# Patient Record
Sex: Male | Born: 1985 | Race: Black or African American | Hispanic: No | Smoking: Current some day smoker
Health system: Southern US, Community
[De-identification: ages and names within clinical notes are randomized; demographics above are authoritative.]

## PROBLEM LIST (undated history)

## (undated) ENCOUNTER — Emergency Department (HOSPITAL_COMMUNITY): Admission: EM | Payer: PRIVATE HEALTH INSURANCE | Source: Home / Self Care

## (undated) DIAGNOSIS — M549 Dorsalgia, unspecified: Secondary | ICD-10-CM

## (undated) DIAGNOSIS — J45909 Unspecified asthma, uncomplicated: Secondary | ICD-10-CM

## (undated) DIAGNOSIS — M542 Cervicalgia: Secondary | ICD-10-CM

## (undated) DIAGNOSIS — F99 Mental disorder, not otherwise specified: Secondary | ICD-10-CM

## (undated) DIAGNOSIS — M25562 Pain in left knee: Secondary | ICD-10-CM

## (undated) DIAGNOSIS — Z9889 Other specified postprocedural states: Secondary | ICD-10-CM

## (undated) DIAGNOSIS — F32A Depression, unspecified: Secondary | ICD-10-CM

## (undated) DIAGNOSIS — K219 Gastro-esophageal reflux disease without esophagitis: Secondary | ICD-10-CM

## (undated) DIAGNOSIS — Z720 Tobacco use: Secondary | ICD-10-CM

## (undated) DIAGNOSIS — N289 Disorder of kidney and ureter, unspecified: Secondary | ICD-10-CM

## (undated) DIAGNOSIS — F329 Major depressive disorder, single episode, unspecified: Secondary | ICD-10-CM

## (undated) DIAGNOSIS — N189 Chronic kidney disease, unspecified: Secondary | ICD-10-CM

## (undated) DIAGNOSIS — F1010 Alcohol abuse, uncomplicated: Secondary | ICD-10-CM

## (undated) DIAGNOSIS — R112 Nausea with vomiting, unspecified: Secondary | ICD-10-CM

## (undated) DIAGNOSIS — J189 Pneumonia, unspecified organism: Secondary | ICD-10-CM

## (undated) DIAGNOSIS — I10 Essential (primary) hypertension: Secondary | ICD-10-CM

## (undated) DIAGNOSIS — M1990 Unspecified osteoarthritis, unspecified site: Secondary | ICD-10-CM

## (undated) HISTORY — PX: OTHER SURGICAL HISTORY: SHX169

---
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
## 2006-10-21 ENCOUNTER — Emergency Department (HOSPITAL_COMMUNITY): Admission: EM | Admit: 2006-10-21 | Discharge: 2006-10-21 | Payer: Self-pay | Admitting: Emergency Medicine

## 2006-11-10 ENCOUNTER — Emergency Department (HOSPITAL_COMMUNITY): Admission: EM | Admit: 2006-11-10 | Discharge: 2006-11-10 | Payer: Self-pay | Admitting: Emergency Medicine

## 2008-04-02 ENCOUNTER — Ambulatory Visit: Payer: Self-pay | Admitting: Family Medicine

## 2008-04-02 ENCOUNTER — Encounter (INDEPENDENT_AMBULATORY_CARE_PROVIDER_SITE_OTHER): Payer: Self-pay | Admitting: Family Medicine

## 2008-04-02 DIAGNOSIS — F1010 Alcohol abuse, uncomplicated: Secondary | ICD-10-CM | POA: Insufficient documentation

## 2008-04-02 DIAGNOSIS — F329 Major depressive disorder, single episode, unspecified: Secondary | ICD-10-CM

## 2008-04-02 DIAGNOSIS — J45909 Unspecified asthma, uncomplicated: Secondary | ICD-10-CM

## 2008-04-02 DIAGNOSIS — I252 Old myocardial infarction: Secondary | ICD-10-CM

## 2008-04-02 LAB — CONVERTED CEMR LAB
ALT: 11 units/L (ref 0–53)
AST: 17 units/L (ref 0–37)
Albumin: 4.5 g/dL (ref 3.5–5.2)
Alkaline Phosphatase: 59 units/L (ref 39–117)
BUN: 10 mg/dL (ref 6–23)
CO2: 27 meq/L (ref 19–32)
Calcium: 10 mg/dL (ref 8.4–10.5)
Chloride: 103 meq/L (ref 96–112)
Cholesterol: 144 mg/dL (ref 0–200)
Creatinine, Ser: 0.84 mg/dL (ref 0.40–1.50)
Glucose, Bld: 79 mg/dL (ref 70–99)
HDL: 49 mg/dL (ref >39)
LDL Cholesterol: 83 mg/dL (ref 0–99)
Potassium: 3.8 meq/L (ref 3.5–5.3)
Sodium: 142 meq/L (ref 135–145)
Total Bilirubin: 1.1 mg/dL (ref 0.3–1.2)
Total CHOL/HDL Ratio: 2.9
Total Protein: 7.5 g/dL (ref 6.0–8.3)
Triglycerides: 62 mg/dL (ref <150)
VLDL: 12 mg/dL (ref 0–40)

## 2008-04-06 ENCOUNTER — Encounter (INDEPENDENT_AMBULATORY_CARE_PROVIDER_SITE_OTHER): Payer: Self-pay | Admitting: Family Medicine

## 2008-12-14 ENCOUNTER — Telehealth: Payer: Self-pay | Admitting: Family Medicine

## 2008-12-15 ENCOUNTER — Ambulatory Visit: Payer: Self-pay | Admitting: Family Medicine

## 2008-12-15 ENCOUNTER — Encounter: Payer: Self-pay | Admitting: Sports Medicine

## 2008-12-15 DIAGNOSIS — R609 Edema, unspecified: Secondary | ICD-10-CM

## 2008-12-15 LAB — CONVERTED CEMR LAB
ALT: 12 units/L (ref 0–53)
AST: 25 units/L (ref 0–37)
Albumin: 3.3 g/dL — ABNORMAL LOW (ref 3.5–5.2)
Alkaline Phosphatase: 58 U/L (ref 39–117)
BUN: 15 mg/dL (ref 6–23)
Basophils Absolute: 0 K/uL (ref 0.0–0.1)
Basophils Relative: 0 % (ref 0–1)
Bilirubin Urine: NEGATIVE
Blood in Urine, dipstick: NEGATIVE
CO2: 25 meq/L (ref 19–32)
Calcium: 9 mg/dL (ref 8.4–10.5)
Chloride: 106 meq/L (ref 96–112)
Creatinine, Ser: 1.53 mg/dL — ABNORMAL HIGH (ref 0.40–1.50)
Eosinophils Absolute: 0.1 10*3/uL (ref 0.0–0.7)
Eosinophils Relative: 1 % (ref 0–5)
Glucose, Bld: 79 mg/dL (ref 70–99)
Glucose, Urine, Semiquant: NEGATIVE
HCT: 38.7 % — ABNORMAL LOW (ref 39.0–52.0)
Hemoglobin: 13.1 g/dL (ref 13.0–17.0)
Ketones, urine, test strip: NEGATIVE
Lymphocytes Relative: 27 % (ref 12–46)
Lymphs Abs: 2.1 10*3/uL (ref 0.7–4.0)
MCHC: 33.9 g/dL (ref 30.0–36.0)
MCV: 89.4 fL (ref 78.0–100.0)
Monocytes Absolute: 0.8 K/uL (ref 0.1–1.0)
Monocytes Relative: 10 % (ref 3–12)
Neutro Abs: 4.7 K/uL (ref 1.7–7.7)
Neutrophils Relative %: 62 % (ref 43–77)
Nitrite: NEGATIVE
Platelets: 260 10*3/uL (ref 150–400)
Potassium: 4 meq/L (ref 3.5–5.3)
Protein, U semiquant: >=300
RBC: 4.33 M/uL (ref 4.22–5.81)
RDW: 14.2 % (ref 11.5–15.5)
Sodium: 142 meq/L (ref 135–145)
Specific Gravity, Urine: >=1.03
Total Bilirubin: 0.5 mg/dL (ref 0.3–1.2)
Total Protein: 6.2 g/dL (ref 6.0–8.3)
Urobilinogen, UA: 0.2
WBC Urine, dipstick: NEGATIVE
WBC: 7.6 10*3/uL (ref 4.0–10.5)
pH: 7

## 2008-12-18 ENCOUNTER — Encounter (INDEPENDENT_AMBULATORY_CARE_PROVIDER_SITE_OTHER): Payer: Self-pay | Admitting: *Deleted

## 2009-01-15 ENCOUNTER — Ambulatory Visit: Payer: Self-pay | Admitting: Family Medicine

## 2009-01-15 DIAGNOSIS — M549 Dorsalgia, unspecified: Secondary | ICD-10-CM | POA: Insufficient documentation

## 2009-01-15 DIAGNOSIS — M25569 Pain in unspecified knee: Secondary | ICD-10-CM

## 2009-04-21 ENCOUNTER — Other Ambulatory Visit: Payer: Self-pay | Admitting: Emergency Medicine

## 2009-04-21 ENCOUNTER — Emergency Department (HOSPITAL_COMMUNITY): Admission: EM | Admit: 2009-04-21 | Discharge: 2009-04-21 | Payer: Self-pay | Admitting: Emergency Medicine

## 2009-04-29 ENCOUNTER — Emergency Department (HOSPITAL_COMMUNITY): Admission: EM | Admit: 2009-04-29 | Discharge: 2009-04-29 | Payer: Self-pay | Admitting: Emergency Medicine

## 2009-05-07 ENCOUNTER — Emergency Department (HOSPITAL_COMMUNITY): Admission: EM | Admit: 2009-05-07 | Discharge: 2009-05-07 | Payer: Self-pay | Admitting: Emergency Medicine

## 2009-06-03 ENCOUNTER — Emergency Department (HOSPITAL_COMMUNITY): Admission: EM | Admit: 2009-06-03 | Discharge: 2009-06-03 | Payer: Self-pay | Admitting: Emergency Medicine

## 2009-06-07 ENCOUNTER — Encounter (INDEPENDENT_AMBULATORY_CARE_PROVIDER_SITE_OTHER): Payer: Self-pay | Admitting: *Deleted

## 2009-06-07 DIAGNOSIS — F17200 Nicotine dependence, unspecified, uncomplicated: Secondary | ICD-10-CM | POA: Insufficient documentation

## 2009-06-07 DIAGNOSIS — Z720 Tobacco use: Secondary | ICD-10-CM | POA: Insufficient documentation

## 2009-07-09 DIAGNOSIS — N179 Acute kidney failure, unspecified: Secondary | ICD-10-CM

## 2009-08-07 ENCOUNTER — Emergency Department (HOSPITAL_COMMUNITY): Admission: EM | Admit: 2009-08-07 | Discharge: 2009-08-07 | Payer: Self-pay | Admitting: Emergency Medicine

## 2009-08-30 ENCOUNTER — Emergency Department (HOSPITAL_COMMUNITY): Admission: EM | Admit: 2009-08-30 | Discharge: 2009-08-30 | Payer: Self-pay | Admitting: Emergency Medicine

## 2009-09-01 ENCOUNTER — Emergency Department (HOSPITAL_COMMUNITY): Admission: EM | Admit: 2009-09-01 | Discharge: 2009-09-01 | Payer: Self-pay | Admitting: Emergency Medicine

## 2009-11-14 ENCOUNTER — Emergency Department (HOSPITAL_COMMUNITY): Admission: EM | Admit: 2009-11-14 | Discharge: 2009-11-14 | Payer: Self-pay | Admitting: Emergency Medicine

## 2010-02-09 ENCOUNTER — Emergency Department (HOSPITAL_COMMUNITY): Admission: EM | Admit: 2010-02-09 | Discharge: 2010-02-09 | Payer: Self-pay | Admitting: Emergency Medicine

## 2010-07-05 NOTE — Assessment & Plan Note (Signed)
 Summary: swollen ankles & feet/[place]   Vital Signs:  Patient profile:   25 year old male Weight:      196.1 pounds Temp:     98.1 degrees F oral Pulse rate:   59 / minute BP sitting:   116 / 67  (left arm)  Vitals Entered By: Letitia Reusing (December 15, 2008 3:00 PM) CC: swoleen ankles,feet Is Patient Diabetic? No   Primary Care Provider:  Jeoffrey Anger MD  CC:  swoleen ankles and feet.  History of Present Illness: 54M with Schizophrenia here with 2 wks c/o LE swelling.  Started 2 weeks ago, painful swelling of feet and ankles.  Able to walk but limps, occasional fevers/chills per pt.  Worse with walking and palpation, better with resting, 5/10 severity, has not tried anything to help with the pain.  Walks about a mile to and from the store occasionally.  No hx trauma, no major changes in physical activity, no Hx STDs/Gonorrhea, Hx OA in the family but no RA per pt. No insect bites/ticks, no changes in urination, no N/V/D/C.  No Cough, ST.  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 smokes 5 cigs a day up to 30 a day.  Drinks wine occasionaly, liquor weekly, beer like water.  5 12 oz cans a day.  Sometimes 5 24 oz cans a day.  Tried to quit before but didn't work out.  Can drink 1/5 in day...does not do this all the time but if has money.   Used MJ by mistake in [place].  No other drugs Lives alone.  In a relationship.  2 children, lives w/ mother.    Social History: Smoking Status:  current Packs/Day:   0.5  Review of Systems       See HPI  Physical Exam  General:  Well-developed,well-nourished,in no acute distress; alert,appropriate and cooperative throughout examination Lungs:  Normal respiratory effort, chest expands symmetrically. Lungs are clear to auscultation, no crackles or wheezes. Heart:  Normal rate and regular rhythm. S1 and S2 normal without gallop, murmur, click, rub or other extra sounds. Abdomen:  Bowel sounds positive,abdomen soft and non-tender without masses, organomegaly or hernias noted. Extremities:  Swelling symmetric in both feet, extending up midway to knee, no erythema, mildly TTP, no point tenderness, no warmth, Pulses DP/PT palpable and 2+, sensation intact, edema pitting and 2+, no breaks in skin or scars noted.  No pain with palpation and passive motion in 1st MTP joints.   Impression & Recommendations:  Problem # 1:  LEG EDEMA, BILATERAL (ICD-782.3) Assessment New Zyprexa has been known to cause LE swelling.  Told pt that he needs to let his psychiatrist [facility] know that this is happening.  He agrees.  Also would r/o hypoalbuminemia, Proteinuria, infection.  Will check CMET, UA, CBC with diff.  Will try compression hose, knee high, and naproxen .  If no improvement in 2 weeks when pt returns will start rheumatologic workup with ESR, CRP, RF, etc.  Could also consider crystal arthropathies such as pseudogout and do Imaging/joint  fluid analysis.  Orders: Comp Met-[facility] [id number]) Urinalysis-[facility] (00000) CBC w/Diff-[facility] (14974) [facility]- Est  Level 4 (00785)  Complete Medication List: 1)  Naprosyn  250 Mg Tabs (Naproxen ) .... One tab by mouth bid 2)  Proventil  Hfa 108 (90 Base) Mcg/act Aers (Albuterol  sulfate) .SABRA.. 1-2 puff q 4-6 hrs as needed wheezing  Patient Instructions: 1)  Good to meet you today, 2)  I want you to go to the lab to have some bloodwork and urine studies checked.  Then I want you to go get some support hose (knee high) available at  any drug store.  You can also go to www.elastictherapy.com to order some support hose to help squeeze out the swelling.  Please go to your mental health provider and let them know about the swelling in your legs.  Zyprexa can often cause this symptom. 3)  Take naproxen  for the pain, I will send it to your pharmacy. 4)  Come back to see me in 2 weeks to re-evaluate the swelling. 5)  -Dr. ONEIDA. Prescriptions: NAPROSYN  250 MG TABS (NAPROXEN ) One tab by mouth BID  #90 x 0   Entered and Authorized by:   Debby Petties MD   Signed by:   Debby Petties MD on 12/15/2008   Method used:   Print then Give to Patient   RxID:   [id number]   Laboratory Results   Urine Tests  Date/Time Received: December 15, 2008 4:26 PM  Date/Time Reported: December 15, 2008 5:10 PM   Routine Urinalysis   Color: yellow Appearance: Clear Glucose: negative   (Normal Range: Negative) Bilirubin: negative   (Normal Range: Negative) Ketone: negative   (Normal Range: Negative) Spec. Gravity: >=1.030   (Normal Range: 1.003-1.035) Blood: negative   (Normal Range: Negative) pH: 7.0   (Normal Range: 5.0-8.0) Protein: >=300   (Normal Range: Negative) Urobilinogen: 0.2   (Normal Range: 0-1) Nitrite: negative   (Normal Range: Negative) Leukocyte Esterace: negative   (Normal Range: Negative)  Urine Microscopic WBC/HPF: 1-5 RBC/HPF: rare Bacteria/HPF: 1+ Epithelial/HPF: rare Casts/LPF: occ hyaline    Comments: ...........test performed by...........SABRAArland Morel, CMA      Appended Document: Orders Update >300 protein in Urinalysis along with casts, concerning for nephrotic syndrome.  Will have pt come back for 24h urine protein.  -Dr. ONEIDA.   Clinical Lists Changes  Orders: Added new Test order of 24hr. Urine TP- [facility] [id number]) - Signed      Appended Document: swollen ankles & feet/[place] If nephrotic range proteinuria, will check: ANA, complement levels, SPEP/UPEP, RPR, HepB, HepC,  cryoglobulins, ASO titers/DNAase. -Dr. ONEIDA.  Appended Document: swollen ankles & feet/[place] lmom to call back  Appended Document: swollen ankles & feet/ lmom on pt's voicemail and at mom's  phone number to call back.

## 2010-07-05 NOTE — Miscellaneous (Signed)
Summary: [name]  Clinical Lists Changes  Problems: Added new problem of [name] (ICD-305.1) 

## 2010-07-06 ENCOUNTER — Encounter: Payer: Self-pay | Admitting: *Deleted

## 2010-09-07 LAB — URINE MICROSCOPIC-ADD ON

## 2010-09-07 LAB — CBC
MCHC: 34.6 g/dL (ref 30.0–36.0)
Platelets: 232 10*3/uL (ref 150–400)
RDW: 13.9 % (ref 11.5–15.5)

## 2010-09-07 LAB — POCT I-STAT, CHEM 8
Calcium, Ion: 1.16 mmol/L (ref 1.12–1.32)
Chloride: 107 mEq/L (ref 96–112)
HCT: 36 % — ABNORMAL LOW (ref 39.0–52.0)
Potassium: 3.1 mEq/L — ABNORMAL LOW (ref 3.5–5.1)
Sodium: 141 mEq/L (ref 135–145)

## 2010-09-07 LAB — RAPID URINE DRUG SCREEN, HOSP PERFORMED
Amphetamines: NOT DETECTED
Benzodiazepines: NOT DETECTED
Tetrahydrocannabinol: POSITIVE — AB

## 2010-09-07 LAB — DIFFERENTIAL
Basophils Absolute: 0 10*3/uL (ref 0.0–0.1)
Basophils Relative: 1 % (ref 0–1)
Lymphocytes Relative: 42 % (ref 12–46)
Monocytes Absolute: 0.4 10*3/uL (ref 0.1–1.0)
Neutro Abs: 2.5 10*3/uL (ref 1.7–7.7)
Neutrophils Relative %: 47 % (ref 43–77)

## 2010-09-07 LAB — URINALYSIS, ROUTINE W REFLEX MICROSCOPIC
Ketones, ur: NEGATIVE mg/dL
Leukocytes, UA: NEGATIVE
Nitrite: NEGATIVE
pH: 6.5 (ref 5.0–8.0)

## 2010-09-07 LAB — ETHANOL: Alcohol, Ethyl (B): <5 mg/dL (ref 0–10)

## 2011-03-13 ENCOUNTER — Emergency Department (HOSPITAL_COMMUNITY)
Admission: EM | Admit: 2011-03-13 | Discharge: 2011-03-13 | Disposition: A | Discharge status: Home / Self Care | Payer: Medicaid Other | Attending: Emergency Medicine | Admitting: Emergency Medicine

## 2011-03-13 DIAGNOSIS — Z79899 Other long term (current) drug therapy: Secondary | ICD-10-CM | POA: Insufficient documentation

## 2011-03-13 DIAGNOSIS — M79609 Pain in unspecified limb: Secondary | ICD-10-CM | POA: Insufficient documentation

## 2011-03-13 DIAGNOSIS — I10 Essential (primary) hypertension: Secondary | ICD-10-CM | POA: Insufficient documentation

## 2011-06-04 ENCOUNTER — Emergency Department (HOSPITAL_COMMUNITY)
Admission: EM | Admit: 2011-06-04 | Discharge: 2011-06-04 | Disposition: A | Discharge status: Home / Self Care | Payer: No Typology Code available for payment source | Attending: Emergency Medicine | Admitting: Emergency Medicine

## 2011-06-04 ENCOUNTER — Encounter: Payer: Self-pay | Admitting: Family Medicine

## 2011-06-04 DIAGNOSIS — T148XXA Other injury of unspecified body region, initial encounter: Secondary | ICD-10-CM | POA: Insufficient documentation

## 2011-06-04 DIAGNOSIS — M542 Cervicalgia: Secondary | ICD-10-CM | POA: Insufficient documentation

## 2011-06-04 DIAGNOSIS — M25559 Pain in unspecified hip: Secondary | ICD-10-CM | POA: Insufficient documentation

## 2011-06-04 DIAGNOSIS — M549 Dorsalgia, unspecified: Secondary | ICD-10-CM | POA: Insufficient documentation

## 2011-06-04 MED ORDER — IBUPROFEN 800 MG PO TABS: 800.0000 mg | ORAL_TABLET | Freq: Three times a day (TID) | ORAL | Status: AC

## 2011-06-04 MED ORDER — OXYCODONE-ACETAMINOPHEN 5-325 MG PO TABS
2.0000 | ORAL_TABLET | Freq: Once | ORAL | Status: AC
  Administered 2011-06-04: 2 via ORAL
  Filled 2011-06-04: qty 2

## 2011-06-04 MED ORDER — METHOCARBAMOL 500 MG PO TABS: 500.0000 mg | ORAL_TABLET | Freq: Two times a day (BID) | ORAL | Status: AC

## 2011-06-04 MED ORDER — KETOROLAC TROMETHAMINE 60 MG/2ML IM SOLN
60.0000 mg | Freq: Once | INTRAMUSCULAR | Status: AC
  Administered 2011-06-04: 60 mg via INTRAMUSCULAR
  Filled 2011-06-04: qty 2

## 2011-06-04 MED ORDER — ACETAMINOPHEN-CODEINE #3 300-30 MG PO TABS: 1.0000 | ORAL_TABLET | Freq: Four times a day (QID) | ORAL | Status: AC | PRN

## 2011-06-04 NOTE — ED Provider Notes (Signed)
Medical screening examination/treatment/procedure(s) were performed by non-physician practitioner and as supervising physician I was immediately available for consultation/collaboration.  Chauncy Passy, MD 06/04/11 2215
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
## 2011-09-17 ENCOUNTER — Encounter (HOSPITAL_COMMUNITY): Payer: Self-pay | Admitting: *Deleted

## 2011-09-17 ENCOUNTER — Emergency Department (HOSPITAL_COMMUNITY)
Admission: EM | Admit: 2011-09-17 | Discharge: 2011-09-17 | Disposition: A | Discharge status: Home / Self Care | Payer: Medicaid Other | Attending: Emergency Medicine | Admitting: Emergency Medicine

## 2011-09-17 DIAGNOSIS — M549 Dorsalgia, unspecified: Secondary | ICD-10-CM | POA: Insufficient documentation

## 2011-09-17 DIAGNOSIS — K0889 Other specified disorders of teeth and supporting structures: Secondary | ICD-10-CM

## 2011-09-17 DIAGNOSIS — R220 Localized swelling, mass and lump, head: Secondary | ICD-10-CM | POA: Insufficient documentation

## 2011-09-17 DIAGNOSIS — K089 Disorder of teeth and supporting structures, unspecified: Secondary | ICD-10-CM | POA: Insufficient documentation

## 2011-09-17 MED ORDER — OXYCODONE-ACETAMINOPHEN 5-325 MG PO TABS
1.0000 | ORAL_TABLET | Freq: Once | ORAL | Status: AC
  Administered 2011-09-17: 1 via ORAL
  Filled 2011-09-17: qty 1

## 2011-09-17 MED ORDER — PENICILLIN V POTASSIUM 500 MG PO TABS: 500.0000 mg | ORAL_TABLET | Freq: Four times a day (QID) | ORAL | Status: AC

## 2011-09-17 MED ORDER — OXYCODONE-ACETAMINOPHEN 5-325 MG PO TABS: 1.0000 | ORAL_TABLET | Freq: Four times a day (QID) | ORAL | Status: AC | PRN

## 2011-09-17 NOTE — Discharge Instructions (Signed)
You have a dental injury. Use the resource guide listed below to help you find a dentist if you do not already have one to followup with. It is very important that you get evaluated by a dentist as soon as possible. Call tomorrow to schedule an appointment. Use your pain medication as prescribed and do not operate heavy machinery while on pain medication. Note that your pain medication contains acetaminophen (Tylenol) & its is not reccommended that you use additional acetaminophen (Tylenol) while taking this medication. Take your full course of antibiotics. Read the instructions below.  Eat a soft or liquid diet and rinse your mouth out after meals with warm water. You should see a dentist or return here at once if you have increased swelling, increased pain or uncontrolled bleeding from the site of your injury.   SEEK MEDICAL CARE IF:   You have increased pain not controlled with medicines.   You have swelling around your tooth, in your face or neck.   You have bleeding which starts, continues, or gets worse.   You have a fever >101  If you are unable to open your mouth  RESOURCE GUIDE  Dental Problems  Patients with Medicaid: [address] [place] Phone:  [phone]                                                  Phone:  [phone]  If unable to pay or uninsured, contact:  [facility] or [facility]. to become qualified for the adult dental clinic.  Chronic Pain Problems Contact [facility]  [phone] Patients need to be referred by their primary care doctor.  Insufficient Money for Medicine Contact [employer]:  call "211" or [place] [phone].  No Primary Care Doctor Call [facility]  [phone] Other agencies that provide inexpensive medical care    [place]  [phone]    [facility]  Internal Medicine  [place]  [phone]    [facility]  [phone]    [facility]  [place]  [place]  [phone] [place]   [phone] (emergency services [phone])  Substance Abuse Resources [facility]  [phone] [facility] [phone] [facility] [place] [phone] [place] Pester [phone] Residential & Outpatient Substance Abuse Program  [phone]  Abuse/Neglect [place] [phone] [place] [phone] (After Hours)  Emergency [place] [phone]  [place] at the [place] [phone] [place] [phone]  MRSA Hotline #:   (941)697-6863    [facility]  [facility] Dept. [address]      [place] Phone:  [phone]                                   Phone:  [phone]                 Phone:  [place] Phone:  [place] [phone] (After Hours)  Back Exercises Back exercises help treat and prevent back injuries. The goal of back exercises is to increase the strength of your abdominal and back muscles and the flexibility of your back. These exercises should be started when you no longer have back pain. Back exercises include:  Pelvic Tilt. Lie on your back with your knees bent. Tilt your pelvis until the lower part of your back is against the floor. Hold this position 5 to 10 sec and repeat 5 to 10  times.   Knee to Chest. Pull first 1 knee up against your chest and hold for 20 to 30 seconds, repeat this with the other knee, and then both knees. This may be done with the other leg straight or bent, whichever feels better.   Sit-Ups or Curl-Ups. Bend your knees 90 degrees. Start with tilting your pelvis, and do a partial, slow sit-up, lifting your trunk only 30 to 45 degrees off the floor. Take at least 2 to 3 seconds for each sit-up. Do not do sit-ups with your knees out straight. If partial sit-ups are difficult, simply do the above but with only tightening your abdominal muscles and holding it as directed.   Hip-Lift. Lie on your back with your knees flexed 90 degrees. Push down with your feet and shoulders as you raise your hips a couple inches off the floor; hold for 10 seconds, repeat 5 to 10 times.   Back arches. Lie on your stomach, propping yourself up on bent elbows. Slowly press on your hands, causing an arch in your low back. Repeat 3 to 5 times. Any initial stiffness and discomfort should lessen with repetition over time.   Shoulder-Lifts. Lie face down with arms beside your body. Keep hips and torso pressed to floor as you slowly lift your head and shoulders off the floor.  Do not overdo your exercises, especially in the beginning. Exercises may cause you some mild back discomfort which lasts for a few minutes; however, if the pain is more severe, or lasts for more than 15 minutes, do not continue exercises until you see your caregiver. Improvement with exercise therapy for back problems is slow.  See your caregivers for assistance with developing a proper back exercise program. Document Released: 06/29/2004 Document Revised: 05/11/2011 Document Reviewed: 05/22/2005 [facility] Patient Information 2012 [place].

## 2011-09-17 NOTE — ED Notes (Signed)
Reports right side upper dental abscess. Airway is intact.
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
## 2011-09-30 NOTE — ED Provider Notes (Signed)
Evaluation and management procedures were performed by the PA/NP/resident physician under my supervision/collaboration.   Charlena Cross, MD 09/30/11 2024

## 2011-10-26 ENCOUNTER — Emergency Department (HOSPITAL_COMMUNITY)
Admission: EM | Admit: 2011-10-26 | Discharge: 2011-10-26 | Disposition: A | Discharge status: Home / Self Care | Payer: Medicaid Other | Attending: Emergency Medicine | Admitting: Emergency Medicine

## 2011-10-26 ENCOUNTER — Encounter (HOSPITAL_COMMUNITY): Payer: Self-pay | Admitting: Emergency Medicine

## 2011-10-26 ENCOUNTER — Emergency Department (HOSPITAL_COMMUNITY): Payer: Medicaid Other

## 2011-10-26 DIAGNOSIS — S7002XA Contusion of left hip, initial encounter: Secondary | ICD-10-CM

## 2011-10-26 DIAGNOSIS — S8000XA Contusion of unspecified knee, initial encounter: Secondary | ICD-10-CM | POA: Insufficient documentation

## 2011-10-26 DIAGNOSIS — M25559 Pain in unspecified hip: Secondary | ICD-10-CM | POA: Insufficient documentation

## 2011-10-26 DIAGNOSIS — S7000XA Contusion of unspecified hip, initial encounter: Secondary | ICD-10-CM | POA: Insufficient documentation

## 2011-10-26 DIAGNOSIS — S8002XA Contusion of left knee, initial encounter: Secondary | ICD-10-CM

## 2011-10-26 DIAGNOSIS — M25569 Pain in unspecified knee: Secondary | ICD-10-CM | POA: Insufficient documentation

## 2011-10-26 HISTORY — DX: Mental disorder, not otherwise specified: F99

## 2011-10-26 HISTORY — DX: Pain in left knee: M25.562

## 2011-10-26 MED ORDER — TRAMADOL HCL 50 MG PO TABS: 50.0000 mg | ORAL_TABLET | Freq: Four times a day (QID) | ORAL | Status: AC | PRN

## 2011-10-26 MED ORDER — TRAMADOL HCL 50 MG PO TABS
50.0000 mg | ORAL_TABLET | Freq: Once | ORAL | Status: AC
  Administered 2011-10-26: 50 mg via ORAL
  Filled 2011-10-26: qty 1

## 2011-10-26 NOTE — Discharge Instructions (Signed)
Contusion A contusion is a deep bruise. Contusions are the result of an injury that caused bleeding under the skin. The contusion may turn blue, purple, or yellow. Minor injuries will give you a painless contusion, but more severe contusions may stay painful and swollen for a few weeks.  CAUSES  A contusion is usually caused by a blow, trauma, or direct force to an area of the body. SYMPTOMS   Swelling and redness of the injured area.   Bruising of the injured area.   Tenderness and soreness of the injured area.   Pain.  DIAGNOSIS  The diagnosis can be made by taking a history and physical exam. An X-ray, CT scan, or MRI may be needed to determine if there were any associated injuries, such as fractures. TREATMENT  Specific treatment will depend on what area of the body was injured. In general, the best treatment for a contusion is resting, icing, elevating, and applying cold compresses to the injured area. Over-the-counter medicines may also be recommended for pain control. Ask your caregiver what the best treatment is for your contusion. HOME CARE INSTRUCTIONS   Put ice on the injured area.   Put ice in a plastic bag.   Place a towel between your skin and the bag.   Leave the ice on for 15 to 20 minutes, 3 to 4 times a day.   Only take over-the-counter or prescription medicines for pain, discomfort, or fever as directed by your caregiver. Your caregiver may recommend avoiding anti-inflammatory medicines (aspirin, ibuprofen, and naproxen) for 48 hours because these medicines may increase bruising.   Rest the injured area.   If possible, elevate the injured area to reduce swelling.  SEEK IMMEDIATE MEDICAL CARE IF:   You have increased bruising or swelling.   You have pain that is getting worse.   Your swelling or pain is not relieved with medicines.  MAKE SURE YOU:   Understand these instructions.   Will watch your condition.   Will get help right away if you are not  doing well or get worse.  Document Released: 03/01/2005 Document Revised: 05/11/2011 Document Reviewed: 03/27/2011 [facility] Patient Information 2012 [place], [place].Hip Injury You have a been diagnosed with a hip injury. Falls can cause fractures to the pelvis and hip as well as very painful bruises. These are called 'hip pointers'. Muscle injuries, arthritis, sciatica, and bursitis and can also cause severe pelvic or hip pain. An x-ray exam will usually show a fractured bone, but sometimes other studies like a CT scan or MRI may be needed to be certain about the diagnosis. All painful hip injuries should be treated like there might be a fracture until they are better or determined not to be fractures. Rest in bed over the next 3-4 days, or as long as you have severe pain. You should not bear weight on your injured hip. Use crutches or a walker. Ice packs can be applied to the injury site for 20 minutes every 2-4 hours for several days. Pain medicine may be needed to help you rest, especially at night.  A follow-up exam and further studies to determine the cause of your pain are important if your symptoms do not improve rapidly over the next week.  SEEK IMMEDIATE MEDICAL CARE IF:  You re-injure your hip.   You develop more pain, a fever, inability to walk, or other problems.  MAKE SURE YOU:   Understand these instructions.   Will watch your condition.   Will get help  right away if you are not doing well or get worse.  Document Released: 06/29/2004 Document Revised: 05/11/2011 Document Reviewed: 09/10/2008 [facility] Patient Information 2012 [place].
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
## 2011-10-27 NOTE — ED Provider Notes (Signed)
Medical screening examination/treatment/procedure(s) were performed by non-physician practitioner and as supervising physician I was immediately available for consultation/collaboration.  Carmin Muskrat, MD 10/27/11 0005

## 2012-05-11 ENCOUNTER — Encounter (HOSPITAL_COMMUNITY): Payer: Self-pay | Admitting: *Deleted

## 2012-05-11 ENCOUNTER — Emergency Department (HOSPITAL_COMMUNITY)
Admission: EM | Admit: 2012-05-11 | Discharge: 2012-05-11 | Disposition: A | Discharge status: Home / Self Care | Payer: Medicaid Other | Attending: Emergency Medicine | Admitting: Emergency Medicine

## 2012-05-11 DIAGNOSIS — J45909 Unspecified asthma, uncomplicated: Secondary | ICD-10-CM | POA: Insufficient documentation

## 2012-05-11 DIAGNOSIS — F489 Nonpsychotic mental disorder, unspecified: Secondary | ICD-10-CM | POA: Insufficient documentation

## 2012-05-11 DIAGNOSIS — H538 Other visual disturbances: Secondary | ICD-10-CM | POA: Insufficient documentation

## 2012-05-11 DIAGNOSIS — R110 Nausea: Secondary | ICD-10-CM | POA: Insufficient documentation

## 2012-05-11 DIAGNOSIS — G43909 Migraine, unspecified, not intractable, without status migrainosus: Secondary | ICD-10-CM | POA: Insufficient documentation

## 2012-05-11 DIAGNOSIS — Z79899 Other long term (current) drug therapy: Secondary | ICD-10-CM | POA: Insufficient documentation

## 2012-05-11 HISTORY — DX: Unspecified asthma, uncomplicated: J45.909

## 2012-05-11 MED ORDER — PROMETHAZINE HCL 25 MG/ML IJ SOLN
25.0000 mg | Freq: Once | INTRAMUSCULAR | Status: AC
  Administered 2012-05-11: 25 mg via INTRAMUSCULAR
  Filled 2012-05-11 (×2): qty 1

## 2012-05-11 MED ORDER — METOCLOPRAMIDE HCL 10 MG PO TABS: 10.0000 mg | ORAL_TABLET | Freq: Four times a day (QID) | ORAL | Status: DC | PRN

## 2012-05-11 MED ORDER — KETOROLAC TROMETHAMINE 60 MG/2ML IM SOLN
60.0000 mg | Freq: Once | INTRAMUSCULAR | Status: AC
  Administered 2012-05-11: 60 mg via INTRAMUSCULAR
  Filled 2012-05-11: qty 2

## 2012-05-11 NOTE — ED Notes (Signed)
Pt came out to nurses station stating "I need to leave. This ain't right. I have to get going. You all need to do something. I'm going to walk up out of here".

## 2012-05-11 NOTE — ED Notes (Signed)
Pt. Reports sharp pains/migraine for x1 week. States it has eased off today but is still there. Hx of same. States he saw an eye doctor 2-3 years ago who said he needed eyeglasses but can not afford them.

## 2012-05-11 NOTE — ED Notes (Signed)
The pt has had a headache for one week no nv  Some diarrhea.  The pts headache is actually better today

## 2012-05-11 NOTE — ED Provider Notes (Addendum)
History     CSN: [id number]  Arrival date & time 05/11/12  1724   First MD Initiated Contact with Patient 05/11/12 2053      Chief Complaint  Patient presents with  . Headache    (Consider location/radiation/quality/duration/timing/severity/associated sxs/prior treatment) The history is provided by the patient.  William Gregory is a 26 y.o. male hx of migraines here with headache. Intermittent headache for a week, between 5/10 to 7/10 pain. Usually diffuse headache. + nausea no vomiting. No abdominal pain. Took some alleve and felt slightly better. No trauma no hx of aneurysm. Similar to his usual migraines. He also has chronic blurry vision and is supposed to get glasses. Blurry vision not worsening.     Past Medical History  Diagnosis Date  . Knee joint pain, left   . Psychiatric problem     unspecified by patient (receives "injections" bi-weekly)  . Asthma     History reviewed. No pertinent past surgical history.  No family history on file.  History  Substance Use Topics  . Smoking status: Not on file  . Smokeless tobacco: Not on file  . Alcohol Use: No      Review of Systems  Gastrointestinal: Positive for nausea.  Neurological: Positive for headaches.  All other systems reviewed and are negative.    Allergies  Review of patient's allergies indicates no known allergies.  Home Medications   Current Outpatient Rx  Name  Route  Sig  Dispense  Refill  . ALBUTEROL SULFATE HFA 108 (90 BASE) MCG/ACT IN AERS   Inhalation   Inhale 2 puffs into the lungs every 4 (four) hours as needed. 1-2 puff every 4-6 hours as needed wheezing         . RISPERIDONE 0.25 MG PO TABS   Oral   Take 0.25 mg by mouth daily.           BP 139/79  Pulse 84  Temp 98.4 F (36.9 C) (Oral)  Resp 20  SpO2 95%  Physical Exam  Nursing note and vitals reviewed. Constitutional: He is oriented to person, place, and time. He appears well-developed and well-nourished.   Slightly uncomfortable   HENT:  Head: Normocephalic and atraumatic.  Mouth/Throat: Oropharynx is clear and moist.  Eyes: Conjunctivae normal are normal. Pupils are equal, round, and reactive to light.  Neck: Normal range of motion. Neck supple.  Cardiovascular: Normal rate, regular rhythm and normal heart sounds.   Pulmonary/Chest: Effort normal and breath sounds normal. No respiratory distress. He has no wheezes. He has no rales.  Abdominal: Soft. Bowel sounds are normal. He exhibits no distension. There is no tenderness. There is no rebound.  Musculoskeletal: Normal range of motion. He exhibits no edema.  Neurological: He is alert and oriented to person, place, and time. No cranial nerve deficit. Coordination normal.       Nl strength and sensation throughout  Skin: Skin is warm and dry.  Psychiatric: He has a normal mood and affect. His behavior is normal. Judgment and thought content normal.    ED Course  Procedures (including critical care time)  Labs Reviewed - No data to display No results found.   No diagnosis found.    MDM  William Gregory is a 26 y.o. male here with headache. Likely migraines. No concerning features for [facility]. He was given reglan and toradol and felt better. Will d/c home with neuro f/u.          Wandra Arthurs, MD 05/11/12  2246  Wandra Arthurs, MD 05/11/12 2248

## 2013-03-13 ENCOUNTER — Emergency Department (HOSPITAL_COMMUNITY)
Admission: EM | Admit: 2013-03-13 | Discharge: 2013-03-13 | Discharge status: Against Medical Advice | Payer: No Typology Code available for payment source

## 2014-11-03 ENCOUNTER — Emergency Department (HOSPITAL_COMMUNITY)
Admission: EM | Admit: 2014-11-03 | Discharge: 2014-11-03 | Disposition: A | Discharge status: Home / Self Care | Payer: Medicaid Other | Attending: Emergency Medicine | Admitting: Emergency Medicine

## 2014-11-03 ENCOUNTER — Encounter (HOSPITAL_COMMUNITY): Payer: Self-pay | Admitting: Emergency Medicine

## 2014-11-03 ENCOUNTER — Emergency Department (HOSPITAL_COMMUNITY): Payer: Medicaid Other

## 2014-11-03 DIAGNOSIS — Z760 Encounter for issue of repeat prescription: Secondary | ICD-10-CM | POA: Diagnosis not present

## 2014-11-03 DIAGNOSIS — J069 Acute upper respiratory infection, unspecified: Secondary | ICD-10-CM | POA: Diagnosis not present

## 2014-11-03 DIAGNOSIS — J029 Acute pharyngitis, unspecified: Secondary | ICD-10-CM | POA: Diagnosis present

## 2014-11-03 DIAGNOSIS — J45901 Unspecified asthma with (acute) exacerbation: Secondary | ICD-10-CM | POA: Insufficient documentation

## 2014-11-03 DIAGNOSIS — Z8659 Personal history of other mental and behavioral disorders: Secondary | ICD-10-CM | POA: Insufficient documentation

## 2014-11-03 DIAGNOSIS — Z79899 Other long term (current) drug therapy: Secondary | ICD-10-CM | POA: Diagnosis not present

## 2014-11-03 DIAGNOSIS — R059 Cough, unspecified: Secondary | ICD-10-CM

## 2014-11-03 DIAGNOSIS — Z720 Tobacco use: Secondary | ICD-10-CM | POA: Insufficient documentation

## 2014-11-03 DIAGNOSIS — R05 Cough: Secondary | ICD-10-CM

## 2014-11-03 DIAGNOSIS — F17200 Nicotine dependence, unspecified, uncomplicated: Secondary | ICD-10-CM

## 2014-11-03 MED ORDER — ALBUTEROL SULFATE (2.5 MG/3ML) 0.083% IN NEBU
5.0000 mg | INHALATION_SOLUTION | Freq: Once | RESPIRATORY_TRACT | Status: AC
  Administered 2014-11-03: 5 mg via RESPIRATORY_TRACT
  Filled 2014-11-03: qty 6

## 2014-11-03 MED ORDER — ALBUTEROL SULFATE HFA 108 (90 BASE) MCG/ACT IN AERS: 2.0000 | INHALATION_SPRAY | RESPIRATORY_TRACT | Status: DC | PRN

## 2014-11-03 MED ORDER — PREDNISONE 20 MG PO TABS
60.0000 mg | ORAL_TABLET | Freq: Once | ORAL | Status: AC
  Administered 2014-11-03: 60 mg via ORAL
  Filled 2014-11-03: qty 3

## 2014-11-03 MED ORDER — IPRATROPIUM BROMIDE 0.02 % IN SOLN
0.5000 mg | Freq: Once | RESPIRATORY_TRACT | Status: AC
  Administered 2014-11-03: 0.5 mg via RESPIRATORY_TRACT
  Filled 2014-11-03: qty 2.5

## 2014-11-03 MED ORDER — ALBUTEROL SULFATE HFA 108 (90 BASE) MCG/ACT IN AERS
2.0000 | INHALATION_SPRAY | Freq: Once | RESPIRATORY_TRACT | Status: AC
  Administered 2014-11-03: 2 via RESPIRATORY_TRACT
  Filled 2014-11-03: qty 6.7

## 2014-11-03 MED ORDER — FLUTICASONE PROPIONATE 50 MCG/ACT NA SUSP: 2.0000 | Freq: Every day | NASAL | Status: DC

## 2014-11-03 MED ORDER — PREDNISONE 20 MG PO TABS: ORAL_TABLET | ORAL | Status: DC

## 2014-11-03 NOTE — Discharge Instructions (Signed)
Continue to stay well-hydrated. Gargle warm salt water and spit it out. Continue to alternate between Tylenol and Ibuprofen for pain or fever. Use Mucinex for cough suppression/expectoration of mucus. Use netipot and flonase to help with nasal congestion. May consider over-the-counter Benadryl or other antihistamine to decrease secretions and for watery itchy eyes. Use inhaler as directed, as needed for cough/chest congestion. STOP SMOKING! Followup with your primary care doctor in 5-7 days for recheck of ongoing symptoms. Return to emergency department for emergent changing or worsening of symptoms.   Asthma Asthma is a condition of the lungs in which the airways tighten and narrow. Asthma can make it hard to breathe. Asthma cannot be cured, but medicine and lifestyle changes can help control it. Asthma may be started (triggered) by:  Animal skin flakes (dander).  Dust.  Cockroaches.  Pollen.  Mold.  Smoke.  Cleaning products.  Hair sprays or aerosol sprays.  Paint fumes or strong smells.  Cold air, weather changes, and winds.  Crying or laughing hard.  Stress.  Certain medicines or drugs.  Foods, such as dried fruit, potato chips, and sparkling grape juice.  Infections or conditions (colds, flu).  Exercise.  Certain medical conditions or diseases.  Exercise or tiring activities. HOME CARE   Take medicine as told by your doctor.  Use a peak flow meter as told by your doctor. A peak flow meter is a tool that measures how well the lungs are working.  Record and keep track of the peak flow meter's readings.  Understand and use the asthma action plan. An asthma action plan is a written plan for taking care of your asthma and treating your attacks.  To help prevent asthma attacks:  Do not smoke. Stay away from secondhand smoke.  Change your heating and air conditioning filter often.  Limit your use of fireplaces and wood stoves.  Get rid of pests (such as  roaches and mice) and their droppings.  Throw away plants if you see mold on them.  Clean your floors. Dust regularly. Use cleaning products that do not smell.  Have someone vacuum when you are not home. Use a vacuum cleaner with a HEPA filter if possible.  Replace carpet with wood, tile, or vinyl flooring. Carpet can trap animal skin flakes and dust.  Use allergy-proof pillows, mattress covers, and box spring covers.  Wash bed sheets and blankets every week in hot water and dry them in a dryer.  Use blankets that are made of polyester or cotton.  Clean bathrooms and kitchens with bleach. If possible, have someone repaint the walls in these rooms with mold-resistant paint. Keep out of the rooms that are being cleaned and painted.  Wash hands often. GET HELP IF:  You have make a whistling sound when breaking (wheeze), have shortness of breath, or have a cough even if taking medicine to prevent attacks.  The colored mucus you cough up (sputum) is thicker than usual.  The colored mucus you cough up changes from clear or white to yellow, green, gray, or bloody.  You have problems from the medicine you are taking such as:  A rash.  Itching.  Swelling.  Trouble breathing.  You need reliever medicines more than 2-3 times a week.  Your peak flow measurement is still at 50-79% of your personal best after following the action plan for 1 hour.  You have a fever. GET HELP RIGHT AWAY IF:   You seem to be worse and are not responding to medicine  during an asthma attack.  You are short of breath even at rest.  You get short of breath when doing very little activity.  You have trouble eating, drinking, or talking.  You have chest pain.  You have a fast heartbeat.  Your lips or fingernails start to turn blue.  You are light-headed, dizzy, or faint.  Your peak flow is less than 50% of your personal best. MAKE SURE YOU:   Understand these instructions.  Will watch your  condition.  Will get help right away if you are not doing well or get worse. Document Released: 11/08/2007 Document Revised: 10/06/2013 Document Reviewed: 12/19/2012 [facility] Patient Information 2015 [place], [place]. This information is not intended to replace advice given to you by your health care provider. Make sure you discuss any questions you have with your health care provider.  How to Use an Inhaler Using your inhaler correctly is very important. Good technique will make sure that the medicine reaches your lungs.  HOW TO USE AN INHALER:  Take the cap off the inhaler.  If this is the first time using your inhaler, you need to prime it. Shake the inhaler for 5 seconds. Release four puffs into the air, away from your face. Ask your doctor for help if you have questions.  Shake the inhaler for 5 seconds.  Turn the inhaler so the bottle is above the mouthpiece.  Put your pointer finger on top of the bottle. Your thumb holds the bottom of the inhaler.  Open your mouth.  Either hold the inhaler away from your mouth (the width of 2 fingers) or place your lips tightly around the mouthpiece. Ask your doctor which way to use your inhaler.  Breathe out as much air as possible.  Breathe in and push down on the bottle 1 time to release the medicine. You will feel the medicine go in your mouth and throat.  Continue to take a deep breath in very slowly. Try to fill your lungs.  After you have breathed in completely, hold your breath for 10 seconds. This will help the medicine to settle in your lungs. If you cannot hold your breath for 10 seconds, hold it for as long as you can before you breathe out.  Breathe out slowly, through pursed lips. Whistling is an example of pursed lips.  If your doctor has told you to take more than 1 puff, wait at least 15-30 seconds between puffs. This will help you get the best results from your medicine. Do not use the inhaler more than your doctor tells you  to.  Put the cap back on the inhaler.  Follow the directions from your doctor or from the inhaler package about cleaning the inhaler. If you use more than one inhaler, ask your doctor which inhalers to use and what order to use them in. Ask your doctor to help you figure out when you will need to refill your inhaler.  If you use a steroid inhaler, always rinse your mouth with water after your last puff, gargle and spit out the water. Do not swallow the water. GET HELP IF:  The inhaler medicine only partially helps to stop wheezing or shortness of breath.  You are having trouble using your inhaler.  You have some increase in thick spit (phlegm). GET HELP RIGHT AWAY IF:  The inhaler medicine does not help your wheezing or shortness of breath or you have tightness in your chest.  You have dizziness, headaches, or fast heart rate.  You have chills, fever, or night sweats.  You have a large increase of thick spit, or your thick spit is bloody. MAKE SURE YOU:   Understand these instructions.  Will watch your condition.  Will get help right away if you are not doing well or get worse. Document Released: 02/29/2008 Document Revised: 03/12/2013 Document Reviewed: 12/19/2012 [facility] Patient Information 2015 [place], [place]. This information is not intended to replace advice given to you by your health care provider. Make sure you discuss any questions you have with your health care provider.  Smoking Cessation, Tips for Success If you are ready to quit smoking, congratulations! You have chosen to help yourself be healthier. Cigarettes bring nicotine, tar, carbon monoxide, and other irritants into your body. Your lungs, heart, and blood vessels will be able to work better without these poisons. There are many different ways to quit smoking. Nicotine gum, nicotine patches, a nicotine inhaler, or nicotine nasal spray can help with physical craving. Hypnosis, support groups, and medicines help  break the habit of smoking. WHAT THINGS CAN I DO TO MAKE QUITTING EASIER?  Here are some tips to help you quit for good:  Pick a date when you will quit smoking completely. Tell all of your friends and family about your plan to quit on that date.  Do not try to slowly cut down on the number of cigarettes you are smoking. Pick a quit date and quit smoking completely starting on that day.  Throw away all cigarettes.   Clean and remove all ashtrays from your home, work, and car.  On a card, write down your reasons for quitting. Carry the card with you and read it when you get the urge to smoke.  Cleanse your body of nicotine. Drink enough water and fluids to keep your urine clear or pale yellow. Do this after quitting to flush the nicotine from your body.  Learn to predict your moods. Do not let a bad situation be your excuse to have a cigarette. Some situations in your life might tempt you into wanting a cigarette.  Never have "just one" cigarette. It leads to wanting another and another. Remind yourself of your decision to quit.  Change habits associated with smoking. If you smoked while driving or when feeling stressed, try other activities to replace smoking. Stand up when drinking your coffee. Brush your teeth after eating. Sit in a different chair when you read the paper. Avoid alcohol while trying to quit, and try to drink fewer caffeinated beverages. Alcohol and caffeine may urge you to smoke.  Avoid foods and drinks that can trigger a desire to smoke, such as sugary or spicy foods and alcohol.  Ask people who smoke not to smoke around you.  Have something planned to do right after eating or having a cup of coffee. For example, plan to take a walk or exercise.  Try a relaxation exercise to calm you down and decrease your stress. Remember, you may be tense and nervous for the first 2 weeks after you quit, but this will pass.  Find new activities to keep your hands busy. Play with a  pen, coin, or rubber band. Doodle or draw things on paper.  Brush your teeth right after eating. This will help cut down on the craving for the taste of tobacco after meals. You can also try mouthwash.   Use oral substitutes in place of cigarettes. Try using lemon drops, carrots, cinnamon sticks, or chewing gum. Keep them handy so they  are available when you have the urge to smoke.  When you have the urge to smoke, try deep breathing.  Designate your home as a nonsmoking area.  If you are a heavy smoker, ask your health care provider about a prescription for nicotine chewing gum. It can ease your withdrawal from nicotine.  Reward yourself. Set aside the cigarette money you save and buy yourself something nice.  Look for support from others. Join a support group or smoking cessation program. Ask someone at home or at work to help you with your plan to quit smoking.  Always ask yourself, "Do I need this cigarette or is this just a reflex?" Tell yourself, "Today, I choose not to smoke," or "I do not want to smoke." You are reminding yourself of your decision to quit.  Do not replace cigarette smoking with electronic cigarettes (commonly called e-cigarettes). The safety of e-cigarettes is unknown, and some may contain harmful chemicals.  If you relapse, do not give up! Plan ahead and think about what you will do the next time you get the urge to smoke. HOW WILL I FEEL WHEN I QUIT SMOKING? You may have symptoms of withdrawal because your body is used to nicotine (the addictive substance in cigarettes). You may crave cigarettes, be irritable, feel very hungry, cough often, get headaches, or have difficulty concentrating. The withdrawal symptoms are only temporary. They are strongest when you first quit but will go away within 10-14 days. When withdrawal symptoms occur, stay in control. Think about your reasons for quitting. Remind yourself that these are signs that your body is healing and getting  used to being without cigarettes. Remember that withdrawal symptoms are easier to treat than the major diseases that smoking can cause.  Even after the withdrawal is over, expect periodic urges to smoke. However, these cravings are generally short lived and will go away whether you smoke or not. Do not smoke! WHAT RESOURCES ARE AVAILABLE TO HELP ME QUIT SMOKING? Your health care provider can direct you to community resources or hospitals for support, which may include:  Group support.  Education.  Hypnosis.  Therapy. Document Released: 02/18/2004 Document Revised: 10/06/2013 Document Reviewed: 11/07/2012 [facility] Patient Information 2015 [place], [place]. This information is not intended to replace advice given to you by your health care provider. Make sure you discuss any questions you have with your health care provider.

## 2014-11-03 NOTE — ED Provider Notes (Signed)
CSN: [id number]     Arrival date & time 11/03/14  1632 History  This chart was scribed for non-physician practitioner, Zacarias Pontes, PA-C, working with Orpah Greek, MD, by Stephania Fragmin, ED Scribe. This patient was seen in room TR09C/TR09C and the patient's care was started at 5:23 PM.    Chief Complaint  Patient presents with  . Sore Throat  . Medication Refill   Patient is a 29 y.o. male presenting with pharyngitis. The history is provided by the patient. No language interpreter was used.  Sore Throat This is a new problem. The current episode started more than 2 days ago. The problem occurs constantly. The problem has not changed since onset.Associated symptoms include shortness of breath. Pertinent negatives include no chest pain, no abdominal pain and no headaches. The symptoms are aggravated by eating (and breathing). Nothing relieves the symptoms. Treatments tried: cough syrup and Tylenol. The treatment provided no relief.    HPI Comments: William Gregory is a 29 y.o. male with a PMHx of asthma and an unknown psychiatric condition, who presents to the Emergency Department complaining of constant, nonradiating, 8/10, "itching" sore throat that has been ongoing for 1 week. Patient endorses associated clear rhinorrhea, nasal congestion, a cough productive with clear sputum, intermittent SOB, and wheezing. He states his throat itches when he talks and breathes. He has tried Tylenol and cough syrup with no relief. Patient has seasonal allergies, but he states he does not take any medication for this. Patient is a current smoker. He denies any known sick contacts or any recent travel. He also denies a history of DM. He denies trouble swallowing, trismus, ear symptoms, eye symptoms, fever, chills, chest pain, abdominal pain, nausea, vomiting, diarrhea, urinary symptoms, myalgias, arthralgias, numbness, tingling, weakness, or leg swelling. Ran out of his inhaler and needs a  refill.   Past Medical History  Diagnosis Date  . Knee joint pain, left   . Psychiatric problem     unspecified by patient (receives "injections" bi-weekly)  . Asthma    History reviewed. No pertinent past surgical history. No family history on file. History  Substance Use Topics  . Smoking status: Not on file  . Smokeless tobacco: Not on file  . Alcohol Use: No    Review of Systems  Constitutional: Negative for fever and chills.  HENT: Positive for rhinorrhea (clear), sinus pressure and sore throat. Negative for drooling, ear discharge, ear pain and trouble swallowing.   Eyes: Negative for pain, discharge and visual disturbance.  Respiratory: Positive for cough (productive with clear sputum), shortness of breath and wheezing.   Cardiovascular: Negative for chest pain and leg swelling.  Gastrointestinal: Negative for nausea, abdominal pain, diarrhea and rectal pain.  Genitourinary: Negative for dysuria and hematuria.  Musculoskeletal: Negative for myalgias and arthralgias.  Skin: Negative for color change.  Allergic/Immunologic: Negative for immunocompromised state.  Neurological: Negative for weakness, numbness and headaches.  Psychiatric/Behavioral: Negative for confusion.  10 Systems reviewed and all are negative for acute change except as noted in the HPI.   Allergies  Review of patient's allergies indicates no known allergies.  Home Medications   Prior to Admission medications   Medication Sig Start Date End Date Taking? Authorizing Provider  albuterol (PROVENTIL HFA) 108 (90 BASE) MCG/ACT inhaler Inhale 2 puffs into the lungs every 4 (four) hours as needed. 1-2 puff every 4-6 hours as needed wheezing    Historical Provider, MD  metoCLOPramide (REGLAN) 10 MG tablet Take 1 tablet (10 mg  total) by mouth every 6 (six) hours as needed (nausea/headache). 05/11/12   Wandra Arthurs, MD  risperiDONE (RISPERDAL) 0.25 MG tablet Take 0.25 mg by mouth daily.    Historical Provider, MD    BP 141/85 mmHg  Pulse 74  Temp(Src) 97.8 F (36.6 C) (Oral)  Resp 16  Ht 5\' 5"  (1.651 m)  SpO2 96% Physical Exam  Constitutional: He is oriented to person, place, and time. Vital signs are normal. He appears well-developed and well-nourished.  Non-toxic appearance. No distress.  Afebrile, nontoxic, NAD  HENT:  Head: Normocephalic and atraumatic.  Right Ear: Hearing, tympanic membrane, external ear and ear canal normal.  Left Ear: Hearing, tympanic membrane, external ear and ear canal normal.  Nose: Mucosal edema and rhinorrhea present.  Mouth/Throat: Uvula is midline, oropharynx is clear and moist and mucous membranes are normal. No trismus in the jaw. No uvula swelling. No oropharyngeal exudate.  Ears are clear bilaterally. Nose with mild edema and clear rhinorrhea. Oropharynx clear and moist, without uvular swelling or deviation, no trismus or drooling, no tonsillar swelling or erythema, no exudates.    Eyes: Conjunctivae and EOM are normal. Right eye exhibits no discharge. Left eye exhibits no discharge.  Neck: Normal range of motion. Neck supple.  Cardiovascular: Normal rate, regular rhythm, normal heart sounds and intact distal pulses.  Exam reveals no gallop and no friction rub.   No murmur heard. Pulmonary/Chest: Effort normal. No respiratory distress. He has no decreased breath sounds. He has wheezes. He has no rhonchi. He has no rales.  Diffuse expiratory wheezes in all lung fields, no rhonchi or rales, no hypoxia or increased WOB, speaking in full sentences, SpO2 96% on RA   Abdominal: Soft. Normal appearance and bowel sounds are normal. He exhibits no distension. There is no tenderness. There is no rigidity, no rebound, no guarding, no CVA tenderness, no tenderness at McBurney's point and negative Murphy's sign.  Musculoskeletal: Normal range of motion.  Lymphadenopathy:       Head (right side): No submandibular and no tonsillar adenopathy present.       Head (left side): No  submandibular and no tonsillar adenopathy present.    He has cervical adenopathy.       Right cervical: Superficial cervical adenopathy present.       Left cervical: Superficial cervical adenopathy present.  No head LAD, mild superficial cervical LAD bilaterally which is nonTTP  Neurological: He is alert and oriented to person, place, and time. He has normal strength. No sensory deficit.  Skin: Skin is warm, dry and intact. No rash noted.  Psychiatric: He has a normal mood and affect.  Nursing note and vitals reviewed.   ED Course  Procedures (including critical care time)  DIAGNOSTIC STUDIES: Oxygen Saturation is 96% on RA, normal by my interpretation.    COORDINATION OF CARE: 5:33 PM - Suspect asthma exacerbation. Discussed treatment plan with pt at bedside which includes CXR, breathing treatment, and f/u with PCP in 1 week, and pt agreed to plan.   Medications  albuterol (PROVENTIL HFA;VENTOLIN HFA) 108 (90 BASE) MCG/ACT inhaler 2 puff (not administered)  albuterol (PROVENTIL) (2.5 MG/3ML) 0.083% nebulizer solution 5 mg (5 mg Nebulization Given 11/03/14 1740)  ipratropium (ATROVENT) nebulizer solution 0.5 mg (0.5 mg Nebulization Given 11/03/14 1740)  predniSONE (DELTASONE) tablet 60 mg (60 mg Oral Given 11/03/14 1739)    Imaging Review Dg Chest 2 View  11/03/2014   CLINICAL DATA:  Cough and shortness of breath for 1 week.  EXAM: CHEST  2 VIEW  COMPARISON:  04/29/2009 chest radiograph  FINDINGS: The cardiomediastinal silhouette is unremarkable.  Mild peribronchial thickening is unchanged.  There is no evidence of focal airspace disease, pulmonary edema, suspicious pulmonary nodule/mass, pleural effusion, or pneumothorax. No acute bony abnormalities are identified.  IMPRESSION: No evidence of acute cardiopulmonary disease.  Mild chronic peribronchial thickening.   Electronically Signed   By: Margarette Canada M.D.   On: 11/03/2014 18:20     MDM   Final diagnoses:  Cough  Asthma  exacerbation  URI (upper respiratory infection)  TOBACCO USER    29 y.o. male here with URI symptoms. Oropharynx clear without tonsillar swelling or exudates. Some wheezing on lung exam, will give prednisone and nebs, and get CXR. Pt likely has viral syndrome vs asthma exacerbation. Doubt need for abx at this time since no fever and not ongoing for >2wks. Will reassess after nebs and CXR. Discussed tylenol/motrin for pain, pt declined now. Will reassess shortly.   6:28 PM Symptoms improved after nebs, lung sounds improved. CXR showing peribronchial thickening c/w bronchitis/asthma exacerbation. Will send home with inhaler since he has trouble with cost, and give rx for one. Will also give prednisone to go home. Discussed symptomatic care as well. Strongly encouraged smoking cessation. Salt water gargle discussed. Will have him f/up with PCP in 1wk for recheck. I explained the diagnosis and have given explicit precautions to return to the ER including for any other new or worsening symptoms. The patient understands and accepts the medical plan as it's been dictated and I have answered their questions. Discharge instructions concerning home care and prescriptions have been given. The patient is STABLE and is discharged to home in good condition.  I personally performed the services described in this documentation, which was scribed in my presence. The recorded information has been reviewed and is accurate.  BP 141/85 mmHg  Pulse 74  Temp(Src) 97.8 F (36.6 C) (Oral)  Resp 16  Ht 5\' 5"  (1.651 m)  SpO2 96%  Meds ordered this encounter  Medications  . albuterol (PROVENTIL) (2.5 MG/3ML) 0.083% nebulizer solution 5 mg    Sig:   . ipratropium (ATROVENT) nebulizer solution 0.5 mg    Sig:   . predniSONE (DELTASONE) tablet 60 mg    Sig:   . albuterol (PROVENTIL HFA;VENTOLIN HFA) 108 (90 BASE) MCG/ACT inhaler 2 puff    Sig:   . albuterol (PROVENTIL HFA;VENTOLIN HFA) 108 (90 BASE) MCG/ACT inhaler     Sig: Inhale 2 puffs into the lungs every 2 (two) hours as needed for wheezing or shortness of breath (cough).    Dispense:  1 Inhaler    Refill:  0    Order Specific Question:  Supervising Provider    Answer:  MILLER, BRIAN [[id number]]  . predniSONE (DELTASONE) 20 MG tablet    Sig: 3 tabs po daily x 3 days    Dispense:  9 tablet    Refill:  0    Order Specific Question:  Supervising Provider    Answer:  MILLER, BRIAN [[id number]]  . fluticasone (FLONASE) 50 MCG/ACT nasal spray    Sig: Place 2 sprays into both nostrils daily.    Dispense:  16 g    Refill:  0    Order Specific Question:  Supervising Provider    Answer:  Noemi Chapel [[id number]]       Janee Ureste Camprubi-Soms, PA-C 11/03/14 1832  Orpah Greek, MD 11/07/14 1021

## 2014-11-03 NOTE — ED Notes (Signed)
Pt reports sore throat, nasal congestion. Pt is out of his albuterol inhaler and needs a new one.

## 2014-11-03 NOTE — ED Notes (Signed)
Pt to xray

## 2015-03-12 ENCOUNTER — Emergency Department (HOSPITAL_COMMUNITY)
Admission: EM | Admit: 2015-03-12 | Discharge: 2015-03-12 | Disposition: A | Discharge status: Home / Self Care | Payer: Medicaid Other | Attending: Emergency Medicine | Admitting: Emergency Medicine

## 2015-03-12 ENCOUNTER — Encounter (HOSPITAL_COMMUNITY): Payer: Self-pay | Admitting: Emergency Medicine

## 2015-03-12 DIAGNOSIS — Z79899 Other long term (current) drug therapy: Secondary | ICD-10-CM | POA: Diagnosis not present

## 2015-03-12 DIAGNOSIS — Z720 Tobacco use: Secondary | ICD-10-CM | POA: Insufficient documentation

## 2015-03-12 DIAGNOSIS — J45909 Unspecified asthma, uncomplicated: Secondary | ICD-10-CM | POA: Diagnosis not present

## 2015-03-12 DIAGNOSIS — M545 Low back pain: Secondary | ICD-10-CM | POA: Insufficient documentation

## 2015-03-12 DIAGNOSIS — Z7951 Long term (current) use of inhaled steroids: Secondary | ICD-10-CM | POA: Insufficient documentation

## 2015-03-12 MED ORDER — PREDNISONE 20 MG PO TABS
60.0000 mg | ORAL_TABLET | Freq: Once | ORAL | Status: AC
Start: 2015-03-12 — End: 2015-03-12
  Administered 2015-03-12: 60 mg via ORAL
  Filled 2015-03-12: qty 3

## 2015-03-12 MED ORDER — PREDNISONE 20 MG PO TABS: 40.0000 mg | ORAL_TABLET | Freq: Every day | ORAL | Status: DC

## 2015-03-12 MED ORDER — METHOCARBAMOL 500 MG PO TABS: 500.0000 mg | ORAL_TABLET | Freq: Two times a day (BID) | ORAL | Status: DC

## 2015-03-12 MED ORDER — TRAMADOL HCL 50 MG PO TABS: 50.0000 mg | ORAL_TABLET | Freq: Four times a day (QID) | ORAL | Status: DC | PRN

## 2015-03-12 MED ORDER — NAPROXEN 250 MG PO TABS
500.0000 mg | ORAL_TABLET | Freq: Once | ORAL | Status: AC
  Administered 2015-03-12: 500 mg via ORAL
  Filled 2015-03-12: qty 2

## 2015-03-12 MED ORDER — NAPROXEN 500 MG PO TABS: 500.0000 mg | ORAL_TABLET | Freq: Two times a day (BID) | ORAL | Status: DC

## 2015-03-12 NOTE — Discharge Instructions (Signed)

## 2015-03-12 NOTE — ED Notes (Signed)
Pt states he has had LBP x 5 months due to carrying heavy boxes at work. Worse today.

## 2015-03-12 NOTE — ED Provider Notes (Signed)
CSN: [id number]     Arrival date & time 03/12/15  1017 History  By signing my name below, I, Starleen Arms, attest that this documentation has been prepared under the direction and in the presence of Delsa Grana, PA-C. Electronically Signed: Starleen Arms ED Scribe. 03/12/2015. 8:15 AM.   Chief Complaint  Patient presents with  . Back Pain   The history is provided by the patient. No language interpreter was used.   HPI Comments: LUQMAAN PAKKALA is a 29 y.o. male who presents to the Emergency Department complaining of constant, worsened, sharp/aching, non-radiating bilateral lower back pain onset 7 months ago, worse with walking and moving from standing to sitting, no relief with heat pad (no other tx's tried).  Patient denies acute onset and attributes this pain to lifting boxes at work.  Associated symptoms include diaphoresis yesterday.  He denies prior imaging for this complaint but has been seen by his PCP.  He denies IVDU, fever, bowel/bladder incontinence, saddle anesthesia, extremity numbness/weakness.    Past Medical History  Diagnosis Date  . Knee joint pain, left   . Psychiatric problem     unspecified by patient (receives "injections" bi-weekly)  . Asthma    History reviewed. No pertinent past surgical history. No family history on file. Social History  Substance Use Topics  . Smoking status: Current Every Day Smoker  . Smokeless tobacco: None  . Alcohol Use: Yes    Review of Systems  Constitutional: Positive for diaphoresis. Negative for fever, chills, activity change and fatigue.  HENT: Negative.   Respiratory: Negative.   Cardiovascular: Negative.   Gastrointestinal: Negative for abdominal pain.  Genitourinary: Negative.  Negative for difficulty urinating.  Musculoskeletal: Positive for myalgias, back pain and gait problem. Negative for joint swelling, neck pain and neck stiffness.  Skin: Negative.  Negative for rash.  Neurological: Negative for dizziness,  tremors, syncope, weakness, numbness and headaches.  Psychiatric/Behavioral: Negative.       Allergies  Ibuprofen  Home Medications   Prior to Admission medications   Medication Sig Start Date End Date Taking? Authorizing Provider  albuterol (PROVENTIL HFA) 108 (90 BASE) MCG/ACT inhaler Inhale 2 puffs into the lungs every 4 (four) hours as needed. 1-2 puff every 4-6 hours as needed wheezing    Historical Provider, MD  albuterol (PROVENTIL HFA;VENTOLIN HFA) 108 (90 BASE) MCG/ACT inhaler Inhale 2 puffs into the lungs every 2 (two) hours as needed for wheezing or shortness of breath (cough). 11/03/14   Mercedes Camprubi-Soms, PA-C  fluticasone (FLONASE) 50 MCG/ACT nasal spray Place 2 sprays into both nostrils daily. 11/03/14   Mercedes Camprubi-Soms, PA-C  methocarbamol (ROBAXIN) 500 MG tablet Take 1 tablet (500 mg total) by mouth 2 (two) times daily. 03/12/15   Delsa Grana, PA-C  metoCLOPramide (REGLAN) 10 MG tablet Take 1 tablet (10 mg total) by mouth every 6 (six) hours as needed (nausea/headache). 05/11/12   Wandra Arthurs, MD  naproxen (NAPROSYN) 500 MG tablet Take 1 tablet (500 mg total) by mouth 2 (two) times daily with a meal. 03/12/15   Delsa Grana, PA-C  predniSONE (DELTASONE) 20 MG tablet Take 2 tablets (40 mg total) by mouth daily. 03/12/15   Delsa Grana, PA-C  risperiDONE (RISPERDAL) 0.25 MG tablet Take 0.25 mg by mouth daily.    Historical Provider, MD  traMADol (ULTRAM) 50 MG tablet Take 1 tablet (50 mg total) by mouth every 6 (six) hours as needed. 03/12/15   Delsa Grana, PA-C   BP 128/72 mmHg  Pulse  50  Temp(Src) 97.4 F (36.3 C) (Oral)  Resp 16  Ht 5\' 5"  (1.651 m)  Wt 218 lb (98.884 kg)  BMI 36.28 kg/m2  SpO2 100% Physical Exam  Constitutional: He is oriented to person, place, and time. He appears well-developed and well-nourished. No distress.  HENT:  Head: Normocephalic and atraumatic.  Right Ear: External ear normal.  Left Ear: External ear normal.  Nose: Nose normal.   Mouth/Throat: Oropharynx is clear and moist. No oropharyngeal exudate.  Eyes: Conjunctivae and EOM are normal. Pupils are equal, round, and reactive to light. Right eye exhibits no discharge. Left eye exhibits no discharge. No scleral icterus.  Neck: Normal range of motion. Neck supple. No JVD present. No tracheal deviation present.  Cardiovascular: Normal rate, regular rhythm and intact distal pulses.  Exam reveals no gallop and no friction rub.   No murmur heard. Pulmonary/Chest: Effort normal and breath sounds normal. No stridor. No respiratory distress. He has no wheezes. He has no rales. He exhibits no tenderness.  Abdominal: Soft. Bowel sounds are normal. He exhibits no distension. There is no tenderness.  No CVA tenderness  Musculoskeletal: Normal range of motion. He exhibits tenderness. He exhibits no edema.       Lumbar back: He exhibits tenderness. He exhibits normal range of motion, no bony tenderness, no swelling, no edema, no deformity and no spasm.       Back:  Tenderness to lumbar paraspinal muscles, no spinal process ttp from cervical to lumbar spine.  Antalgic gait.    Lymphadenopathy:    He has no cervical adenopathy.  Neurological: He is alert and oriented to person, place, and time. He displays normal reflexes. He exhibits normal muscle tone. Coordination normal.  Normal sensation to light touch, sharp and dull throughout LE, normal strength 5/5 for flexion at hip, dorsal and plantar flexion bilaterally  Skin: Skin is warm and dry. No rash noted. He is not diaphoretic. No erythema. No pallor.  Psychiatric: He has a normal mood and affect. His behavior is normal. Judgment and thought content normal.  Nursing note and vitals reviewed.   ED Course  Procedures (including critical care time)  DIAGNOSTIC STUDIES: Oxygen Saturation is 100% on RA, normal by my interpretation.    COORDINATION OF CARE:  11:17 AM Will prescribe anti-inflammatory and muscle relaxant.  Patient  advised of return precautions.    Labs Review Labs Reviewed - No data to display  Imaging Review No results found. I have personally reviewed and evaluated these images and lab results as part of my medical decision-making.   EKG Interpretation None      MDM   Final diagnoses:  Bilateral low back pain, with sciatica presence unspecified    Patient with back pain. Normal neurological exam, no evidence of urinary incontinence or retention, pain is consistently reproducible. There is no evidence of AAA or concern for dissection at this time.   Patient can walk but states is painful.  No loss of bowel or bladder control.  No concern for cauda equina.  No fever, night sweats, weight loss, h/o cancer, IVDU.  Pain treated here in the department with adequate improvement. RICE protocol and pain medicine indicated and discussed with patient. I have also discussed reasons to return immediately to the ER.  Patient expresses understanding and agrees with plan.  Medications  predniSONE (DELTASONE) tablet 60 mg (60 mg Oral Given 03/12/15 1207)  naproxen (NAPROSYN) tablet 500 mg (500 mg Oral Given 03/12/15 1207)   Filed Vitals:  03/12/15 1041  BP: 128/72  Pulse: 50  Temp: 97.4 F (36.3 C)  TempSrc: Oral  Resp: 16  Height: 5\' 5"  (1.651 m)  Weight: 218 lb (98.884 kg)  SpO2: 100%       I personally performed the services described in this documentation, which was scribed in my presence. The recorded information has been reviewed and is accurate.    Delsa Grana, PA-C 03/15/15 [id number]  Alfonzo Beers, MD 03/16/15 [phone]

## 2015-08-13 ENCOUNTER — Encounter (HOSPITAL_COMMUNITY): Payer: Self-pay | Admitting: Vascular Surgery

## 2015-08-13 DIAGNOSIS — G8929 Other chronic pain: Secondary | ICD-10-CM | POA: Diagnosis not present

## 2015-08-13 DIAGNOSIS — R109 Unspecified abdominal pain: Secondary | ICD-10-CM | POA: Diagnosis present

## 2015-08-13 DIAGNOSIS — E860 Dehydration: Secondary | ICD-10-CM | POA: Diagnosis not present

## 2015-08-13 DIAGNOSIS — J45909 Unspecified asthma, uncomplicated: Secondary | ICD-10-CM | POA: Insufficient documentation

## 2015-08-13 DIAGNOSIS — F329 Major depressive disorder, single episode, unspecified: Secondary | ICD-10-CM | POA: Diagnosis not present

## 2015-08-13 DIAGNOSIS — M549 Dorsalgia, unspecified: Secondary | ICD-10-CM | POA: Insufficient documentation

## 2015-08-13 DIAGNOSIS — N179 Acute kidney failure, unspecified: Secondary | ICD-10-CM | POA: Diagnosis not present

## 2015-08-13 DIAGNOSIS — F1010 Alcohol abuse, uncomplicated: Secondary | ICD-10-CM | POA: Insufficient documentation

## 2015-08-13 DIAGNOSIS — K219 Gastro-esophageal reflux disease without esophagitis: Secondary | ICD-10-CM | POA: Insufficient documentation

## 2015-08-13 DIAGNOSIS — I252 Old myocardial infarction: Secondary | ICD-10-CM | POA: Insufficient documentation

## 2015-08-13 DIAGNOSIS — M6282 Rhabdomyolysis: Secondary | ICD-10-CM | POA: Insufficient documentation

## 2015-08-13 DIAGNOSIS — F17210 Nicotine dependence, cigarettes, uncomplicated: Secondary | ICD-10-CM | POA: Diagnosis not present

## 2015-08-13 DIAGNOSIS — Z23 Encounter for immunization: Secondary | ICD-10-CM | POA: Insufficient documentation

## 2015-08-13 LAB — URINE MICROSCOPIC-ADD ON

## 2015-08-13 LAB — CBC
HCT: 43.5 % (ref 39.0–52.0)
Hemoglobin: 14.9 g/dL (ref 13.0–17.0)
MCH: 30.8 pg (ref 26.0–34.0)
MCHC: 34.3 g/dL (ref 30.0–36.0)
MCV: 90.1 fL (ref 78.0–100.0)
PLATELETS: 218 10*3/uL (ref 150–400)
RBC: 4.83 MIL/uL (ref 4.22–5.81)
RDW: 13.3 % (ref 11.5–15.5)
WBC: 8.4 10*3/uL (ref 4.0–10.5)

## 2015-08-13 LAB — URINALYSIS, ROUTINE W REFLEX MICROSCOPIC
Bilirubin Urine: NEGATIVE
GLUCOSE, UA: NEGATIVE mg/dL
KETONES UR: NEGATIVE mg/dL
Leukocytes, UA: NEGATIVE
Nitrite: NEGATIVE
PROTEIN: 100 mg/dL — AB
Specific Gravity, Urine: 1.016 (ref 1.005–1.030)
pH: 6 (ref 5.0–8.0)

## 2015-08-13 NOTE — ED Notes (Addendum)
Pt reports to the ED for eval of multiple s/s. Pt reports low back pain, left thigh pain, ear ache, and GERD s/s. Pt denies any recent injury. Pt denies any numbness, tingling, paralysis, or bowel or bladder incontinence. Pt denies any leg swelling. Pt reports N/V/D. 2 episodes of emesis today and 5 episodes of diarrhea. Tried to drink ginger ale but that did not help. Pt A&Ox3, resp e/u, and skin warm and dry. Disoriented to time.

## 2015-08-14 ENCOUNTER — Encounter (HOSPITAL_COMMUNITY): Payer: Self-pay | Admitting: Internal Medicine

## 2015-08-14 ENCOUNTER — Observation Stay (HOSPITAL_COMMUNITY): Payer: Medicaid Other

## 2015-08-14 ENCOUNTER — Observation Stay (HOSPITAL_COMMUNITY)
Admission: EM | Admit: 2015-08-14 | Discharge: 2015-08-14 | Disposition: A | Discharge status: Home / Self Care | Payer: Medicaid Other | Attending: Internal Medicine | Admitting: Internal Medicine

## 2015-08-14 DIAGNOSIS — R112 Nausea with vomiting, unspecified: Secondary | ICD-10-CM

## 2015-08-14 DIAGNOSIS — K219 Gastro-esophageal reflux disease without esophagitis: Secondary | ICD-10-CM | POA: Diagnosis present

## 2015-08-14 DIAGNOSIS — N179 Acute kidney failure, unspecified: Secondary | ICD-10-CM | POA: Diagnosis not present

## 2015-08-14 DIAGNOSIS — F329 Major depressive disorder, single episode, unspecified: Secondary | ICD-10-CM

## 2015-08-14 DIAGNOSIS — F32A Depression, unspecified: Secondary | ICD-10-CM | POA: Diagnosis present

## 2015-08-14 DIAGNOSIS — J45909 Unspecified asthma, uncomplicated: Secondary | ICD-10-CM | POA: Diagnosis present

## 2015-08-14 DIAGNOSIS — R109 Unspecified abdominal pain: Secondary | ICD-10-CM | POA: Diagnosis present

## 2015-08-14 DIAGNOSIS — J4520 Mild intermittent asthma, uncomplicated: Secondary | ICD-10-CM | POA: Diagnosis not present

## 2015-08-14 DIAGNOSIS — M549 Dorsalgia, unspecified: Secondary | ICD-10-CM | POA: Diagnosis present

## 2015-08-14 DIAGNOSIS — R1033 Periumbilical pain: Secondary | ICD-10-CM

## 2015-08-14 DIAGNOSIS — Z720 Tobacco use: Secondary | ICD-10-CM | POA: Diagnosis present

## 2015-08-14 DIAGNOSIS — I252 Old myocardial infarction: Secondary | ICD-10-CM

## 2015-08-14 DIAGNOSIS — F17200 Nicotine dependence, unspecified, uncomplicated: Secondary | ICD-10-CM

## 2015-08-14 DIAGNOSIS — F1010 Alcohol abuse, uncomplicated: Secondary | ICD-10-CM

## 2015-08-14 DIAGNOSIS — R197 Diarrhea, unspecified: Secondary | ICD-10-CM

## 2015-08-14 HISTORY — DX: Depression, unspecified: F32.A

## 2015-08-14 HISTORY — DX: Tobacco use: Z72.0

## 2015-08-14 HISTORY — DX: Dorsalgia, unspecified: M54.9

## 2015-08-14 HISTORY — DX: Alcohol abuse, uncomplicated: F10.10

## 2015-08-14 HISTORY — DX: Major depressive disorder, single episode, unspecified: F32.9

## 2015-08-14 LAB — COMPREHENSIVE METABOLIC PANEL
ALK PHOS: 70 U/L (ref 38–126)
ALT: 12 U/L — ABNORMAL LOW (ref 17–63)
AST: 25 U/L (ref 15–41)
Albumin: 3.7 g/dL (ref 3.5–5.0)
Anion gap: 11 (ref 5–15)
BILIRUBIN TOTAL: 0.7 mg/dL (ref 0.3–1.2)
BUN: 23 mg/dL — AB (ref 6–20)
CALCIUM: 9.3 mg/dL (ref 8.9–10.3)
CO2: 25 mmol/L (ref 22–32)
Chloride: 105 mmol/L (ref 101–111)
Creatinine, Ser: 2.39 mg/dL — ABNORMAL HIGH (ref 0.61–1.24)
GFR calc Af Amer: 40 mL/min — ABNORMAL LOW (ref >60)
GFR, EST NON AFRICAN AMERICAN: 34 mL/min — AB (ref >60)
GLUCOSE: 116 mg/dL — AB (ref 65–99)
POTASSIUM: 4 mmol/L (ref 3.5–5.1)
Sodium: 141 mmol/L (ref 135–145)
TOTAL PROTEIN: 6.6 g/dL (ref 6.5–8.1)

## 2015-08-14 LAB — C DIFFICILE QUICK SCREEN W PCR REFLEX
C Diff antigen: NEGATIVE
C Diff interpretation: NEGATIVE
C Diff toxin: NEGATIVE

## 2015-08-14 LAB — CREATININE, URINE, RANDOM: CREATININE, URINE: 146.43 mg/dL

## 2015-08-14 LAB — GASTROINTESTINAL PANEL BY PCR, STOOL (REPLACES STOOL CULTURE)
ADENOVIRUS F40/41: NOT DETECTED
ASTROVIRUS: NOT DETECTED
CAMPYLOBACTER SPECIES: NOT DETECTED
CRYPTOSPORIDIUM: NOT DETECTED
Cyclospora cayetanensis: NOT DETECTED
E. coli O157: NOT DETECTED
ENTEROAGGREGATIVE E COLI (EAEC): NOT DETECTED
ENTEROPATHOGENIC E COLI (EPEC): NOT DETECTED
Entamoeba histolytica: NOT DETECTED
Enterotoxigenic E coli (ETEC): NOT DETECTED
GIARDIA LAMBLIA: NOT DETECTED
Norovirus GI/GII: NOT DETECTED
PLESIMONAS SHIGELLOIDES: NOT DETECTED
Rotavirus A: NOT DETECTED
Salmonella species: NOT DETECTED
Sapovirus (I, II, IV, and V): NOT DETECTED
Shiga like toxin producing E coli (STEC): NOT DETECTED
Shigella/Enteroinvasive E coli (EIEC): NOT DETECTED
Vibrio cholerae: NOT DETECTED
Vibrio species: NOT DETECTED
YERSINIA ENTEROCOLITICA: NOT DETECTED

## 2015-08-14 LAB — CBC
HCT: 41.4 % (ref 39.0–52.0)
HEMOGLOBIN: 13.9 g/dL (ref 13.0–17.0)
MCH: 30.3 pg (ref 26.0–34.0)
MCHC: 33.6 g/dL (ref 30.0–36.0)
MCV: 90.2 fL (ref 78.0–100.0)
PLATELETS: 218 10*3/uL (ref 150–400)
RBC: 4.59 MIL/uL (ref 4.22–5.81)
RDW: 13.5 % (ref 11.5–15.5)
WBC: 8.1 10*3/uL (ref 4.0–10.5)

## 2015-08-14 LAB — BASIC METABOLIC PANEL
Anion gap: 12 (ref 5–15)
BUN: 21 mg/dL — AB (ref 6–20)
CALCIUM: 9 mg/dL (ref 8.9–10.3)
CO2: 21 mmol/L — AB (ref 22–32)
CREATININE: 2.26 mg/dL — AB (ref 0.61–1.24)
Chloride: 109 mmol/L (ref 101–111)
GFR calc Af Amer: 42 mL/min — ABNORMAL LOW (ref >60)
GFR calc non Af Amer: 37 mL/min — ABNORMAL LOW (ref >60)
GLUCOSE: 97 mg/dL (ref 65–99)
Potassium: 4 mmol/L (ref 3.5–5.1)
Sodium: 142 mmol/L (ref 135–145)

## 2015-08-14 LAB — PROTIME-INR
INR: 0.99 (ref 0.00–1.49)
PROTHROMBIN TIME: 13.3 s (ref 11.6–15.2)

## 2015-08-14 LAB — RAPID URINE DRUG SCREEN, HOSP PERFORMED
AMPHETAMINES: NOT DETECTED
BARBITURATES: NOT DETECTED
BENZODIAZEPINES: NOT DETECTED
COCAINE: NOT DETECTED
Opiates: NOT DETECTED
TETRAHYDROCANNABINOL: POSITIVE — AB

## 2015-08-14 LAB — LIPASE, BLOOD: Lipase: 47 U/L (ref 11–51)

## 2015-08-14 LAB — CK: CK TOTAL: 501 U/L — AB (ref 49–397)

## 2015-08-14 LAB — SODIUM, URINE, RANDOM: Sodium, Ur: 130 mmol/L

## 2015-08-14 MED ORDER — ACETAMINOPHEN 650 MG RE SUPP: 650.0000 mg | Freq: Four times a day (QID) | RECTAL | Status: DC | PRN

## 2015-08-14 MED ORDER — LORAZEPAM 1 MG PO TABS: 1.0000 mg | ORAL_TABLET | Freq: Four times a day (QID) | ORAL | Status: DC | PRN

## 2015-08-14 MED ORDER — ONDANSETRON 4 MG PO TBDP: 4.0000 mg | ORAL_TABLET | Freq: Three times a day (TID) | ORAL | Status: DC | PRN

## 2015-08-14 MED ORDER — ONDANSETRON HCL 4 MG/2ML IJ SOLN: 4.0000 mg | Freq: Three times a day (TID) | INTRAMUSCULAR | Status: DC | PRN

## 2015-08-14 MED ORDER — LORAZEPAM 2 MG/ML IJ SOLN: 1.0000 mg | Freq: Four times a day (QID) | INTRAMUSCULAR | Status: DC | PRN

## 2015-08-14 MED ORDER — LORAZEPAM 2 MG/ML IJ SOLN
0.0000 mg | Freq: Four times a day (QID) | INTRAMUSCULAR | Status: DC
Start: 2015-08-14 — End: 2015-08-14

## 2015-08-14 MED ORDER — SODIUM CHLORIDE 0.9 % IV BOLUS (SEPSIS)
1000.0000 mL | Freq: Once | INTRAVENOUS | Status: AC
  Administered 2015-08-14: 1000 mL via INTRAVENOUS

## 2015-08-14 MED ORDER — FOLIC ACID 1 MG PO TABS
1.0000 mg | ORAL_TABLET | Freq: Every day | ORAL | Status: DC
  Administered 2015-08-14: 1 mg via ORAL
  Filled 2015-08-14: qty 1

## 2015-08-14 MED ORDER — FAMOTIDINE IN NACL 20-0.9 MG/50ML-% IV SOLN
20.0000 mg | Freq: Two times a day (BID) | INTRAVENOUS | Status: DC
Start: 2015-08-14 — End: 2015-08-14
  Administered 2015-08-14: 20 mg via INTRAVENOUS
  Filled 2015-08-14 (×2): qty 50

## 2015-08-14 MED ORDER — ACETAMINOPHEN 325 MG PO TABS: 650.0000 mg | ORAL_TABLET | Freq: Four times a day (QID) | ORAL | Status: DC | PRN

## 2015-08-14 MED ORDER — ALBUTEROL SULFATE (2.5 MG/3ML) 0.083% IN NEBU: 2.5000 mg | INHALATION_SOLUTION | RESPIRATORY_TRACT | Status: DC | PRN

## 2015-08-14 MED ORDER — INFLUENZA VAC SPLIT QUAD 0.5 ML IM SUSY
0.5000 mL | PREFILLED_SYRINGE | INTRAMUSCULAR | Status: AC
  Administered 2015-08-14: 0.5 mL via INTRAMUSCULAR

## 2015-08-14 MED ORDER — ALBUTEROL SULFATE HFA 108 (90 BASE) MCG/ACT IN AERS: 2.0000 | INHALATION_SPRAY | RESPIRATORY_TRACT | Status: DC | PRN

## 2015-08-14 MED ORDER — ENOXAPARIN SODIUM 40 MG/0.4ML ~~LOC~~ SOLN: 40.0000 mg | SUBCUTANEOUS | Status: DC

## 2015-08-14 MED ORDER — SODIUM CHLORIDE 0.9 % IV SOLN: INTRAVENOUS | Status: DC

## 2015-08-14 MED ORDER — OXYCODONE-ACETAMINOPHEN 5-325 MG PO TABS
2.0000 | ORAL_TABLET | Freq: Four times a day (QID) | ORAL | Status: DC | PRN
  Administered 2015-08-14: 2 via ORAL
  Filled 2015-08-14: qty 2

## 2015-08-14 MED ORDER — IOHEXOL 300 MG/ML  SOLN
25.0000 mL | INTRAMUSCULAR | Status: AC
  Administered 2015-08-14 (×2): 25 mL via ORAL

## 2015-08-14 MED ORDER — MORPHINE SULFATE (PF) 2 MG/ML IV SOLN: 2.0000 mg | INTRAVENOUS | Status: DC | PRN

## 2015-08-14 MED ORDER — LORAZEPAM 2 MG/ML IJ SOLN: 0.0000 mg | Freq: Two times a day (BID) | INTRAMUSCULAR | Status: DC

## 2015-08-14 MED ORDER — ADULT MULTIVITAMIN W/MINERALS CH
1.0000 | ORAL_TABLET | Freq: Every day | ORAL | Status: DC
  Administered 2015-08-14: 1 via ORAL
  Filled 2015-08-14: qty 1

## 2015-08-14 MED ORDER — VITAMIN B-1 100 MG PO TABS
100.0000 mg | ORAL_TABLET | Freq: Every day | ORAL | Status: DC
Start: 2015-08-14 — End: 2015-08-14

## 2015-08-14 MED ORDER — PNEUMOCOCCAL VAC POLYVALENT 25 MCG/0.5ML IJ INJ
0.5000 mL | INJECTION | INTRAMUSCULAR | Status: AC
  Administered 2015-08-14: 0.5 mL via INTRAMUSCULAR

## 2015-08-14 MED ORDER — NICOTINE 21 MG/24HR TD PT24
21.0000 mg | MEDICATED_PATCH | Freq: Every day | TRANSDERMAL | Status: DC
  Administered 2015-08-14: 21 mg via TRANSDERMAL
  Filled 2015-08-14: qty 1

## 2015-08-14 MED ORDER — THIAMINE HCL 100 MG/ML IJ SOLN
100.0000 mg | Freq: Every day | INTRAMUSCULAR | Status: DC
  Administered 2015-08-14: 100 mg via INTRAVENOUS
  Filled 2015-08-14: qty 2

## 2015-08-14 NOTE — ED Notes (Signed)
Pt finished drinking contrast.

## 2015-08-14 NOTE — H&P (Signed)
[facility] History and Physical  William Gregory [id number] DOB: 01-25-1986 DOA: 08/14/2015  Referring physician: ED physician PCP: No primary care provider on file.  Specialists:   Chief Complaint: Nausea, vomiting, diarrhea, abdominal pain, heartburn, muscle pain.  HPI: William Gregory is a 30 y.o. male with PMH of asthma, alcohol abuse, tobacco abuse, myocardial infarction, depression, chronic back pain, who presents with nausea, vomiting, diarrhea, abdominal pain, heartburn, muscle pain and back pain.  Patient reports that he has been having nausea, vomiting, diarrhea and abdominal pain for one week. He vomited twice today. He had 2 bowel movement with loose stool today. His abdominal pain is located in the periumbilical area, constant, 8 out of 10, nonradiating. He complains of heartburn with acid reflux. Patient reports that he has been walking every day, at least 1 hour each each time at very fast pace. He developed a muscle pain over both upper thighs. He has chronic back pain, which has worsened today. Patient does not have chest pain, cough, shortness breath, symptoms of a UTI or unilateral weakness. Admits to drinking 3 - 40 oz beers today.  In ED, patient was found to have CK 501, negative urinalysis, WBC 8.4, temperature normal, no tachycardia, no tachypnea, AKi with creatinine 2.39, BUN 23. Patient is admitted to inpatient for further evaluation and treatment.  EKG: Not done in ED, will get one.   Where does patient live?   At home   Can patient participate in ADLs?  Yes  Review of Systems:   General: no fevers, chills, no changes in body weight, has poor appetite, has fatigue HEENT: no blurry vision, hearing changes or sore throat Pulm: no dyspnea, coughing, wheezing CV: no chest pain, no palpitations Abd: nausea, vomiting, abdominal pain, diarrhea, no constipation GU: no dysuria, burning on urination, increased urinary frequency, hematuria  Ext: no leg  edema.  Neuro: no unilateral weakness, numbness, or tingling, no vision change or hearing loss.  Skin: no rash MSK: has lowe back pain and muscle pain.  Heme: No easy bruising.  Travel history: No recent long distant travel.  Allergy:  Allergies  Allergen Reactions  . Ibuprofen Rash    Past Medical History  Diagnosis Date  . Knee joint pain, left   . Psychiatric problem     unspecified by patient (receives "injections" bi-weekly)  . Asthma   . Depression   . Tobacco abuse   . Alcohol abuse   . Back pain     History reviewed. No pertinent past surgical history.  Social History:  reports that he has been smoking Cigarettes.  He has been smoking about 1.00 pack per day. He has never used smokeless tobacco. He reports that he drinks alcohol. He reports that he does not use illicit drugs.  Family History:  Family History  Problem Relation Age of Onset  . Diabetes Mother   . Diabetes Father      Prior to Admission medications   Medication Sig Start Date End Date Taking? Authorizing Provider  albuterol (PROVENTIL HFA;VENTOLIN HFA) 108 (90 BASE) MCG/ACT inhaler Inhale 2 puffs into the lungs every 2 (two) hours as needed for wheezing or shortness of breath (cough). Patient not taking: Reported on 08/14/2015 11/03/14   Mercedes Camprubi-Soms, PA-C  fluticasone (FLONASE) 50 MCG/ACT nasal spray Place 2 sprays into both nostrils daily. Patient not taking: Reported on 08/14/2015 11/03/14   Mercedes Camprubi-Soms, PA-C  methocarbamol (ROBAXIN) 500 MG tablet Take 1 tablet (500 mg total) by mouth 2 (two)  times daily. Patient not taking: Reported on 08/14/2015 03/12/15   Delsa Grana, PA-C  metoCLOPramide (REGLAN) 10 MG tablet Take 1 tablet (10 mg total) by mouth every 6 (six) hours as needed (nausea/headache). Patient not taking: Reported on 08/14/2015 05/11/12   Wandra Arthurs, MD  naproxen (NAPROSYN) 500 MG tablet Take 1 tablet (500 mg total) by mouth 2 (two) times daily with a meal. Patient not  taking: Reported on 08/14/2015 03/12/15   Delsa Grana, PA-C  predniSONE (DELTASONE) 20 MG tablet Take 2 tablets (40 mg total) by mouth daily. Patient not taking: Reported on 08/14/2015 03/12/15   Delsa Grana, PA-C  traMADol (ULTRAM) 50 MG tablet Take 1 tablet (50 mg total) by mouth every 6 (six) hours as needed. Patient not taking: Reported on 08/14/2015 03/12/15   Delsa Grana, PA-C    Physical Exam: Filed Vitals:   08/13/15 2247 08/14/15 0309 08/14/15 0330  BP: 145/85  124/66  Pulse: 63  54  Temp: 98.1 F (36.7 C) 98 F (36.7 C)   TempSrc: Oral Oral   Resp: 18    SpO2: 95%  98%   General: Not in acute distress. Dry mucus and membrane HEENT:       Eyes: PERRL, EOMI, no scleral icterus.       ENT: No discharge from the ears and nose, no pharynx injection, no tonsillar enlargement.        Neck: No JVD, no bruit, no mass felt. Heme: No neck lymph node enlargement. Cardiac: S1/S2, RRR, No murmurs, No gallops or rubs. Pulm: No rales, wheezing, rhonchi or rubs. Abd: Soft, nondistended, tenderness over the periumbilical area, no rebound pain, no organomegaly, BS present. Ext: No pitting leg edema bilaterally. 2+DP/PT pulse bilaterally. Musculoskeletal: Has tenderness over bilateral upper thighs. Has tenderness over midline of lower back. Skin: No rashes.  Neuro: Alert, oriented X3, cranial nerves II-XII grossly intact, moves all extremities normally. Psych: Patient is not psychotic, no suicidal or hemocidal ideation.  Labs on Admission:  Basic Metabolic Panel:  Recent Labs Lab 08/13/15 2313  NA 141  K 4.0  CL 105  CO2 25  GLUCOSE 116*  BUN 23*  CREATININE 2.39*  CALCIUM 9.3   Liver Function Tests:  Recent Labs Lab 08/13/15 2313  AST 25  ALT 12*  ALKPHOS 70  BILITOT 0.7  PROT 6.6  ALBUMIN 3.7    Recent Labs Lab 08/13/15 2313  LIPASE 47   No results for input(s): AMMONIA in the last 168 hours. CBC:  Recent Labs Lab 08/13/15 2313  WBC 8.4  HGB 14.9  HCT 43.5   MCV 90.1  PLT 218   Cardiac Enzymes:  Recent Labs Lab 08/13/15 0257  CKTOTAL 501*    BNP (last 3 results) No results for input(s): BNP in the last 8760 hours.  ProBNP (last 3 results) No results for input(s): PROBNP in the last 8760 hours.  CBG: No results for input(s): GLUCAP in the last 168 hours.  Radiological Exams on Admission: No results found.  Assessment/Plan Principal Problem:   AKI (acute kidney injury) ([place]) Active Problems:   Alcohol abuse   TOBACCO USER   Depression   MYOCARDIAL INFARCTION, HX OF   Asthma   Backache   Nausea vomiting and diarrhea   Abdominal pain   GERD (gastroesophageal reflux disease)   AKI (acute kidney injury) ([place]): Most likely due to prerenal secondary to dehydration from vomiting and no diarrhea. His CK is slightly elevated at 501, indicating mild rhabdomyolysis. No hematuria  on urinalysis, less likely to have glomerulonephritis.  -will admit to med-surg bed for observation -IVF: 2 L normal saline bolus, then 150 mL per hour - Check FeNa - f/u CT-abd/pelvis to r/u hydronephrosis (CT abdomen/pelvis is for abdominal pain as below) - Follow up renal function by BMP - Avoid NSAIDs  Nausea, vomiting, diarrhea and abdominal pain: Etiology is not clear. It has been going on for 1 week. Patient has not used antibiotics recently. Lipase is normal. Urinalysis is negative.  -C diff PCR, GI pathogen panel -CT abdomen/pelvis without contrast -When necessary Zofran for nausea and morphine for pain -IV pepcide for possible acid reflux versus alcoholic gastritis   Tobacco abuse and Alcohol abuse: -Did counseling about importance of quitting smoking -Nicotine patch -Did counseling about the importance of quitting drinking -CIWA protocol  Asthma: stable. Not on medications at home. -When necessary albuterol nebulizers  Hx of MYOCARDIAL INFARCTION: No chest pain. Not on medications at home. -Observe closely  Muscle pain over  bilateral thigh: likely due to mild rhabdomyolysis -When necessary Percocet  Back pain -Percocet when necessary   DVT ppx: SQ Lovenox  Code Status: Full code Family Communication: None at bed side.   Disposition Plan: Admit to inpatient   Date of Service 08/14/2015    Ivor Costa [facility] Pager [phone]  If 7PM-7AM, please contact night-coverage [email or web address] Password [facility] 08/14/2015, 4:24 AM

## 2015-08-14 NOTE — Discharge Summary (Signed)
Physician Discharge Summary  William Gregory [id number] DOB: [birth year] DOA: 08/14/2015  PCP: No primary care provider on file.  Admit date: 08/14/2015 Discharge date: 08/14/2015  Time spent: > 35 minutes  Recommendations for Outpatient Follow-up:  1. Monitor serum creatinine 2. Monitor blood sugars   Discharge Diagnoses:  Principal Problem:   AKI (acute kidney injury) ([place]) Active Problems:   Alcohol abuse   TOBACCO USER   Depression   MYOCARDIAL INFARCTION, HX OF   Asthma   Backache   Nausea vomiting and diarrhea   Abdominal pain   GERD (gastroesophageal reflux disease)   Discharge Condition: stable  Diet recommendation: heart healthy/carb modified diet  Filed Weights   08/14/15 0910  Weight: 98.431 kg (217 lb)    History of present illness:  From original HPI: 30 y/o who presented with with nausea, vomiting, diarrhea, abdominal discomfort, heartburn.  Hospital Course:  Nausea, vomiting, diarrhea -CT scan of abdomen and pelvis negative. Most likely viral gastroenteritis. Patient states he feels much better and is requesting discharge. White blood cell count within normal limits at 8.1 on last check and patient afebrile.  - C diff negative - provide script for Zofran prn nausea  Elevated serum creatinine -Recommend primary care physician continue to monitor. Most likely elevated due to prerenal etiology given diarrhea and vomiting. Patient was given IV fluids and I suspect his kidney function will continue to improve  Procedures:  None  Consultations:  none  Discharge Exam: Filed Vitals:   08/14/15 0550 08/14/15 0910  BP: 143/87 137/77  Pulse: 58 54  Temp:  98.2 F (36.8 C)  Resp:  16    General: Pt in nad, alert and awake Cardiovascular: rrr, no mrg Respiratory: cta bl, no wheezes Abdomen: soft, NT, ND, no guarding  Discharge Instructions   Discharge Instructions    Call MD for:  severe uncontrolled pain    Complete by:  As directed       Call MD for:  temperature >100.4    Complete by:  As directed      Diet - low sodium heart healthy    Complete by:  As directed      Discharge instructions    Complete by:  As directed   Please follow up with your primary care physician in 1-2 weeks or sooner should any new concerns arise.     Increase activity slowly    Complete by:  As directed           Current Discharge Medication List    START taking these medications   Details  acetaminophen (TYLENOL) 325 MG tablet Take 2 tablets (650 mg total) by mouth every 6 (six) hours as needed for mild pain (or Fever >/= 101). Qty: 30 tablet, Refills: 0    ondansetron (ZOFRAN ODT) 4 MG disintegrating tablet Take 1 tablet (4 mg total) by mouth every 8 (eight) hours as needed for nausea or vomiting. Qty: 20 tablet, Refills: 0      CONTINUE these medications which have NOT CHANGED   Details  albuterol (PROVENTIL HFA;VENTOLIN HFA) 108 (90 BASE) MCG/ACT inhaler Inhale 2 puffs into the lungs every 2 (two) hours as needed for wheezing or shortness of breath (cough). Qty: 1 Inhaler, Refills: 0    fluticasone (FLONASE) 50 MCG/ACT nasal spray Place 2 sprays into both nostrils daily. Qty: 16 g, Refills: 0    metoCLOPramide (REGLAN) 10 MG tablet Take 1 tablet (10 mg total) by mouth every 6 (six) hours as needed (  nausea/headache). Qty: 10 tablet, Refills: 0    traMADol (ULTRAM) 50 MG tablet Take 1 tablet (50 mg total) by mouth every 6 (six) hours as needed. Qty: 15 tablet, Refills: 0      STOP taking these medications     methocarbamol (ROBAXIN) 500 MG tablet      naproxen (NAPROSYN) 500 MG tablet      predniSONE (DELTASONE) 20 MG tablet        Allergies  Allergen Reactions  . Ibuprofen Rash      The results of significant diagnostics from this hospitalization (including imaging, microbiology, ancillary and laboratory) are listed below for reference.    Significant Diagnostic Studies: Ct Abdomen Pelvis Wo  Contrast  08/14/2015  CLINICAL DATA:  Abdominal pain. Nausea, vomiting, and diarrhea for approximately 1 week. Paraumbilical pain. EXAM: CT ABDOMEN AND PELVIS WITHOUT CONTRAST TECHNIQUE: Multidetector CT imaging of the abdomen and pelvis was performed following the standard protocol without IV contrast. COMPARISON:  None. FINDINGS: Lower chest: The lung bases are clear without focal nodule, mass, or airspace disease. The heart size is normal. No significant pleural or pericardial effusion is present. Hepatobiliary: The liver, common bile duct, and gallbladder are within normal limits. Pancreas: Unremarkable. Spleen: Within normal limits. Adrenals/Urinary Tract: The adrenal glands are normal bilaterally. The kidneys and ureters are unremarkable. There is no nephrolithiasis. There is no ureteral obstruction. The urinary bladder is within normal limits. Stomach/Bowel: The stomach and duodenum are within normal limits. The small bowel is unremarkable. There is no hernia. The appendix is visualized and normal. The ascending and transverse colon are within normal limits. The descending and sigmoid colon are unremarkable. Vascular/Lymphatic: No significant focal vascular disease is present. There is no significant adenopathy. Reproductive: Negative. Other: No significant fluid collections are present. Musculoskeletal: Vertebral body heights and alignment are maintained. No focal lytic or blastic lesions are present. IMPRESSION: Negative CT of the abdomen and pelvis. Electronically Signed   By: San Morelle M.D.   On: 08/14/2015 07:36    Microbiology: Recent Results (from the past 240 hour(s))  C difficile quick scan w PCR reflex     Status: None   Collection Time: 08/14/15  8:27 AM  Result Value Ref Range Status   C Diff antigen NEGATIVE NEGATIVE Final   C Diff toxin NEGATIVE NEGATIVE Final   C Diff interpretation Negative for toxigenic C. difficile  Final     Labs: Basic Metabolic Panel:  Recent  Labs Lab 08/13/15 2313 08/14/15 0419  NA 141 142  K 4.0 4.0  CL 105 109  CO2 25 21*  GLUCOSE 116* 97  BUN 23* 21*  CREATININE 2.39* 2.26*  CALCIUM 9.3 9.0   Liver Function Tests:  Recent Labs Lab 08/13/15 2313  AST 25  ALT 12*  ALKPHOS 70  BILITOT 0.7  PROT 6.6  ALBUMIN 3.7    Recent Labs Lab 08/13/15 2313  LIPASE 47   No results for input(s): AMMONIA in the last 168 hours. CBC:  Recent Labs Lab 08/13/15 2313 08/14/15 0419  WBC 8.4 8.1  HGB 14.9 13.9  HCT 43.5 41.4  MCV 90.1 90.2  PLT 218 218   Cardiac Enzymes:  Recent Labs Lab 08/13/15 0257  CKTOTAL 501*   BNP: BNP (last 3 results) No results for input(s): BNP in the last 8760 hours.  ProBNP (last 3 results) No results for input(s): PROBNP in the last 8760 hours.  CBG: No results for input(s): GLUCAP in the last 168 hours.  Signed:  Velvet Bathe MD.  [facility] 08/14/2015, 1:32 PM    33

## 2015-08-14 NOTE — ED Provider Notes (Signed)
CSN: [id number]     Arrival date & time 08/13/15  2211 History  By signing my name below, I, [name], attest that this documentation has been prepared under the direction and in the presence of Noemi Chapel, MD . Electronically Signed: [name], Scribe. 08/14/2015. 3:18 AM.   Chief Complaint  Patient presents with  . Back Pain   The history is provided by the patient. No language interpreter was used.   HPI Comments:  William Gregory is a 30 y.o. male with no pertinent PMHx who presents to the Emergency Department complaining of constant, moderate middle back pain for the past week. Pt reports associated burning and cramping in thighs, BL ear pain, tea-colored urine, and mild abdominal pain. Pt usually takes 2 Tylenol with mild relief of pain. He states he has been walking across town for ~1 hr at least once a day frequently. Symptoms alleviated with rest. 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 Pt reports moderate ETOH use. Pt denies current medication, use of cocaine, difficulty urinating, or h/o kidney problems.  Past Medical History  Diagnosis Date  . Knee joint pain, left   . Psychiatric problem     unspecified by patient (receives "injections" bi-weekly)  . Asthma   . Depression   . Tobacco abuse   . Alcohol abuse   . Back pain    History reviewed. No pertinent past surgical history. No family history on file. Social History  Substance Use Topics  . Smoking status: Current Every Day Smoker -- 1.00 packs/day    Types: Cigarettes  . Smokeless tobacco: Never Used  . Alcohol Use: Yes     Comment: daily, 3- 40 oz beers    Review of Systems  HENT: Positive for ear pain.   Genitourinary: Negative for difficulty urinating.       Dark urine  Musculoskeletal: Positive for back pain and arthralgias.  All other systems reviewed and are negative.  Allergies  Ibuprofen  Home Medications   Prior to Admission medications   Medication Sig  Start Date End Date Taking? Authorizing Provider  albuterol (PROVENTIL HFA;VENTOLIN HFA) 108 (90 BASE) MCG/ACT inhaler Inhale 2 puffs into the lungs every 2 (two) hours as needed for wheezing or shortness of breath (cough). Patient not taking: Reported on 08/14/2015 11/03/14   Mercedes Camprubi-Soms, PA-C  fluticasone (FLONASE) 50 MCG/ACT nasal spray Place 2 sprays into both nostrils daily. Patient not taking: Reported on 08/14/2015 11/03/14   Mercedes Camprubi-Soms, PA-C  methocarbamol (ROBAXIN) 500 MG tablet Take 1 tablet (500 mg total) by mouth 2 (two) times daily. Patient not taking: Reported on 08/14/2015 03/12/15   Delsa Grana, PA-C  metoCLOPramide (REGLAN) 10 MG tablet Take 1 tablet (10 mg total) by mouth every 6 (six) hours as needed (nausea/headache). Patient not taking: Reported on 08/14/2015 05/11/12   Wandra Arthurs, MD  naproxen (NAPROSYN) 500 MG tablet Take 1 tablet (500 mg total) by mouth 2 (two) times daily with a meal. Patient not taking: Reported on 08/14/2015 03/12/15   Delsa Grana, PA-C  predniSONE (DELTASONE) 20 MG tablet Take 2 tablets (40 mg total) by mouth daily. Patient not taking: Reported on 08/14/2015 03/12/15   Delsa Grana, PA-C  traMADol (ULTRAM) 50 MG tablet Take 1 tablet (50 mg total) by mouth every 6 (six) hours as needed. Patient not taking: Reported on 08/14/2015 03/12/15   Delsa Grana, PA-C   BP 145/85 mmHg  Pulse 63  Temp(Src) 98  F (36.7 C) (Oral)  Resp 18  SpO2 95% Physical Exam  Constitutional: He appears well-developed and well-nourished. No distress.  HENT:  Head: Normocephalic and atraumatic.  Mouth/Throat: Oropharynx is clear and moist. No oropharyngeal exudate.  Eyes: Conjunctivae and EOM are normal. Pupils are equal, round, and reactive to light. Right eye exhibits no discharge. Left eye exhibits no discharge. No scleral icterus.  Neck: Normal range of motion. Neck supple. No JVD present. No thyromegaly present.  Cardiovascular: Normal rate, regular rhythm,  normal heart sounds and intact distal pulses.  Exam reveals no gallop and no friction rub.   No murmur heard. Pulmonary/Chest: Effort normal and breath sounds normal. No respiratory distress. He has no wheezes. He has no rales.  Abdominal: Soft. Bowel sounds are normal. He exhibits no distension and no mass. There is no tenderness.  Musculoskeletal: Normal range of motion. He exhibits no edema.  Mild TTP over thighs and mid back paraspinal muscles  Lymphadenopathy:    He has no cervical adenopathy.  Neurological: He is alert. Coordination normal.  Skin: Skin is warm and dry. No rash noted. No erythema.  Psychiatric: He has a normal mood and affect. His behavior is normal.  Nursing note and vitals reviewed.  ED Course  Procedures  DIAGNOSTIC STUDIES:  Oxygen Saturation is 95% on RA, adequate by my interpretation.    COORDINATION OF CARE:  2:53 AM Discussed treatment plan with pt at bedside and pt agreed to plan.  Labs Review Labs Reviewed  COMPREHENSIVE METABOLIC PANEL - Abnormal; Notable for the following:    Glucose, Bld 116 (*)    BUN 23 (*)    Creatinine, Ser 2.39 (*)    ALT 12 (*)    GFR calc non Af Amer 34 (*)    GFR calc Af Amer 40 (*)    All other components within normal limits  URINALYSIS, ROUTINE W REFLEX MICROSCOPIC (NOT AT [facility]) - Abnormal; Notable for the following:    Hgb urine dipstick TRACE (*)    Protein, ur 100 (*)    All other components within normal limits  URINE MICROSCOPIC-ADD ON - Abnormal; Notable for the following:    Squamous Epithelial / LPF 0-5 (*)    Bacteria, UA RARE (*)    All other components within normal limits  CK - Abnormal; Notable for the following:    Total CK 501 (*)    All other components within normal limits  C DIFFICILE QUICK SCREEN W PCR REFLEX  GASTROINTESTINAL PANEL BY PCR, STOOL (REPLACES STOOL CULTURE)  LIPASE, BLOOD  CBC  CREATININE, URINE, RANDOM  SODIUM, URINE, RANDOM  URINE RAPID DRUG SCREEN, HOSP PERFORMED   PROTIME-INR  BASIC METABOLIC PANEL  CBC    Imaging Review No results found. I have personally reviewed and evaluated these images and lab results as part of my medical decision-making.    MDM   Final diagnoses:  Acute kidney injury [facility])    The patient has had acute kidney injury based on lab work. No signs of urinary infection or rhabdomyolysis. The patient has received 2 L of IV fluids, he will need to be admitted to the hospital for monitoring of his creatinine, discussed with the hospitalist who is agreeable and requests holding orders for observational admission to a MedSurg floor.    I personally performed the services described in this documentation, which was scribed in my presence. The recorded information has been reviewed and is accurate.       Noemi Chapel,  MD 08/14/15 [id number]

## 2015-08-17 ENCOUNTER — Encounter (HOSPITAL_COMMUNITY): Payer: Self-pay

## 2015-08-17 ENCOUNTER — Emergency Department (HOSPITAL_COMMUNITY)
Admission: EM | Admit: 2015-08-17 | Discharge: 2015-08-17 | Disposition: A | Discharge status: Home / Self Care | Payer: Medicaid Other | Attending: Emergency Medicine | Admitting: Emergency Medicine

## 2015-08-17 DIAGNOSIS — R110 Nausea: Secondary | ICD-10-CM | POA: Diagnosis not present

## 2015-08-17 DIAGNOSIS — R197 Diarrhea, unspecified: Secondary | ICD-10-CM | POA: Insufficient documentation

## 2015-08-17 DIAGNOSIS — F17210 Nicotine dependence, cigarettes, uncomplicated: Secondary | ICD-10-CM | POA: Insufficient documentation

## 2015-08-17 DIAGNOSIS — J45909 Unspecified asthma, uncomplicated: Secondary | ICD-10-CM | POA: Diagnosis not present

## 2015-08-17 DIAGNOSIS — R109 Unspecified abdominal pain: Secondary | ICD-10-CM | POA: Diagnosis present

## 2015-08-17 LAB — CBC
HEMATOCRIT: 45.9 % (ref 39.0–52.0)
HEMOGLOBIN: 15.2 g/dL (ref 13.0–17.0)
MCH: 29.9 pg (ref 26.0–34.0)
MCHC: 33.1 g/dL (ref 30.0–36.0)
MCV: 90.2 fL (ref 78.0–100.0)
Platelets: 242 10*3/uL (ref 150–400)
RBC: 5.09 MIL/uL (ref 4.22–5.81)
RDW: 13.4 % (ref 11.5–15.5)
WBC: 6.7 10*3/uL (ref 4.0–10.5)

## 2015-08-17 LAB — COMPREHENSIVE METABOLIC PANEL
ALBUMIN: 3.7 g/dL (ref 3.5–5.0)
ALT: 14 U/L — ABNORMAL LOW (ref 17–63)
ANION GAP: 12 (ref 5–15)
AST: 24 U/L (ref 15–41)
Alkaline Phosphatase: 73 U/L (ref 38–126)
BUN: 23 mg/dL — ABNORMAL HIGH (ref 6–20)
CHLORIDE: 108 mmol/L (ref 101–111)
CO2: 22 mmol/L (ref 22–32)
Calcium: 9.6 mg/dL (ref 8.9–10.3)
Creatinine, Ser: 2.27 mg/dL — ABNORMAL HIGH (ref 0.61–1.24)
GFR calc Af Amer: 42 mL/min — ABNORMAL LOW (ref >60)
GFR calc non Af Amer: 36 mL/min — ABNORMAL LOW (ref >60)
GLUCOSE: 96 mg/dL (ref 65–99)
POTASSIUM: 4.2 mmol/L (ref 3.5–5.1)
SODIUM: 142 mmol/L (ref 135–145)
TOTAL PROTEIN: 7.1 g/dL (ref 6.5–8.1)
Total Bilirubin: 0.9 mg/dL (ref 0.3–1.2)

## 2015-08-17 LAB — LIPASE, BLOOD: LIPASE: 39 U/L (ref 11–51)

## 2015-08-17 NOTE — ED Notes (Signed)
Patient here with ongoing abdominal cramping with some nausea and diarrhea. No distress. Reports that he was just discharged from hospital 2 days ago for same

## 2015-08-19 ENCOUNTER — Encounter (HOSPITAL_COMMUNITY): Payer: Self-pay

## 2015-08-19 DIAGNOSIS — R7989 Other specified abnormal findings of blood chemistry: Secondary | ICD-10-CM | POA: Insufficient documentation

## 2015-08-19 DIAGNOSIS — M79659 Pain in unspecified thigh: Secondary | ICD-10-CM | POA: Diagnosis not present

## 2015-08-19 DIAGNOSIS — R1084 Generalized abdominal pain: Secondary | ICD-10-CM | POA: Diagnosis not present

## 2015-08-19 DIAGNOSIS — R110 Nausea: Secondary | ICD-10-CM | POA: Diagnosis not present

## 2015-08-19 DIAGNOSIS — Z8659 Personal history of other mental and behavioral disorders: Secondary | ICD-10-CM | POA: Insufficient documentation

## 2015-08-19 DIAGNOSIS — M545 Low back pain: Secondary | ICD-10-CM | POA: Insufficient documentation

## 2015-08-19 DIAGNOSIS — J45909 Unspecified asthma, uncomplicated: Secondary | ICD-10-CM | POA: Diagnosis not present

## 2015-08-19 DIAGNOSIS — R1011 Right upper quadrant pain: Secondary | ICD-10-CM | POA: Insufficient documentation

## 2015-08-19 LAB — CBC
HEMATOCRIT: 45 % (ref 39.0–52.0)
Hemoglobin: 14.9 g/dL (ref 13.0–17.0)
MCH: 30.3 pg (ref 26.0–34.0)
MCHC: 33.1 g/dL (ref 30.0–36.0)
MCV: 91.5 fL (ref 78.0–100.0)
Platelets: 243 10*3/uL (ref 150–400)
RBC: 4.92 MIL/uL (ref 4.22–5.81)
RDW: 13.4 % (ref 11.5–15.5)
WBC: 8.3 10*3/uL (ref 4.0–10.5)

## 2015-08-19 LAB — COMPREHENSIVE METABOLIC PANEL
ALBUMIN: 3.6 g/dL (ref 3.5–5.0)
ALT: 13 U/L — ABNORMAL LOW (ref 17–63)
AST: 26 U/L (ref 15–41)
Alkaline Phosphatase: 73 U/L (ref 38–126)
Anion gap: 11 (ref 5–15)
BUN: 24 mg/dL — AB (ref 6–20)
CHLORIDE: 105 mmol/L (ref 101–111)
CO2: 26 mmol/L (ref 22–32)
Calcium: 9.8 mg/dL (ref 8.9–10.3)
Creatinine, Ser: 2.36 mg/dL — ABNORMAL HIGH (ref 0.61–1.24)
GFR calc Af Amer: 40 mL/min — ABNORMAL LOW (ref >60)
GFR calc non Af Amer: 35 mL/min — ABNORMAL LOW (ref >60)
GLUCOSE: 112 mg/dL — AB (ref 65–99)
POTASSIUM: 3.6 mmol/L (ref 3.5–5.1)
Sodium: 142 mmol/L (ref 135–145)
Total Bilirubin: 0.5 mg/dL (ref 0.3–1.2)
Total Protein: 7 g/dL (ref 6.5–8.1)

## 2015-08-19 LAB — URINALYSIS, ROUTINE W REFLEX MICROSCOPIC
BILIRUBIN URINE: NEGATIVE
GLUCOSE, UA: NEGATIVE mg/dL
Hgb urine dipstick: NEGATIVE
KETONES UR: NEGATIVE mg/dL
LEUKOCYTES UA: NEGATIVE
Nitrite: NEGATIVE
PH: 5.5 (ref 5.0–8.0)
Protein, ur: >300 mg/dL — AB
Specific Gravity, Urine: 1.025 (ref 1.005–1.030)

## 2015-08-19 LAB — LIPASE, BLOOD: LIPASE: 46 U/L (ref 11–51)

## 2015-08-19 LAB — URINE MICROSCOPIC-ADD ON
RBC / HPF: NONE SEEN RBC/hpf (ref 0–5)
WBC, UA: NONE SEEN WBC/hpf (ref 0–5)

## 2015-08-19 NOTE — ED Notes (Addendum)
Pt here with c/o back pain and bilateral side pain. He gets a monthly IM injection at [facility] "to keep me calm." Last injection was this morning at 10 to left deltoid. He states the pain to his sides and back started after the injection. Pt also reports abdominal pain and diarrhea.

## 2015-08-20 ENCOUNTER — Emergency Department (HOSPITAL_COMMUNITY)
Admission: EM | Admit: 2015-08-20 | Discharge: 2015-08-20 | Disposition: A | Discharge status: Home / Self Care | Payer: Medicaid Other | Attending: Emergency Medicine | Admitting: Emergency Medicine

## 2015-08-20 DIAGNOSIS — M545 Low back pain, unspecified: Secondary | ICD-10-CM

## 2015-08-20 DIAGNOSIS — R7989 Other specified abnormal findings of blood chemistry: Secondary | ICD-10-CM

## 2015-08-20 DIAGNOSIS — R1011 Right upper quadrant pain: Secondary | ICD-10-CM

## 2015-08-20 DIAGNOSIS — R110 Nausea: Secondary | ICD-10-CM

## 2015-08-20 MED ORDER — ONDANSETRON 4 MG PO TBDP: 4.0000 mg | ORAL_TABLET | Freq: Three times a day (TID) | ORAL | Status: DC | PRN

## 2015-08-20 MED ORDER — CYCLOBENZAPRINE HCL 10 MG PO TABS: 10.0000 mg | ORAL_TABLET | Freq: Two times a day (BID) | ORAL | Status: DC | PRN

## 2015-08-20 MED ORDER — DICYCLOMINE HCL 20 MG PO TABS: 20.0000 mg | ORAL_TABLET | Freq: Two times a day (BID) | ORAL | Status: DC

## 2015-08-20 MED ORDER — TRAMADOL HCL 50 MG PO TABS
50.0000 mg | ORAL_TABLET | Freq: Once | ORAL | Status: AC
  Administered 2015-08-20: 50 mg via ORAL
  Filled 2015-08-20: qty 1

## 2015-08-20 NOTE — ED Provider Notes (Signed)
CSN: [id number]     Arrival date & time 08/19/15  1824 History  By signing my name below, I, Evelene Croon, attest that this documentation has been prepared under the direction and in the presence of Gareth Morgan, MD . Electronically Signed: Evelene Croon, Scribe. 08/20/2015. 3:16 AM.  Chief Complaint  Patient presents with  . Back Pain  . Abdominal Pain    Patient is a 30 y.o. male presenting with back pain and abdominal pain. The history is provided by the patient. No language interpreter was used.  Back Pain Location:  Lumbar spine Pain severity:  Moderate Onset quality:  Gradual Timing:  Intermittent Progression:  Worsening Chronicity:  New Associated symptoms: abdominal pain   Associated symptoms: no bladder incontinence, no bowel incontinence, no chest pain, no dysuria, no fever, no headaches, no numbness and no weakness   Abdominal Pain Associated symptoms: nausea and vomiting (resolved)   Associated symptoms: no chest pain, no diarrhea, no dysuria, no fever, no shortness of breath and no sore throat      HPI Comments:  William Gregory is a 30 y.o. male who presents to the Emergency Department complaining of intermittent 10/10 lower back pain x a few days. His pain radiates to right abdomen; describes this pain as cramping. Pt reports associated nausea and 1 episode of vomiting. He also reports thigh pain. Pt denies diarrhea, numbness/weakness in his BLE, dysuria,  bowel/bladder incontinence and exacerbation of pain after eating. No alleviating factors noted.  Pt was seen in the ED on 08/14/15 for similar symptoms; notes pain today is more painful. Pt was admitted at that time for acute kidney injury.   Past Medical History  Diagnosis Date  . Knee joint pain, left   . Psychiatric problem     unspecified by patient (receives "injections" bi-weekly)  . Asthma   . Depression   . Tobacco abuse   . Alcohol abuse   . Back pain    History reviewed. No pertinent past  surgical history. Family History  Problem Relation Age of Onset  . Diabetes Mother   . Diabetes Father    Social History  Substance Use Topics  . Smoking status: Current Every Day Smoker -- 1.00 packs/day    Types: Cigarettes  . Smokeless tobacco: Never Used  . Alcohol Use: 21.0 oz/week    21 Cans of beer, 14 Shots of liquor per week     Comment: daily, 3- 40 oz beers    Review of Systems  Constitutional: Negative for fever.  HENT: Negative for sore throat.   Eyes: Negative for visual disturbance.  Respiratory: Negative for shortness of breath.   Cardiovascular: Negative for chest pain.  Gastrointestinal: Positive for nausea, vomiting (resolved) and abdominal pain. Negative for diarrhea and bowel incontinence.  Genitourinary: Negative for bladder incontinence, dysuria and difficulty urinating.  Musculoskeletal: Positive for myalgias and back pain. Negative for neck stiffness.  Skin: Negative for rash.  Neurological: Negative for syncope, weakness, numbness and headaches.  All other systems reviewed and are negative.  Allergies  Ibuprofen  Home Medications   Prior to Admission medications   Medication Sig Start Date End Date Taking? Authorizing Provider  acetaminophen (TYLENOL) 325 MG tablet Take 2 tablets (650 mg total) by mouth every 6 (six) hours as needed for mild pain (or Fever >/= 101). Patient not taking: Reported on 08/20/2015 08/14/15   Velvet Bathe, MD  cyclobenzaprine (FLEXERIL) 10 MG tablet Take 1 tablet (10 mg total) by mouth 2 (two) times  daily as needed for muscle spasms. 08/20/15   Gareth Morgan, MD  dicyclomine (BENTYL) 20 MG tablet Take 1 tablet (20 mg total) by mouth 2 (two) times daily. 08/20/15   Gareth Morgan, MD  ondansetron (ZOFRAN ODT) 4 MG disintegrating tablet Take 1 tablet (4 mg total) by mouth every 8 (eight) hours as needed for nausea or vomiting. 08/20/15   Gareth Morgan, MD   BP 130/72 mmHg  Pulse 87  Temp(Src) 99 F (37.2 C) (Oral)  Resp  19  SpO2 99% Physical Exam  Constitutional: He is oriented to person, place, and time. He appears well-developed and well-nourished. No distress.  HENT:  Head: Normocephalic and atraumatic.  Eyes: Conjunctivae and EOM are normal.  Neck: Normal range of motion.  Cardiovascular: Normal rate, regular rhythm, normal heart sounds and intact distal pulses.  Exam reveals no gallop and no friction rub.   No murmur heard. Pulmonary/Chest: Effort normal and breath sounds normal. No respiratory distress. He has no wheezes. He has no rales.  Abdominal: Soft. He exhibits no distension. There is tenderness (mild diffuse). There is no guarding.  Musculoskeletal: He exhibits no edema.       Lumbar back: He exhibits tenderness. He exhibits no bony tenderness.  Neurological: He is alert and oriented to person, place, and time.  5/5 strength lower extremities  Skin: Skin is warm and dry. He is not diaphoretic.  Nursing note and vitals reviewed.   ED Course  Procedures   DIAGNOSTIC STUDIES:  Oxygen Saturation is 98% on RA, normal by my interpretation.    COORDINATION OF CARE:  3:09 AM Pt update with partial results. Discussed treatment plan with pt at bedside and pt agreed to plan.  Labs Review Labs Reviewed  COMPREHENSIVE METABOLIC PANEL - Abnormal; Notable for the following:    Glucose, Bld 112 (*)    BUN 24 (*)    Creatinine, Ser 2.36 (*)    ALT 13 (*)    GFR calc non Af Amer 35 (*)    GFR calc Af Amer 40 (*)    All other components within normal limits  URINALYSIS, ROUTINE W REFLEX MICROSCOPIC (NOT AT [facility]) - Abnormal; Notable for the following:    Protein, ur >300 (*)    All other components within normal limits  URINE MICROSCOPIC-ADD ON - Abnormal; Notable for the following:    Squamous Epithelial / LPF 0-5 (*)    Bacteria, UA RARE (*)    All other components within normal limits  LIPASE, BLOOD  CBC    Imaging Review No results found. I have personally reviewed and evaluated  these images and lab results as part of my medical decision-making.   EKG Interpretation None      MDM  Kidney function stable. Recommend pt follow up with PCP. Will discharge with nephrology referral.  Final diagnoses:  Right upper quadrant pain and middle abdominal pain  Right-sided low back pain without sciatica  Nausea  Elevated serum creatinine, found on recent labwork with admission, unchanged from that time    30yo male with history of recent hospitalization for elevated creatinine, assumed AKI from recent nausea/vomiting diarrhea presents with concern for abdominal pain and back pain.  Patient reports abdominal pain is similar to pain he had 1 week ago when he had CT completed of his abdomen which did not show acute pathology.  Discussed that based on location of symptoms, would consider gallbladder pathology, however recent CT did not show gallbladder pathology, symptoms not worse with  eating.  No sign of nephrolithiasis on recent scan.  Abdominal exam benign, doubt cholecystitis, appendicitis, diverticulitis. Back pain without red flags.  Recommend PCP follow up as well as nephrology follow up and avoidance of NSAIDs.  Given rx for zofran, bentyl, and flexeril for back pain.  I personally performed the services described in this documentation, which was scribed in my presence. The recorded information has been reviewed and is accurate.   Gareth Morgan, MD 08/20/15 [phone]

## 2015-08-20 NOTE — Discharge Instructions (Signed)

## 2015-11-02 ENCOUNTER — Encounter (HOSPITAL_COMMUNITY): Payer: Self-pay | Admitting: *Deleted

## 2015-11-02 ENCOUNTER — Emergency Department (HOSPITAL_COMMUNITY)
Admission: EM | Admit: 2015-11-02 | Discharge: 2015-11-02 | Disposition: A | Discharge status: Home / Self Care | Payer: Medicaid Other | Attending: Emergency Medicine | Admitting: Emergency Medicine

## 2015-11-02 DIAGNOSIS — F17210 Nicotine dependence, cigarettes, uncomplicated: Secondary | ICD-10-CM | POA: Diagnosis not present

## 2015-11-02 DIAGNOSIS — B009 Herpesviral infection, unspecified: Secondary | ICD-10-CM | POA: Insufficient documentation

## 2015-11-02 DIAGNOSIS — J45909 Unspecified asthma, uncomplicated: Secondary | ICD-10-CM | POA: Diagnosis not present

## 2015-11-02 DIAGNOSIS — Z79899 Other long term (current) drug therapy: Secondary | ICD-10-CM | POA: Insufficient documentation

## 2015-11-02 DIAGNOSIS — Z8659 Personal history of other mental and behavioral disorders: Secondary | ICD-10-CM | POA: Diagnosis not present

## 2015-11-02 DIAGNOSIS — R220 Localized swelling, mass and lump, head: Secondary | ICD-10-CM | POA: Diagnosis present

## 2015-11-02 MED ORDER — ACYCLOVIR 400 MG PO TABS: 400.0000 mg | ORAL_TABLET | Freq: Three times a day (TID) | ORAL | Status: DC

## 2015-11-02 NOTE — Discharge Instructions (Signed)
Cold Sore A cold sore (fever blister) is a skin infection caused by the herpes simplex virus (HSV-1). HSV-1 is closely related to the virus that causes genital herpes (HSV-2), but they are not the same even though both viruses can cause oral and genital infections. Cold sores are small, fluid-filled sores inside of the mouth or on the lips, gums, nose, chin, cheeks, or fingers.  The herpes simplex virus can be easily passed (contagious) to other people through close personal contact, such as kissing or sharing personal items. The virus can also spread to other parts of the body, such as the eyes or genitals. Cold sores are contagious until the sores crust over completely. They often heal within 2 weeks.  Once a person is infected, the herpes simplex virus remains permanently in the body. Therefore, there is no cure for cold sores, and they often recur when a person is tired, stressed, sick, or gets too much sun. Additional factors that can cause a recurrence include hormone changes in menstruation or pregnancy, certain drugs, and cold weather.  CAUSES  Cold sores are caused by the herpes simplex virus. The virus is spread from person to person through close contact, such as through kissing, touching the affected area, or sharing personal items such as lip balm, razors, or eating utensils.  SYMPTOMS  The first infection may not cause symptoms. If symptoms develop, the symptoms often go through different stages. Here is how a cold sore develops:   Tingling, itching, or burning is felt 1-2 days before the outbreak.   Fluid-filled blisters appear on the lips, inside the mouth, nose, or on the cheeks.   The blisters start to ooze clear fluid.   The blisters dry up and a yellow crust appears in its place.   The crust falls off.  Symptoms depend on whether it is the initial outbreak or a recurrence. Some other symptoms with the first outbreak may include:   Fever.   Sore throat.   Headache.    Muscle aches.   Swollen neck glands.  DIAGNOSIS  A diagnosis is often made based on your symptoms and looking at the sores. Sometimes, a sore may be swabbed and then examined in the lab to make a final diagnosis. If the sores are not present, blood tests can find the herpes simplex virus.  TREATMENT  There is no cure for cold sores and no vaccine for the herpes simplex virus. Within 2 weeks, most cold sores go away on their own without treatment. Medicines cannot make the infection go away, but medicine can help relieve some of the pain associated with the sores, can work to stop the virus from multiplying, and can also shorten healing time. Medicine may be in the form of creams, gels, pills, or a shot.  HOME CARE INSTRUCTIONS   Only take over-the-counter or prescription medicines for pain, discomfort, or fever as directed by your caregiver. Do not use aspirin.   Use a cotton-tip swab to apply creams or gels to your sores.   Do not touch the sores or pick the scabs. Wash your hands often. Do not touch your eyes without washing your hands first.   Avoid kissing, oral sex, and sharing personal items until sores heal.   Apply an ice pack on your sores for 10-15 minutes to ease any discomfort.   Avoid hot, cold, or salty foods because they may hurt your mouth. Eat a soft, bland diet to avoid irritating the sores. Use a straw to drink   if you have pain when drinking out of a glass.   Keep sores clean and dry to prevent an infection of other tissues.   Avoid the sun and limit stress if these things trigger outbreaks. If sun causes cold sores, apply sunscreen on the lips before being out in the sun.  SEEK MEDICAL CARE IF:   You have a fever or persistent symptoms for more than 2-3 days.   You have a fever and your symptoms suddenly get worse.   You have pus, not clear fluid, coming from the sores.   You have redness that is spreading.   You have pain or irritation in your  eye.   You get sores on your genitals.   Your sores do not heal within 2 weeks.   You have a weakened immune system.   You have frequent recurrences of cold sores.  MAKE SURE YOU:   Understand these instructions.  Will watch your condition.  Will get help right away if you are not doing well or get worse.   This information is not intended to replace advice given to you by your health care provider. Make sure you discuss any questions you have with your health care provider.   Document Released: 05/19/2000 Document Revised: 06/12/2014 Document Reviewed: 10/04/2011 Elsevier Interactive Patient Education 2016 [employer]  

## 2015-11-02 NOTE — ED Provider Notes (Signed)
CSN: [id number]     Arrival date & time 11/02/15  2052 History  By signing my name below, I, William Gregory, attest that this documentation has been prepared under the direction and in the presence of Etta Quill, NP-C. Electronically Signed: Irene Gregory, ED Scribe. 11/02/2015. 10:03 PM.   No chief complaint on file.  Patient is a 30 y.o. male presenting with general illness. The history is provided by the patient. No language interpreter was used.  Illness Location:  Upper lip Quality:  Non-painful bumps Severity:  Mild Onset quality:  Sudden Duration:  2 days Timing:  Constant Progression:  Worsening Chronicity:  New Ineffective treatments:  None tried Associated symptoms: rash   Associated symptoms: no fever, no nausea, no sore throat and no vomiting   HPI Comments: Dontre E Gregory is a 30 y.o. male who presents to the Emergency Department complaining of a cluster of non-painful bumps to the upper lip onset 2 days ago. He believes that it looks like a fever sore. He has not tried anything for his symptoms. He denies fever or chills.   Past Medical History  Diagnosis Date  . Knee joint pain, left   . Psychiatric problem     unspecified by patient (receives "injections" bi-weekly)  . Asthma   . Depression   . Tobacco abuse   . Alcohol abuse   . Back pain    History reviewed. No pertinent past surgical history. Family History  Problem Relation Age of Onset  . Diabetes Mother   . Diabetes Father    Social History  Substance Use Topics  . Smoking status: Current Every Day Smoker -- 1.00 packs/day    Types: Cigarettes  . Smokeless tobacco: Never Used  . Alcohol Use: 21.0 oz/week    21 Cans of beer, 14 Shots of liquor per week     Comment: daily, 3- 40 oz beers    Review of Systems  Constitutional: Negative for fever and chills.  HENT: Negative for sore throat.   Gastrointestinal: Negative for nausea and vomiting.  Skin: Positive for rash.  All other systems  reviewed and are negative.  Allergies  Ibuprofen  Home Medications   Prior to Admission medications   Medication Sig Start Date End Date Taking? Authorizing Provider  acetaminophen (TYLENOL) 325 MG tablet Take 2 tablets (650 mg total) by mouth every 6 (six) hours as needed for mild pain (or Fever >/= 101). Patient not taking: Reported on 08/20/2015 08/14/15   Velvet Bathe, MD  cyclobenzaprine (FLEXERIL) 10 MG tablet Take 1 tablet (10 mg total) by mouth 2 (two) times daily as needed for muscle spasms. 08/20/15   Gareth Morgan, MD  dicyclomine (BENTYL) 20 MG tablet Take 1 tablet (20 mg total) by mouth 2 (two) times daily. 08/20/15   Gareth Morgan, MD  ondansetron (ZOFRAN ODT) 4 MG disintegrating tablet Take 1 tablet (4 mg total) by mouth every 8 (eight) hours as needed for nausea or vomiting. 08/20/15   Gareth Morgan, MD   BP 131/75 mmHg  Pulse 65  Temp(Src) 98.5 F (36.9 C) (Oral)  Wt 215 lb (97.523 kg)  SpO2 97% Physical Exam  Constitutional: He is oriented to person, place, and time. He appears well-developed and well-nourished.  HENT:  Head: Normocephalic and atraumatic.  Eyes: Conjunctivae are normal.  Neck: Normal range of motion. Neck supple.  Cardiovascular: Normal rate and regular rhythm.   Pulmonary/Chest: Effort normal and breath sounds normal.  Musculoskeletal: Normal range of motion.  Neurological: He  is alert and oriented to person, place, and time.  Skin: Skin is warm and dry.  Vesicular rash to the upper lip consistent with herpes  Psychiatric: He has a normal mood and affect. His behavior is normal.    ED Course  Procedures (including critical care time) DIAGNOSTIC STUDIES: Oxygen Saturation is 97% on RA, normal by my interpretation.    COORDINATION OF CARE: 10:01 PM-Discussed treatment plan which includes anti-viral medication with pt at bedside and pt agreed to plan.    Labs Review Labs Reviewed - No data to display  Imaging Review No results  found. I have personally reviewed and evaluated these images and lab results as part of my medical decision-making.   EKG Interpretation None      MDM   Final diagnoses:  None    Patient with herpes simplex. Patient will be discharged with Acyclovir.  No signs of secondary infection. No signs of disseminated herpes. Return precautions discussed. Pt is safe for discharge at this time.   I personally performed the services described in this documentation, which was scribed in my presence. The recorded information has been reviewed and is accurate.   Etta Quill, NP 11/03/15 [id number]  Milton Ferguson, MD 11/04/15 [phone]

## 2015-11-02 NOTE — ED Notes (Signed)
Pt arrives to ED to be evaluated for a cluster of bumps on his upper lip. Pt denies pain.

## 2015-11-02 NOTE — ED Notes (Signed)
Pt is in stable condition upon d/c and ambulates from ED. 

## 2015-11-02 NOTE — ED Notes (Signed)
Pt called for triage and no answer

## 2015-12-09 ENCOUNTER — Encounter (HOSPITAL_COMMUNITY): Payer: Self-pay | Admitting: Emergency Medicine

## 2015-12-09 DIAGNOSIS — R42 Dizziness and giddiness: Secondary | ICD-10-CM | POA: Insufficient documentation

## 2015-12-09 DIAGNOSIS — J45909 Unspecified asthma, uncomplicated: Secondary | ICD-10-CM | POA: Insufficient documentation

## 2015-12-09 DIAGNOSIS — E860 Dehydration: Secondary | ICD-10-CM | POA: Insufficient documentation

## 2015-12-09 DIAGNOSIS — Z79899 Other long term (current) drug therapy: Secondary | ICD-10-CM | POA: Insufficient documentation

## 2015-12-09 DIAGNOSIS — F17210 Nicotine dependence, cigarettes, uncomplicated: Secondary | ICD-10-CM | POA: Diagnosis not present

## 2015-12-09 DIAGNOSIS — K219 Gastro-esophageal reflux disease without esophagitis: Secondary | ICD-10-CM | POA: Insufficient documentation

## 2015-12-09 LAB — URINALYSIS, ROUTINE W REFLEX MICROSCOPIC
BILIRUBIN URINE: NEGATIVE
Glucose, UA: NEGATIVE mg/dL
HGB URINE DIPSTICK: NEGATIVE
Ketones, ur: NEGATIVE mg/dL
Leukocytes, UA: NEGATIVE
Nitrite: NEGATIVE
PH: 5 (ref 5.0–8.0)
Protein, ur: 100 mg/dL — AB
SPECIFIC GRAVITY, URINE: 1.018 (ref 1.005–1.030)

## 2015-12-09 LAB — CBC WITH DIFFERENTIAL/PLATELET
Basophils Absolute: 0 10*3/uL (ref 0.0–0.1)
Basophils Relative: 0 %
EOS ABS: 0.1 10*3/uL (ref 0.0–0.7)
Eosinophils Relative: 2 %
HEMATOCRIT: 36.1 % — AB (ref 39.0–52.0)
HEMOGLOBIN: 12.3 g/dL — AB (ref 13.0–17.0)
LYMPHS ABS: 2.2 10*3/uL (ref 0.7–4.0)
Lymphocytes Relative: 42 %
MCH: 30.8 pg (ref 26.0–34.0)
MCHC: 34.1 g/dL (ref 30.0–36.0)
MCV: 90.5 fL (ref 78.0–100.0)
MONOS PCT: 6 %
Monocytes Absolute: 0.3 10*3/uL (ref 0.1–1.0)
NEUTROS PCT: 50 %
Neutro Abs: 2.7 10*3/uL (ref 1.7–7.7)
Platelets: 199 10*3/uL (ref 150–400)
RBC: 3.99 MIL/uL — ABNORMAL LOW (ref 4.22–5.81)
RDW: 13.1 % (ref 11.5–15.5)
WBC: 5.4 10*3/uL (ref 4.0–10.5)

## 2015-12-09 LAB — BASIC METABOLIC PANEL
Anion gap: 7 (ref 5–15)
BUN: 17 mg/dL (ref 6–20)
CHLORIDE: 108 mmol/L (ref 101–111)
CO2: 24 mmol/L (ref 22–32)
CREATININE: 2.67 mg/dL — AB (ref 0.61–1.24)
Calcium: 9.6 mg/dL (ref 8.9–10.3)
GFR calc Af Amer: 35 mL/min — ABNORMAL LOW (ref >60)
GFR calc non Af Amer: 30 mL/min — ABNORMAL LOW (ref >60)
Glucose, Bld: 111 mg/dL — ABNORMAL HIGH (ref 65–99)
Potassium: 3.5 mmol/L (ref 3.5–5.1)
SODIUM: 139 mmol/L (ref 135–145)

## 2015-12-09 LAB — URINE MICROSCOPIC-ADD ON: RBC / HPF: NONE SEEN RBC/hpf (ref 0–5)

## 2015-12-09 NOTE — ED Notes (Signed)
Pt. reports gastric reflux with mild dizziness while at work this afternoon , denies emesis , no fever or chills.

## 2015-12-10 ENCOUNTER — Emergency Department (HOSPITAL_COMMUNITY)
Admission: EM | Admit: 2015-12-10 | Discharge: 2015-12-10 | Disposition: A | Discharge status: Home / Self Care | Payer: Medicaid Other | Attending: Emergency Medicine | Admitting: Emergency Medicine

## 2015-12-10 DIAGNOSIS — K219 Gastro-esophageal reflux disease without esophagitis: Secondary | ICD-10-CM

## 2015-12-10 DIAGNOSIS — E860 Dehydration: Secondary | ICD-10-CM

## 2015-12-10 MED ORDER — OMEPRAZOLE 20 MG PO CPDR: 20.0000 mg | DELAYED_RELEASE_CAPSULE | Freq: Every day | ORAL | Status: DC

## 2015-12-10 MED ORDER — SODIUM CHLORIDE 0.9 % IV BOLUS (SEPSIS)
1000.0000 mL | Freq: Once | INTRAVENOUS | Status: AC
  Administered 2015-12-10: 1000 mL via INTRAVENOUS

## 2015-12-10 MED ORDER — ACETAMINOPHEN 325 MG PO TABS
325.0000 mg | ORAL_TABLET | Freq: Once | ORAL | Status: AC
  Administered 2015-12-10: 325 mg via ORAL
  Filled 2015-12-10: qty 1

## 2015-12-10 NOTE — ED Notes (Signed)
Attempt to find pt.  Called her in waiting area.  No response

## 2015-12-10 NOTE — ED Notes (Signed)
Call for pt to take her to room, no answer

## 2015-12-10 NOTE — Discharge Instructions (Signed)
Dehydration, Adult °Dehydration is a condition in which you do not have enough fluid or water in your body. It happens when you take in less fluid than you lose. Vital organs such as the kidneys, brain, and heart cannot function without a proper amount of fluids. Any loss of fluids from the body can cause dehydration.  °Dehydration can range from mild to severe. This condition should be treated right away to help prevent it from becoming severe. °CAUSES  °This condition may be caused by: °· Vomiting. °· Diarrhea. °· Excessive sweating, such as when exercising in hot or humid weather. °· Not drinking enough fluid during strenuous exercise or during an illness. °· Excessive urine output. °· Fever. °· Certain medicines. °RISK FACTORS °This condition is more likely to develop in: °· People who are taking certain medicines that cause the body to lose excess fluid (diuretics).   °· People who have a chronic illness, such as diabetes, that may increase urination. °· Older adults.   °· People who live at high altitudes.   °· People who participate in endurance sports.   °SYMPTOMS  °Mild Dehydration °· Thirst. °· Dry lips. °· Slightly dry mouth. °· Dry, warm skin. °Moderate Dehydration °· Very dry mouth.   °· Muscle cramps.   °· Dark urine and decreased urine production.   °· Decreased tear production.   °· Headache.   °· Light-headedness, especially when you stand up from a sitting position.   °Severe Dehydration °· Changes in skin.   °¨ Cold and clammy skin.   °¨ Skin does not spring back quickly when lightly pinched and released.   °· Changes in body fluids.   °¨ Extreme thirst.   °¨ No tears.   °¨ Not able to sweat when body temperature is high, such as in hot weather.   °¨ Minimal urine production.   °· Changes in vital signs.   °¨ Rapid, weak pulse (more than 100 beats per minute when you are sitting still).   °¨ Rapid breathing.   °¨ Low blood pressure.   °· Other changes.   °¨ Sunken eyes.   °¨ Cold hands and feet.    °¨ Confusion. °¨ Lethargy and difficulty being awakened. °¨ Fainting (syncope).   °¨ Short-term weight loss.   °¨ Unconsciousness. °DIAGNOSIS  °This condition may be diagnosed based on your symptoms. You may also have tests to determine how severe your dehydration is. These tests may include:  °· Urine tests.   °· Blood tests.   °TREATMENT  °Treatment for this condition depends on the severity. Mild or moderate dehydration can often be treated at home. Treatment should be started right away. Do not wait until dehydration becomes severe. Severe dehydration needs to be treated at the hospital. °Treatment for Mild Dehydration °· Drinking plenty of water to replace the fluid you have lost.   °· Replacing minerals in your blood (electrolytes) that you may have lost.   °Treatment for Moderate Dehydration  °· Consuming oral rehydration solution (ORS). °Treatment for Severe Dehydration °· Receiving fluid through an IV tube.   °· Receiving electrolyte solution through a feeding tube that is passed through your nose and into your stomach (nasogastric tube or NG tube). °· Correcting any abnormalities in electrolytes. °HOME CARE INSTRUCTIONS  °· Drink enough fluid to keep your urine clear or pale yellow.   °· Drink water or fluid slowly by taking small sips. You can also try sucking on ice cubes.  °· Have food or beverages that contain electrolytes. Examples include bananas and sports drinks. °· Take over-the-counter and prescription medicines only as told by your health care provider.   °· Prepare ORS according to the manufacturer's instructions. Take sips   of ORS every 5 minutes until your urine returns to normal.  If you have vomiting or diarrhea, continue to try to drink water, ORS, or both.   If you have diarrhea, avoid:   Beverages that contain caffeine.   Fruit juice.   Milk.   Carbonated soft drinks.  Do not take salt tablets. This can lead to the condition of having too much sodium in your body  (hypernatremia).  SEEK MEDICAL CARE IF:  You cannot eat or drink without vomiting.  You have had moderate diarrhea during a period of more than 24 hours.  You have a fever. SEEK IMMEDIATE MEDICAL CARE IF:   You have extreme thirst.  You have severe diarrhea.  You have not urinated in 6-8 hours, or you have urinated only a small amount of very dark urine.  You have shriveled skin.  You are dizzy, confused, or both.   This information is not intended to replace advice given to you by your health care provider. Make sure you discuss any questions you have with your health care provider.   Document Released: 05/22/2005 Document Revised: 02/10/2015 Document Reviewed: 10/07/2014 Elsevier Interactive Patient Education 2016 [place]. Gastroesophageal Reflux Disease, Adult Normally, food travels down the esophagus and stays in the stomach to be digested. However, when a person has gastroesophageal reflux disease (GERD), food and stomach acid move back up into the esophagus. When this happens, the esophagus becomes sore and inflamed. Over time, GERD can create small holes (ulcers) in the lining of the esophagus.  CAUSES This condition is caused by a problem with the muscle between the esophagus and the stomach (lower esophageal sphincter, or LES). Normally, the LES muscle closes after food passes through the esophagus to the stomach. When the LES is weakened or abnormal, it does not close properly, and that allows food and stomach acid to go back up into the esophagus. The LES can be weakened by certain dietary substances, medicines, and medical conditions, including:  Tobacco use.  Pregnancy.  Having a hiatal hernia.  Heavy alcohol use.  Certain foods and beverages, such as coffee, chocolate, onions, and peppermint. RISK FACTORS This condition is more likely to develop in:  People who have an increased body weight.  People who have connective tissue disorders.  People who  use NSAID medicines. SYMPTOMS Symptoms of this condition include:  Heartburn.  Difficult or painful swallowing.  The feeling of having a lump in the throat.  Abitter taste in the mouth.  Bad breath.  Having a large amount of saliva.  Having an upset or bloated stomach.  Belching.  Chest pain.  Shortness of breath or wheezing.  Ongoing (chronic) cough or a night-time cough.  Wearing away of tooth enamel.  Weight loss. Different conditions can cause chest pain. Make sure to see your health care provider if you experience chest pain. DIAGNOSIS Your health care provider will take a medical history and perform a physical exam. To determine if you have mild or severe GERD, your health care provider may also monitor how you respond to treatment. You may also have other tests, including:  An endoscopy toexamine your stomach and esophagus with a small camera.  A test thatmeasures the acidity level in your esophagus.  A test thatmeasures how much pressure is on your esophagus.  A barium swallow or modified barium swallow to show the shape, size, and functioning of your esophagus. TREATMENT The goal of treatment is to help relieve your symptoms and to prevent complications.  Treatment for this condition may vary depending on how severe your symptoms are. Your health care provider may recommend:  Changes to your diet.  Medicine.  Surgery. HOME CARE INSTRUCTIONS Diet  Follow a diet as recommended by your health care provider. This may involve avoiding foods and drinks such as:  Coffee and tea (with or without caffeine).  Drinks that containalcohol.  Energy drinks and sports drinks.  Carbonated drinks or sodas.  Chocolate and cocoa.  Peppermint and mint flavorings.  Garlic and onions.  Horseradish.  Spicy and acidic foods, including peppers, chili powder, curry powder, vinegar, hot sauces, and barbecue sauce.  Citrus fruit juices and citrus fruits, such as  oranges, lemons, and limes.  Tomato-based foods, such as red sauce, chili, salsa, and pizza with red sauce.  Fried and fatty foods, such as donuts, french fries, potato chips, and high-fat dressings.  High-fat meats, such as hot dogs and fatty cuts of red and white meats, such as rib eye steak, sausage, ham, and bacon.  High-fat dairy items, such as whole milk, butter, and cream cheese.  Eat small, frequent meals instead of large meals.  Avoid drinking large amounts of liquid with your meals.  Avoid eating meals during the 2-3 hours before bedtime.  Avoid lying down right after you eat.  Do not exercise right after you eat. General Instructions  Pay attention to any changes in your symptoms.  Take over-the-counter and prescription medicines only as told by your health care provider. Do not take aspirin, ibuprofen, or other NSAIDs unless your health care provider told you to do so.  Do not use any tobacco products, including cigarettes, chewing tobacco, and e-cigarettes. If you need help quitting, ask your health care provider.  Wear loose-fitting clothing. Do not wear anything tight around your waist that causes pressure on your abdomen.  Raise (elevate) the head of your bed 6 inches (15cm).  Try to reduce your stress, such as with yoga or meditation. If you need help reducing stress, ask your health care provider.  If you are overweight, reduce your weight to an amount that is healthy for you. Ask your health care provider for guidance about a safe weight loss goal.  Keep all follow-up visits as told by your health care provider. This is important. SEEK MEDICAL CARE IF:  You have new symptoms.  You have unexplained weight loss.  You have difficulty swallowing, or it hurts to swallow.  You have wheezing or a persistent cough.  Your symptoms do not improve with treatment.  You have a hoarse voice. SEEK IMMEDIATE MEDICAL CARE IF:  You have pain in your arms, neck,  jaw, teeth, or back.  You feel sweaty, dizzy, or light-headed.  You have chest pain or shortness of breath.  You vomit and your vomit looks like blood or coffee grounds.  You faint.  Your stool is bloody or black.  You cannot swallow, drink, or eat.   This information is not intended to replace advice given to you by your health care provider. Make sure you discuss any questions you have with your health care provider.   Document Released: 03/01/2005 Document Revised: 02/10/2015 Document Reviewed: 09/16/2014 Elsevier Interactive Patient Education [employer].

## 2015-12-10 NOTE — ED Provider Notes (Signed)
CSN: [id number]     Arrival date & time 12/09/15  2219 History   By signing my name below, I, Evelene Croon, attest that this documentation has been prepared under the direction and in the presence of Merryl Hacker, MD . Electronically Signed: Evelene Croon, Scribe. 12/10/2015. 2:31 AM.   Chief Complaint  Patient presents with  . Gastroesophageal Reflux  . Dizziness    The history is provided by the patient. No language interpreter was used.     HPI Comments:  William Gregory is a 30 y.o. male who presents to the Emergency Department complaining of dizziness with an associated HA  which began yesterday (12/09/15) afternoon. He describes a room spinning sensation and states he feels off balance. He notes he spent a large portion of the day outside in the heat but states he has stayed hydrated.  He also complains of "acid reflux" which began after eating a sandwich with tomatoes. Pt denies formal GERD diagnosis but states he took an antacid with relief. He denies fever, numbness, weakness, SOB and CP.  Past Medical History  Diagnosis Date  . Knee joint pain, left   . Psychiatric problem     unspecified by patient (receives "injections" bi-weekly)  . Asthma   . Depression   . Tobacco abuse   . Alcohol abuse   . Back pain    History reviewed. No pertinent past surgical history. Family History  Problem Relation Age of Onset  . Diabetes Mother   . Diabetes Father    Social History  Substance Use Topics  . Smoking status: Current Every Day Smoker -- 0.00 packs/day    Types: Cigarettes  . Smokeless tobacco: Never Used  . Alcohol Use: Yes    Review of Systems  Constitutional: Negative for fever and chills.  Respiratory: Negative for shortness of breath.   Cardiovascular: Negative for chest pain.  Neurological: Positive for dizziness and headaches. Negative for weakness and numbness.  All other systems reviewed and are negative.  Allergies  Ibuprofen  Home Medications    Prior to Admission medications   Medication Sig Start Date End Date Taking? Authorizing Provider  acetaminophen (TYLENOL) 325 MG tablet Take 2 tablets (650 mg total) by mouth every 6 (six) hours as needed for mild pain (or Fever >/= 101). Patient not taking: Reported on 08/20/2015 08/14/15   Velvet Bathe, MD  acyclovir (ZOVIRAX) 400 MG tablet Take 1 tablet (400 mg total) by mouth 3 (three) times daily. 11/02/15   Etta Quill, NP  cyclobenzaprine (FLEXERIL) 10 MG tablet Take 1 tablet (10 mg total) by mouth 2 (two) times daily as needed for muscle spasms. 08/20/15   Gareth Morgan, MD  dicyclomine (BENTYL) 20 MG tablet Take 1 tablet (20 mg total) by mouth 2 (two) times daily. 08/20/15   Gareth Morgan, MD  omeprazole (PRILOSEC) 20 MG capsule Take 1 capsule (20 mg total) by mouth daily. 12/10/15   Merryl Hacker, MD  ondansetron (ZOFRAN ODT) 4 MG disintegrating tablet Take 1 tablet (4 mg total) by mouth every 8 (eight) hours as needed for nausea or vomiting. 08/20/15   Gareth Morgan, MD   BP 108/62 mmHg  Pulse 58  Temp(Src) 97.9 F (36.6 C) (Oral)  Resp 19  Ht 5\' 9"  (1.753 m)  Wt 228 lb (103.42 kg)  BMI 33.65 kg/m2  SpO2 97% Physical Exam  Constitutional: He is oriented to person, place, and time. He appears well-developed and well-nourished. No distress.  HENT:  Head: Normocephalic  and atraumatic.  Mouth/Throat: Oropharynx is clear and moist.  Eyes: Pupils are equal, round, and reactive to light.  Neck: Normal range of motion. Neck supple.  Cardiovascular: Normal rate, regular rhythm and normal heart sounds.   No murmur heard. Pulmonary/Chest: Effort normal and breath sounds normal. No respiratory distress. He has no wheezes.  Abdominal: Soft. Bowel sounds are normal. There is no tenderness. There is no rebound.  Musculoskeletal: He exhibits no edema.  Neurological: He is alert and oriented to person, place, and time.  Cranial nerves II through XII intact, no dysmetria to  finger-nose-finger, normal gait, 5 out of 5 strength in all 4 extremities  Skin: Skin is warm and dry.  Psychiatric: He has a normal mood and affect.  Nursing note and vitals reviewed.   ED Course  Procedures   DIAGNOSTIC STUDIES:  Oxygen Saturation is 98% on RA, normal by my interpretation.    COORDINATION OF CARE:  2:25 AM Discussed treatment plan with pt at bedside and pt agreed to plan.  Labs Review Labs Reviewed  CBC WITH DIFFERENTIAL/PLATELET - Abnormal; Notable for the following:    RBC 3.99 (*)    Hemoglobin 12.3 (*)    HCT 36.1 (*)    All other components within normal limits  BASIC METABOLIC PANEL - Abnormal; Notable for the following:    Glucose, Bld 111 (*)    Creatinine, Ser 2.67 (*)    GFR calc non Af Amer 30 (*)    GFR calc Af Amer 35 (*)    All other components within normal limits  URINALYSIS, ROUTINE W REFLEX MICROSCOPIC (NOT AT [facility]) - Abnormal; Notable for the following:    Protein, ur 100 (*)    All other components within normal limits  URINE MICROSCOPIC-ADD ON - Abnormal; Notable for the following:    Squamous Epithelial / LPF 0-5 (*)    Bacteria, UA RARE (*)    All other components within normal limits    Imaging Review No results found. I have personally reviewed and evaluated these images and lab results as part of my medical decision-making.   EKG Interpretation   Date/Time:  Friday December 10 2015 02:49:15 EDT Ventricular Rate:  51 PR Interval:    QRS Duration: 95 QT Interval:  437 QTC Calculation: 403 R Axis:   97 Text Interpretation:  Sinus rhythm Borderline right axis deviation ST  elev, probable normal early repol pattern Confirmed by HORTON  MD,  [place] ([id number]) on 12/10/2015 3:52:08 AM      MDM   Final diagnoses:  Gastroesophageal reflux disease, esophagitis presence not specified  Dehydration    Patient presents with complaints of reflux which has resolved with Tums as well as dizziness. He relates this to working in the  heat. He is nontoxic on exam. Vital signs are reassuring. He is nonfocal. No signs of sellar patellar dysfunction. Orthostatics were positive with an increase of heart rate greater than 20. He was given fluids. He was also given pain medication for his headache. Creatinine is at baseline. Otherwise lab work is reassuring. Patient reported improvement of symptoms and was able to ambulate at baseline without difficulty. Suspect dehydration and heat exhaustion as the cause of his headache and dizziness. He had no further reflux symptoms while in the ER. EKG was reassuring.  After history, exam, and medical workup I feel the patient has been appropriately medically screened and is safe for discharge home. Pertinent diagnoses were discussed with the patient. Patient was given return  precautions.  I personally performed the services described in this documentation, which was scribed in my presence. The recorded information has been reviewed and is accurate.    Merryl Hacker, MD 12/10/15 [id number]

## 2015-12-10 NOTE — ED Notes (Signed)
Pt ambulated in hallway with steady gait and no problems

## 2015-12-10 NOTE — ED Notes (Signed)
Pt comes up to the nurses station and tells [place] that he just woke up as he was sleeping. No distress noted.  Told pt that I had been looking for him.

## 2015-12-27 ENCOUNTER — Emergency Department (HOSPITAL_COMMUNITY)
Admission: EM | Admit: 2015-12-27 | Discharge: 2015-12-27 | Discharge status: Absent without leave | Payer: Medicaid Other

## 2015-12-27 NOTE — ED Notes (Signed)
Patient called in main ED waiting room area with no response 

## 2015-12-28 ENCOUNTER — Encounter (HOSPITAL_COMMUNITY): Payer: Self-pay

## 2015-12-28 ENCOUNTER — Emergency Department (HOSPITAL_COMMUNITY)
Admission: EM | Admit: 2015-12-28 | Discharge: 2015-12-29 | Disposition: A | Discharge status: Home / Self Care | Payer: Medicaid Other | Attending: Emergency Medicine | Admitting: Emergency Medicine

## 2015-12-28 DIAGNOSIS — R1084 Generalized abdominal pain: Secondary | ICD-10-CM | POA: Insufficient documentation

## 2015-12-28 DIAGNOSIS — I252 Old myocardial infarction: Secondary | ICD-10-CM | POA: Diagnosis not present

## 2015-12-28 DIAGNOSIS — J45909 Unspecified asthma, uncomplicated: Secondary | ICD-10-CM | POA: Insufficient documentation

## 2015-12-28 DIAGNOSIS — F17210 Nicotine dependence, cigarettes, uncomplicated: Secondary | ICD-10-CM | POA: Diagnosis not present

## 2015-12-28 DIAGNOSIS — R109 Unspecified abdominal pain: Secondary | ICD-10-CM | POA: Diagnosis present

## 2015-12-28 LAB — URINALYSIS, ROUTINE W REFLEX MICROSCOPIC
Bilirubin Urine: NEGATIVE
GLUCOSE, UA: NEGATIVE mg/dL
HGB URINE DIPSTICK: NEGATIVE
KETONES UR: NEGATIVE mg/dL
Leukocytes, UA: NEGATIVE
Nitrite: NEGATIVE
PH: 6 (ref 5.0–8.0)
PROTEIN: 100 mg/dL — AB
Specific Gravity, Urine: 1.016 (ref 1.005–1.030)

## 2015-12-28 LAB — URINE MICROSCOPIC-ADD ON: RBC / HPF: NONE SEEN RBC/hpf (ref 0–5)

## 2015-12-28 LAB — ETHANOL: Alcohol, Ethyl (B): <5 mg/dL (ref <5)

## 2015-12-28 LAB — COMPREHENSIVE METABOLIC PANEL
ALBUMIN: 3.5 g/dL (ref 3.5–5.0)
ALT: 11 U/L — AB (ref 17–63)
AST: 22 U/L (ref 15–41)
Alkaline Phosphatase: 67 U/L (ref 38–126)
Anion gap: 4 — ABNORMAL LOW (ref 5–15)
BUN: 15 mg/dL (ref 6–20)
CHLORIDE: 110 mmol/L (ref 101–111)
CO2: 25 mmol/L (ref 22–32)
CREATININE: 2.37 mg/dL — AB (ref 0.61–1.24)
Calcium: 9.1 mg/dL (ref 8.9–10.3)
GFR calc Af Amer: 40 mL/min — ABNORMAL LOW (ref >60)
GFR, EST NON AFRICAN AMERICAN: 35 mL/min — AB (ref >60)
GLUCOSE: 107 mg/dL — AB (ref 65–99)
Potassium: 3.8 mmol/L (ref 3.5–5.1)
SODIUM: 139 mmol/L (ref 135–145)
Total Bilirubin: 0.7 mg/dL (ref 0.3–1.2)
Total Protein: 6.6 g/dL (ref 6.5–8.1)

## 2015-12-28 LAB — CBC
HCT: 42.5 % (ref 39.0–52.0)
Hemoglobin: 14 g/dL (ref 13.0–17.0)
MCH: 30 pg (ref 26.0–34.0)
MCHC: 32.9 g/dL (ref 30.0–36.0)
MCV: 91 fL (ref 78.0–100.0)
PLATELETS: 227 10*3/uL (ref 150–400)
RBC: 4.67 MIL/uL (ref 4.22–5.81)
RDW: 13.3 % (ref 11.5–15.5)
WBC: 7.3 10*3/uL (ref 4.0–10.5)

## 2015-12-28 LAB — LIPASE, BLOOD: LIPASE: 33 U/L (ref 11–51)

## 2015-12-28 MED ORDER — SODIUM CHLORIDE 0.9 % IV BOLUS (SEPSIS)
1000.0000 mL | Freq: Once | INTRAVENOUS | Status: AC
  Administered 2015-12-28: 1000 mL via INTRAVENOUS

## 2015-12-28 MED ORDER — ONDANSETRON HCL 4 MG/2ML IJ SOLN
4.0000 mg | Freq: Once | INTRAMUSCULAR | Status: AC
  Administered 2015-12-28: 4 mg via INTRAVENOUS
  Filled 2015-12-28: qty 2

## 2015-12-28 MED ORDER — GI COCKTAIL ~~LOC~~
30.0000 mL | Freq: Once | ORAL | Status: AC
  Administered 2015-12-28: 30 mL via ORAL
  Filled 2015-12-28: qty 30

## 2015-12-28 NOTE — ED Triage Notes (Signed)
Pt reports taking a "vega shot" at [employer] 2 days and has been feeling "woozy." pt reports abd pain, nausea, and diarrhea. Pt reports abd pain has improved today.

## 2015-12-28 NOTE — ED Provider Notes (Signed)
[facility] DEPT Provider Note   CSN: [id number] Arrival date & time: 12/28/15  1845  First Provider Contact:  First MD Initiated Contact with Patient 12/28/15 2027        History   Chief Complaint Chief Complaint  Patient presents with  . Abdominal Pain  . Fatigue    HPI William Gregory is a 30 y.o. male.  Patient with a history of an unknown psychiatric disease, gastroesophageal reflux disease and prior alcohol abuse presents with abdominal pain. He states it started earlier today. He's had associated nausea vomiting and diarrhea. His emesis is nonbloody and nonbilious. Diarrhea is nonbloody. He denies any drug use. He denies any abdominal surgeries. He denies any fevers. No urinary symptoms.    Abdominal Pain   Associated symptoms include diarrhea, nausea and vomiting. Pertinent negatives include fever, frequency, hematuria, headaches and arthralgias.    Past Medical History:  Diagnosis Date  . Alcohol abuse   . Asthma   . Back pain   . Depression   . Knee joint pain, left   . Psychiatric problem    unspecified by patient (receives "injections" bi-weekly)  . Tobacco abuse     Patient Active Problem List   Diagnosis Date Noted  . AKI (acute kidney injury) ([place]) 08/14/2015  . Nausea vomiting and diarrhea 08/14/2015  . Abdominal pain 08/14/2015  . GERD (gastroesophageal reflux disease) 08/14/2015  . Acute kidney injury ([place])   . ACUTE KIDNEY FAILURE UNSPECIFIED 07/09/2009  . TOBACCO USER 06/07/2009  . KNEE PAIN, RIGHT, CHRONIC 01/15/2009  . Backache 01/15/2009  . LEG EDEMA, BILATERAL 12/15/2008  . Alcohol abuse 04/02/2008  . Depression 04/02/2008  . MYOCARDIAL INFARCTION, HX OF 04/02/2008  . Asthma 04/02/2008    History reviewed. No pertinent surgical history.  OB History    No data available       Home Medications    Prior to Admission medications   Medication Sig Start Date End Date Taking? Authorizing Provider  acetaminophen (TYLENOL)  325 MG tablet Take 2 tablets (650 mg total) by mouth every 6 (six) hours as needed for mild pain (or Fever >/= 101). 08/14/15  Yes Velvet Bathe, MD  citalopram (CELEXA) 10 MG tablet Take 10 mg by mouth daily.   Yes Historical Provider, MD  acyclovir (ZOVIRAX) 400 MG tablet Take 1 tablet (400 mg total) by mouth 3 (three) times daily. 11/02/15   Etta Quill, NP  cyclobenzaprine (FLEXERIL) 10 MG tablet Take 1 tablet (10 mg total) by mouth 2 (two) times daily as needed for muscle spasms. 08/20/15   Gareth Morgan, MD  dicyclomine (BENTYL) 20 MG tablet Take 1 tablet (20 mg total) by mouth 2 (two) times daily. 08/20/15   Gareth Morgan, MD  omeprazole (PRILOSEC) 20 MG capsule Take 1 capsule (20 mg total) by mouth daily. 12/10/15   Merryl Hacker, MD  ondansetron (ZOFRAN ODT) 4 MG disintegrating tablet Take 1 tablet (4 mg total) by mouth every 8 (eight) hours as needed for nausea or vomiting. 08/20/15   Gareth Morgan, MD    Family History Family History  Problem Relation Age of Onset  . Diabetes Mother   . Diabetes Father     Social History Social History  Substance Use Topics  . Smoking status: Current Every Day Smoker    Packs/day: 0.50    Types: Cigarettes  . Smokeless tobacco: Never Used  . Alcohol use Yes     Allergies   Ibuprofen   Review of Systems Review of  Systems  Constitutional: Negative for chills, diaphoresis, fatigue and fever.  HENT: Negative for congestion, rhinorrhea and sneezing.   Eyes: Negative.   Respiratory: Negative for cough, chest tightness and shortness of breath.   Cardiovascular: Negative for chest pain and leg swelling.  Gastrointestinal: Positive for abdominal pain, diarrhea, nausea and vomiting. Negative for blood in stool.  Genitourinary: Negative for difficulty urinating, flank pain, frequency and hematuria.  Musculoskeletal: Negative for arthralgias and back pain.  Skin: Negative for rash.  Neurological: Negative for dizziness, speech difficulty,  weakness, numbness and headaches.     Physical Exam Updated Vital Signs BP 130/86   Pulse 80   Temp 98.7 F (37.1 C) (Oral)   Resp 18   Ht 5\' 6"  (1.676 m)   Wt 208 lb (94.3 kg)   SpO2 99%   BMI 33.57 kg/m   Physical Exam  Constitutional: He is oriented to person, place, and time. He appears well-developed and well-nourished.  HENT:  Head: Normocephalic and atraumatic.  Eyes: Pupils are equal, round, and reactive to light.  Neck: Normal range of motion. Neck supple.  Cardiovascular: Normal rate, regular rhythm and normal heart sounds.   Pulmonary/Chest: Effort normal and breath sounds normal. No respiratory distress. He has no wheezes. He has no rales. He exhibits no tenderness.  Abdominal: Soft. Bowel sounds are normal. There is tenderness (Mild diffuse tenderness). There is no rebound and no guarding.  Musculoskeletal: Normal range of motion. He exhibits no edema.  Lymphadenopathy:    He has no cervical adenopathy.  Neurological: He is alert and oriented to person, place, and time.  Skin: Skin is warm and dry. No rash noted.  Psychiatric: He has a normal mood and affect.     ED Treatments / Results  Labs (all labs ordered are listed, but only abnormal results are displayed) Results for orders placed or performed during the hospital encounter of 12/28/15  Lipase, blood  Result Value Ref Range   Lipase 33 11 - 51 U/L  Comprehensive metabolic panel  Result Value Ref Range   Sodium 139 135 - 145 mmol/L   Potassium 3.8 3.5 - 5.1 mmol/L   Chloride 110 101 - 111 mmol/L   CO2 25 22 - 32 mmol/L   Glucose, Bld 107 (H) 65 - 99 mg/dL   BUN 15 6 - 20 mg/dL   Creatinine, Ser 2.37 (H) 0.61 - 1.24 mg/dL   Calcium 9.1 8.9 - 10.3 mg/dL   Total Protein 6.6 6.5 - 8.1 g/dL   Albumin 3.5 3.5 - 5.0 g/dL   AST 22 15 - 41 U/L   ALT 11 (L) 17 - 63 U/L   Alkaline Phosphatase 67 38 - 126 U/L   Total Bilirubin 0.7 0.3 - 1.2 mg/dL   GFR calc non Af Amer 35 (L) >60 mL/min   GFR calc Af  Amer 40 (L) >60 mL/min   Anion gap 4 (L) 5 - 15  CBC  Result Value Ref Range   WBC 7.3 4.0 - 10.5 K/uL   RBC 4.67 4.22 - 5.81 MIL/uL   Hemoglobin 14.0 13.0 - 17.0 g/dL   HCT 42.5 39.0 - 52.0 %   MCV 91.0 78.0 - 100.0 fL   MCH 30.0 26.0 - 34.0 pg   MCHC 32.9 30.0 - 36.0 g/dL   RDW 13.3 11.5 - 15.5 %   Platelets 227 150 - 400 K/uL  Urinalysis, Routine w reflex microscopic  Result Value Ref Range   Color, Urine YELLOW YELLOW  APPearance CLEAR CLEAR   Specific Gravity, Urine 1.016 1.005 - 1.030   pH 6.0 5.0 - 8.0   Glucose, UA NEGATIVE NEGATIVE mg/dL   Hgb urine dipstick NEGATIVE NEGATIVE   Bilirubin Urine NEGATIVE NEGATIVE   Ketones, ur NEGATIVE NEGATIVE mg/dL   Protein, ur 100 (A) NEGATIVE mg/dL   Nitrite NEGATIVE NEGATIVE   Leukocytes, UA NEGATIVE NEGATIVE  Ethanol  Result Value Ref Range   Alcohol, Ethyl (B) <5 <5 mg/dL  Urine microscopic-add on  Result Value Ref Range   Squamous Epithelial / LPF 0-5 (A) NONE SEEN   WBC, UA 0-5 0 - 5 WBC/hpf   RBC / HPF NONE SEEN 0 - 5 RBC/hpf   Bacteria, UA RARE (A) NONE SEEN   Casts HYALINE CASTS (A) NEGATIVE   No results found.   EKG  EKG Interpretation None       Radiology No results found.  Procedures Procedures (including critical care time)  Medications Ordered in ED Medications  ondansetron (ZOFRAN) injection 4 mg (4 mg Intravenous Given 12/28/15 2138)  sodium chloride 0.9 % bolus 1,000 mL (1,000 mLs Intravenous New Bag/Given 12/28/15 2132)  gi cocktail (Maalox,Lidocaine,Donnatal) (30 mLs Oral Given 12/28/15 2138)     Initial Impression / Assessment and Plan / ED Course  I have reviewed the triage vital signs and the nursing notes.  Pertinent labs & imaging results that were available during my care of the patient were reviewed by me and considered in my medical decision making (see chart for details).  Clinical Course    Patient presents with abdominal pain. He was given a GI cocktail along with Zofran and  saline. He's feeling better. A repeat abdominal exam is completely nontender. He's had no vomiting in the ED. He was discharged home in good condition. He was encouraged to follow-up with the PCP or return here as needed for any worsening symptoms. His creatinine is elevated but is similar to his prior values.  Final Clinical Impressions(s) / ED Diagnoses   Final diagnoses:  Generalized abdominal pain    New Prescriptions New Prescriptions   No medications on file     Malvin Johns, MD 12/28/15 2333

## 2016-01-03 ENCOUNTER — Encounter (HOSPITAL_COMMUNITY): Payer: Self-pay | Admitting: Emergency Medicine

## 2016-01-03 DIAGNOSIS — J45909 Unspecified asthma, uncomplicated: Secondary | ICD-10-CM | POA: Insufficient documentation

## 2016-01-03 DIAGNOSIS — F17210 Nicotine dependence, cigarettes, uncomplicated: Secondary | ICD-10-CM | POA: Insufficient documentation

## 2016-01-03 DIAGNOSIS — K5900 Constipation, unspecified: Secondary | ICD-10-CM | POA: Diagnosis not present

## 2016-01-03 DIAGNOSIS — R1084 Generalized abdominal pain: Secondary | ICD-10-CM | POA: Diagnosis present

## 2016-01-03 LAB — COMPREHENSIVE METABOLIC PANEL
ALBUMIN: 3.7 g/dL (ref 3.5–5.0)
ALT: 14 U/L — AB (ref 17–63)
AST: 23 U/L (ref 15–41)
Alkaline Phosphatase: 74 U/L (ref 38–126)
Anion gap: 8 (ref 5–15)
BUN: 19 mg/dL (ref 6–20)
CHLORIDE: 105 mmol/L (ref 101–111)
CO2: 27 mmol/L (ref 22–32)
Calcium: 9.3 mg/dL (ref 8.9–10.3)
Creatinine, Ser: 2.3 mg/dL — ABNORMAL HIGH (ref 0.61–1.24)
GFR calc Af Amer: 42 mL/min — ABNORMAL LOW (ref >60)
GFR calc non Af Amer: 36 mL/min — ABNORMAL LOW (ref >60)
GLUCOSE: 110 mg/dL — AB (ref 65–99)
POTASSIUM: 3.5 mmol/L (ref 3.5–5.1)
Sodium: 140 mmol/L (ref 135–145)
Total Bilirubin: 0.5 mg/dL (ref 0.3–1.2)
Total Protein: 6.6 g/dL (ref 6.5–8.1)

## 2016-01-03 LAB — URINE MICROSCOPIC-ADD ON

## 2016-01-03 LAB — CBC
HCT: 44.3 % (ref 39.0–52.0)
Hemoglobin: 14.6 g/dL (ref 13.0–17.0)
MCH: 30.1 pg (ref 26.0–34.0)
MCHC: 33 g/dL (ref 30.0–36.0)
MCV: 91.3 fL (ref 78.0–100.0)
Platelets: 226 10*3/uL (ref 150–400)
RBC: 4.85 MIL/uL (ref 4.22–5.81)
RDW: 13.5 % (ref 11.5–15.5)
WBC: 8.3 10*3/uL (ref 4.0–10.5)

## 2016-01-03 LAB — URINALYSIS, ROUTINE W REFLEX MICROSCOPIC
Bilirubin Urine: NEGATIVE
Glucose, UA: NEGATIVE mg/dL
Ketones, ur: NEGATIVE mg/dL
Leukocytes, UA: NEGATIVE
Nitrite: NEGATIVE
Protein, ur: >300 mg/dL — AB
Specific Gravity, Urine: 1.029 (ref 1.005–1.030)
pH: 5.5 (ref 5.0–8.0)

## 2016-01-03 LAB — LIPASE, BLOOD: LIPASE: 37 U/L (ref 11–51)

## 2016-01-03 NOTE — ED Triage Notes (Signed)
Pt. reports generalized abdominal pain with emesis after eating supper this evening , pt. suspects food poisoning , denies fever or diarrhea .

## 2016-01-04 ENCOUNTER — Emergency Department (HOSPITAL_COMMUNITY)
Admission: EM | Admit: 2016-01-04 | Discharge: 2016-01-04 | Disposition: A | Discharge status: Home / Self Care | Payer: Medicaid Other | Attending: Emergency Medicine | Admitting: Emergency Medicine

## 2016-01-04 DIAGNOSIS — K5900 Constipation, unspecified: Secondary | ICD-10-CM

## 2016-01-04 DIAGNOSIS — R112 Nausea with vomiting, unspecified: Secondary | ICD-10-CM

## 2016-01-04 MED ORDER — POLYETHYLENE GLYCOL 3350 17 G PO PACK
17.0000 g | PACK | Freq: Every day | ORAL | 0 refills | Status: DC
Start: 2016-01-04 — End: 2017-03-28

## 2016-01-04 MED ORDER — ACETAMINOPHEN 500 MG PO TABS
1000.0000 mg | ORAL_TABLET | Freq: Once | ORAL | Status: AC
  Administered 2016-01-04: 1000 mg via ORAL
  Filled 2016-01-04: qty 2

## 2016-01-04 MED ORDER — DOCUSATE SODIUM 100 MG PO CAPS
100.0000 mg | ORAL_CAPSULE | Freq: Once | ORAL | Status: AC
  Administered 2016-01-04: 100 mg via ORAL
  Filled 2016-01-04: qty 1

## 2016-01-04 MED ORDER — DOCUSATE SODIUM 100 MG PO CAPS: 100.0000 mg | ORAL_CAPSULE | Freq: Two times a day (BID) | ORAL | 0 refills | Status: DC

## 2016-01-04 MED ORDER — ONDANSETRON 4 MG PO TBDP
4.0000 mg | ORAL_TABLET | Freq: Once | ORAL | Status: AC
  Administered 2016-01-04: 4 mg via ORAL
  Filled 2016-01-04: qty 1

## 2016-01-04 MED ORDER — ONDANSETRON 4 MG PO TBDP: 4.0000 mg | ORAL_TABLET | Freq: Three times a day (TID) | ORAL | 0 refills | Status: DC | PRN

## 2016-01-04 NOTE — ED Provider Notes (Signed)
By signing my name below, I, Ephriam Jenkins, attest that this documentation has been prepared under the direction and in the presence of [place], DO. Electronically signed, Ephriam Jenkins, ED Scribe. 01/04/16. 3:10 AM.  TIME SEEN: 3:00 AM  CHIEF COMPLAINT: Abdominal Pain  HPI:  HPI Comments: William Gregory is a 30 y.o. male with history of chronic kidney disease who presents to the Emergency Department complaining of nausea and vomiting that started this evening. Pt also reports two episodes of nonbloody, nonbilious vomiting; last episode 6 hours ago. Pt reports first episode of vomiting after eating dinner this evening. Pt states that he ate shrimp and chicken for dinner which he believes could have caused his nausea and vomiting.  Pt also complains of constipation; last bowel movement 3 days ago. He is passing gas. Not having abdominal pain currently. No diarrhea. No fever. Complaining of mild diffuse headache. Pt has not taken any OTC medication at home to relive his symptoms. Pt denies Hx of abdominal surgeries. Pt denies fever.   ROS: See HPI Constitutional: no fever  Eyes: no drainage  ENT: no runny nose   Cardiovascular:  no chest pain  Resp: no SOB  GI: vomiting GU: no dysuria Integumentary: no rash  Allergy: no hives  Musculoskeletal: no leg swelling  Neurological: no slurred speech ROS otherwise negative  PAST MEDICAL HISTORY/PAST SURGICAL HISTORY:  Past Medical History:  Diagnosis Date  . Alcohol abuse   . Asthma   . Back pain   . Depression   . Knee joint pain, left   . Psychiatric problem    unspecified by patient (receives "injections" bi-weekly)  . Tobacco abuse     MEDICATIONS:  Prior to Admission medications   Medication Sig Start Date End Date Taking? Authorizing Provider  acetaminophen (TYLENOL) 325 MG tablet Take 2 tablets (650 mg total) by mouth every 6 (six) hours as needed for mild pain (or Fever >/= 101). 08/14/15   Velvet Bathe, MD  acyclovir  (ZOVIRAX) 400 MG tablet Take 1 tablet (400 mg total) by mouth 3 (three) times daily. 11/02/15   Etta Quill, NP  citalopram (CELEXA) 10 MG tablet Take 10 mg by mouth daily.    Historical Provider, MD  cyclobenzaprine (FLEXERIL) 10 MG tablet Take 1 tablet (10 mg total) by mouth 2 (two) times daily as needed for muscle spasms. 08/20/15   Gareth Morgan, MD  dicyclomine (BENTYL) 20 MG tablet Take 1 tablet (20 mg total) by mouth 2 (two) times daily. 08/20/15   Gareth Morgan, MD  omeprazole (PRILOSEC) 20 MG capsule Take 1 capsule (20 mg total) by mouth daily. 12/10/15   Merryl Hacker, MD  ondansetron (ZOFRAN ODT) 4 MG disintegrating tablet Take 1 tablet (4 mg total) by mouth every 8 (eight) hours as needed for nausea or vomiting. 08/20/15   Gareth Morgan, MD    ALLERGIES:  Allergies  Allergen Reactions  . Ibuprofen Rash    SOCIAL HISTORY:  Social History  Substance Use Topics  . Smoking status: Current Every Day Smoker    Packs/day: 0.50    Types: Cigarettes  . Smokeless tobacco: Never Used  . Alcohol use Yes    FAMILY HISTORY: Family History  Problem Relation Age of Onset  . Diabetes Mother   . Diabetes Father     EXAM: BP 122/75 (BP Location: Right Arm)   Pulse 67   Temp 98.8 F (37.1 C) (Oral)   Resp 22   SpO2 97%  CONSTITUTIONAL: Alert  and oriented and responds appropriately to questions. Well-appearing; well-nourished HEAD: Normocephalic EYES: Conjunctivae clear, PERRL ENT: normal nose; no rhinorrhea; moist mucous membranes NECK: Supple, no meningismus, no LAD  CARD: RRR; S1 and S2 appreciated; no murmurs, no clicks, no rubs, no gallops RESP: Normal chest excursion without splinting or tachypnea; breath sounds clear and equal bilaterally; no wheezes, no rhonchi, no rales, no hypoxia or respiratory distress, speaking full sentences ABD/GI: Normal bowel sounds; non-distended; soft, non-tender, no rebound, no guarding, no peritoneal signs BACK:  The back appears normal  and is non-tender to palpation, there is no CVA tenderness EXT: Normal ROM in all joints; non-tender to palpation; no edema; normal capillary refill; no cyanosis, no calf tenderness or swelling    SKIN: Normal color for age and race; warm; no rash NEURO: Moves all extremities equally, sensation to light touch intact diffusely, cranial nerves II through XII intact PSYCH: The patient's mood and manner are appropriate. Grooming and personal hygiene are appropriate.  MEDICAL DECISION MAKING: Patient here with nausea and vomiting that occurred after eating something that he thinks could have been bad. He has not vomited in over 6 hours. Abdominal exam benign. Reports constipation with no bowel movement in 3 days but does not have any signs or symptoms to suggest bowel obstruction. No history of abdominal surgery. Labs ordered in triage are unremarkable except for chronic kidney disease which appears to be stable. We'll treat symptomatically with Tylenol, Zofran. We'll have him use Colace, MiraLAX at home to help with bowel movements. I do not feel he needs further emergent workup. Tolerating by mouth without difficulty.  At this time, I do not feel there is any life-threatening condition present. I have reviewed and discussed all results (EKG, imaging, lab, urine as appropriate), exam findings with patient/family. I have reviewed nursing notes and appropriate previous records.  I feel the patient is safe to be discharged home without further emergent workup and can continue workup as an outpatient. Discussed usual and customary return precautions. Patient/family verbalize understanding and are comfortable with this plan.  Outpatient follow-up has been provided. All questions have been answered.   This chart was scribed in my presence and reviewed by me personally.    [place], DO 01/04/16 [phone]

## 2016-01-04 NOTE — ED Notes (Signed)
Called for Room, no answer.

## 2016-01-10 ENCOUNTER — Emergency Department (HOSPITAL_COMMUNITY): Payer: Medicaid Other

## 2016-01-10 ENCOUNTER — Emergency Department (HOSPITAL_COMMUNITY)
Admission: EM | Admit: 2016-01-10 | Discharge: 2016-01-11 | Disposition: A | Discharge status: Home / Self Care | Payer: Medicaid Other | Attending: Emergency Medicine | Admitting: Emergency Medicine

## 2016-01-10 ENCOUNTER — Encounter (HOSPITAL_COMMUNITY): Payer: Self-pay

## 2016-01-10 DIAGNOSIS — Y999 Unspecified external cause status: Secondary | ICD-10-CM | POA: Diagnosis not present

## 2016-01-10 DIAGNOSIS — Y939 Activity, unspecified: Secondary | ICD-10-CM | POA: Diagnosis not present

## 2016-01-10 DIAGNOSIS — R197 Diarrhea, unspecified: Secondary | ICD-10-CM | POA: Diagnosis not present

## 2016-01-10 DIAGNOSIS — W19XXXA Unspecified fall, initial encounter: Secondary | ICD-10-CM | POA: Insufficient documentation

## 2016-01-10 DIAGNOSIS — F17210 Nicotine dependence, cigarettes, uncomplicated: Secondary | ICD-10-CM | POA: Diagnosis not present

## 2016-01-10 DIAGNOSIS — M545 Low back pain, unspecified: Secondary | ICD-10-CM

## 2016-01-10 DIAGNOSIS — Y929 Unspecified place or not applicable: Secondary | ICD-10-CM | POA: Insufficient documentation

## 2016-01-10 DIAGNOSIS — J45909 Unspecified asthma, uncomplicated: Secondary | ICD-10-CM | POA: Insufficient documentation

## 2016-01-10 MED ORDER — ACETAMINOPHEN 325 MG PO TABS
650.0000 mg | ORAL_TABLET | Freq: Once | ORAL | Status: AC
  Administered 2016-01-11: 650 mg via ORAL
  Filled 2016-01-10: qty 2

## 2016-01-10 NOTE — ED Triage Notes (Signed)
PT reports he is having diarrhea. Pt reports he has had diarrhea 2 times in the past 24hrs.  Pt reports he fell 1 month ago and has back pain.

## 2016-01-10 NOTE — ED Triage Notes (Signed)
Pt complaining of diarrhea and back pain. Pt seen here 6 days ago for constipation. Pt denies any nausea/vomiting.

## 2016-01-10 NOTE — ED Provider Notes (Signed)
By signing my name below, I, Reola Mosher, attest that this documentation has been prepared under the direction and in the presence of Delos Haring, PA-C.  Electronically Signed: Reola Mosher, ED Scribe. 01/10/16. 10:52 PM.  TIME SEEN:  First MD Initiated Contact with Patient 01/10/16 2244    CHIEF COMPLAINT:  Chief Complaint  Patient presents with  . Back Pain  . Diarrhea   HPI Comments: William Gregory is a 30 y.o. male with a PMHx of EtOH abuse and sciatica, who presents to the Emergency Department complaining of gradual onset, intermittent, throbbing lower back pain onset ~1 month ago, worsening today PTA. Pt reports that he fell ~1 month ago, but is unsure if his back pain today is related. His pain is worsened with sitting and bending occasionally. He has taken Tylenol intermittently with mild relief of his symptoms, but it always returns. Pt has been seen for same in the ED over the past year, with dx including sciatica. 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 Denies weakness in his upper extremities or lower extremities or numbness.   Pt is also c/o 2 episodes of diarrhea over ~24 hours. Pt was seen in the ED ~6 days ago for Constipation x ~2 weeks. At that time he was rx'd Miralax and Colace, but did fill them take them secondary to financial issues and did not take them. Pt reports his first BM since his last visit was ~4 hours ago, and was mostly loose. States that he now feels relief since the movement. No PSHx to the abdomen. Denies nausea, vomiting, abdominal pain, dysuria, or any other associated symptoms.   ROS: See HPI Constitutional: no fever  Eyes: no drainage  ENT: no runny nose   Cardiovascular:  no chest pain  Resp: no SOB  GI: no vomiting GU: no dysuria Integumentary: no rash  Allergy: no hives  Musculoskeletal: no leg swelling  Neurological: no slurred speech ROS otherwise negative  PAST MEDICAL HISTORY/PAST  SURGICAL HISTORY:  Past Medical History:  Diagnosis Date  . Alcohol abuse   . Asthma   . Back pain   . Depression   . Knee joint pain, left   . Psychiatric problem    unspecified by patient (receives "injections" bi-weekly)  . Tobacco abuse     MEDICATIONS:  Prior to Admission medications   Medication Sig Start Date End Date Taking? Authorizing Provider  docusate sodium (COLACE) 100 MG capsule Take 1 capsule (100 mg total) by mouth every 12 (twelve) hours. Patient not taking: Reported on 01/10/2016 01/04/16   Delice Bison Ward, DO  omeprazole (PRILOSEC) 20 MG capsule Take 1 capsule (20 mg total) by mouth daily. Patient not taking: Reported on 01/10/2016 12/10/15   Merryl Hacker, MD  ondansetron (ZOFRAN ODT) 4 MG disintegrating tablet Take 1 tablet (4 mg total) by mouth every 8 (eight) hours as needed for nausea or vomiting. Patient not taking: Reported on 01/10/2016 01/04/16   Delice Bison Ward, DO  polyethylene glycol [facility]) packet Take 17 g by mouth daily. Patient not taking: Reported on 01/10/2016 01/04/16   Delice Bison Ward, DO   ALLERGIES:  Allergies  Allergen Reactions  . Ibuprofen Rash   SOCIAL HISTORY:  Social History  Substance Use Topics  . Smoking status: Current Every Day Smoker    Packs/day: 0.50    Types: Cigarettes  . Smokeless tobacco: Never Used  . Alcohol use Yes   FAMILY HISTORY: Family History  Problem Relation Age of Onset  . Diabetes Mother   . Diabetes Father     EXAM: BP 134/79   Pulse 71   Temp 98.9 F (37.2 C) (Oral)   Resp 18   Ht 5\' 6"  (1.676 m)   Wt 209 lb (94.8 kg)   SpO2 97%   BMI 33.73 kg/m  CONSTITUTIONAL: Alert and oriented and responds appropriately to questions. Well-appearing; well-nourished HEAD: Normocephalic EYES: Conjunctivae clear, PERRL ENT: normal nose; no rhinorrhea; moist mucous membranes NECK: Supple, no meningismus, no LAD  CARD: RRR; S1 and S2 appreciated; no murmurs, no clicks, no rubs, no gallops RESP: Normal chest  excursion without splinting or tachypnea; breath sounds clear and equal bilaterally; no wheezes, no rhonchi, no rales, no hypoxia or respiratory distress, speaking full sentences ABD/GI: Normal bowel sounds; non-distended; soft, non-tender, no rebound, no guarding, no peritoneal signs BACK:  The back appears normal and is tender to palpation of the right paraspinal lumbar region, there is no CVA tenderness; Full ROM of back EXT: Normal ROM in all joints; non-tender to palpation; no edema; normal capillary refill; no cyanosis, no calf tenderness or swelling    SKIN: Normal color for age and race; warm; no rash NEURO: Moves all extremities equally, sensation to light touch intact diffusely, cranial nerves II through XII intact; no lower extremity weakness or numbness PSYCH: The patient's mood and manner are appropriate. Grooming and personal hygiene are appropriate.  MEDICAL DECISION MAKING: Patient's blood work showed that he had elevated BUNs, this is likely due to the diarrhea and some mild dehydration. He's been given a liter fluid in the emergency department and pain medication- patient had been dehydrated and is now having diarrhea, without pain on abdomen, no bleeding. The diarrhea is not watery or cramping. He describes the stool as loose. He has had improvement in his abdomen distention. I do not feel that this is a bowel obstruction.  30 y.o.William Gregory's  with back pain.   No neurological deficits and normal neuro exam. No loss of bowel or bladder control. No concern for cauda equina at this time base on HPI and physical exam findings. No fever, night sweats, weight loss, h/o cancer, IVDU. The patient can walk with some discomfort.   Patient Plan 1. Medications: NSAIDs and/or muscle relaxer. Cont usual home medications unless otherwise directed. 2. Treatment: rest, drink plenty of fluids, gentle stretching as discussed, alternate ice and heat  3. Follow Up: Please followup with your  primary doctor for discussion of your diagnoses and further evaluation after today's visit; if you do not have a primary care doctor use the resource guide provided to find one  Advised to follow-up with the orthopedist if symptoms do not start to resolve in the next 2-3 days. If develop loss of bowel or urinary control return to the ED as soon as possible for further evaluation. To take the medications as prescribed as they can cause harm if not taken appropriately.   Vital signs are stable at discharge. Vitals:   01/11/16 0030 01/11/16 0100  BP: 114/67 117/60  Pulse: (!) 42 (!) 44  Resp:    Temp:      Patient/guardian has voiced understanding and agreed to follow-up with the PCP or specialist.   I personally performed the services described in this documentation, which was scribed in my presence. The recorded information has been reviewed and is accurate.     Delos Haring, PA-C 01/11/16 0151    Sherwood Gambler, MD  01/13/16 0134  

## 2016-01-10 NOTE — ED Triage Notes (Signed)
Pt had to leave the room in Fast trac because he was having diarrhea . Pt acuity increased to 3 and returned to waiting room.

## 2016-01-11 LAB — I-STAT CHEM 8, ED
BUN: 32 mg/dL — AB (ref 6–20)
CALCIUM ION: 1.18 mmol/L (ref 1.13–1.30)
CREATININE: 2.2 mg/dL — AB (ref 0.61–1.24)
Chloride: 104 mmol/L (ref 101–111)
Glucose, Bld: 90 mg/dL (ref 65–99)
HCT: 42 % (ref 39.0–52.0)
Hemoglobin: 14.3 g/dL (ref 13.0–17.0)
Potassium: 3.9 mmol/L (ref 3.5–5.1)
Sodium: 141 mmol/L (ref 135–145)
TCO2: 27 mmol/L (ref 0–100)

## 2016-01-11 MED ORDER — CYCLOBENZAPRINE HCL 10 MG PO TABS: 5.0000 mg | ORAL_TABLET | Freq: Two times a day (BID) | ORAL | 0 refills | Status: DC | PRN

## 2016-01-11 MED ORDER — SODIUM CHLORIDE 0.9 % IV BOLUS (SEPSIS)
1000.0000 mL | Freq: Once | INTRAVENOUS | Status: AC
  Administered 2016-01-11: 1000 mL via INTRAVENOUS

## 2016-01-11 NOTE — ED Notes (Signed)
Gave pt ginger ale to drink.

## 2016-03-20 ENCOUNTER — Encounter (HOSPITAL_COMMUNITY): Payer: Self-pay | Admitting: Emergency Medicine

## 2016-03-20 ENCOUNTER — Emergency Department (HOSPITAL_COMMUNITY)
Admission: EM | Admit: 2016-03-20 | Discharge: 2016-03-20 | Disposition: A | Discharge status: Home / Self Care | Payer: Medicaid Other | Attending: Emergency Medicine | Admitting: Emergency Medicine

## 2016-03-20 DIAGNOSIS — L247 Irritant contact dermatitis due to plants, except food: Secondary | ICD-10-CM

## 2016-03-20 DIAGNOSIS — L255 Unspecified contact dermatitis due to plants, except food: Secondary | ICD-10-CM | POA: Insufficient documentation

## 2016-03-20 DIAGNOSIS — I252 Old myocardial infarction: Secondary | ICD-10-CM | POA: Diagnosis not present

## 2016-03-20 DIAGNOSIS — F17210 Nicotine dependence, cigarettes, uncomplicated: Secondary | ICD-10-CM | POA: Insufficient documentation

## 2016-03-20 DIAGNOSIS — R21 Rash and other nonspecific skin eruption: Secondary | ICD-10-CM | POA: Diagnosis present

## 2016-03-20 DIAGNOSIS — J45909 Unspecified asthma, uncomplicated: Secondary | ICD-10-CM | POA: Diagnosis not present

## 2016-03-20 MED ORDER — DEXAMETHASONE SODIUM PHOSPHATE 10 MG/ML IJ SOLN
10.0000 mg | Freq: Once | INTRAMUSCULAR | Status: AC
  Administered 2016-03-20: 10 mg via INTRAMUSCULAR
  Filled 2016-03-20: qty 1

## 2016-03-20 MED ORDER — TETANUS-DIPHTH-ACELL PERTUSSIS 5-2.5-18.5 LF-MCG/0.5 IM SUSP
0.5000 mL | Freq: Once | INTRAMUSCULAR | Status: AC
  Administered 2016-03-20: 0.5 mL via INTRAMUSCULAR
  Filled 2016-03-20: qty 0.5

## 2016-03-20 MED ORDER — PREDNISONE 10 MG (21) PO TBPK: 10.0000 mg | ORAL_TABLET | Freq: Every day | ORAL | 0 refills | Status: DC

## 2016-03-20 MED ORDER — HYDROCORTISONE 1 % EX CREA: TOPICAL_CREAM | CUTANEOUS | 0 refills | Status: DC

## 2016-03-20 NOTE — ED Notes (Signed)
Declined W/C at D/C and was escorted to lobby by RN. 

## 2016-03-20 NOTE — ED Triage Notes (Signed)
Pt states he was outside doing yard work in [place]. Pt states his left forearm started itching and had a rash. Pt has scabs from where he has scratched arm.

## 2016-03-20 NOTE — ED Provider Notes (Signed)
[facility] DEPT Provider Note   CSN: [id number] Arrival date & time: 03/20/16  0915  By signing my name below, I, Emmanuella Mensah, attest that this documentation has been prepared under the direction and in the presence of [facility], PA-C. Electronically Signed: Judithann Sauger, ED Scribe. 03/20/16. 11:05 AM.   History   Chief Complaint Chief Complaint  Patient presents with  . Rash    HPI Comments: William Gregory is a 30 y.o. male who presents to the Emergency Department complaining of gradually worsening moderately itchy rash to his left forearm onset 2 days ago. He reports associated clear discharge from rash and pain to a small area of the rash, stating that a log scrapped the area while working. 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 No other alleviating factors noted. Pt has not tried any medications PTA. He has an allergy to Ibuprofen. Pt is unsure of his last tetanus vacine. He denies any fever, chills, sore throat, difficulty breathing, abdominal pain, nausea, vomiting, or any other symptoms.   Current PCP unknown and pt has not be able to fill any of his prescriptions due to financial constraints.   The history is provided by the patient. No language interpreter was used.    Past Medical History:  Diagnosis Date  . Alcohol abuse   . Asthma   . Back pain   . Depression   . Knee joint pain, left   . Psychiatric problem    unspecified by patient (receives "injections" bi-weekly)  . Tobacco abuse     Patient Active Problem List   Diagnosis Date Noted  . AKI (acute kidney injury) ([place]) 08/14/2015  . Nausea vomiting and diarrhea 08/14/2015  . Abdominal pain 08/14/2015  . GERD (gastroesophageal reflux disease) 08/14/2015  . Acute kidney  injury ([place])   . ACUTE KIDNEY FAILURE UNSPECIFIED 07/09/2009  . TOBACCO USER 06/07/2009  . KNEE PAIN, RIGHT, CHRONIC 01/15/2009  . Backache 01/15/2009  . LEG EDEMA, BILATERAL 12/15/2008  . Alcohol abuse 04/02/2008  . Depression 04/02/2008  . MYOCARDIAL INFARCTION, HX OF 04/02/2008  . Asthma 04/02/2008    History reviewed. No pertinent surgical history.  OB History    No data available       Home Medications    Prior to Admission medications   Medication Sig Start Date End Date Taking? Authorizing Provider  cyclobenzaprine (FLEXERIL) 10 MG tablet Take 0.5-1 tablets (5-10 mg total) by mouth 2 (two) times daily as needed. 01/11/16   Tiffany Carlota Raspberry, PA-C  docusate sodium (COLACE) 100 MG capsule Take 1 capsule (100 mg total) by mouth every 12 (twelve) hours. Patient not taking: Reported on 01/10/2016 01/04/16   Delice Bison Ward, DO  hydrocortisone cream 1 % Apply to affected area 2 times daily 03/20/16   Clayton Bibles, PA-C  omeprazole (PRILOSEC) 20 MG capsule Take 1 capsule (20 mg total) by mouth daily. Patient not taking: Reported on 01/10/2016 12/10/15   Merryl Hacker, MD  ondansetron (ZOFRAN ODT) 4 MG disintegrating tablet Take 1 tablet (4 mg total) by mouth every 8 (eight) hours as needed for nausea or vomiting. Patient not taking: Reported on 01/10/2016 01/04/16   Delice Bison Ward, DO  polyethylene glycol [facility]) packet Take 17 g by  mouth daily. Patient not taking: Reported on 01/10/2016 01/04/16   Kristen N Ward, DO  predniSONE (STERAPRED UNI-PAK 21 TAB) 10 MG (21) TBPK tablet Take 1 tablet (10 mg total) by mouth daily. Day 1: take 6 tabs.  Day 2: 5 tabs  Day 3: 4 tabs  Day 4: 3 tabs  Day 5: 2 tabs  Day 6: 1 tab 03/20/16   Clayton Bibles, PA-C    Family History Family History  Problem Relation Age of Onset  . Diabetes Mother   . Diabetes Father     Social History Social History  Substance Use Topics  . Smoking status: Current Every Day Smoker    Packs/day: 0.50    Types: Cigarettes  .  Smokeless tobacco: Never Used  . Alcohol use Yes     Allergies   Ibuprofen   Review of Systems Review of Systems  Constitutional: Negative for chills and fever.  HENT: Negative for sore throat.   Respiratory: Negative for shortness of breath.   Gastrointestinal: Negative for abdominal pain, nausea and vomiting.  Skin: Positive for rash.  All other systems reviewed and are negative.    Physical Exam Updated Vital Signs BP 125/58 (BP Location: Right Arm)   Pulse 64   Temp 97.9 F (36.6 C) (Oral)   Resp 20   Ht 5\' 6"  (1.676 m)   Wt 99.8 kg   SpO2 100%   BMI 35.51 kg/m   Physical Exam  Constitutional: He appears well-developed and well-nourished. No distress.  HENT:  Head: Normocephalic and atraumatic.  Neck: Neck supple.  Pulmonary/Chest: Effort normal.  Musculoskeletal:  Left forearm, volar aspect with diffuse patch of erythema. No tenderness. Small abraded area with mild tenderness. Slight clear discharge, no induration or fluctuance Full ROM of the fingers Distal pulses intact Sensation intact   Neurological: He is alert.  Skin: He is not diaphoretic.  Nursing note and vitals reviewed.    ED Treatments / Results  DIAGNOSTIC STUDIES: Oxygen Saturation is 96% on RA, adequate by my interpretation.    COORDINATION OF CARE: 11:00 AM- Pt advised of plan for treatment and pt agrees. Pt will receive Tdap and Decadron here.    Labs (all labs ordered are listed, but only abnormal results are displayed) Labs Reviewed - No data to display  EKG  EKG Interpretation None       Radiology No results found.  Procedures Procedures (including critical care time)  Medications Ordered in ED Medications  Tdap (BOOSTRIX) injection 0.5 mL (0.5 mLs Intramuscular Given 03/20/16 1129)  dexamethasone (DECADRON) injection 10 mg (10 mg Intramuscular Given 03/20/16 1129)     Initial Impression / Assessment and Plan / ED Course  Clayton Bibles, PA-C has reviewed the  triage vital signs and the nursing notes.  Pertinent labs & imaging results that were available during my care of the patient were reviewed by me and considered in my medical decision making (see chart for details).  Clinical Course    Afebrile, nontoxic patient with contact dermatitis on left forearm from poison oak.  Also with small abrasion.  Doubt superinfection.  Tdap updated,  Decadron given in ED. D/C home with prednisone, hydrocortisone cream.   Discussed result, findings, treatment, and follow up  with patient.  Pt given return precautions.  Pt verbalizes understanding and agrees with plan.       Final Clinical Impressions(s) / ED Diagnoses   Final diagnoses:  Irritant contact dermatitis due to plants, except food    New  Prescriptions Discharge Medication List as of 03/20/2016 11:20 AM    START taking these medications   Details  hydrocortisone cream 1 % Apply to affected area 2 times daily, Print    predniSONE (STERAPRED UNI-PAK 21 TAB) 10 MG (21) TBPK tablet Take 1 tablet (10 mg total) by mouth daily. Day 1: take 6 tabs.  Day 2: 5 tabs  Day 3: 4 tabs  Day 4: 3 tabs  Day 5: 2 tabs  Day 6: 1 tab, Starting Mon 03/20/2016, Print        I personally performed the services described in this documentation, which was scribed in my presence. The recorded information has been reviewed and is accurate.    Clayton Bibles, PA-C 03/20/16 [place], MD 03/20/16 2203

## 2016-03-20 NOTE — Discharge Instructions (Signed)
Read the information below.  Use the prescribed medication as directed.  Please discuss all new medications with your pharmacist.  You may return to the Emergency Department at any time for worsening condition or any new symptoms that concern you.   If you develop increased redness, swelling, pus draining from the wound, increased pain, or fevers greater than 100.4, return to the ER immediately for a recheck.

## 2016-05-05 ENCOUNTER — Emergency Department (HOSPITAL_COMMUNITY)
Admission: EM | Admit: 2016-05-05 | Discharge: 2016-05-05 | Disposition: A | Discharge status: Home / Self Care | Payer: Medicaid Other | Attending: Emergency Medicine | Admitting: Emergency Medicine

## 2016-05-05 ENCOUNTER — Encounter (HOSPITAL_COMMUNITY): Payer: Self-pay | Admitting: *Deleted

## 2016-05-05 DIAGNOSIS — J45909 Unspecified asthma, uncomplicated: Secondary | ICD-10-CM | POA: Insufficient documentation

## 2016-05-05 DIAGNOSIS — I252 Old myocardial infarction: Secondary | ICD-10-CM | POA: Insufficient documentation

## 2016-05-05 DIAGNOSIS — F17210 Nicotine dependence, cigarettes, uncomplicated: Secondary | ICD-10-CM | POA: Diagnosis not present

## 2016-05-05 DIAGNOSIS — Z79899 Other long term (current) drug therapy: Secondary | ICD-10-CM | POA: Insufficient documentation

## 2016-05-05 DIAGNOSIS — K219 Gastro-esophageal reflux disease without esophagitis: Secondary | ICD-10-CM | POA: Insufficient documentation

## 2016-05-05 LAB — CBC WITH DIFFERENTIAL/PLATELET
BASOS ABS: 0 10*3/uL (ref 0.0–0.1)
Basophils Relative: 0 %
EOS ABS: 0.1 10*3/uL (ref 0.0–0.7)
EOS PCT: 1 %
HCT: 43.1 % (ref 39.0–52.0)
Hemoglobin: 14.7 g/dL (ref 13.0–17.0)
LYMPHS PCT: 35 %
Lymphs Abs: 3.4 10*3/uL (ref 0.7–4.0)
MCH: 31 pg (ref 26.0–34.0)
MCHC: 34.1 g/dL (ref 30.0–36.0)
MCV: 90.9 fL (ref 78.0–100.0)
MONO ABS: 0.8 10*3/uL (ref 0.1–1.0)
Monocytes Relative: 9 %
Neutro Abs: 5.3 10*3/uL (ref 1.7–7.7)
Neutrophils Relative %: 55 %
PLATELETS: 263 10*3/uL (ref 150–400)
RBC: 4.74 MIL/uL (ref 4.22–5.81)
RDW: 14.1 % (ref 11.5–15.5)
WBC: 9.5 10*3/uL (ref 4.0–10.5)

## 2016-05-05 LAB — COMPREHENSIVE METABOLIC PANEL
ALT: 14 U/L — AB (ref 17–63)
AST: 25 U/L (ref 15–41)
Albumin: 3.1 g/dL — ABNORMAL LOW (ref 3.5–5.0)
Alkaline Phosphatase: 66 U/L (ref 38–126)
Anion gap: 11 (ref 5–15)
BUN: 29 mg/dL — ABNORMAL HIGH (ref 6–20)
CHLORIDE: 106 mmol/L (ref 101–111)
CO2: 23 mmol/L (ref 22–32)
Calcium: 8.8 mg/dL — ABNORMAL LOW (ref 8.9–10.3)
Creatinine, Ser: 2.63 mg/dL — ABNORMAL HIGH (ref 0.61–1.24)
GFR, EST AFRICAN AMERICAN: 35 mL/min — AB (ref >60)
GFR, EST NON AFRICAN AMERICAN: 30 mL/min — AB (ref >60)
Glucose, Bld: 90 mg/dL (ref 65–99)
POTASSIUM: 4.1 mmol/L (ref 3.5–5.1)
SODIUM: 140 mmol/L (ref 135–145)
Total Bilirubin: 0.5 mg/dL (ref 0.3–1.2)
Total Protein: 5.9 g/dL — ABNORMAL LOW (ref 6.5–8.1)

## 2016-05-05 LAB — TROPONIN I

## 2016-05-05 LAB — LIPASE, BLOOD: LIPASE: 37 U/L (ref 11–51)

## 2016-05-05 MED ORDER — OMEPRAZOLE 20 MG PO CPDR: 20.0000 mg | DELAYED_RELEASE_CAPSULE | Freq: Every day | ORAL | 0 refills | Status: DC

## 2016-05-05 MED ORDER — GI COCKTAIL ~~LOC~~
30.0000 mL | Freq: Once | ORAL | Status: AC
  Administered 2016-05-05: 30 mL via ORAL
  Filled 2016-05-05: qty 30

## 2016-05-05 NOTE — ED Notes (Addendum)
RN attempted to complete EKG, pt refused "I ain't got time for all that."  RN attempted to explain importance however he continued to refuse.  Reports no pain at this time.  RN informed PA of refusal

## 2016-05-05 NOTE — ED Provider Notes (Signed)
William Gregory Provider Note   CSN: [id number] Arrival date & time: 05/05/16  0104     History   Chief Complaint Chief Complaint  Patient presents with  . Gastroesophageal Reflux    HPI William Gregory is a 30 y.o. male.  Patient presents with complaint of "heartburn" with burning type pain in his chest. No SOB, cough, fever, nausea, vomiting. He ate sausage pizza prior to onset of symptoms yesterday. He continues to have an appetite. He does not take any medications for symptoms that are recurrent and familiar to him as acid reflux.   The history is provided by the patient. No language interpreter was used.  Gastroesophageal Reflux  Associated symptoms include chest pain (See HPI.). Pertinent negatives include no abdominal pain.    Past Medical History:  Diagnosis Date  . Alcohol abuse   . Asthma   . Back pain   . Depression   . Knee joint pain, left   . Psychiatric problem    unspecified by patient (receives "injections" bi-weekly)  . Tobacco abuse     Patient Active Problem List   Diagnosis Date Noted  . AKI (acute kidney injury) ([place]) 08/14/2015  . Nausea vomiting and diarrhea 08/14/2015  . Abdominal pain 08/14/2015  . GERD (gastroesophageal reflux disease) 08/14/2015  . Acute kidney injury ([place])   . ACUTE KIDNEY FAILURE UNSPECIFIED 07/09/2009  . TOBACCO USER 06/07/2009  . KNEE PAIN, RIGHT, CHRONIC 01/15/2009  . Backache 01/15/2009  . LEG EDEMA, BILATERAL 12/15/2008  . Alcohol abuse 04/02/2008  . Depression 04/02/2008  . MYOCARDIAL INFARCTION, HX OF 04/02/2008  . Asthma 04/02/2008    History reviewed. No pertinent surgical history.  OB History    No data available       Home Medications    Prior to Admission medications   Medication Sig Start Date End Date Taking? Authorizing Provider  cyclobenzaprine (FLEXERIL) 10 MG tablet Take 0.5-1 tablets (5-10 mg total) by mouth 2 (two) times daily as needed. 01/11/16   Tiffany Carlota Raspberry, PA-C    docusate sodium (COLACE) 100 MG capsule Take 1 capsule (100 mg total) by mouth every 12 (twelve) hours. Patient not taking: Reported on 01/10/2016 01/04/16   Delice Bison Ward, DO  hydrocortisone cream 1 % Apply to affected area 2 times daily 03/20/16   Clayton Bibles, PA-C  omeprazole (PRILOSEC) 20 MG capsule Take 1 capsule (20 mg total) by mouth daily. Patient not taking: Reported on 01/10/2016 12/10/15   Merryl Hacker, MD  ondansetron (ZOFRAN ODT) 4 MG disintegrating tablet Take 1 tablet (4 mg total) by mouth every 8 (eight) hours as needed for nausea or vomiting. Patient not taking: Reported on 01/10/2016 01/04/16   Delice Bison Ward, DO  polyethylene glycol [facility]) packet Take 17 g by mouth daily. Patient not taking: Reported on 01/10/2016 01/04/16   Kristen N Ward, DO  predniSONE (STERAPRED UNI-PAK 21 TAB) 10 MG (21) TBPK tablet Take 1 tablet (10 mg total) by mouth daily. Day 1: take 6 tabs.  Day 2: 5 tabs  Day 3: 4 tabs  Day 4: 3 tabs  Day 5: 2 tabs  Day 6: 1 tab 03/20/16   Clayton Bibles, PA-C    Family History Family History  Problem Relation Age of Onset  . Diabetes Mother   . Diabetes Father     Social History Social History  Substance Use Topics  . Smoking status: Current Every Day Smoker    Packs/day: 0.50    Types: Cigarettes  .  Smokeless tobacco: Never Used  . Alcohol use Yes     Allergies   Ibuprofen   Review of Systems Review of Systems  Constitutional: Negative for fever.  Respiratory: Negative.   Cardiovascular: Positive for chest pain (See HPI.).  Gastrointestinal: Negative.  Negative for abdominal pain, blood in stool, nausea and vomiting.  Musculoskeletal: Negative.   Skin: Negative.   Neurological: Negative.      Physical Exam Updated Vital Signs BP 126/65 (BP Location: Right Arm)   Pulse 68   Temp 98.6 F (37 C) (Oral)   Resp 18   SpO2 98%   Physical Exam  Constitutional: He appears well-developed and well-nourished.  HENT:  Head: Normocephalic.  Neck:  Normal range of motion. Neck supple.  Cardiovascular: Normal rate and regular rhythm.   Pulmonary/Chest: Effort normal and breath sounds normal.  Abdominal: Soft. Bowel sounds are normal. There is no tenderness. There is no rebound and no guarding.  Musculoskeletal: Normal range of motion.  Neurological: He is alert. No cranial nerve deficit.  Skin: Skin is warm and dry. No rash noted.  Psychiatric: He has a normal mood and affect.     ED Treatments / Results  Labs (all labs ordered are listed, but only abnormal results are displayed) Labs Reviewed  COMPREHENSIVE METABOLIC PANEL - Abnormal; Notable for the following:       Result Value   BUN 29 (*)    Creatinine, Ser 2.63 (*)    Calcium 8.8 (*)    Total Protein 5.9 (*)    Albumin 3.1 (*)    ALT 14 (*)    GFR calc non Af Amer 30 (*)    GFR calc Af Amer 35 (*)    All other components within normal limits  CBC WITH DIFFERENTIAL/PLATELET  LIPASE, BLOOD  TROPONIN I   Results for orders placed or performed during the hospital encounter of 05/05/16  CBC with Differential/Platelet  Result Value Ref Range   WBC 9.5 4.0 - 10.5 K/uL   RBC 4.74 4.22 - 5.81 MIL/uL   Hemoglobin 14.7 13.0 - 17.0 g/dL   HCT 43.1 39.0 - 52.0 %   MCV 90.9 78.0 - 100.0 fL   MCH 31.0 26.0 - 34.0 pg   MCHC 34.1 30.0 - 36.0 g/dL   RDW 14.1 11.5 - 15.5 %   Platelets 263 150 - 400 K/uL   Neutrophils Relative % 55 %   Neutro Abs 5.3 1.7 - 7.7 K/uL   Lymphocytes Relative 35 %   Lymphs Abs 3.4 0.7 - 4.0 K/uL   Monocytes Relative 9 %   Monocytes Absolute 0.8 0.1 - 1.0 K/uL   Eosinophils Relative 1 %   Eosinophils Absolute 0.1 0.0 - 0.7 K/uL   Basophils Relative 0 %   Basophils Absolute 0.0 0.0 - 0.1 K/uL  Comprehensive metabolic panel  Result Value Ref Range   Sodium 140 135 - 145 mmol/L   Potassium 4.1 3.5 - 5.1 mmol/L   Chloride 106 101 - 111 mmol/L   CO2 23 22 - 32 mmol/L   Glucose, Bld 90 65 - 99 mg/dL   BUN 29 (H) 6 - 20 mg/dL   Creatinine, Ser  2.63 (H) 0.61 - 1.24 mg/dL   Calcium 8.8 (L) 8.9 - 10.3 mg/dL   Total Protein 5.9 (L) 6.5 - 8.1 g/dL   Albumin 3.1 (L) 3.5 - 5.0 g/dL   AST 25 15 - 41 U/L   ALT 14 (L) 17 - 63 U/L  Alkaline Phosphatase 66 38 - 126 U/L   Total Bilirubin 0.5 0.3 - 1.2 mg/dL   GFR calc non Af Amer 30 (L) >60 mL/min   GFR calc Af Amer 35 (L) >60 mL/min   Anion gap 11 5 - 15  Lipase, blood  Result Value Ref Range   Lipase 37 11 - 51 U/L  Troponin I  Result Value Ref Range   Troponin I <0.03 <0.03 ng/mL    EKG  EKG Interpretation None       Radiology No results found.  Procedures Procedures (including critical care time)  Medications Ordered in ED Medications  gi cocktail (Maalox,Lidocaine,Donnatal) (30 mLs Oral Given 05/05/16 0607)     Initial Impression / Assessment and Plan / ED Course  I have reviewed the triage vital signs and the nursing notes.  Pertinent labs & imaging results that were available during my care of the patient were reviewed by me and considered in my medical decision making (see chart for details).  Clinical Course     Patient with a history of GERD, not on medications, presents with symptoms of "heartburn" since yesterday. He states the pain has improved since arrival. No SOB. Patient has history of MI. Negative troponin. He refuses EKG on multiple attempts.   GI cocktail with resolution of symptoms. He can be discharged home with PCP follow up outpatient. Will restart on Prilosec.  Final Clinical Impressions(s) / ED Diagnoses   Final diagnoses:  None   1. Chest pain 2. GERD  New Prescriptions New Prescriptions   No medications on file     Charlann Lange, Hershal Coria 05/05/16 0149    Ezequiel Essex, MD 05/05/16 [phone]

## 2016-05-05 NOTE — ED Triage Notes (Addendum)
Pt c/o heartburn since 1800 with epigastric pain. Pt eating chips on assessment. Has not taken anything. Pt denies chest pain or sob

## 2016-05-06 ENCOUNTER — Encounter (HOSPITAL_COMMUNITY): Payer: Self-pay | Admitting: *Deleted

## 2016-05-06 ENCOUNTER — Emergency Department (HOSPITAL_COMMUNITY)
Admission: EM | Admit: 2016-05-06 | Discharge: 2016-05-06 | Disposition: A | Discharge status: Home / Self Care | Payer: Medicaid Other | Attending: Emergency Medicine | Admitting: Emergency Medicine

## 2016-05-06 DIAGNOSIS — F17210 Nicotine dependence, cigarettes, uncomplicated: Secondary | ICD-10-CM | POA: Insufficient documentation

## 2016-05-06 DIAGNOSIS — M436 Torticollis: Secondary | ICD-10-CM | POA: Insufficient documentation

## 2016-05-06 DIAGNOSIS — I252 Old myocardial infarction: Secondary | ICD-10-CM | POA: Insufficient documentation

## 2016-05-06 DIAGNOSIS — R51 Headache: Secondary | ICD-10-CM | POA: Diagnosis present

## 2016-05-06 DIAGNOSIS — J45909 Unspecified asthma, uncomplicated: Secondary | ICD-10-CM | POA: Insufficient documentation

## 2016-05-06 MED ORDER — ACETAMINOPHEN 500 MG PO TABS
1000.0000 mg | ORAL_TABLET | Freq: Once | ORAL | Status: AC
  Administered 2016-05-06: 1000 mg via ORAL
  Filled 2016-05-06: qty 2

## 2016-05-06 MED ORDER — DIPHENHYDRAMINE HCL 25 MG PO CAPS
25.0000 mg | ORAL_CAPSULE | Freq: Once | ORAL | Status: AC
  Administered 2016-05-06: 25 mg via ORAL
  Filled 2016-05-06: qty 1

## 2016-05-06 NOTE — ED Notes (Signed)
Pt states he is having a migraine and pain in his neck bilaterally when he turns his neck. Pt states that the pain in his neck feels as though he pulled a muscle. Pt states this has been going on for a day now.
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
## 2016-05-06 NOTE — ED Triage Notes (Signed)
Pt sleeping in the chair while being traiged

## 2016-05-06 NOTE — ED Triage Notes (Signed)
The pt is c.o a headache  Since yesterday  No n v or d  He has not taken any med for the headache  Very poor hygiene

## 2016-05-08 ENCOUNTER — Encounter (HOSPITAL_COMMUNITY): Payer: Self-pay | Admitting: Emergency Medicine

## 2016-05-08 ENCOUNTER — Emergency Department (HOSPITAL_COMMUNITY)
Admission: EM | Admit: 2016-05-08 | Discharge: 2016-05-08 | Disposition: A | Discharge status: Home / Self Care | Payer: Medicaid Other | Attending: Emergency Medicine | Admitting: Emergency Medicine

## 2016-05-08 DIAGNOSIS — J45909 Unspecified asthma, uncomplicated: Secondary | ICD-10-CM | POA: Diagnosis not present

## 2016-05-08 DIAGNOSIS — I252 Old myocardial infarction: Secondary | ICD-10-CM | POA: Diagnosis not present

## 2016-05-08 DIAGNOSIS — F17210 Nicotine dependence, cigarettes, uncomplicated: Secondary | ICD-10-CM | POA: Diagnosis not present

## 2016-05-08 DIAGNOSIS — L739 Follicular disorder, unspecified: Secondary | ICD-10-CM | POA: Diagnosis not present

## 2016-05-08 DIAGNOSIS — R222 Localized swelling, mass and lump, trunk: Secondary | ICD-10-CM | POA: Diagnosis present

## 2016-05-08 MED ORDER — SULFAMETHOXAZOLE-TRIMETHOPRIM 800-160 MG PO TABS: 1.0000 | ORAL_TABLET | Freq: Two times a day (BID) | ORAL | 0 refills | Status: AC

## 2016-05-08 NOTE — ED Triage Notes (Signed)
Pt. reports abscess at left lower buttocks onset yesterday with no drainage.

## 2016-05-08 NOTE — ED Provider Notes (Signed)
[facility] DEPT Provider Note   CSN: [id number] Arrival date & time: 05/08/16  2043 By signing my name below, I, Georgette Shell, attest that this documentation has been prepared under the direction and in the presence of Caryl Ada, [place]. Electronically Signed: Georgette Shell, ED Scribe. 05/08/16. 10:58 PM.  History   Chief Complaint Chief Complaint  Patient presents with  . Abscess   HPI Comments: William Gregory is a 30 y.o. male with h/o alcohol abuse and depression who presents to the Emergency Department complaining of a moderate, gradually worsening area of pain and swelling to the left lower buttock onset one day ago. Pt states pain is exacerbated with palpation and direct pressure. He has not tried any OTC medications PTA. Pt has been seen in the ED on 12/1 and 12/2 for various different complaints. Denies fever, chills, drainage from the area.   The history is provided by the patient. No language interpreter was used.    Past Medical History:  Diagnosis Date  . Alcohol abuse   . Asthma   . Back pain   . Depression   . Knee joint pain, left   . Psychiatric problem    unspecified by patient (receives "injections" bi-weekly)  . Tobacco abuse     Patient Active Problem List   Diagnosis Date Noted  . AKI (acute kidney injury) ([place]) 08/14/2015  . Nausea vomiting and diarrhea 08/14/2015  . Abdominal pain 08/14/2015  . GERD (gastroesophageal reflux disease) 08/14/2015  . Acute kidney injury ([place])   . ACUTE KIDNEY FAILURE UNSPECIFIED 07/09/2009  . TOBACCO USER 06/07/2009  . KNEE PAIN, RIGHT, CHRONIC 01/15/2009  . Backache 01/15/2009  . LEG EDEMA, BILATERAL 12/15/2008  . Alcohol abuse 04/02/2008  . Depression 04/02/2008  . MYOCARDIAL INFARCTION, HX OF 04/02/2008  . Asthma 04/02/2008    History reviewed. No pertinent surgical history.  OB History    No data available       Home Medications    Prior to Admission medications   Medication Sig Start Date End Date  Taking? Authorizing Provider  cyclobenzaprine (FLEXERIL) 10 MG tablet Take 0.5-1 tablets (5-10 mg total) by mouth 2 (two) times daily as needed. Patient not taking: Reported on 05/06/2016 01/11/16   Delos Haring, PA-C  docusate sodium (COLACE) 100 MG capsule Take 1 capsule (100 mg total) by mouth every 12 (twelve) hours. Patient not taking: Reported on 05/06/2016 01/04/16   Delice Bison Ward, DO  hydrocortisone cream 1 % Apply to affected area 2 times daily Patient not taking: Reported on 05/06/2016 03/20/16   Clayton Bibles, PA-C  omeprazole (PRILOSEC) 20 MG capsule Take 1 capsule (20 mg total) by mouth daily. Patient not taking: Reported on 05/06/2016 05/05/16   Charlann Lange, PA-C  ondansetron (ZOFRAN ODT) 4 MG disintegrating tablet Take 1 tablet (4 mg total) by mouth every 8 (eight) hours as needed for nausea or vomiting. Patient not taking: Reported on 05/06/2016 01/04/16   Delice Bison Ward, DO  polyethylene glycol [facility]) packet Take 17 g by mouth daily. Patient not taking: Reported on 05/06/2016 01/04/16   Kristen N Ward, DO  predniSONE (STERAPRED UNI-PAK 21 TAB) 10 MG (21) TBPK tablet Take 1 tablet (10 mg total) by mouth daily. Day 1: take 6 tabs.  Day 2: 5 tabs  Day 3: 4 tabs  Day 4: 3 tabs  Day 5: 2 tabs  Day 6: 1 tab Patient not taking: Reported on 05/06/2016 03/20/16   Clayton Bibles, PA-C    Family History  Family History  Problem Relation Age of Onset  . Diabetes Mother   . Diabetes Father     Social History Social History  Substance Use Topics  . Smoking status: Current Every Day Smoker    Packs/day: 0.50    Types: Cigarettes  . Smokeless tobacco: Never Used  . Alcohol use Yes     Allergies   Ibuprofen   Review of Systems Review of Systems  Constitutional: Negative for chills and fever.  Skin: Positive for wound.  All other systems reviewed and are negative.    Physical Exam Updated Vital Signs BP 134/72 (BP Location: Left Arm)   Pulse 77   Temp 98.2 F (36.8 C) (Oral)    Resp 16   Ht 5\' 5"  (1.651 m)   Wt 218 lb (98.9 kg)   SpO2 99%   BMI 36.28 kg/m   Physical Exam  Constitutional: He appears well-developed and well-nourished.  HENT:  Head: Normocephalic.  Eyes: Conjunctivae are normal.  Cardiovascular: Normal rate.   Pulmonary/Chest: Effort normal. No respiratory distress.  Abdominal: He exhibits no distension.  Musculoskeletal: Normal range of motion.  Neurological: He is alert.  Skin: Skin is warm and dry.  Psychiatric: He has a normal mood and affect. His behavior is normal.  Nursing note and vitals reviewed.    ED Treatments / Results  DIAGNOSTIC STUDIES: Oxygen Saturation is 99% on RA, normal by my interpretation.    COORDINATION OF CARE: 10:57 PM Discussed treatment plan with pt at bedside which includes Rx of antibiotics and pt agreed to plan.  Labs (all labs ordered are listed, but only abnormal results are displayed) Labs Reviewed - No data to display  EKG  EKG Interpretation None       Radiology No results found.  Procedures Procedures (including critical care time)  Medications Ordered in ED Medications - No data to display   Initial Impression / Assessment and Plan / ED Course  I have reviewed the triage vital signs and the nursing notes.  Pertinent labs & imaging results that were available during my care of the patient were reviewed by me and considered in my medical decision making (see chart for details).  Clinical Course       Final Clinical Impressions(s) / ED Diagnoses   Final diagnoses:  Folliculitis    New Prescriptions New Prescriptions   SULFAMETHOXAZOLE-TRIMETHOPRIM (BACTRIM DS,SEPTRA DS) 800-160 MG TABLET    Take 1 tablet by mouth 2 (two) times daily.    I personally performed the services in this documentation, which was scribed in my presence.  The recorded information has been reviewed and considered.   Ronnald Collum.     Hollace Kinnier [place]field, PA-C 05/08/16 [place],  MD 05/09/16 [phone]

## 2016-05-09 ENCOUNTER — Telehealth: Payer: Self-pay | Admitting: General Practice

## 2016-05-09 NOTE — Telephone Encounter (Signed)
-----   Message from Laurena Slimmer, RN sent at 05/08/2016  9:47 PM EST -----  Robinette Haines,  This patient needs a follow up appt. He has had 8 ED visit in the past 6 months. Thanks [employer].   Lorin Picket RN BSN CNOR [facility] ED Case Manager Case Management Department [facility] [address], [place]. [place] Ph. [phone] Ph [phone] Fax: [phone] [email or web address]

## 2016-05-09 NOTE — Telephone Encounter (Signed)
Called patient per ED referral appointment, patient refused appointment at the moment. Stated he would call us back

## 2017-03-27 ENCOUNTER — Encounter (HOSPITAL_COMMUNITY): Payer: Self-pay | Admitting: Emergency Medicine

## 2017-03-27 DIAGNOSIS — M545 Low back pain: Secondary | ICD-10-CM | POA: Diagnosis not present

## 2017-03-27 DIAGNOSIS — I252 Old myocardial infarction: Secondary | ICD-10-CM | POA: Diagnosis not present

## 2017-03-27 DIAGNOSIS — F17210 Nicotine dependence, cigarettes, uncomplicated: Secondary | ICD-10-CM | POA: Insufficient documentation

## 2017-03-27 DIAGNOSIS — J45909 Unspecified asthma, uncomplicated: Secondary | ICD-10-CM | POA: Insufficient documentation

## 2017-03-27 NOTE — ED Triage Notes (Signed)
Patient arrives with complaint of lower back pain that is exacerbated by walking and laying down. States it started about 6 months ago when "making out with his girlfriend".

## 2017-03-28 ENCOUNTER — Emergency Department (HOSPITAL_COMMUNITY)
Admission: EM | Admit: 2017-03-28 | Discharge: 2017-03-28 | Disposition: A | Discharge status: Home / Self Care | Payer: Medicaid Other | Attending: Emergency Medicine | Admitting: Emergency Medicine

## 2017-03-28 DIAGNOSIS — M545 Low back pain, unspecified: Secondary | ICD-10-CM

## 2017-03-28 MED ORDER — TRAMADOL HCL 50 MG PO TABS: 50.0000 mg | ORAL_TABLET | Freq: Four times a day (QID) | ORAL | 0 refills | Status: DC | PRN

## 2017-03-28 MED ORDER — METHOCARBAMOL 500 MG PO TABS: 500.0000 mg | ORAL_TABLET | Freq: Two times a day (BID) | ORAL | 0 refills | Status: DC

## 2017-03-28 NOTE — ED Notes (Signed)
PT ambulated to the bed with a steady gait. Patient is A&Ox4.  No signs of distress noted.  Please see providers complete history and physical exam.

## 2017-03-28 NOTE — ED Notes (Signed)
Pt frequently pacing around lobby, going outside frequently, stopping others asking to borrow their phone

## 2017-03-28 NOTE — ED Notes (Signed)
Pt verbalized understanding of d/c instructions and prescriptions. VSS. Pt ambulatory to lobby with steady gait. Given [place] sandwich and sprite.

## 2017-03-28 NOTE — ED Notes (Signed)
Pt c/o being in the hallway states that he would like a room with a phone. Pt notified that this way to be seen faster. Pt also given two sprites.

## 2017-03-28 NOTE — ED Notes (Signed)
Pt approaching nurse first stating "man, wasn't they supposed to ask me what my pain was? And wasn't they supposed to give me a pill or something? They didn't do none of that back there! They using up my medicaid!" Prior nurse marked patients pain as "0/10 asleep"; pt given Rx when discharged; pt still in department using public telephone in vending area

## 2017-03-28 NOTE — ED Provider Notes (Signed)
[facility] EMERGENCY DEPARTMENT Provider Note   CSN: [id number] Arrival date & time: 03/27/17  2051     History   Chief Complaint Chief Complaint  Patient presents with  . Back Pain    HPI William Gregory is a 31 y.o. male.  She presents to the emergency department for evaluation of back pain.  Patient reports that he has been experiencing low back pain for 6 months.  He does perform manual labor.  He reports that he noticed increased pain today after loading some lumbar into a dumpster.  Pain does not radiate to the legs.  No change in bowel or bladder function.  No weakness of the lower extremities.  He denies any direct injury.      Past Medical History:  Diagnosis Date  . Alcohol abuse   . Asthma   . Back pain   . Depression   . Knee joint pain, left   . Psychiatric problem    unspecified by patient (receives "injections" bi-weekly)  . Tobacco abuse     Patient Active Problem List   Diagnosis Date Noted  . AKI (acute kidney injury) ([place]) 08/14/2015  . Nausea vomiting and diarrhea 08/14/2015  . Abdominal pain 08/14/2015  . GERD (gastroesophageal reflux disease) 08/14/2015  . Acute kidney injury ([place])   . ACUTE KIDNEY FAILURE UNSPECIFIED 07/09/2009  . TOBACCO USER 06/07/2009  . KNEE PAIN, RIGHT, CHRONIC 01/15/2009  . Backache 01/15/2009  . LEG EDEMA, BILATERAL 12/15/2008  . Alcohol abuse 04/02/2008  . Depression 04/02/2008  . MYOCARDIAL INFARCTION, HX OF 04/02/2008  . Asthma 04/02/2008    History reviewed. No pertinent surgical history.     Home Medications    Prior to Admission medications   Medication Sig Start Date End Date Taking? Authorizing Provider  methocarbamol (ROBAXIN) 500 MG tablet Take 1 tablet (500 mg total) by mouth 2 (two) times daily. 03/28/17   Orpah Greek, MD  traMADol (ULTRAM) 50 MG tablet Take 1 tablet (50 mg total) by mouth every 6 (six) hours as needed. 03/28/17   Orpah Greek, MD     Family History Family History  Problem Relation Age of Onset  . Diabetes Mother   . Diabetes Father     Social History Social History  Substance Use Topics  . Smoking status: Current Every Day Smoker    Packs/day: 0.50    Types: Cigarettes  . Smokeless tobacco: Never Used  . Alcohol use Yes     Allergies   Ibuprofen   Review of Systems Review of Systems  Musculoskeletal: Positive for back pain.  All other systems reviewed and are negative.    Physical Exam Updated Vital Signs BP 111/76 (BP Location: Right Arm)   Pulse 86   Temp 97.9 F (36.6 C) (Oral)   Resp 18   Ht 5\' 5"  (1.651 m)   Wt 93 kg (205 lb)   SpO2 100%   BMI 34.11 kg/m   Physical Exam  Constitutional: He is oriented to person, place, and time. He appears well-developed and well-nourished. No distress.  HENT:  Head: Normocephalic and atraumatic.  Right Ear: Hearing normal.  Left Ear: Hearing normal.  Nose: Nose normal.  Mouth/Throat: Oropharynx is clear and moist and mucous membranes are normal.  Eyes: Pupils are equal, round, and reactive to light. Conjunctivae and EOM are normal.  Neck: Normal range of motion. Neck supple.  Cardiovascular: Regular rhythm, S1 normal and S2 normal.  Exam reveals no gallop and  no friction rub.   No murmur heard. Pulmonary/Chest: Effort normal and breath sounds normal. No respiratory distress. He exhibits no tenderness.  Abdominal: Soft. Normal appearance and bowel sounds are normal. There is no hepatosplenomegaly. There is no tenderness. There is no rebound, no guarding, no tenderness at McBurney's point and negative Murphy's sign. No hernia.  Musculoskeletal: Normal range of motion.       Lumbar back: He exhibits tenderness. He exhibits no bony tenderness.       Back:  Neurological: He is alert and oriented to person, place, and time. He has normal strength. No cranial nerve deficit or sensory deficit. Coordination normal. GCS eye subscore is 4. GCS verbal  subscore is 5. GCS motor subscore is 6.  Skin: Skin is warm, dry and intact. No rash noted. No cyanosis.  Psychiatric: He has a normal mood and affect. His speech is normal and behavior is normal. Thought content normal.  Nursing note and vitals reviewed.    ED Treatments / Results  Labs (all labs ordered are listed, but only abnormal results are displayed) Labs Reviewed - No data to display  EKG  EKG Interpretation None       Radiology No results found.  Procedures Procedures (including critical care time)  Medications Ordered in ED Medications - No data to display   Initial Impression / Assessment and Plan / ED Course  I have reviewed the triage vital signs and the nursing notes.  Pertinent labs & imaging results that were available during my care of the patient were reviewed by me and considered in my medical decision making (see chart for details).     Patient presents to the ER with musculoskeletal back pain. Examination reveals back tenderness without any associated neurologic findings. Patient's strength, sensation and reflexes were normal. There is no evidence of saddle anesthesia. Patient does not have a foot drop. Patient has not experienced any change in bowel or bladder function. As such, patient did not require any imaging or further studies. Patient was treated with analgesia.  Final Clinical Impressions(s) / ED Diagnoses   Final diagnoses:  Acute bilateral low back pain without sciatica    New Prescriptions New Prescriptions   METHOCARBAMOL (ROBAXIN) 500 MG TABLET    Take 1 tablet (500 mg total) by mouth 2 (two) times daily.   TRAMADOL (ULTRAM) 50 MG TABLET    Take 1 tablet (50 mg total) by mouth every 6 (six) hours as needed.     Orpah Greek, MD 03/28/17 [phone]

## 2017-03-28 NOTE — ED Notes (Signed)
Pt given warm blanket.

## 2017-03-28 NOTE — ED Notes (Signed)
Pt with patient in pod B

## 2017-04-29 ENCOUNTER — Emergency Department (HOSPITAL_COMMUNITY): Payer: Medicaid Other

## 2017-04-29 ENCOUNTER — Encounter (HOSPITAL_COMMUNITY): Payer: Self-pay | Admitting: Emergency Medicine

## 2017-04-29 ENCOUNTER — Emergency Department (HOSPITAL_COMMUNITY)
Admission: EM | Admit: 2017-04-29 | Discharge: 2017-04-29 | Disposition: A | Discharge status: Home / Self Care | Payer: Medicaid Other | Attending: Emergency Medicine | Admitting: Emergency Medicine

## 2017-04-29 ENCOUNTER — Other Ambulatory Visit: Payer: Self-pay

## 2017-04-29 DIAGNOSIS — R51 Headache: Secondary | ICD-10-CM | POA: Diagnosis not present

## 2017-04-29 DIAGNOSIS — K029 Dental caries, unspecified: Secondary | ICD-10-CM | POA: Insufficient documentation

## 2017-04-29 DIAGNOSIS — Y9301 Activity, walking, marching and hiking: Secondary | ICD-10-CM | POA: Diagnosis not present

## 2017-04-29 DIAGNOSIS — S39012A Strain of muscle, fascia and tendon of lower back, initial encounter: Secondary | ICD-10-CM | POA: Diagnosis not present

## 2017-04-29 DIAGNOSIS — Z79899 Other long term (current) drug therapy: Secondary | ICD-10-CM | POA: Diagnosis not present

## 2017-04-29 DIAGNOSIS — Y9289 Other specified places as the place of occurrence of the external cause: Secondary | ICD-10-CM | POA: Diagnosis not present

## 2017-04-29 DIAGNOSIS — F17210 Nicotine dependence, cigarettes, uncomplicated: Secondary | ICD-10-CM | POA: Diagnosis not present

## 2017-04-29 DIAGNOSIS — S161XXA Strain of muscle, fascia and tendon at neck level, initial encounter: Secondary | ICD-10-CM | POA: Diagnosis not present

## 2017-04-29 DIAGNOSIS — Y999 Unspecified external cause status: Secondary | ICD-10-CM | POA: Diagnosis not present

## 2017-04-29 DIAGNOSIS — S199XXA Unspecified injury of neck, initial encounter: Secondary | ICD-10-CM | POA: Diagnosis present

## 2017-04-29 DIAGNOSIS — J45909 Unspecified asthma, uncomplicated: Secondary | ICD-10-CM | POA: Insufficient documentation

## 2017-04-29 MED ORDER — PENICILLIN V POTASSIUM 500 MG PO TABS: 500.0000 mg | ORAL_TABLET | Freq: Four times a day (QID) | ORAL | 0 refills | Status: AC

## 2017-04-29 MED ORDER — MORPHINE SULFATE (PF) 4 MG/ML IV SOLN
4.0000 mg | Freq: Once | INTRAVENOUS | Status: AC
  Administered 2017-04-29: 4 mg via INTRAVENOUS
  Filled 2017-04-29: qty 1

## 2017-04-29 MED ORDER — ONDANSETRON HCL 4 MG/2ML IJ SOLN
4.0000 mg | Freq: Once | INTRAMUSCULAR | Status: AC
  Administered 2017-04-29: 4 mg via INTRAVENOUS
  Filled 2017-04-29: qty 2
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
## 2017-04-29 NOTE — ED Notes (Signed)
Discussed with patient about keeping the neck brace on until dr clear him. Patient stated he was going to take it off. Patient was told that if he do he is responsible for what happens after he takes it off.
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
## 2017-05-07 ENCOUNTER — Other Ambulatory Visit: Payer: Self-pay

## 2017-05-07 ENCOUNTER — Emergency Department (HOSPITAL_COMMUNITY): Payer: Medicaid Other

## 2017-05-07 ENCOUNTER — Encounter (HOSPITAL_COMMUNITY): Payer: Self-pay | Admitting: Emergency Medicine

## 2017-05-07 ENCOUNTER — Emergency Department (HOSPITAL_COMMUNITY)
Admission: EM | Admit: 2017-05-07 | Discharge: 2017-05-08 | Disposition: A | Discharge status: Home / Self Care | Payer: Medicaid Other | Attending: Emergency Medicine | Admitting: Emergency Medicine

## 2017-05-07 DIAGNOSIS — J45909 Unspecified asthma, uncomplicated: Secondary | ICD-10-CM | POA: Insufficient documentation

## 2017-05-07 DIAGNOSIS — R51 Headache: Secondary | ICD-10-CM | POA: Diagnosis present

## 2017-05-07 DIAGNOSIS — F17210 Nicotine dependence, cigarettes, uncomplicated: Secondary | ICD-10-CM | POA: Insufficient documentation

## 2017-05-07 DIAGNOSIS — F1910 Other psychoactive substance abuse, uncomplicated: Secondary | ICD-10-CM | POA: Insufficient documentation

## 2017-05-07 LAB — COMPREHENSIVE METABOLIC PANEL
ALT: 13 U/L — ABNORMAL LOW (ref 17–63)
AST: 25 U/L (ref 15–41)
Albumin: 3.7 g/dL (ref 3.5–5.0)
Alkaline Phosphatase: 55 U/L (ref 38–126)
Anion gap: 8 (ref 5–15)
BILIRUBIN TOTAL: 0.9 mg/dL (ref 0.3–1.2)
BUN: 19 mg/dL (ref 6–20)
CHLORIDE: 106 mmol/L (ref 101–111)
CO2: 26 mmol/L (ref 22–32)
CREATININE: 2.95 mg/dL — AB (ref 0.61–1.24)
Calcium: 9.3 mg/dL (ref 8.9–10.3)
GFR, EST AFRICAN AMERICAN: 31 mL/min — AB (ref >60)
GFR, EST NON AFRICAN AMERICAN: 26 mL/min — AB (ref >60)
Glucose, Bld: 80 mg/dL (ref 65–99)
POTASSIUM: 3.4 mmol/L — AB (ref 3.5–5.1)
Sodium: 140 mmol/L (ref 135–145)
TOTAL PROTEIN: 6.7 g/dL (ref 6.5–8.1)

## 2017-05-07 LAB — RAPID URINE DRUG SCREEN, HOSP PERFORMED
Amphetamines: NOT DETECTED
BARBITURATES: NOT DETECTED
Benzodiazepines: NOT DETECTED
COCAINE: POSITIVE — AB
OPIATES: NOT DETECTED
Tetrahydrocannabinol: NOT DETECTED

## 2017-05-07 LAB — CBC WITH DIFFERENTIAL/PLATELET
BASOS PCT: 0 %
Basophils Absolute: 0 10*3/uL (ref 0.0–0.1)
EOS ABS: 0.1 10*3/uL (ref 0.0–0.7)
EOS PCT: 1 %
HCT: 41.6 % (ref 39.0–52.0)
HEMOGLOBIN: 14.4 g/dL (ref 13.0–17.0)
LYMPHS ABS: 3.1 10*3/uL (ref 0.7–4.0)
Lymphocytes Relative: 35 %
MCH: 31.3 pg (ref 26.0–34.0)
MCHC: 34.6 g/dL (ref 30.0–36.0)
MCV: 90.4 fL (ref 78.0–100.0)
MONOS PCT: 9 %
Monocytes Absolute: 0.8 10*3/uL (ref 0.1–1.0)
NEUTROS PCT: 55 %
Neutro Abs: 4.7 10*3/uL (ref 1.7–7.7)
PLATELETS: 247 10*3/uL (ref 150–400)
RBC: 4.6 MIL/uL (ref 4.22–5.81)
RDW: 13.6 % (ref 11.5–15.5)
WBC: 8.8 10*3/uL (ref 4.0–10.5)

## 2017-05-07 LAB — I-STAT TROPONIN, ED: TROPONIN I, POC: 0.02 ng/mL (ref 0.00–0.08)

## 2017-05-07 LAB — ETHANOL

## 2017-05-07 LAB — SALICYLATE LEVEL: Salicylate Lvl: <7 mg/dL (ref 2.8–30.0)

## 2017-05-07 LAB — ACETAMINOPHEN LEVEL

## 2017-05-07 MED ORDER — DIPHENHYDRAMINE HCL 50 MG/ML IJ SOLN
25.0000 mg | Freq: Once | INTRAMUSCULAR | Status: AC
  Administered 2017-05-07: 25 mg via INTRAVENOUS
  Filled 2017-05-07: qty 1

## 2017-05-07 MED ORDER — METOCLOPRAMIDE HCL 5 MG/ML IJ SOLN
10.0000 mg | Freq: Once | INTRAMUSCULAR | Status: AC
  Administered 2017-05-07: 10 mg via INTRAVENOUS
  Filled 2017-05-07 (×2): qty 2

## 2017-05-07 MED ORDER — SODIUM CHLORIDE 0.9 % IV BOLUS (SEPSIS)
1000.0000 mL | Freq: Once | INTRAVENOUS | Status: AC
  Administered 2017-05-07: 1000 mL via INTRAVENOUS

## 2017-05-07 NOTE — ED Notes (Signed)
Pt placed on monitor.  

## 2017-05-07 NOTE — ED Notes (Signed)
Pt requesting detox

## 2017-05-07 NOTE — ED Notes (Signed)
Pt remains on monitor. 
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
## 2017-05-07 NOTE — ED Notes (Signed)
Patient transported to CT 

## 2017-05-07 NOTE — ED Provider Notes (Signed)
[facility] DEPT Provider Note   CSN: [id number] Arrival date & time: 05/07/17  2107     History   Chief Complaint Chief Complaint  Patient presents with  . Chest Pain  . Headache  . Addiction Problem    HPI William Gregory is a 31 y.o. male.  HPI  31 year old male with a history of alcohol and drug abuse presents with acute headache after abusing cocaine, marijuana, and alcohol tonight.  He states about 3 or 4 hours prior to arrival he snorted cocaine.  Also ingested alcohol and marijuana.  About 30 or 40 minutes later developed a severe headache.  He is also having chest pain that started the same time.  Both the chest pain and headache are sharp and the headache is also dull.  Headache is worse than the chest pain and is severe.  No vomiting but has felt nauseated.  Some shortness of breath but no new back pain.  He does have chronic low back pain.  He feels diffusely weak but denies any focal weakness or numbness.  He is having photophobia.  He denies suicidal or homicidal thoughts.  He is requesting detox for his drug and alcohol abuse.  Past Medical History:  Diagnosis Date  . Alcohol abuse   . Asthma   . Back pain   . Depression   . Knee joint pain, left   . Psychiatric problem    unspecified by patient (receives "injections" bi-weekly)  . Tobacco abuse     Patient Active Problem List   Diagnosis Date Noted  . AKI (acute kidney injury) ([facility]) 08/14/2015  . Nausea vomiting and diarrhea 08/14/2015  . Abdominal pain 08/14/2015  . GERD (gastroesophageal reflux disease) 08/14/2015  . Acute kidney injury ([place])   . ACUTE KIDNEY FAILURE UNSPECIFIED 07/09/2009  . TOBACCO USER 06/07/2009  . KNEE PAIN, RIGHT, CHRONIC 01/15/2009  . Backache 01/15/2009  . LEG EDEMA, BILATERAL 12/15/2008  . Alcohol abuse 04/02/2008  . Depression 04/02/2008  . MYOCARDIAL INFARCTION, HX OF 04/02/2008  . Asthma 04/02/2008    History reviewed. No pertinent  surgical history.     Home Medications    Prior to Admission medications   Medication Sig Start Date End Date Taking? Authorizing Provider  methocarbamol (ROBAXIN) 500 MG tablet Take 1 tablet (500 mg total) by mouth 2 (two) times daily. Patient not taking: Reported on 05/07/2017 03/28/17   Orpah Greek, MD  traMADol (ULTRAM) 50 MG tablet Take 1 tablet (50 mg total) by mouth every 6 (six) hours as needed. Patient not taking: Reported on 05/07/2017 03/28/17   Orpah Greek, MD    Family History Family History  Problem Relation Age of Onset  . Diabetes Mother   . Diabetes Father     Social History Social History   Tobacco Use  . Smoking status: Current Every Day Smoker    Packs/day: 0.50    Types: Cigarettes  . Smokeless tobacco: Never Used  Substance Use Topics  . Alcohol use: Yes    Comment: Pt stated "I drink like 3 40's every day."  . Drug use: Yes    Types: Cocaine, Marijuana     Allergies   Ibuprofen   Review of Systems Review of Systems  Constitutional: Negative for fever.  Respiratory: Positive for shortness of breath.   Cardiovascular: Positive for chest pain.  Gastrointestinal: Negative for vomiting.  Musculoskeletal: Positive for back pain (chronic).  Neurological: Positive for weakness (diffuse) and headaches. Negative for  numbness.  Psychiatric/Behavioral: Negative for suicidal ideas.  All other systems reviewed and are negative.    Physical Exam Updated Vital Signs BP (!) 143/85 (BP Location: Left Arm)   Pulse (!) 46   Temp 97.9 F (36.6 C) (Oral)   Resp 17   Ht 5\' 5"  (1.651 m)   Wt 90.7 kg (200 lb)   SpO2 100%   BMI 33.28 kg/m   Physical Exam  Constitutional: He is oriented to person, place, and time. He appears well-developed and well-nourished.  Non-toxic appearance. He does not appear ill.  HENT:  Head: Normocephalic and atraumatic.  Right Ear: External ear normal.  Left Ear: External ear normal.  Nose: Nose  normal.  Eyes: EOM are normal. Right eye exhibits no discharge. Left eye exhibits no discharge.  Neck: Neck supple.  Cardiovascular: Normal rate, regular rhythm and normal heart sounds.  Pulses:      Radial pulses are 2+ on the right side, and 2+ on the left side.  Pulmonary/Chest: Effort normal and breath sounds normal. He has no decreased breath sounds. He has no wheezes.  Abdominal: Soft. There is no tenderness.  Musculoskeletal: He exhibits no edema.       Right lower leg: He exhibits no edema.       Left lower leg: He exhibits no edema.  Neurological: He is alert and oriented to person, place, and time.  CN 3-12 grossly intact. 5/5 strength in all 4 extremities. Grossly normal sensation. Normal finger to nose.   Skin: Skin is warm and dry.  Nursing note and vitals reviewed.    ED Treatments / Results  Labs (all labs ordered are listed, but only abnormal results are displayed) Labs Reviewed  COMPREHENSIVE METABOLIC PANEL - Abnormal; Notable for the following components:      Result Value   Potassium 3.4 (*)    Creatinine, Ser 2.95 (*)    ALT 13 (*)    GFR calc non Af Amer 26 (*)    GFR calc Af Amer 31 (*)    All other components within normal limits  ACETAMINOPHEN LEVEL - Abnormal; Notable for the following components:   Acetaminophen (Tylenol), Serum <10 (*)    All other components within normal limits  RAPID URINE DRUG SCREEN, HOSP PERFORMED - Abnormal; Notable for the following components:   Cocaine POSITIVE (*)    All other components within normal limits  ETHANOL  SALICYLATE LEVEL  CBC WITH DIFFERENTIAL/PLATELET  I-STAT TROPONIN, ED    EKG  EKG Interpretation  Date/Time:  Tuesday May 08 2017 00:16:04 EST Ventricular Rate:  73 PR Interval:    QRS Duration: 100 QT Interval:  423 QTC Calculation: 467 R Axis:   81 Text Interpretation:  Sinus rhythm Normal ECG Rate is faster Confirmed by Shanon Rosser [id number]) on 05/08/2017 12:27:12 AM        Radiology Dg Chest 2 View  Result Date: 05/07/2017 CLINICAL DATA:  31 year old male with headache and chest pain following drug use today. Smoker. EXAM: CHEST  2 VIEW COMPARISON:  04/29/2017 and earlier. FINDINGS: Lung volumes are within normal limits today. Normal cardiac size and mediastinal contours. Visualized tracheal air column is within normal limits. No pneumothorax, pulmonary edema, pleural effusion or confluent pulmonary opacity. Mild chronic increased interstitial markings appear stable since 2016. Negative visible bowel gas pattern. No acute osseous abnormality identified. IMPRESSION: No acute cardiopulmonary abnormality. Electronically Signed   By: Genevie Ann M.D.   On: 05/07/2017 22:05   Ct Head Wo  Contrast  Result Date: 05/07/2017 CLINICAL DATA:  Headache and chest pain x4 hours after cocaine and marijuana use. EXAM: CT HEAD WITHOUT CONTRAST TECHNIQUE: Contiguous axial images were obtained from the base of the skull through the vertex without intravenous contrast. COMPARISON:  04/29/2017 FINDINGS: Brain: No evidence of acute infarction, hemorrhage, hydrocephalus, extra-axial collection or mass lesion/mass effect. Vascular: No hyperdense vessel or unexpected calcification. Skull: Normal. Negative for fracture or focal lesion. Sinuses/Orbits: Mild mucosal thickening of the maxillary and ethmoid sinuses. Other: None IMPRESSION: No acute intracranial abnormality. Electronically Signed   By: Ashley Royalty M.D.   On: 05/07/2017 23:15    Procedures Procedures (including critical care time)  Medications Ordered in ED Medications  metoCLOPramide (REGLAN) injection 10 mg (10 mg Intravenous Given 05/07/17 2254)  diphenhydrAMINE (BENADRYL) injection 25 mg (25 mg Intravenous Given 05/07/17 2253)  sodium chloride 0.9 % bolus 1,000 mL (1,000 mLs Intravenous New Bag/Given 05/07/17 2335)     Initial Impression / Assessment and Plan / ED Course  I have reviewed the triage vital signs and the nursing  notes.  Pertinent labs & imaging results that were available during my care of the patient were reviewed by me and considered in my medical decision making (see chart for details).     Given acute headache after cocaine abuse, CT head obtained. This is within 6 hours of headache onset per patient. Thus low suspicion for missed SAH. Neuro exam benign. CP atypical, but given cocaine use, will get delta troponin. Renal disease mildly worse than baseline 1 yr ago, but no AKI. Requesting detox, but no psychosis or SI. Will refer to outpatient resources. Will need 2nd trop to r/o MI. Care to Dr. Florina Ou  Final Clinical Impressions(s) / ED Diagnoses   Final diagnoses:  None    ED Discharge Orders    None       Sherwood Gambler, MD 05/08/17 [phone]
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
## 2017-05-07 NOTE — ED Notes (Signed)
Attempted to draw blood specimens from SL but without blood return.  SL flushed without difficulty.

## 2017-05-08 LAB — I-STAT TROPONIN, ED: TROPONIN I, POC: 0.02 ng/mL (ref 0.00–0.08)

## 2017-05-08 NOTE — ED Notes (Signed)
Pt refused to change out of scrubs back in to personal clothing.  Pt provided 2 Sprites.

## 2017-05-11 ENCOUNTER — Other Ambulatory Visit: Payer: Self-pay

## 2017-05-11 ENCOUNTER — Emergency Department (HOSPITAL_COMMUNITY)
Admission: EM | Admit: 2017-05-11 | Discharge: 2017-05-11 | Disposition: A | Discharge status: Home / Self Care | Payer: Medicaid Other | Attending: Emergency Medicine | Admitting: Emergency Medicine

## 2017-05-11 ENCOUNTER — Encounter (HOSPITAL_COMMUNITY): Payer: Self-pay

## 2017-05-11 DIAGNOSIS — J45909 Unspecified asthma, uncomplicated: Secondary | ICD-10-CM | POA: Diagnosis not present

## 2017-05-11 DIAGNOSIS — I252 Old myocardial infarction: Secondary | ICD-10-CM | POA: Diagnosis not present

## 2017-05-11 DIAGNOSIS — F329 Major depressive disorder, single episode, unspecified: Secondary | ICD-10-CM | POA: Insufficient documentation

## 2017-05-11 DIAGNOSIS — Y998 Other external cause status: Secondary | ICD-10-CM | POA: Insufficient documentation

## 2017-05-11 DIAGNOSIS — B353 Tinea pedis: Secondary | ICD-10-CM

## 2017-05-11 DIAGNOSIS — F17210 Nicotine dependence, cigarettes, uncomplicated: Secondary | ICD-10-CM | POA: Diagnosis not present

## 2017-05-11 DIAGNOSIS — Y9389 Activity, other specified: Secondary | ICD-10-CM | POA: Diagnosis not present

## 2017-05-11 DIAGNOSIS — Y92009 Unspecified place in unspecified non-institutional (private) residence as the place of occurrence of the external cause: Secondary | ICD-10-CM | POA: Insufficient documentation

## 2017-05-11 DIAGNOSIS — X58XXXA Exposure to other specified factors, initial encounter: Secondary | ICD-10-CM | POA: Insufficient documentation

## 2017-05-11 DIAGNOSIS — S91114A Laceration without foreign body of right lesser toe(s) without damage to nail, initial encounter: Secondary | ICD-10-CM | POA: Diagnosis not present

## 2017-05-11 MED ORDER — MICONAZOLE NITRATE 2 % EX CREA
TOPICAL_CREAM | Freq: Once | CUTANEOUS | Status: AC
  Administered 2017-05-11: 1 via TOPICAL
  Filled 2017-05-11: qty 28

## 2017-05-11 NOTE — ED Notes (Signed)
Pt verbalized understanding of importance of following up with a PCP and was given the appropriate resources.

## 2017-05-11 NOTE — ED Notes (Signed)
Pt was called twice. Nowhere to be seen. Checked outside and stood at bathroom door and called name. No response.

## 2017-05-11 NOTE — ED Notes (Signed)
4th and 5th toes and in between scrubbed with wound cleanser. Anti-fungal cream applied.

## 2017-05-11 NOTE — ED Notes (Signed)
Bed: [facility] Expected date:  Expected time:  Means of arrival:  Comments: 

## 2017-05-11 NOTE — ED Notes (Signed)
Pt requesting a sandwich, Sprite, and bus pass.
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
## 2017-05-11 NOTE — Discharge Instructions (Addendum)
Since you have Medicaid, please call the number on your discharge paperwork to get tablets with her primary care provider to follow-up on the infection between your toes.  The glue that was used for the cut on her toe will fall off on its own within the next few days.  Keep please keep the toe clean and dry for the first 24 hours, then clean the area at least once daily with warm soap and water.   Please also clean between the fourth and fifth toes on your right foot at least once daily with warm soap and water.  Apply the antifungal cream between the toes 2 times daily for the next 4 weeks.  After you apply the cream, please keep a small piece of gauze between the toes.  Fungal infections can take a long time to heal so it is important that you do not stop using this medication because they can get worse.  If you develop any new or worsening symptoms, including fever, chills, or if the right foot or toes become red, hot, or swollen, please return to the emergency department for re-evaluation.
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
## 2017-05-13 ENCOUNTER — Encounter (HOSPITAL_COMMUNITY): Payer: Self-pay | Admitting: Emergency Medicine

## 2017-05-13 ENCOUNTER — Emergency Department (HOSPITAL_COMMUNITY)
Admission: EM | Admit: 2017-05-13 | Discharge: 2017-05-13 | Disposition: A | Discharge status: Home / Self Care | Payer: Medicaid Other | Attending: Emergency Medicine | Admitting: Emergency Medicine

## 2017-05-13 ENCOUNTER — Emergency Department (HOSPITAL_COMMUNITY): Payer: Medicaid Other

## 2017-05-13 DIAGNOSIS — I252 Old myocardial infarction: Secondary | ICD-10-CM | POA: Insufficient documentation

## 2017-05-13 DIAGNOSIS — F17210 Nicotine dependence, cigarettes, uncomplicated: Secondary | ICD-10-CM | POA: Diagnosis not present

## 2017-05-13 DIAGNOSIS — J45909 Unspecified asthma, uncomplicated: Secondary | ICD-10-CM | POA: Insufficient documentation

## 2017-05-13 DIAGNOSIS — F1210 Cannabis abuse, uncomplicated: Secondary | ICD-10-CM | POA: Diagnosis not present

## 2017-05-13 DIAGNOSIS — F1910 Other psychoactive substance abuse, uncomplicated: Secondary | ICD-10-CM

## 2017-05-13 DIAGNOSIS — Z79899 Other long term (current) drug therapy: Secondary | ICD-10-CM | POA: Diagnosis not present

## 2017-05-13 DIAGNOSIS — R079 Chest pain, unspecified: Secondary | ICD-10-CM | POA: Diagnosis present

## 2017-05-13 DIAGNOSIS — R0789 Other chest pain: Secondary | ICD-10-CM | POA: Insufficient documentation

## 2017-05-13 LAB — BASIC METABOLIC PANEL
ANION GAP: 9 (ref 5–15)
BUN: 24 mg/dL — ABNORMAL HIGH (ref 6–20)
CO2: 26 mmol/L (ref 22–32)
Calcium: 9.3 mg/dL (ref 8.9–10.3)
Chloride: 105 mmol/L (ref 101–111)
Creatinine, Ser: 2.79 mg/dL — ABNORMAL HIGH (ref 0.61–1.24)
GFR, EST AFRICAN AMERICAN: 33 mL/min — AB (ref >60)
GFR, EST NON AFRICAN AMERICAN: 28 mL/min — AB (ref >60)
GLUCOSE: 111 mg/dL — AB (ref 65–99)
POTASSIUM: 3.8 mmol/L (ref 3.5–5.1)
Sodium: 140 mmol/L (ref 135–145)

## 2017-05-13 LAB — CBC
HEMATOCRIT: 44.3 % (ref 39.0–52.0)
HEMOGLOBIN: 15 g/dL (ref 13.0–17.0)
MCH: 30.9 pg (ref 26.0–34.0)
MCHC: 33.9 g/dL (ref 30.0–36.0)
MCV: 91.3 fL (ref 78.0–100.0)
Platelets: 256 10*3/uL (ref 150–400)
RBC: 4.85 MIL/uL (ref 4.22–5.81)
RDW: 13.6 % (ref 11.5–15.5)
WBC: 7 10*3/uL (ref 4.0–10.5)

## 2017-05-13 LAB — I-STAT TROPONIN, ED: TROPONIN I, POC: 0 ng/mL (ref 0.00–0.08)

## 2017-05-13 MED ORDER — ONDANSETRON 4 MG PO TBDP
4.0000 mg | ORAL_TABLET | Freq: Once | ORAL | Status: AC
  Administered 2017-05-13: 4 mg via ORAL
  Filled 2017-05-13: qty 1

## 2017-05-13 MED ORDER — ACETAMINOPHEN 500 MG PO TABS: 1000.0000 mg | ORAL_TABLET | Freq: Once | ORAL | Status: DC

## 2017-05-13 MED ORDER — ACETAMINOPHEN 500 MG PO TABS
1000.0000 mg | ORAL_TABLET | Freq: Once | ORAL | Status: AC
  Administered 2017-05-13: 1000 mg via ORAL
  Filled 2017-05-13: qty 2
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
## 2017-05-13 NOTE — ED Provider Notes (Signed)
[facility] EMERGENCY DEPARTMENT Provider Note   CSN: [id number] Arrival date & time: 05/13/17  1444     History   Chief Complaint Chief Complaint  Patient presents with  . Chest Pain    HPI William Gregory is a 31 y.o. male.  The history is provided by the patient.  Chest Pain   This is a new problem. The current episode started 3 to 5 hours ago. The problem occurs constantly. The problem has not changed since onset.Associated with: marijuana use. The pain is present in the lateral region. The pain is moderate. The quality of the pain is described as sharp. Exacerbated by: nothing. Associated symptoms include dizziness, nausea and vomiting. Pertinent negatives include no abdominal pain, no cough and no shortness of breath. He has tried nothing for the symptoms. The treatment provided no relief. Risk factors include male gender, substance abuse and smoking/tobacco exposure.   31 year old male who presents with chest pain.  He has a history of polysubstance abuse.  Reports that he smoked marijuana that was laced with something that he was not sure of several hours ago.  States that he started having nausea, vomiting, dizziness, and sharp right-sided chest pain.  No aggravating or alleviating factors.  Has been in his usual state of health prior to this.  Past Medical History:  Diagnosis Date  . Alcohol abuse   . Asthma   . Back pain   . Depression   . Knee joint pain, left   . Psychiatric problem    unspecified by patient (receives "injections" bi-weekly)  . Tobacco abuse     Patient Active Problem List   Diagnosis Date Noted  . AKI (acute kidney injury) ([place]) 08/14/2015  . Nausea vomiting and diarrhea 08/14/2015  . Abdominal pain 08/14/2015  . GERD (gastroesophageal reflux disease) 08/14/2015  . Acute kidney injury ([place])   . ACUTE KIDNEY FAILURE UNSPECIFIED 07/09/2009  . TOBACCO USER 06/07/2009  . KNEE PAIN, RIGHT, CHRONIC 01/15/2009  . Backache  01/15/2009  . LEG EDEMA, BILATERAL 12/15/2008  . Alcohol abuse 04/02/2008  . Depression 04/02/2008  . MYOCARDIAL INFARCTION, HX OF 04/02/2008  . Asthma 04/02/2008    No past surgical history on file.     Home Medications    Prior to Admission medications   Medication Sig Start Date End Date Taking? Authorizing Provider  citalopram (CELEXA) 20 MG tablet Take 20 mg by mouth daily.   Yes [provider]    Family History Family History  Problem Relation Age of Onset  . Diabetes Mother   . Diabetes Father     Social History Social History   Tobacco Use  . Smoking status: Current Every Day Smoker    Packs/day: 1.00    Types: Cigarettes  . Smokeless tobacco: Never Used  Substance Use Topics  . Alcohol use: Yes    Comment: daily use. Patient states he drank 2 4ounce beers this morning  . Drug use: Yes    Types: Cocaine, Marijuana    Comment: last used 2-3 days ago.     Allergies   Aspirin and Ibuprofen   Review of Systems Review of Systems  Respiratory: Negative for cough and shortness of breath.   Cardiovascular: Positive for chest pain.  Gastrointestinal: Positive for nausea and vomiting. Negative for abdominal pain.  Neurological: Positive for dizziness.  All other systems reviewed and are negative.    Physical Exam Updated Vital Signs BP 119/80   Pulse 66   Temp  98.4 F (36.9 C) (Oral)   Resp (!) 23   SpO2 100%   Physical Exam Physical Exam  Nursing note and vitals reviewed. Constitutional: Well developed, well nourished, non-toxic, and in no acute distress Head: Normocephalic and atraumatic.  Mouth/Throat: Oropharynx is clear and moist.  Neck: Normal range of motion. Neck supple.  Cardiovascular: Normal rate and regular rhythm.   Pulmonary/Chest: Effort normal and breath sounds normal.  Abdominal: Soft. There is no tenderness. There is no rebound and no guarding.  Musculoskeletal: Normal range of motion.  Neurological: Alert, no  facial droop, fluent speech, moves all extremities symmetrically Skin: Skin is warm and dry.  Psychiatric: Cooperative   ED Treatments / Results  Labs (all labs ordered are listed, but only abnormal results are displayed) Labs Reviewed  BASIC METABOLIC PANEL - Abnormal; Notable for the following components:      Result Value   Glucose, Bld 111 (*)    BUN 24 (*)    Creatinine, Ser 2.79 (*)    GFR calc non Af Amer 28 (*)    GFR calc Af Amer 33 (*)    All other components within normal limits  CBC  I-STAT TROPONIN, ED    EKG  EKG Interpretation  Date/Time:  Sunday May 13 2017 14:51:36 EST Ventricular Rate:  66 PR Interval:    QRS Duration: 85 QT Interval:  402 QTC Calculation: 422 R Axis:   66 Text Interpretation:  Sinus rhythm Confirmed by James, Mark ([id number]) on 05/13/2017 3:10:21 PM Also confirmed by James, Mark ([id number])  on 05/13/2017 3:11:11 PM       Radiology Dg Chest 2 View  Result Date: 05/13/2017 CLINICAL DATA:  Sudden onset of non radiating chest pain after using marijuana laced with cocaine, headache, vomiting, history asthma and smoking EXAM: CHEST  2 VIEW COMPARISON:  None FINDINGS: Normal heart size, mediastinal contours, and pulmonary vascularity. Lungs clear. No pleural effusion or pneumothorax. Bones unremarkable. IMPRESSION: Normal exam. Electronically Signed   By: Mark  Boles M.D.   On: 05/13/2017 15:28    Procedures Procedures (including critical care time)  Medications Ordered in ED Medications  ondansetron (ZOFRAN-ODT) disintegrating tablet 4 mg (4 mg Oral Given 05/13/17 1543)  acetaminophen (TYLENOL) tablet 1,000 mg (1,000 mg Oral Given 05/13/17 1543)     Initial Impression / Assessment and Plan / ED Course  I have reviewed the triage vital signs and the nursing notes.  Pertinent labs & imaging results that were available during my care of the patient were reviewed by me and considered in my medical decision making (see chart for details).      31  year old male who presents with chest pain, nausea and vomiting, and dizziness after smoking marijuana laced with unknown substance.  In the ED, he is well-appearing and in no acute distress with normal vital signs.  During ED course most of his symptoms are resolved.  His blood work is overall reassuring aside from signs of chronic kidney disease with creatinine of 2.9.  This is close to his baseline based on previous blood work.  I have discussed that he will require primary care follow-up for this, and is provided resources.  His EKG is nonischemic, troponin is normal.  Chest x-ray visualized and shows no acute cardiopulmonary processes.  Given symptoms now resolved, he is tolerating p.o., and mentating really he will be discharged home with continued supportive care. Strict return and follow-up instructions reviewed. He expressed understanding of all discharge instructions and felt comfortable with the  plan of care.   Final Clinical Impressions(s) / ED Diagnoses   Final diagnoses:  Atypical chest pain  Polysubstance abuse [facility])    ED Discharge Orders    None       Forde Dandy, MD 05/13/17 [phone]

## 2017-05-13 NOTE — ED Notes (Signed)
Patient verbalized understanding of discharge instructions and denies any further needs or questions at this time. VS stable. Patient ambulatory with steady gait.  

## 2017-05-13 NOTE — Discharge Instructions (Signed)
Your blood work is overall reassuring. Your symptoms are related to the marijuana usage and will take time to fully get out of your system. Please avoid any substance use.  You have some kidney dysfunction that you will need to follow-up with a primary care doctor about. You are given some resources for follow-up.Please call for follow-up Please return for worsening symptoms, including escalating pain, passing out, intractable vomiting or any other symptoms concerning to you

## 2017-05-13 NOTE — ED Notes (Signed)
Provided patient with [place] sandwich and ginger ale. Tolerating well.

## 2017-05-13 NOTE — ED Notes (Signed)
MD at bedside. 

## 2017-05-20 ENCOUNTER — Encounter (HOSPITAL_COMMUNITY): Payer: Self-pay | Admitting: Emergency Medicine

## 2017-05-20 ENCOUNTER — Emergency Department (HOSPITAL_COMMUNITY)
Admission: EM | Admit: 2017-05-20 | Discharge: 2017-05-20 | Disposition: A | Discharge status: Home / Self Care | Payer: Medicaid Other | Attending: Emergency Medicine | Admitting: Emergency Medicine

## 2017-05-20 DIAGNOSIS — F1410 Cocaine abuse, uncomplicated: Secondary | ICD-10-CM | POA: Diagnosis not present

## 2017-05-20 DIAGNOSIS — T310 Burns involving less than 10% of body surface: Secondary | ICD-10-CM | POA: Diagnosis not present

## 2017-05-20 DIAGNOSIS — T23321A Burn of third degree of single right finger (nail) except thumb, initial encounter: Secondary | ICD-10-CM | POA: Diagnosis not present

## 2017-05-20 DIAGNOSIS — I252 Old myocardial infarction: Secondary | ICD-10-CM | POA: Diagnosis not present

## 2017-05-20 DIAGNOSIS — Z79899 Other long term (current) drug therapy: Secondary | ICD-10-CM | POA: Diagnosis not present

## 2017-05-20 DIAGNOSIS — Y9389 Activity, other specified: Secondary | ICD-10-CM | POA: Insufficient documentation

## 2017-05-20 DIAGNOSIS — Y929 Unspecified place or not applicable: Secondary | ICD-10-CM | POA: Diagnosis not present

## 2017-05-20 DIAGNOSIS — F17210 Nicotine dependence, cigarettes, uncomplicated: Secondary | ICD-10-CM | POA: Insufficient documentation

## 2017-05-20 DIAGNOSIS — Y998 Other external cause status: Secondary | ICD-10-CM | POA: Diagnosis not present

## 2017-05-20 DIAGNOSIS — J45909 Unspecified asthma, uncomplicated: Secondary | ICD-10-CM | POA: Insufficient documentation

## 2017-05-20 DIAGNOSIS — F1210 Cannabis abuse, uncomplicated: Secondary | ICD-10-CM | POA: Insufficient documentation

## 2017-05-20 DIAGNOSIS — X030XXA Exposure to flames in controlled fire, not in building or structure, initial encounter: Secondary | ICD-10-CM | POA: Insufficient documentation

## 2017-05-20 DIAGNOSIS — Z23 Encounter for immunization: Secondary | ICD-10-CM | POA: Diagnosis not present

## 2017-05-20 DIAGNOSIS — T23241A Burn of second degree of multiple right fingers (nail), including thumb, initial encounter: Secondary | ICD-10-CM | POA: Diagnosis not present

## 2017-05-20 DIAGNOSIS — S6991XA Unspecified injury of right wrist, hand and finger(s), initial encounter: Secondary | ICD-10-CM | POA: Diagnosis present

## 2017-05-20 MED ORDER — CEPHALEXIN 500 MG PO CAPS
500.0000 mg | ORAL_CAPSULE | Freq: Once | ORAL | Status: AC
  Administered 2017-05-20: 500 mg via ORAL
  Filled 2017-05-20: qty 1

## 2017-05-20 MED ORDER — SILVER SULFADIAZINE 1 % EX CREA
TOPICAL_CREAM | Freq: Once | CUTANEOUS | Status: AC
  Administered 2017-05-20: 1 via TOPICAL
  Filled 2017-05-20: qty 50

## 2017-05-20 MED ORDER — HYDROCODONE-ACETAMINOPHEN 5-325 MG PO TABS: 1.0000 | ORAL_TABLET | ORAL | 0 refills | Status: DC | PRN

## 2017-05-20 MED ORDER — TETANUS-DIPHTH-ACELL PERTUSSIS 5-2.5-18.5 LF-MCG/0.5 IM SUSP
0.5000 mL | Freq: Once | INTRAMUSCULAR | Status: AC
  Administered 2017-05-20: 0.5 mL via INTRAMUSCULAR
  Filled 2017-05-20: qty 0.5

## 2017-05-20 MED ORDER — CEPHALEXIN 500 MG PO CAPS: 500.0000 mg | ORAL_CAPSULE | Freq: Four times a day (QID) | ORAL | 0 refills | Status: DC
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
## 2017-05-20 NOTE — ED Notes (Signed)
Helped patient with taping gauze around right index finger.

## 2017-06-02 ENCOUNTER — Other Ambulatory Visit: Payer: Self-pay

## 2017-06-02 ENCOUNTER — Encounter (HOSPITAL_COMMUNITY): Payer: Self-pay | Admitting: *Deleted

## 2017-06-02 ENCOUNTER — Emergency Department (HOSPITAL_COMMUNITY)
Admission: EM | Admit: 2017-06-02 | Discharge: 2017-06-02 | Disposition: A | Discharge status: Home / Self Care | Payer: Medicaid Other | Attending: Emergency Medicine | Admitting: Emergency Medicine

## 2017-06-02 DIAGNOSIS — F17210 Nicotine dependence, cigarettes, uncomplicated: Secondary | ICD-10-CM | POA: Insufficient documentation

## 2017-06-02 DIAGNOSIS — Z79899 Other long term (current) drug therapy: Secondary | ICD-10-CM | POA: Insufficient documentation

## 2017-06-02 DIAGNOSIS — J45909 Unspecified asthma, uncomplicated: Secondary | ICD-10-CM | POA: Insufficient documentation

## 2017-06-02 DIAGNOSIS — Y929 Unspecified place or not applicable: Secondary | ICD-10-CM | POA: Insufficient documentation

## 2017-06-02 DIAGNOSIS — Z09 Encounter for follow-up examination after completed treatment for conditions other than malignant neoplasm: Secondary | ICD-10-CM

## 2017-06-02 DIAGNOSIS — X58XXXD Exposure to other specified factors, subsequent encounter: Secondary | ICD-10-CM | POA: Insufficient documentation

## 2017-06-02 DIAGNOSIS — T23001D Burn of unspecified degree of right hand, unspecified site, subsequent encounter: Secondary | ICD-10-CM | POA: Insufficient documentation

## 2017-06-02 DIAGNOSIS — Y939 Activity, unspecified: Secondary | ICD-10-CM | POA: Diagnosis not present

## 2017-06-02 DIAGNOSIS — Y999 Unspecified external cause status: Secondary | ICD-10-CM | POA: Insufficient documentation

## 2017-06-02 MED ORDER — HYDROCODONE-ACETAMINOPHEN 5-325 MG PO TABS
1.0000 | ORAL_TABLET | Freq: Once | ORAL | Status: DC
  Filled 2017-06-02: qty 1

## 2017-06-02 MED ORDER — SILVER SULFADIAZINE 1 % EX CREA
TOPICAL_CREAM | Freq: Once | CUTANEOUS | Status: AC
  Administered 2017-06-02: 1 via TOPICAL
  Filled 2017-06-02: qty 50

## 2017-06-02 MED ORDER — ACETAMINOPHEN ER 650 MG PO TBCR: 650.0000 mg | EXTENDED_RELEASE_TABLET | Freq: Three times a day (TID) | ORAL | 0 refills | Status: DC | PRN

## 2017-06-02 MED ORDER — HYDROCODONE-ACETAMINOPHEN 5-325 MG PO TABS
1.0000 | ORAL_TABLET | Freq: Once | ORAL | Status: AC
  Administered 2017-06-02: 1 via ORAL
  Filled 2017-06-02: qty 1

## 2017-06-02 NOTE — ED Triage Notes (Signed)
Pt declined treatment for detox at this time

## 2017-06-02 NOTE — Discharge Instructions (Addendum)
Follow up with the doctor you were told to follow up with after the last visit you had. Change the burn dressing every 12 hours.

## 2017-06-02 NOTE — ED Notes (Signed)
Called for triage no response 
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
## 2017-06-03 ENCOUNTER — Emergency Department (HOSPITAL_COMMUNITY)
Admission: EM | Admit: 2017-06-03 | Discharge: 2017-06-03 | Disposition: A | Discharge status: Home / Self Care | Payer: Medicaid Other | Source: Home / Self Care | Attending: Emergency Medicine | Admitting: Emergency Medicine

## 2017-06-03 ENCOUNTER — Emergency Department (HOSPITAL_COMMUNITY)
Admission: EM | Admit: 2017-06-03 | Discharge: 2017-06-03 | Disposition: A | Discharge status: Home / Self Care | Payer: Medicaid Other | Attending: Emergency Medicine | Admitting: Emergency Medicine

## 2017-06-03 ENCOUNTER — Encounter (HOSPITAL_COMMUNITY): Payer: Self-pay | Admitting: Emergency Medicine

## 2017-06-03 ENCOUNTER — Emergency Department (HOSPITAL_COMMUNITY): Payer: Medicaid Other

## 2017-06-03 ENCOUNTER — Other Ambulatory Visit: Payer: Self-pay

## 2017-06-03 DIAGNOSIS — J40 Bronchitis, not specified as acute or chronic: Secondary | ICD-10-CM

## 2017-06-03 DIAGNOSIS — J209 Acute bronchitis, unspecified: Secondary | ICD-10-CM | POA: Diagnosis not present

## 2017-06-03 DIAGNOSIS — F17210 Nicotine dependence, cigarettes, uncomplicated: Secondary | ICD-10-CM | POA: Diagnosis not present

## 2017-06-03 DIAGNOSIS — J069 Acute upper respiratory infection, unspecified: Secondary | ICD-10-CM

## 2017-06-03 DIAGNOSIS — J45909 Unspecified asthma, uncomplicated: Secondary | ICD-10-CM | POA: Diagnosis not present

## 2017-06-03 DIAGNOSIS — R05 Cough: Secondary | ICD-10-CM | POA: Diagnosis present

## 2017-06-03 DIAGNOSIS — F1010 Alcohol abuse, uncomplicated: Secondary | ICD-10-CM

## 2017-06-03 DIAGNOSIS — Z79899 Other long term (current) drug therapy: Secondary | ICD-10-CM | POA: Insufficient documentation

## 2017-06-03 DIAGNOSIS — B9789 Other viral agents as the cause of diseases classified elsewhere: Principal | ICD-10-CM

## 2017-06-03 LAB — CBC WITH DIFFERENTIAL/PLATELET
Basophils Absolute: 0 10*3/uL (ref 0.0–0.1)
Basophils Relative: 0 %
Eosinophils Absolute: 0.1 10*3/uL (ref 0.0–0.7)
Eosinophils Relative: 1 %
HCT: 41.7 % (ref 39.0–52.0)
Hemoglobin: 13.8 g/dL (ref 13.0–17.0)
Lymphocytes Relative: 32 %
Lymphs Abs: 2.7 10*3/uL (ref 0.7–4.0)
MCH: 30.3 pg (ref 26.0–34.0)
MCHC: 33.1 g/dL (ref 30.0–36.0)
MCV: 91.4 fL (ref 78.0–100.0)
Monocytes Absolute: 0.6 10*3/uL (ref 0.1–1.0)
Monocytes Relative: 7 %
Neutro Abs: 5.1 10*3/uL (ref 1.7–7.7)
Neutrophils Relative %: 60 %
Platelets: 275 10*3/uL (ref 150–400)
RBC: 4.56 MIL/uL (ref 4.22–5.81)
RDW: 13.9 % (ref 11.5–15.5)
WBC: 8.6 10*3/uL (ref 4.0–10.5)

## 2017-06-03 LAB — BASIC METABOLIC PANEL
Anion gap: 7 (ref 5–15)
BUN: 18 mg/dL (ref 6–20)
CO2: 27 mmol/L (ref 22–32)
Calcium: 8.8 mg/dL — ABNORMAL LOW (ref 8.9–10.3)
Chloride: 108 mmol/L (ref 101–111)
Creatinine, Ser: 2.8 mg/dL — ABNORMAL HIGH (ref 0.61–1.24)
GFR calc Af Amer: 33 mL/min — ABNORMAL LOW (ref >60)
GFR calc non Af Amer: 28 mL/min — ABNORMAL LOW (ref >60)
Glucose, Bld: 99 mg/dL (ref 65–99)
Potassium: 3.6 mmol/L (ref 3.5–5.1)
Sodium: 142 mmol/L (ref 135–145)

## 2017-06-03 LAB — ETHANOL: Alcohol, Ethyl (B): <10 mg/dL (ref <10)

## 2017-06-03 MED ORDER — GUAIFENESIN ER 1200 MG PO TB12: 1.0000 | ORAL_TABLET | Freq: Two times a day (BID) | ORAL | 0 refills | Status: DC

## 2017-06-03 MED ORDER — ALBUTEROL SULFATE HFA 108 (90 BASE) MCG/ACT IN AERS
2.0000 | INHALATION_SPRAY | RESPIRATORY_TRACT | Status: DC | PRN
  Administered 2017-06-03: 2 via RESPIRATORY_TRACT
  Filled 2017-06-03: qty 6.7

## 2017-06-03 MED ORDER — SODIUM CHLORIDE 0.9 % IV BOLUS (SEPSIS)
1000.0000 mL | Freq: Once | INTRAVENOUS | Status: AC
Start: 2017-06-03 — End: 2017-06-03
  Administered 2017-06-03: 1000 mL via INTRAVENOUS

## 2017-06-03 MED ORDER — AEROCHAMBER PLUS FLO-VU LARGE MISC
1.0000 | Freq: Once | Status: AC
  Administered 2017-06-03: 1

## 2017-06-03 MED ORDER — ALBUTEROL SULFATE HFA 108 (90 BASE) MCG/ACT IN AERS
2.0000 | INHALATION_SPRAY | RESPIRATORY_TRACT | Status: DC
  Administered 2017-06-03: 2 via RESPIRATORY_TRACT
  Filled 2017-06-03: qty 6.7

## 2017-06-03 MED ORDER — ALBUTEROL SULFATE (2.5 MG/3ML) 0.083% IN NEBU
5.0000 mg | INHALATION_SOLUTION | Freq: Once | RESPIRATORY_TRACT | Status: AC
  Administered 2017-06-03: 5 mg via RESPIRATORY_TRACT
  Filled 2017-06-03: qty 6

## 2017-06-03 MED ORDER — ACETAMINOPHEN-CODEINE 120-12 MG/5ML PO SOLN: 10.0000 mL | ORAL | 0 refills | Status: DC | PRN

## 2017-06-03 MED ORDER — IPRATROPIUM BROMIDE 0.02 % IN SOLN
0.5000 mg | RESPIRATORY_TRACT | Status: AC
  Administered 2017-06-03: 0.5 mg via RESPIRATORY_TRACT
  Filled 2017-06-03: qty 2.5

## 2017-06-03 MED ORDER — PREDNISONE 50 MG PO TABS: 50.0000 mg | ORAL_TABLET | Freq: Every day | ORAL | 0 refills | Status: DC

## 2017-06-03 NOTE — ED Triage Notes (Signed)
Pt is c/o cough and cold like symptoms  Feels achy and run down  States had a cup of alcohol tonight

## 2017-06-03 NOTE — ED Notes (Signed)
Pt states [place] lives [address], by [facility]

## 2017-06-03 NOTE — ED Provider Notes (Signed)
[facility] DEPT Provider Note   CSN: [id number] Arrival date & time: 06/03/17  0108     History   Chief Complaint Chief Complaint  Patient presents with  . Cough    HPI William Gregory is a 31 y.o. male.  HPI Patient is a 31 year old male who presents the emergency department with ongoing productive cough over the past several days.  He states he has had some myalgias.  He denies fevers and chills.  Admits to alcohol and drug use.  Denies suicidal and homicidal thoughts.  No other complaints.  Symptoms are mild in severity. Does have a hx of asthma. Does not have an inhaler    Past Medical History:  Diagnosis Date  . Alcohol abuse   . Asthma   . Back pain   . Depression   . Knee joint pain, left   . Psychiatric problem    unspecified by patient (receives "injections" bi-weekly)  . Tobacco abuse     Patient Active Problem List   Diagnosis Date Noted  . AKI (acute kidney injury) ([place]) 08/14/2015  . Nausea vomiting and diarrhea 08/14/2015  . Abdominal pain 08/14/2015  . GERD (gastroesophageal reflux disease) 08/14/2015  . Acute kidney injury ([place])   . ACUTE KIDNEY FAILURE UNSPECIFIED 07/09/2009  . TOBACCO USER 06/07/2009  . KNEE PAIN, RIGHT, CHRONIC 01/15/2009  . Backache 01/15/2009  . LEG EDEMA, BILATERAL 12/15/2008  . Alcohol abuse 04/02/2008  . Depression 04/02/2008  . MYOCARDIAL INFARCTION, HX OF 04/02/2008  . Asthma 04/02/2008    History reviewed. No pertinent surgical history.     Home Medications    Prior to Admission medications   Medication Sig Start Date End Date Taking? Authorizing Provider  acetaminophen (TYLENOL 8 HOUR) 650 MG CR tablet Take 1 tablet (650 mg total) by mouth every 8 (eight) hours as needed for pain. 06/02/17   Ashley Murrain, NP  cephALEXin (KEFLEX) 500 MG capsule Take 1 capsule (500 mg total) by mouth 4 (four) times daily. 05/20/17   Joy, Shawn C, PA-C  citalopram (CELEXA) 20 MG tablet Take  20 mg by mouth daily.    [provider]  HYDROcodone-acetaminophen (NORCO/VICODIN) 5-325 MG tablet Take 1-2 tablets by mouth every 4 (four) hours as needed for severe pain. 05/20/17   Lorayne Bender, PA-C    Family History Family History  Problem Relation Age of Onset  . Diabetes Mother   . Diabetes Father     Social History Social History   Tobacco Use  . Smoking status: Current Every Day Smoker    Packs/day: 1.00    Types: Cigarettes  . Smokeless tobacco: Never Used  Substance Use Topics  . Alcohol use: Yes    Comment: daily use. Patient states he drank 2 4ounce beers this morning  . Drug use: Yes    Types: Cocaine, Marijuana    Comment: last used 2-3 days ago.     Allergies   Aspirin and Ibuprofen   Review of Systems Review of Systems  All other systems reviewed and are negative.    Physical Exam Updated Vital Signs BP (!) 145/87 (BP Location: Right Arm)   Pulse 62   Temp 97.7 F (36.5 C) (Oral)   Resp 18   SpO2 98%   Physical Exam  Constitutional: He is oriented to person, place, and time. He appears well-developed and well-nourished.  HENT:  Head: Normocephalic and atraumatic.  Posterior pharynx normal.  Tolerating secretions.  Eyes: EOM are  normal.  Neck: Normal range of motion. Neck supple.  No crepitus  Cardiovascular: Normal rate, regular rhythm, normal heart sounds and intact distal pulses.  Pulmonary/Chest: Effort normal and breath sounds normal. No respiratory distress. He has no wheezes.  Abdominal: Soft. He exhibits no distension. There is no tenderness.  Musculoskeletal: Normal range of motion.  Neurological: He is alert and oriented to person, place, and time.  Skin: Skin is warm and dry.  Psychiatric: He has a normal mood and affect. Judgment normal.  Nursing note and vitals reviewed.    ED Treatments / Results  Labs (all labs ordered are listed, but only abnormal results are displayed) Labs Reviewed - No data to  display  EKG  EKG Interpretation None       Radiology Dg Chest 2 View  Result Date: 06/03/2017 CLINICAL DATA:  31 year old male with productive cough. EXAM: CHEST  2 VIEW COMPARISON:  Chest radiograph dated 05/13/2017 FINDINGS: The heart size and mediastinal contours are within normal limits. Both lungs are clear. The visualized skeletal structures are unremarkable. IMPRESSION: No active cardiopulmonary disease. Electronically Signed   By: Anner Crete M.D.   On: 06/03/2017 03:34    Procedures Procedures (including critical care time)  Medications Ordered in ED Medications - No data to display   Initial Impression / Assessment and Plan / ED Course  I have reviewed the triage vital signs and the nursing notes.  Pertinent labs & imaging results that were available during my care of the patient were reviewed by me and considered in my medical decision making (see chart for details).     Likely viral bronchitis.  Overall well-appearing.  Vital signs stable chief.  Chest x-ray normal.  Discharged home in good condition.  Patient given albuterol inhaler prior to discharge  Final Clinical Impressions(s) / ED Diagnoses   Final diagnoses:  Acute bronchitis, unspecified organism    ED Discharge Orders    None       Jola Schmidt, MD 06/03/17 [phone]

## 2017-06-03 NOTE — ED Triage Notes (Addendum)
Pt reports productive cough with white sputum, congestion, and sob x 1 week.  Discharged from [facility] this morning for same and states "they didn't do a good job because they were busy and rushing everyone out."  States "they did a chest x-ray but it wasn't a good one."  Also requesting detox from ETOH.  Last ETOH just pta.  States he is homeless.

## 2017-06-03 NOTE — ED Triage Notes (Signed)
Pt states he has been feeling short of breath and achy like he is coming down with the flu  Pt states he drank liquor today and smoked three blunts but states he is trying to detox from alcohol and drugs  Pt states he feels like he has a piece of wire in his throat  Pt states he has an occasional cough with yellow phlegm

## 2017-06-03 NOTE — ED Notes (Signed)
Patient verbalizes understanding of discharge instructions. Opportunity for questioning and answers were provided. Armband removed by staff, pt discharged from ED ambulatory with mother.

## 2017-06-03 NOTE — ED Notes (Signed)
Patient came through [place], updated on delays and wait times

## 2017-06-03 NOTE — ED Notes (Signed)
Pt chatting with mother, no labored or difficulty breathing

## 2017-06-03 NOTE — ED Provider Notes (Signed)
[facility] EMERGENCY DEPARTMENT Provider Note   CSN: [id number] Arrival date & time: 06/03/17  1723     History   Chief Complaint Chief Complaint  Patient presents with  . Cough  . Shortness of Breath  . Alcohol Problem    HPI William Gregory is a 31 y.o. male.  HPI Patient presents to the emergency department with cough and upper respiratory symptoms.  Patient is also wanting detox from alcohol.  She denies any suicidal homicidal ideation patient states he said cough with nasal congestion over the last 3 days.  Patient states he did not take any medication prior to arrival.  The patient denies chest pain, shortness of breath, headache,blurred vision, neck pain, fever, weakness, numbness, dizziness, anorexia, edema, abdominal pain, nausea, vomiting, diarrhea, rash, back pain, dysuria, hematemesis, bloody stool, near syncope, or syncope. Past Medical History:  Diagnosis Date  . Alcohol abuse   . Asthma   . Back pain   . Depression   . Knee joint pain, left   . Psychiatric problem    unspecified by patient (receives "injections" bi-weekly)  . Tobacco abuse     Patient Active Problem List   Diagnosis Date Noted  . AKI (acute kidney injury) ([place]) 08/14/2015  . Nausea vomiting and diarrhea 08/14/2015  . Abdominal pain 08/14/2015  . GERD (gastroesophageal reflux disease) 08/14/2015  . Acute kidney injury ([place])   . ACUTE KIDNEY FAILURE UNSPECIFIED 07/09/2009  . TOBACCO USER 06/07/2009  . KNEE PAIN, RIGHT, CHRONIC 01/15/2009  . Backache 01/15/2009  . LEG EDEMA, BILATERAL 12/15/2008  . Alcohol abuse 04/02/2008  . Depression 04/02/2008  . MYOCARDIAL INFARCTION, HX OF 04/02/2008  . Asthma 04/02/2008    History reviewed. No pertinent surgical history.     Home Medications    Prior to Admission medications   Medication Sig Start Date End Date Taking? Authorizing Provider  cephALEXin (KEFLEX) 500 MG capsule Take 1 capsule (500 mg total) by mouth  4 (four) times daily. 05/20/17  Yes Joy, Shawn C, PA-C  citalopram (CELEXA) 20 MG tablet Take 20 mg by mouth daily.   Yes [provider]  acetaminophen (TYLENOL 8 HOUR) 650 MG CR tablet Take 1 tablet (650 mg total) by mouth every 8 (eight) hours as needed for pain. Patient not taking: Reported on 06/03/2017 06/02/17   Ashley Murrain, NP  acetaminophen-codeine 120-12 MG/5ML solution Take 10 mLs by mouth every 4 (four) hours as needed for moderate pain. 06/03/17   Daemon Dowty, Harrell Gave, PA-C  Guaifenesin 1200 MG TB12 Take 1 tablet (1,200 mg total) by mouth 2 (two) times daily. 06/03/17   Laketia Vicknair, Harrell Gave, PA-C  predniSONE (DELTASONE) 50 MG tablet Take 1 tablet (50 mg total) by mouth daily. 06/03/17   Dalia Heading, PA-C    Family History Family History  Problem Relation Age of Onset  . Diabetes Mother   . Diabetes Father     Social History Social History   Tobacco Use  . Smoking status: Current Every Day Smoker    Packs/day: 1.00    Types: Cigarettes  . Smokeless tobacco: Never Used  Substance Use Topics  . Alcohol use: Yes  . Drug use: Yes    Types: Cocaine, Marijuana     Allergies   Aspirin and Ibuprofen   Review of Systems Review of Systems All other systems negative except as documented in the HPI. All pertinent positives and negatives as reviewed in the HPI.  Physical Exam Updated Vital Signs BP (!) 140/91  Pulse 78   Temp 97.7 F (36.5 C) (Oral)   Resp 16   Ht 5\' 5"  (1.651 m)   Wt 102.5 kg (226 lb)   SpO2 99%   BMI 37.61 kg/m   Physical Exam  Constitutional: He is oriented to person, place, and time. He appears well-developed and well-nourished. No distress.  HENT:  Head: Normocephalic and atraumatic.  Mouth/Throat: Oropharynx is clear and moist.  Eyes: Pupils are equal, round, and reactive to light.  Neck: Normal range of motion. Neck supple.  Cardiovascular: Normal rate, regular rhythm and normal heart sounds. Exam reveals no gallop  and no friction rub.  No murmur heard. Pulmonary/Chest: Effort normal and breath sounds normal. No respiratory distress. He has no wheezes. He has no rhonchi. He has no rales.  Abdominal: Soft. Bowel sounds are normal. He exhibits no distension. There is no tenderness.  Neurological: He is alert and oriented to person, place, and time. He exhibits normal muscle tone. Coordination normal.  Skin: Skin is warm and dry. Capillary refill takes less than 2 seconds. No rash noted. No erythema.  Psychiatric: He has a normal mood and affect. His behavior is normal.  Nursing note and vitals reviewed.    ED Treatments / Results  Labs (all labs ordered are listed, but only abnormal results are displayed) Labs Reviewed  BASIC METABOLIC PANEL - Abnormal; Notable for the following components:      Result Value   Creatinine, Ser 2.80 (*)    Calcium 8.8 (*)    GFR calc non Af Amer 28 (*)    GFR calc Af Amer 33 (*)    All other components within normal limits  CBC WITH DIFFERENTIAL/PLATELET  ETHANOL    EKG  EKG Interpretation None       Radiology Dg Chest 2 View  Result Date: 06/03/2017 CLINICAL DATA:  31 year old male with productive cough. EXAM: CHEST  2 VIEW COMPARISON:  Chest radiograph dated 05/13/2017 FINDINGS: The heart size and mediastinal contours are within normal limits. Both lungs are clear. The visualized skeletal structures are unremarkable. IMPRESSION: No active cardiopulmonary disease. Electronically Signed   By: Anner Crete M.D.   On: 06/03/2017 03:34    Procedures Procedures (including critical care time)  Medications Ordered in ED Medications  albuterol (PROVENTIL HFA;VENTOLIN HFA) 108 (90 Base) MCG/ACT inhaler 2 puff (not administered)  AEROCHAMBER PLUS FLO-VU MEDIUM MISC 1 each (not administered)  sodium chloride 0.9 % bolus 1,000 mL (0 mLs Intravenous Stopped 06/03/17 2250)  albuterol (PROVENTIL) (2.5 MG/3ML) 0.083% nebulizer solution 5 mg (5 mg Nebulization  Given 06/03/17 2125)  ipratropium (ATROVENT) nebulizer solution 0.5 mg (0.5 mg Nebulization Given 06/03/17 2126)     Initial Impression / Assessment and Plan / ED Course  I have reviewed the triage vital signs and the nursing notes.  Pertinent labs & imaging results that were available during my care of the patient were reviewed by me and considered in my medical decision making (see chart for details).    Patient will be treated for viral upper respiratory infection.  Given resources for alcohol treatment programs.  Patient is advised of the plan and all questions were answered.  Final Clinical Impressions(s) / ED Diagnoses   Final diagnoses:  Viral URI with cough  Alcohol abuse  Bronchitis    ED Discharge Orders        Ordered    Guaifenesin 1200 MG TB12  2 times daily     06/03/17 2303    predniSONE (  DELTASONE) 50 MG tablet  Daily     06/03/17 2303    acetaminophen-codeine 120-12 MG/5ML solution  Every 4 hours PRN     06/03/17 2303       Dalia Heading, PA-C 06/05/17 0301    Mabe, Forbes Cellar, MD 06/06/17 [id number]

## 2017-06-03 NOTE — Discharge Instructions (Addendum)
Return here as needed. Follow up with a primary doctor.

## 2017-06-03 NOTE — ED Notes (Signed)
CIWA less than 8 so labs not ordered per protocol.

## 2017-09-16 ENCOUNTER — Other Ambulatory Visit: Payer: Self-pay

## 2017-09-16 ENCOUNTER — Emergency Department (HOSPITAL_COMMUNITY)
Admission: EM | Admit: 2017-09-16 | Discharge: 2017-09-16 | Discharge status: Against Medical Advice | Payer: Medicaid Other

## 2017-09-16 NOTE — ED Notes (Signed)
No answer in waiting area.

## 2017-09-16 NOTE — ED Notes (Signed)
Called for room X 2.

## 2017-09-18 ENCOUNTER — Emergency Department (HOSPITAL_COMMUNITY)
Admission: EM | Admit: 2017-09-18 | Discharge: 2017-09-18 | Disposition: A | Discharge status: Home / Self Care | Payer: Medicaid Other | Attending: Emergency Medicine | Admitting: Emergency Medicine

## 2017-09-18 ENCOUNTER — Encounter (HOSPITAL_COMMUNITY): Payer: Self-pay | Admitting: Emergency Medicine

## 2017-09-18 DIAGNOSIS — R220 Localized swelling, mass and lump, head: Secondary | ICD-10-CM | POA: Diagnosis not present

## 2017-09-18 DIAGNOSIS — Z008 Encounter for other general examination: Secondary | ICD-10-CM | POA: Diagnosis not present

## 2017-09-18 DIAGNOSIS — Z5321 Procedure and treatment not carried out due to patient leaving prior to being seen by health care provider: Secondary | ICD-10-CM | POA: Diagnosis not present

## 2017-09-18 NOTE — ED Notes (Signed)
Pt states he also wants to be seen for detox and that he has had 1/5th of Aristocrat Vodka before coming in today as well Cocaine Pt states 2 grams.
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
## 2018-02-09 ENCOUNTER — Encounter (HOSPITAL_COMMUNITY): Payer: Self-pay | Admitting: Emergency Medicine

## 2018-02-09 ENCOUNTER — Emergency Department (HOSPITAL_COMMUNITY)
Admission: EM | Admit: 2018-02-09 | Discharge: 2018-02-09 | Disposition: A | Discharge status: Home / Self Care | Payer: Medicaid Other | Attending: Emergency Medicine | Admitting: Emergency Medicine

## 2018-02-09 ENCOUNTER — Emergency Department (HOSPITAL_COMMUNITY): Payer: Medicaid Other

## 2018-02-09 DIAGNOSIS — R220 Localized swelling, mass and lump, head: Secondary | ICD-10-CM | POA: Diagnosis not present

## 2018-02-09 DIAGNOSIS — Z79899 Other long term (current) drug therapy: Secondary | ICD-10-CM | POA: Insufficient documentation

## 2018-02-09 DIAGNOSIS — K047 Periapical abscess without sinus: Secondary | ICD-10-CM

## 2018-02-09 DIAGNOSIS — J45909 Unspecified asthma, uncomplicated: Secondary | ICD-10-CM | POA: Diagnosis not present

## 2018-02-09 DIAGNOSIS — K0889 Other specified disorders of teeth and supporting structures: Secondary | ICD-10-CM | POA: Insufficient documentation

## 2018-02-09 DIAGNOSIS — F17210 Nicotine dependence, cigarettes, uncomplicated: Secondary | ICD-10-CM | POA: Diagnosis not present

## 2018-02-09 DIAGNOSIS — R030 Elevated blood-pressure reading, without diagnosis of hypertension: Secondary | ICD-10-CM | POA: Diagnosis not present

## 2018-02-09 DIAGNOSIS — I252 Old myocardial infarction: Secondary | ICD-10-CM | POA: Insufficient documentation

## 2018-02-09 MED ORDER — ACETAMINOPHEN 325 MG PO TABS
650.0000 mg | ORAL_TABLET | Freq: Once | ORAL | Status: DC
  Filled 2018-02-09: qty 2

## 2018-02-09 MED ORDER — AMOXICILLIN-POT CLAVULANATE 875-125 MG PO TABS: 1.0000 | ORAL_TABLET | Freq: Two times a day (BID) | ORAL | 0 refills | Status: AC
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
## 2018-02-09 NOTE — ED Notes (Signed)
Patient ambulated to room with steady gait.

## 2018-02-09 NOTE — ED Notes (Signed)
Patient called out for pain medicine, this RN went to medicate patient who was sleeping and hard to arouse. Patient states "I just want to sleep right now". Given ice pack and patient resting.

## 2018-02-09 NOTE — ED Notes (Signed)
Patient transported to CT 

## 2018-02-10 ENCOUNTER — Encounter (HOSPITAL_COMMUNITY): Payer: Self-pay

## 2018-02-10 ENCOUNTER — Emergency Department (HOSPITAL_COMMUNITY)
Admission: EM | Admit: 2018-02-10 | Discharge: 2018-02-10 | Disposition: A | Discharge status: Home / Self Care | Payer: Medicaid Other | Attending: Emergency Medicine | Admitting: Emergency Medicine

## 2018-02-10 DIAGNOSIS — H5789 Other specified disorders of eye and adnexa: Secondary | ICD-10-CM | POA: Diagnosis not present

## 2018-02-10 DIAGNOSIS — J45909 Unspecified asthma, uncomplicated: Secondary | ICD-10-CM | POA: Insufficient documentation

## 2018-02-10 DIAGNOSIS — Z79899 Other long term (current) drug therapy: Secondary | ICD-10-CM | POA: Insufficient documentation

## 2018-02-10 DIAGNOSIS — F17210 Nicotine dependence, cigarettes, uncomplicated: Secondary | ICD-10-CM | POA: Insufficient documentation

## 2018-02-10 NOTE — ED Notes (Signed)
Called for patient x 3 in lobby with no response
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
## 2018-02-10 NOTE — Discharge Instructions (Addendum)
Sleep with your head inclined, such as on multiple pillows or in a chair, to reduce swelling. May apply ice for no more than 15 minutes at a time. Acetaminophen: May take acetaminophen (generic for Tylenol), as needed, for pain. Your daily total maximum amount of acetaminophen from all sources should be limited to 4000mg /day for persons without liver problems, or 2000mg /day for those with liver problems. Follow-up with a primary care provider for any further management of this issue. Follow-up with ophthalmology or return to the ED should any symptoms worsen.

## 2018-02-10 NOTE — ED Notes (Signed)
During discharge patient expressed anger that he was not prescribed percocet for his pain.  This RN apologized and explained EDP recommended most appropriate pain control.  Patient snatched paperwork from this RNs hands and left without further explanation of discharge paperwork or discharge vitals.

## 2018-04-04 ENCOUNTER — Emergency Department (HOSPITAL_COMMUNITY)
Admission: EM | Admit: 2018-04-04 | Discharge: 2018-04-04 | Disposition: A | Discharge status: Home / Self Care | Payer: Medicaid Other | Attending: Emergency Medicine | Admitting: Emergency Medicine

## 2018-04-04 ENCOUNTER — Encounter (HOSPITAL_COMMUNITY): Payer: Self-pay | Admitting: Emergency Medicine

## 2018-04-04 ENCOUNTER — Other Ambulatory Visit: Payer: Self-pay

## 2018-04-04 ENCOUNTER — Emergency Department (HOSPITAL_COMMUNITY): Payer: Medicaid Other

## 2018-04-04 DIAGNOSIS — R0789 Other chest pain: Secondary | ICD-10-CM

## 2018-04-04 DIAGNOSIS — Z79899 Other long term (current) drug therapy: Secondary | ICD-10-CM | POA: Diagnosis not present

## 2018-04-04 DIAGNOSIS — J45909 Unspecified asthma, uncomplicated: Secondary | ICD-10-CM | POA: Insufficient documentation

## 2018-04-04 DIAGNOSIS — F17210 Nicotine dependence, cigarettes, uncomplicated: Secondary | ICD-10-CM | POA: Insufficient documentation

## 2018-04-04 MED ORDER — CYCLOBENZAPRINE HCL 10 MG PO TABS: 10.0000 mg | ORAL_TABLET | Freq: Two times a day (BID) | ORAL | 0 refills | Status: DC | PRN

## 2018-04-04 MED ORDER — LIDOCAINE 5 % EX PTCH: 1.0000 | MEDICATED_PATCH | CUTANEOUS | 0 refills | Status: DC

## 2018-04-04 NOTE — Discharge Instructions (Signed)
Apply lidoderm patch to affected area for comfort.  Take flexeril as needed for pain and spasm.  Follow up with your doctor for further care.  Return if you have any concerns.

## 2018-04-04 NOTE — ED Provider Notes (Signed)
[facility] EMERGENCY DEPARTMENT Provider Note   CSN: [id number] Arrival date & time: 04/04/18  0009     History   Chief Complaint Chief Complaint  Patient presents with  . Arm Pain  . Back Pain    HPI William Gregory is a 32 y.o. male.  The history is provided by the patient. No language interpreter was used.     32 year old male with history of psychiatric illness, alcohol abuse, depression, chronic back pain presenting complaining of upper back, chest and shoulder pain.  Patient states for the past several days he has had sharp throbbing pain primarily to his left upper back, left shoulder and chest.  Pain is described as sharp, and tight and cramping.  Pain is moderate in severity.  Pain is worsening with movement and improves with rest.  He has tried ibuprofen and pain medication at home without adequate relief.  No report of fever but he does endorse chills.  No shortness of breath, productive cough, hemoptysis, neck pain abdominal pain nausea vomiting or rash.  He did report having some intermittent loose stools several days prior but that has since resolved.  He attributed his symptoms due to heavy lifting, and prolonged walking.  He does admits to using tobacco and alcohol products.  He denies any recent injury.  Past Medical History:  Diagnosis Date  . Alcohol abuse   . Asthma   . Back pain   . Depression   . Knee joint pain, left   . Psychiatric problem    unspecified by patient (receives "injections" bi-weekly)  . Tobacco abuse     Patient Active Problem List   Diagnosis Date Noted  . AKI (acute kidney injury) ([place]) 08/14/2015  . Nausea vomiting and diarrhea 08/14/2015  . Abdominal pain 08/14/2015  . GERD (gastroesophageal reflux disease) 08/14/2015  . Acute kidney injury ([place])   . ACUTE KIDNEY FAILURE UNSPECIFIED 07/09/2009  . TOBACCO USER 06/07/2009  . KNEE PAIN, RIGHT, CHRONIC 01/15/2009  . Backache 01/15/2009  . LEG EDEMA,  BILATERAL 12/15/2008  . Alcohol abuse 04/02/2008  . Depression 04/02/2008  . MYOCARDIAL INFARCTION, HX OF 04/02/2008  . Asthma 04/02/2008    History reviewed. No pertinent surgical history.      Home Medications    Prior to Admission medications   Medication Sig Start Date End Date Taking? Authorizing Provider  acetaminophen (TYLENOL 8 HOUR) 650 MG CR tablet Take 1 tablet (650 mg total) by mouth every 8 (eight) hours as needed for pain. Patient not taking: Reported on 06/03/2017 06/02/17   Ashley Murrain, NP  acetaminophen-codeine 120-12 MG/5ML solution Take 10 mLs by mouth every 4 (four) hours as needed for moderate pain. Patient not taking: Reported on 02/09/2018 06/03/17   Dalia Heading, PA-C  albuterol (PROVENTIL HFA;VENTOLIN HFA) 108 (90 Base) MCG/ACT inhaler Inhale 1-2 puffs into the lungs every 6 (six) hours as needed for wheezing or shortness of breath.    [provider]  cephALEXin (KEFLEX) 500 MG capsule Take 1 capsule (500 mg total) by mouth 4 (four) times daily. Patient not taking: Reported on 02/09/2018 05/20/17   Lorayne Bender, PA-C  citalopram (CELEXA) 20 MG tablet Take 20 mg by mouth daily.    [provider]  Guaifenesin 1200 MG TB12 Take 1 tablet (1,200 mg total) by mouth 2 (two) times daily. Patient not taking: Reported on 02/09/2018 06/03/17   Dalia Heading, PA-C    Family History Family History  Problem Relation Age of  Onset  . Diabetes Mother   . Diabetes Father     Social History Social History   Tobacco Use  . Smoking status: Current Every Day Smoker    Packs/day: 1.00    Types: Cigarettes  . Smokeless tobacco: Never Used  Substance Use Topics  . Alcohol use: Yes  . Drug use: Yes    Types: Cocaine, Marijuana     Allergies   Aspirin and Ibuprofen   Review of Systems Review of Systems  All other systems reviewed and are negative.    Physical Exam Updated Vital Signs BP 136/83 (BP Location: Right Arm)   Pulse  65   Temp 98.7 F (37.1 C) (Oral)   Resp 17   SpO2 99%   Physical Exam  Constitutional: He is oriented to person, place, and time. He appears well-developed and well-nourished. No distress.  HENT:  Head: Atraumatic.  Eyes: Conjunctivae are normal.  Neck: Neck supple.  No cervical midline spine tenderness  Cardiovascular: Normal rate and regular rhythm.  Pulmonary/Chest: Effort normal and breath sounds normal. He exhibits tenderness (Tenderness to left upper chest wall on palpation and tenderness along left shoulder with decreased shoulder range of motion.  No overlying skin changes ).  Abdominal: Bowel sounds are normal.  Neurological: He is alert and oriented to person, place, and time.  Skin: No rash noted.  Psychiatric: He has a normal mood and affect.  Nursing note and vitals reviewed.    ED Treatments / Results  Labs (all labs ordered are listed, but only abnormal results are displayed) Labs Reviewed - No data to display  EKG EKG Interpretation  Date/Time:  Thursday April 04 2018 00:16:07 EDT Ventricular Rate:  59 PR Interval:  134 QRS Duration: 88 QT Interval:  402 QTC Calculation: 397 R Axis:   75 Text Interpretation:  Sinus bradycardia with sinus arrhythmia Otherwise normal ECG When compared with ECG of 05/13/2017, No significant change was found Confirmed by Delora Fuel ([id number]) on 04/04/2018 1:37:55 AM   Radiology Dg Chest 2 View  Result Date: 04/04/2018 CLINICAL DATA:  Initial evaluation for acute chest pain. EXAM: CHEST - 2 VIEW COMPARISON:  Prior radiograph from 06/03/2017. FINDINGS: The cardiac and mediastinal silhouettes are stable in size and contour, and remain within normal limits. The lungs are normally inflated. Mild scattered peribronchial thickening, similar to previous. No airspace consolidation, pleural effusion, or pulmonary edema is identified. There is no pneumothorax. No acute osseous abnormality identified. IMPRESSION: 1. Mild scattered  peribronchial thickening, similar to previous, and suspected to be chronic in nature related to history of asthma. Superimposed acute bronchiolitis could be considered in the correct clinical setting. 2. No other active cardiopulmonary disease identified. Electronically Signed   By: Jeannine Boga M.D.   On: 04/04/2018 03:20    Procedures Procedures (including critical care time)  Medications Ordered in ED Medications - No data to display   Initial Impression / Assessment and Plan / ED Course  I have reviewed the triage vital signs and the nursing notes.  Pertinent labs & imaging results that were available during my care of the patient were reviewed by me and considered in my medical decision making (see chart for details).     BP 136/83 (BP Location: Right Arm)   Pulse 65   Temp 98.7 F (37.1 C) (Oral)   Resp 17   SpO2 99%    Final Clinical Impressions(s) / ED Diagnoses   Final diagnoses:  Chest wall pain    ED  Discharge Orders         Ordered    lidocaine (LIDODERM) 5 %  Every 24 hours     04/04/18 0331    cyclobenzaprine (FLEXERIL) 10 MG tablet  2 times daily PRN     04/04/18 0331         2:55 AM Patient with reproducible left upper back, chest, and shoulder pain likely musculoskeletal. Doubt ACS or PE. Chest x-ray ordered.  3:30 AM EKG and CXR unremarkable.  Pt d/c home with lidoderm patch and muscle relaxant.  Return precaution given.    Domenic Moras, PA-C 19/47/12 [id number]    Delora Fuel, MD 29/29/09 [phone]
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
## 2018-04-04 NOTE — ED Notes (Signed)
Patient is A&Ox4.  No signs of distress noted.  Please see providers complete history and physical exam.  

## 2018-04-04 NOTE — ED Notes (Signed)
Patient transported to X-ray 

## 2018-04-04 NOTE — ED Notes (Signed)
Pt. Called for room no answer.

## 2018-04-12 ENCOUNTER — Emergency Department (HOSPITAL_COMMUNITY)
Admission: EM | Admit: 2018-04-12 | Discharge: 2018-04-12 | Disposition: A | Discharge status: Home / Self Care | Payer: Medicaid Other | Attending: Emergency Medicine | Admitting: Emergency Medicine

## 2018-04-12 ENCOUNTER — Encounter (HOSPITAL_COMMUNITY): Payer: Self-pay | Admitting: Emergency Medicine

## 2018-04-12 DIAGNOSIS — F17210 Nicotine dependence, cigarettes, uncomplicated: Secondary | ICD-10-CM | POA: Diagnosis not present

## 2018-04-12 DIAGNOSIS — J45909 Unspecified asthma, uncomplicated: Secondary | ICD-10-CM | POA: Insufficient documentation

## 2018-04-12 DIAGNOSIS — J069 Acute upper respiratory infection, unspecified: Secondary | ICD-10-CM | POA: Insufficient documentation

## 2018-04-12 DIAGNOSIS — R05 Cough: Secondary | ICD-10-CM | POA: Diagnosis present

## 2018-04-12 MED ORDER — PREDNISONE 20 MG PO TABS
60.0000 mg | ORAL_TABLET | Freq: Once | ORAL | Status: AC
  Administered 2018-04-12: 60 mg via ORAL
  Filled 2018-04-12: qty 3

## 2018-04-12 MED ORDER — BENZONATATE 100 MG PO CAPS: 100.0000 mg | ORAL_CAPSULE | Freq: Three times a day (TID) | ORAL | 0 refills | Status: DC | PRN

## 2018-04-12 MED ORDER — PREDNISONE 10 MG PO TABS: 20.0000 mg | ORAL_TABLET | Freq: Every day | ORAL | 0 refills | Status: AC

## 2018-04-12 NOTE — ED Notes (Signed)
Pt. Refusing discharge vitals.

## 2018-04-12 NOTE — Discharge Instructions (Addendum)
Please read instructions below.  You can take ibuprofen every 6hours as needed for sore throat.  Starting tomorrow, take prednisone daily until gone.  Use your albuterol inhaler as needed for wheezing or SOB. Drink plenty of water.  Use saline nasal spray for congestion. Follow up with your primary care provider as needed.  Return to the ER for inability to swallow liquids, difficulty breathing, or new or worsening symptoms.

## 2018-04-12 NOTE — ED Triage Notes (Addendum)
Pt here for eval of 1 week of coughing and runny nose. Vitals stable. Lung sounds are clear.

## 2018-04-12 NOTE — ED Notes (Signed)
Patient verbalizes understanding of discharge instructions. Opportunity for questioning and answers were provided. Armband removed by staff, pt discharged from ED home via [facility].

## 2018-04-12 NOTE — ED Provider Notes (Signed)
[facility] EMERGENCY DEPARTMENT Provider Note   CSN: [id number] Arrival date & time: 04/12/18  1820     History   Chief Complaint Chief Complaint  Patient presents with  . Cough    HPI  William Gregory is a 32 y.o. male with past medical history of alcohol abuse, asthma, presenting to the emergency department with gradual onset of upper respiratory symptoms that began last week.  He states symptoms began with a scratchy throat and then he developed both nasal congestion and runny nose.  He states he has a dry nonproductive cough.  No medications tried for symptoms.  Denies fever, difficulty swallowing or breathing, or other complaints.  Has been using his albuterol inhaler slightly more than usual.  The history is provided by the patient.    Past Medical History:  Diagnosis Date  . Alcohol abuse   . Asthma   . Back pain   . Depression   . Knee joint pain, left   . Psychiatric problem    unspecified by patient (receives "injections" bi-weekly)  . Tobacco abuse     Patient Active Problem List   Diagnosis Date Noted  . AKI (acute kidney injury) ([place]) 08/14/2015  . Nausea vomiting and diarrhea 08/14/2015  . Abdominal pain 08/14/2015  . GERD (gastroesophageal reflux disease) 08/14/2015  . Acute kidney injury ([place])   . ACUTE KIDNEY FAILURE UNSPECIFIED 07/09/2009  . TOBACCO USER 06/07/2009  . KNEE PAIN, RIGHT, CHRONIC 01/15/2009  . Backache 01/15/2009  . LEG EDEMA, BILATERAL 12/15/2008  . Alcohol abuse 04/02/2008  . Depression 04/02/2008  . MYOCARDIAL INFARCTION, HX OF 04/02/2008  . Asthma 04/02/2008    No past surgical history on file.      Home Medications    Prior to Admission medications   Medication Sig Start Date End Date Taking? Authorizing Provider  albuterol (PROVENTIL HFA;VENTOLIN HFA) 108 (90 Base) MCG/ACT inhaler Inhale 1-2 puffs into the lungs every 6 (six) hours as needed for wheezing or shortness of breath.    [provider]  benzonatate (TESSALON) 100 MG capsule Take 1 capsule (100 mg total) by mouth 3 (three) times daily as needed for cough. 04/12/18   Robinson, [place] N, PA-C  citalopram (CELEXA) 20 MG tablet Take 20 mg by mouth daily.    [provider]  cyclobenzaprine (FLEXERIL) 10 MG tablet Take 1 tablet (10 mg total) by mouth 2 (two) times daily as needed for muscle spasms. 04/04/18   Domenic Moras, PA-C  lidocaine (LIDODERM) 5 % Place 1 patch onto the skin daily. Remove & Discard patch within 12 hours or as directed by MD 04/04/18   Domenic Moras, PA-C  predniSONE (DELTASONE) 10 MG tablet Take 2 tablets (20 mg total) by mouth daily for 4 days. 04/12/18 04/16/18  Robinson, [place] N, PA-C    Family History Family History  Problem Relation Age of Onset  . Diabetes Mother   . Diabetes Father     Social History Social History   Tobacco Use  . Smoking status: Current Every Day Smoker    Packs/day: 1.00    Types: Cigarettes  . Smokeless tobacco: Never Used  Substance Use Topics  . Alcohol use: Yes  . Drug use: Yes    Types: Cocaine, Marijuana     Allergies   Aspirin and Ibuprofen   Review of Systems Review of Systems  Constitutional: Negative for fever.  HENT: Positive for congestion, rhinorrhea and sore throat. Negative for ear pain, trouble swallowing  and voice change.   Respiratory: Positive for cough. Negative for shortness of breath.      Physical Exam Updated Vital Signs BP (!) 148/95   Pulse 87   Temp 98.2 F (36.8 C) (Oral)   Resp 20   SpO2 96%   Physical Exam  Constitutional: He appears well-developed and well-nourished. No distress.  HENT:  Head: Normocephalic and atraumatic.  Tolerating secretions.  Pharynx is mildly erythematous without edema or exudates.  Uvula midline, no trismus.  TMs are normal bilaterally.  Eyes: Conjunctivae are normal.  Neck: Normal range of motion. Neck supple.  Cardiovascular: Normal rate, regular rhythm and normal heart  sounds.  Pulmonary/Chest: Effort normal. No respiratory distress. He has wheezes (Very mild expiratory wheezes bilaterally.). He has no rales.  Lymphadenopathy:    He has no cervical adenopathy.  Psychiatric: He has a normal mood and affect. His behavior is normal.  Nursing note and vitals reviewed.    ED Treatments / Results  Labs (all labs ordered are listed, but only abnormal results are displayed) Labs Reviewed - No data to display  EKG None  Radiology No results found.  Procedures Procedures (including critical care time)  Medications Ordered in ED Medications  predniSONE (DELTASONE) tablet 60 mg (60 mg Oral Given 04/12/18 1930)     Initial Impression / Assessment and Plan / ED Course  I have reviewed the triage vital signs and the nursing notes.  Pertinent labs & imaging results that were available during my care of the patient were reviewed by me and considered in my medical decision making (see chart for details).     Patients symptoms are consistent with URI, likely viral etiology. Afebrile, tolerating secretions.  Lungs with mild wheezes bilaterally, normal work of breathing. Discussed that antibiotics are not indicated for viral infections. Pt will be discharged with symptomatic treatment, prednisone and Tessalon for asthma exacerbation, likely secondary to viral illness.  Verbalizes understanding and is agreeable with plan. Pt is hemodynamically stable & in NAD prior to dc.  Discussed results, findings, treatment and follow up. Patient advised of return precautions. Patient verbalized understanding and agreed with plan.   Final Clinical Impressions(s) / ED Diagnoses   Final diagnoses:  Viral URI    ED Discharge Orders         Ordered    benzonatate (TESSALON) 100 MG capsule  3 times daily PRN     04/12/18 1925    predniSONE (DELTASONE) 10 MG tablet  Daily     04/12/18 1925           Robinson, [place] N, PA-C 04/12/18 2110    Valarie Merino,  MD 04/12/18 [phone]

## 2018-04-12 NOTE — ED Notes (Signed)
Pt was rude and cussing this RN out because he needs to catch a bus.

## 2018-04-20 DIAGNOSIS — I252 Old myocardial infarction: Secondary | ICD-10-CM | POA: Diagnosis not present

## 2018-04-20 DIAGNOSIS — F1210 Cannabis abuse, uncomplicated: Secondary | ICD-10-CM | POA: Insufficient documentation

## 2018-04-20 DIAGNOSIS — R0789 Other chest pain: Secondary | ICD-10-CM | POA: Insufficient documentation

## 2018-04-20 DIAGNOSIS — F17210 Nicotine dependence, cigarettes, uncomplicated: Secondary | ICD-10-CM | POA: Diagnosis not present

## 2018-04-20 DIAGNOSIS — Z79899 Other long term (current) drug therapy: Secondary | ICD-10-CM | POA: Insufficient documentation

## 2018-04-20 DIAGNOSIS — J40 Bronchitis, not specified as acute or chronic: Secondary | ICD-10-CM | POA: Diagnosis not present

## 2018-04-20 DIAGNOSIS — F1410 Cocaine abuse, uncomplicated: Secondary | ICD-10-CM | POA: Insufficient documentation

## 2018-04-20 DIAGNOSIS — R05 Cough: Secondary | ICD-10-CM | POA: Diagnosis present

## 2018-04-20 NOTE — ED Triage Notes (Signed)
Pt presents by [facility] for evaluation of cough and pain in chest when coughing. Pt reports pain in ribs that is worse with cough. Pt also reports to drinking 2-3 40's a day.

## 2018-04-21 ENCOUNTER — Encounter (HOSPITAL_COMMUNITY): Payer: Self-pay | Admitting: Emergency Medicine

## 2018-04-21 ENCOUNTER — Encounter (HOSPITAL_COMMUNITY): Payer: Self-pay

## 2018-04-21 ENCOUNTER — Emergency Department (HOSPITAL_COMMUNITY): Payer: Medicaid Other

## 2018-04-21 ENCOUNTER — Emergency Department (HOSPITAL_COMMUNITY)
Admission: EM | Admit: 2018-04-21 | Discharge: 2018-04-21 | Disposition: A | Discharge status: Home / Self Care | Payer: Medicaid Other | Attending: Emergency Medicine | Admitting: Emergency Medicine

## 2018-04-21 ENCOUNTER — Other Ambulatory Visit: Payer: Self-pay

## 2018-04-21 ENCOUNTER — Emergency Department (HOSPITAL_COMMUNITY)
Admission: EM | Admit: 2018-04-21 | Discharge: 2018-04-21 | Disposition: A | Discharge status: Home / Self Care | Payer: Medicaid Other | Source: Home / Self Care | Attending: Emergency Medicine | Admitting: Emergency Medicine

## 2018-04-21 DIAGNOSIS — R05 Cough: Secondary | ICD-10-CM

## 2018-04-21 DIAGNOSIS — J209 Acute bronchitis, unspecified: Secondary | ICD-10-CM

## 2018-04-21 DIAGNOSIS — R059 Cough, unspecified: Secondary | ICD-10-CM

## 2018-04-21 DIAGNOSIS — F17210 Nicotine dependence, cigarettes, uncomplicated: Secondary | ICD-10-CM | POA: Insufficient documentation

## 2018-04-21 DIAGNOSIS — Z79899 Other long term (current) drug therapy: Secondary | ICD-10-CM

## 2018-04-21 DIAGNOSIS — J40 Bronchitis, not specified as acute or chronic: Secondary | ICD-10-CM

## 2018-04-21 DIAGNOSIS — J45909 Unspecified asthma, uncomplicated: Secondary | ICD-10-CM | POA: Insufficient documentation

## 2018-04-21 LAB — CBC WITH DIFFERENTIAL/PLATELET
Abs Immature Granulocytes: 0.02 10*3/uL (ref 0.00–0.07)
Basophils Absolute: 0.1 10*3/uL (ref 0.0–0.1)
Basophils Relative: 1 %
EOS ABS: 0.1 10*3/uL (ref 0.0–0.5)
Eosinophils Relative: 2 %
HCT: 46.1 % (ref 39.0–52.0)
HEMOGLOBIN: 15.4 g/dL (ref 13.0–17.0)
IMMATURE GRANULOCYTES: 0 %
LYMPHS ABS: 3.1 10*3/uL (ref 0.7–4.0)
LYMPHS PCT: 34 %
MCH: 30.1 pg (ref 26.0–34.0)
MCHC: 33.4 g/dL (ref 30.0–36.0)
MCV: 90 fL (ref 80.0–100.0)
MONO ABS: 0.7 10*3/uL (ref 0.1–1.0)
MONOS PCT: 8 %
NRBC: 0 % (ref 0.0–0.2)
Neutro Abs: 5.1 10*3/uL (ref 1.7–7.7)
Neutrophils Relative %: 55 %
Platelets: 251 10*3/uL (ref 150–400)
RBC: 5.12 MIL/uL (ref 4.22–5.81)
RDW: 13.1 % (ref 11.5–15.5)
WBC: 9.1 10*3/uL (ref 4.0–10.5)

## 2018-04-21 LAB — BASIC METABOLIC PANEL
Anion gap: 8 (ref 5–15)
BUN: 28 mg/dL — ABNORMAL HIGH (ref 6–20)
CHLORIDE: 105 mmol/L (ref 98–111)
CO2: 22 mmol/L (ref 22–32)
Calcium: 9.1 mg/dL (ref 8.9–10.3)
Creatinine, Ser: 2.96 mg/dL — ABNORMAL HIGH (ref 0.61–1.24)
GFR calc non Af Amer: 26 mL/min — ABNORMAL LOW (ref >60)
GFR, EST AFRICAN AMERICAN: 30 mL/min — AB (ref >60)
Glucose, Bld: 87 mg/dL (ref 70–99)
POTASSIUM: 3.8 mmol/L (ref 3.5–5.1)
SODIUM: 135 mmol/L (ref 135–145)

## 2018-04-21 MED ORDER — ACETAMINOPHEN 500 MG PO TABS
1000.0000 mg | ORAL_TABLET | Freq: Once | ORAL | Status: AC
  Administered 2018-04-21: 1000 mg via ORAL
  Filled 2018-04-21: qty 2

## 2018-04-21 MED ORDER — ALBUTEROL SULFATE HFA 108 (90 BASE) MCG/ACT IN AERS: 1.0000 | INHALATION_SPRAY | Freq: Four times a day (QID) | RESPIRATORY_TRACT | 0 refills | Status: DC | PRN

## 2018-04-21 NOTE — ED Provider Notes (Signed)
[facility] DEPT Provider Note  CSN: [id number] Arrival date & time: 04/20/18 2334  Chief Complaint(s) Cough  HPI William Gregory is a 32 y.o. male with a history of asthma who presents to the emergency department with several days of productive cough of yellow sputum with associated posttussive chest wall pain.  No substernal chest pain or pain without coughing.  No shortness of breath.  He denies any fevers or chills.  Was seen last week for similar symptoms and diagnosed with a viral upper respiratory infection.  HPI  Past Medical History Past Medical History:  Diagnosis Date  . Alcohol abuse   . Asthma   . Back pain   . Depression   . Knee joint pain, left   . Psychiatric problem    unspecified by patient (receives "injections" bi-weekly)  . Tobacco abuse    Patient Active Problem List   Diagnosis Date Noted  . AKI (acute kidney injury) ([place]) 08/14/2015  . Nausea vomiting and diarrhea 08/14/2015  . Abdominal pain 08/14/2015  . GERD (gastroesophageal reflux disease) 08/14/2015  . Acute kidney injury ([place])   . ACUTE KIDNEY FAILURE UNSPECIFIED 07/09/2009  . TOBACCO USER 06/07/2009  . KNEE PAIN, RIGHT, CHRONIC 01/15/2009  . Backache 01/15/2009  . LEG EDEMA, BILATERAL 12/15/2008  . Alcohol abuse 04/02/2008  . Depression 04/02/2008  . MYOCARDIAL INFARCTION, HX OF 04/02/2008  . Asthma 04/02/2008   Home Medication(s) Prior to Admission medications   Medication Sig Start Date End Date Taking? Authorizing Provider  albuterol (PROVENTIL HFA;VENTOLIN HFA) 108 (90 Base) MCG/ACT inhaler Inhale 1-2 puffs into the lungs every 6 (six) hours as needed for wheezing or shortness of breath. 04/21/18   CardamaGrayce Sessions, MD  benzonatate (TESSALON) 100 MG capsule Take 1 capsule (100 mg total) by mouth 3 (three) times daily as needed for cough. 04/12/18   Robinson, [place] N, PA-C  citalopram (CELEXA) 20 MG tablet Take 20 mg by mouth daily.     [provider]  cyclobenzaprine (FLEXERIL) 10 MG tablet Take 1 tablet (10 mg total) by mouth 2 (two) times daily as needed for muscle spasms. 04/04/18   Domenic Moras, PA-C  lidocaine (LIDODERM) 5 % Place 1 patch onto the skin daily. Remove & Discard patch within 12 hours or as directed by MD 04/04/18   Domenic Moras, PA-C                                                                                                                                    Past Surgical History History reviewed. No pertinent surgical history. Family History Family History  Problem Relation Age of Onset  . Diabetes Mother   . Diabetes Father     Social History Social History   Tobacco Use  . Smoking status: Current Every Day Smoker    Packs/day: 1.00    Types: Cigarettes  . Smokeless tobacco: Never Used  Substance  Use Topics  . Alcohol use: Yes    Comment: 2-3 40 oz beers daily  . Drug use: Yes    Types: Cocaine, Marijuana   Allergies Aspirin and Ibuprofen  Review of Systems Review of Systems All other systems are reviewed and are negative for acute change except as noted in the HPI  Physical Exam Vital Signs  I have reviewed the triage vital signs BP (!) 133/96 (BP Location: Left Arm)   Pulse 74   Temp 98.8 F (37.1 C) (Oral)   Resp 16   Ht 5\' 6"  (1.676 m)   Wt 98.4 kg   SpO2 99%   BMI 35.02 kg/m   Physical Exam  Constitutional: He is oriented to person, place, and time. He appears well-developed and well-nourished. No distress.  HENT:  Head: Normocephalic and atraumatic.  Nose: Nose normal.  Eyes: Pupils are equal, round, and reactive to light. Conjunctivae and EOM are normal. Right eye exhibits no discharge. Left eye exhibits no discharge. No scleral icterus.  Neck: Normal range of motion. Neck supple.  Cardiovascular: Normal rate and regular rhythm. Exam reveals no gallop and no friction rub.  No murmur heard. Pulmonary/Chest: Effort normal and breath sounds normal. No  stridor. No respiratory distress. He has no wheezes. He has no rhonchi. He has no rales.  Abdominal: Soft. He exhibits no distension. There is no tenderness.  Musculoskeletal: He exhibits no edema or tenderness.  Neurological: He is alert and oriented to person, place, and time.  Skin: Skin is warm and dry. No rash noted. He is not diaphoretic. No erythema.  Psychiatric: He has a normal mood and affect.  Vitals reviewed.   ED Results and Treatments Labs (all labs ordered are listed, but only abnormal results are displayed) Labs Reviewed - No data to display                                                                                                                       EKG  EKG Interpretation  Date/Time:    Ventricular Rate:    PR Interval:    QRS Duration:   QT Interval:    QTC Calculation:   R Axis:     Text Interpretation:        Radiology Dg Chest 2 View  Result Date: 04/21/2018 CLINICAL DATA:  Acute onset of cough and rib pain when coughing. Initial encounter. EXAM: CHEST - 2 VIEW COMPARISON:  Chest radiograph performed 04/04/2018 FINDINGS: The lungs are well-aerated and clear. There is no evidence of focal opacification, pleural effusion or pneumothorax. The heart is normal in size; the mediastinal contour is within normal limits. No acute osseous abnormalities are seen. IMPRESSION: No acute cardiopulmonary process seen. No displaced rib fractures identified. Electronically Signed   By: Garald Balding M.D.   On: 04/21/2018 05:47   Pertinent labs & imaging results that were available during my care of the patient were reviewed by me and considered in my medical decision making (see chart  for details).  Medications Ordered in ED Medications  acetaminophen (TYLENOL) tablet 1,000 mg (1,000 mg Oral Given 04/21/18 0606)                                                                                                                                     Procedures Procedures  (including critical care time)  Medical Decision Making / ED Course I have reviewed the nursing notes for this encounter and the patient's prior records (if available in EHR or on provided paperwork).    Chest x-ray without evidence of pneumonia.  Consistent with bronchitis.  Patient has home inhalers which is running out.  We will give him prescription for additional.  Chest pain is musculoskeletal in nature.  Doubt cardiac etiology.  Doubt PE.  The patient appears reasonably screened and/or stabilized for discharge and I doubt any other medical condition or other [facility] requiring further screening, evaluation, or treatment in the ED at this time prior to discharge.  The patient is safe for discharge with strict return precautions.   Final Clinical Impression(s) / ED Diagnoses Final diagnoses:  Cough  Bronchitis    Disposition: Discharge  Condition: Good  I have discussed the results, Dx and Tx plan with the patient who expressed understanding and agree(s) with the plan. Discharge instructions discussed at great length. The patient was given strict return precautions who verbalized understanding of the instructions. No further questions at time of discharge.    ED Discharge Orders         Ordered    albuterol (PROVENTIL HFA;VENTOLIN HFA) 108 (90 Base) MCG/ACT inhaler  Every 6 hours PRN     04/21/18 0556           This chart was dictated using voice recognition software.  Despite best efforts to proofread,  errors can occur which can change the documentation meaning.   Fatima Blank, MD 04/21/18 [phone]

## 2018-04-21 NOTE — ED Notes (Signed)
Patient transported to X-ray 
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
## 2018-04-21 NOTE — ED Triage Notes (Signed)
Pt arrives Walking with c/o chest pain and right knee pain x 1 day. Pt states he thinks he is walking too much.

## 2018-04-21 NOTE — ED Notes (Signed)
Called for room x1.no answer. 

## 2018-04-23 ENCOUNTER — Encounter (HOSPITAL_COMMUNITY): Payer: Self-pay | Admitting: Emergency Medicine

## 2018-04-23 ENCOUNTER — Other Ambulatory Visit: Payer: Self-pay

## 2018-04-23 ENCOUNTER — Emergency Department (HOSPITAL_COMMUNITY)
Admission: EM | Admit: 2018-04-23 | Discharge: 2018-04-23 | Disposition: A | Discharge status: Home / Self Care | Payer: Medicaid Other | Attending: Emergency Medicine | Admitting: Emergency Medicine

## 2018-04-23 DIAGNOSIS — R110 Nausea: Secondary | ICD-10-CM | POA: Insufficient documentation

## 2018-04-23 DIAGNOSIS — Z5321 Procedure and treatment not carried out due to patient leaving prior to being seen by health care provider: Secondary | ICD-10-CM | POA: Diagnosis not present

## 2018-04-23 LAB — COMPREHENSIVE METABOLIC PANEL
ALT: 16 U/L (ref 0–44)
AST: 25 U/L (ref 15–41)
Albumin: 3.8 g/dL (ref 3.5–5.0)
Alkaline Phosphatase: 59 U/L (ref 38–126)
Anion gap: 7 (ref 5–15)
BUN: 25 mg/dL — AB (ref 6–20)
CHLORIDE: 105 mmol/L (ref 98–111)
CO2: 28 mmol/L (ref 22–32)
CREATININE: 3.06 mg/dL — AB (ref 0.61–1.24)
Calcium: 9.4 mg/dL (ref 8.9–10.3)
GFR calc Af Amer: 29 mL/min — ABNORMAL LOW (ref >60)
GFR calc non Af Amer: 25 mL/min — ABNORMAL LOW (ref >60)
GLUCOSE: 91 mg/dL (ref 70–99)
Potassium: 4.3 mmol/L (ref 3.5–5.1)
SODIUM: 140 mmol/L (ref 135–145)
Total Bilirubin: 0.9 mg/dL (ref 0.3–1.2)
Total Protein: 7.3 g/dL (ref 6.5–8.1)

## 2018-04-23 LAB — CBC
HEMATOCRIT: 46 % (ref 39.0–52.0)
Hemoglobin: 15.2 g/dL (ref 13.0–17.0)
MCH: 30.9 pg (ref 26.0–34.0)
MCHC: 33 g/dL (ref 30.0–36.0)
MCV: 93.5 fL (ref 80.0–100.0)
PLATELETS: 239 10*3/uL (ref 150–400)
RBC: 4.92 MIL/uL (ref 4.22–5.81)
RDW: 13.3 % (ref 11.5–15.5)
WBC: 7.6 10*3/uL (ref 4.0–10.5)
nRBC: 0 % (ref 0.0–0.2)

## 2018-04-23 LAB — LIPASE, BLOOD: LIPASE: 47 U/L (ref 11–51)

## 2018-04-23 NOTE — ED Triage Notes (Signed)
Pt reporting nausea that has been going on for half of the night. Pt has hx of heavy alcohol use.

## 2018-04-23 NOTE — ED Notes (Signed)
PT have been made aware of urine sample 

## 2018-04-23 NOTE — ED Notes (Signed)
First attempt PT  not in lobby

## 2018-04-30 ENCOUNTER — Encounter (HOSPITAL_COMMUNITY): Payer: Self-pay | Admitting: Emergency Medicine

## 2018-04-30 ENCOUNTER — Other Ambulatory Visit: Payer: Self-pay

## 2018-04-30 ENCOUNTER — Emergency Department (HOSPITAL_COMMUNITY)
Admission: EM | Admit: 2018-04-30 | Discharge: 2018-04-30 | Disposition: A | Discharge status: Home / Self Care | Payer: Medicaid Other | Attending: Emergency Medicine | Admitting: Emergency Medicine

## 2018-04-30 DIAGNOSIS — Z79899 Other long term (current) drug therapy: Secondary | ICD-10-CM | POA: Diagnosis not present

## 2018-04-30 DIAGNOSIS — I252 Old myocardial infarction: Secondary | ICD-10-CM | POA: Diagnosis not present

## 2018-04-30 DIAGNOSIS — F17210 Nicotine dependence, cigarettes, uncomplicated: Secondary | ICD-10-CM | POA: Diagnosis not present

## 2018-04-30 DIAGNOSIS — J45909 Unspecified asthma, uncomplicated: Secondary | ICD-10-CM | POA: Insufficient documentation

## 2018-04-30 DIAGNOSIS — R1013 Epigastric pain: Secondary | ICD-10-CM | POA: Diagnosis not present

## 2018-04-30 LAB — COMPREHENSIVE METABOLIC PANEL
ALT: 13 U/L (ref 0–44)
AST: 26 U/L (ref 15–41)
Albumin: 3.6 g/dL (ref 3.5–5.0)
Alkaline Phosphatase: 58 U/L (ref 38–126)
Anion gap: 10 (ref 5–15)
BUN: 29 mg/dL — AB (ref 6–20)
CALCIUM: 8.9 mg/dL (ref 8.9–10.3)
CO2: 23 mmol/L (ref 22–32)
Chloride: 108 mmol/L (ref 98–111)
Creatinine, Ser: 2.9 mg/dL — ABNORMAL HIGH (ref 0.61–1.24)
GFR, EST AFRICAN AMERICAN: 31 mL/min — AB (ref >60)
GFR, EST NON AFRICAN AMERICAN: 27 mL/min — AB (ref >60)
Glucose, Bld: 106 mg/dL — ABNORMAL HIGH (ref 70–99)
Potassium: 3.8 mmol/L (ref 3.5–5.1)
SODIUM: 141 mmol/L (ref 135–145)
Total Bilirubin: 0.9 mg/dL (ref 0.3–1.2)
Total Protein: 7.2 g/dL (ref 6.5–8.1)

## 2018-04-30 LAB — CBC
HCT: 45.8 % (ref 39.0–52.0)
Hemoglobin: 15.1 g/dL (ref 13.0–17.0)
MCH: 30.4 pg (ref 26.0–34.0)
MCHC: 33 g/dL (ref 30.0–36.0)
MCV: 92.3 fL (ref 80.0–100.0)
NRBC: 0 % (ref 0.0–0.2)
PLATELETS: 245 10*3/uL (ref 150–400)
RBC: 4.96 MIL/uL (ref 4.22–5.81)
RDW: 13.2 % (ref 11.5–15.5)
WBC: 8 10*3/uL (ref 4.0–10.5)

## 2018-04-30 LAB — LIPASE, BLOOD: Lipase: 45 U/L (ref 11–51)

## 2018-04-30 MED ORDER — ONDANSETRON 4 MG PO TBDP: 4.0000 mg | ORAL_TABLET | Freq: Once | ORAL | Status: DC

## 2018-04-30 MED ORDER — ALUM & MAG HYDROXIDE-SIMETH 200-200-20 MG/5ML PO SUSP
30.0000 mL | Freq: Once | ORAL | Status: AC
  Administered 2018-04-30: 30 mL via ORAL
  Filled 2018-04-30: qty 30

## 2018-04-30 MED ORDER — SUCRALFATE 1 G PO TABS: 1.0000 g | ORAL_TABLET | Freq: Three times a day (TID) | ORAL | 0 refills | Status: DC

## 2018-04-30 MED ORDER — OMEPRAZOLE 20 MG PO CPDR: 20.0000 mg | DELAYED_RELEASE_CAPSULE | Freq: Two times a day (BID) | ORAL | 0 refills | Status: DC

## 2018-04-30 MED ORDER — ONDANSETRON 4 MG PO TBDP: 4.0000 mg | ORAL_TABLET | Freq: Three times a day (TID) | ORAL | 0 refills | Status: DC | PRN

## 2018-04-30 MED ORDER — SUCRALFATE 1 G PO TABS
1.0000 g | ORAL_TABLET | Freq: Once | ORAL | Status: AC
  Administered 2018-04-30: 1 g via ORAL
  Filled 2018-04-30: qty 1

## 2018-04-30 MED ORDER — ONDANSETRON 4 MG PO TBDP: 4.0000 mg | ORAL_TABLET | Freq: Once | ORAL | Status: DC | PRN

## 2018-04-30 MED ORDER — LIDOCAINE VISCOUS HCL 2 % MT SOLN
15.0000 mL | Freq: Once | OROMUCOSAL | Status: AC
  Administered 2018-04-30: 15 mL via ORAL
  Filled 2018-04-30: qty 15

## 2018-04-30 NOTE — Discharge Instructions (Signed)
Take the prescribed medication as directed.  Avoid alcohol, NSAIDs (motrin, aleve, aspirin, etc), spicy or acidic foods. Follow-up with your primary care doctor Return to the ED for new or worsening symptoms.

## 2018-04-30 NOTE — ED Provider Notes (Signed)
[facility] DEPT Provider Note   CSN: [id number] Arrival date & time: 04/30/18  0045     History   Chief Complaint Chief Complaint  Patient presents with  . Abdominal Pain    HPI William Gregory is a 32 y.o. male.  The history is provided by the patient and medical records.  Abdominal Pain   Associated symptoms include nausea.    32 y.o. M with hx of alcohol abuse, asthma, depression, tobacco abuse, presenting to the ED for abdominal pain.  Patient reports it started about 3 hours PTA.  He states its a sharp pain "at the top of my belly".  He reports feeling gassy and nauseated but no vomiting.  He states after he ate a microwaved spicy chicken patty and took some aspirin it got worse.  States whenever he takes ASA it tends to "upset his stomach".  When asked why he took it he stated "I don't know".  Denies hx of PUD.  He does have hx of alcohol abuse, states no use since this past weekend.  Past Medical History:  Diagnosis Date  . Alcohol abuse   . Asthma   . Back pain   . Depression   . Knee joint pain, left   . Psychiatric problem    unspecified by patient (receives "injections" bi-weekly)  . Tobacco abuse     Patient Active Problem List   Diagnosis Date Noted  . AKI (acute kidney injury) ([place]) 08/14/2015  . Nausea vomiting and diarrhea 08/14/2015  . Abdominal pain 08/14/2015  . GERD (gastroesophageal reflux disease) 08/14/2015  . Acute kidney injury ([place])   . ACUTE KIDNEY FAILURE UNSPECIFIED 07/09/2009  . TOBACCO USER 06/07/2009  . KNEE PAIN, RIGHT, CHRONIC 01/15/2009  . Backache 01/15/2009  . LEG EDEMA, BILATERAL 12/15/2008  . Alcohol abuse 04/02/2008  . Depression 04/02/2008  . MYOCARDIAL INFARCTION, HX OF 04/02/2008  . Asthma 04/02/2008    History reviewed. No pertinent surgical history.      Home Medications    Prior to Admission medications   Medication Sig Start Date End Date Taking? Authorizing Provider    albuterol (PROVENTIL HFA;VENTOLIN HFA) 108 (90 Base) MCG/ACT inhaler Inhale 1-2 puffs into the lungs every 6 (six) hours as needed for wheezing or shortness of breath. 04/21/18   CardamaGrayce Sessions, MD  benzonatate (TESSALON) 100 MG capsule Take 1 capsule (100 mg total) by mouth 3 (three) times daily as needed for cough. Patient not taking: Reported on 04/21/2018 04/12/18   Robinson, [place] N, PA-C  citalopram (CELEXA) 20 MG tablet Take 20 mg by mouth daily.    [provider]  cyclobenzaprine (FLEXERIL) 10 MG tablet Take 1 tablet (10 mg total) by mouth 2 (two) times daily as needed for muscle spasms. Patient not taking: Reported on 04/21/2018 04/04/18   Domenic Moras, PA-C  lidocaine (LIDODERM) 5 % Place 1 patch onto the skin daily. Remove & Discard patch within 12 hours or as directed by MD Patient not taking: Reported on 04/21/2018 04/04/18   Domenic Moras, PA-C    Family History Family History  Problem Relation Age of Onset  . Diabetes Mother   . Diabetes Father     Social History Social History   Tobacco Use  . Smoking status: Current Every Day Smoker    Packs/day: 1.00    Types: Cigarettes  . Smokeless tobacco: Never Used  Substance Use Topics  . Alcohol use: Yes    Comment: 2-3 40 oz beers  daily  . Drug use: Yes    Types: Cocaine, Marijuana     Allergies   Aspirin and Ibuprofen   Review of Systems Review of Systems  Gastrointestinal: Positive for abdominal pain and nausea.  All other systems reviewed and are negative.    Physical Exam Updated Vital Signs BP 130/84 (BP Location: Left Arm)   Pulse 60   Temp 98.2 F (36.8 C) (Oral)   Resp 17   Ht 5\' 6"  (1.676 m)   Wt 93.4 kg   SpO2 98%   BMI 33.25 kg/m   Physical Exam  Constitutional: He is oriented to person, place, and time. He appears well-developed and well-nourished.  Sleeping, had to be awoken for exam, continues putting blanket back over his head during exam  HENT:  Head: Normocephalic  and atraumatic.  Mouth/Throat: Oropharynx is clear and moist.  Eyes: Pupils are equal, round, and reactive to light. Conjunctivae and EOM are normal.  Neck: Normal range of motion.  Cardiovascular: Normal rate, regular rhythm and normal heart sounds.  Pulmonary/Chest: Effort normal and breath sounds normal. No stridor. No respiratory distress.  Abdominal: Soft. Bowel sounds are normal. There is tenderness in the epigastric area. There is negative Murphy's sign.  Very mild tenderness of epigastrium, no rebound or guarding, no peritoneal signs  Musculoskeletal: Normal range of motion.  Neurological: He is alert and oriented to person, place, and time.  Skin: Skin is warm and dry.  Psychiatric: He has a normal mood and affect.  Nursing note and vitals reviewed.    ED Treatments / Results  Labs (all labs ordered are listed, but only abnormal results are displayed) Labs Reviewed  COMPREHENSIVE METABOLIC PANEL - Abnormal; Notable for the following components:      Result Value   Glucose, Bld 106 (*)    BUN 29 (*)    Creatinine, Ser 2.90 (*)    GFR calc non Af Amer 27 (*)    GFR calc Af Amer 31 (*)    All other components within normal limits  LIPASE, BLOOD  CBC  URINALYSIS, ROUTINE W REFLEX MICROSCOPIC    EKG None  Radiology No results found.  Procedures Procedures (including critical care time)  Medications Ordered in ED Medications  ondansetron (ZOFRAN-ODT) disintegrating tablet 4 mg (has no administration in time range)  alum & mag hydroxide-simeth (MAALOX/MYLANTA) 200-200-20 MG/5ML suspension 30 mL (30 mLs Oral Given 04/30/18 0407)    And  lidocaine (XYLOCAINE) 2 % viscous mouth solution 15 mL (15 mLs Oral Given 04/30/18 0407)  sucralfate (CARAFATE) tablet 1 g (1 g Oral Given 04/30/18 0407)     Initial Impression / Assessment and Plan / ED Course  I have reviewed the triage vital signs and the nursing notes.  Pertinent labs & imaging results that were available  during my care of the patient were reviewed by me and considered in my medical decision making (see chart for details).  32 y.o. M here with epigastric pain.  Initially sleeping, had to be awoken for exam.  States he feels "gassy" and bloated.  Mild tenderness in the epigastrium without rebound or guarding.  No peritoneal signs.  Labs reassuring.  Patient does report pain got acutely worse after eating a microwaved spicy chicken patty and taking some ASA.  Also reported heavy EtOH use over the past weekend.  Suspect he has gastritis-- likely exacerbated by ASA he took earlier which he states "messes up his stomach".  Will treat symptomatically.  5:07 AM Patient  has been sleeping throughout ED visit.  He is in no apparent distress.  Vitals stable. Will start on prilosec, carafate.  Encouraged alcohol cessation, avoid NSAIDs as well as spicy/acidic foods.  Close follow-up with PCP.  Return here for any new/acute changes.  Final Clinical Impressions(s) / ED Diagnoses   Final diagnoses:  Epigastric pain    ED Discharge Orders         Ordered    sucralfate (CARAFATE) 1 g tablet  3 times daily with meals & bedtime     04/30/18 0512    ondansetron (ZOFRAN ODT) 4 MG disintegrating tablet  Every 8 hours PRN     04/30/18 0512    omeprazole (PRILOSEC) 20 MG capsule  2 times daily before meals     04/30/18 0512           Larene Pickett, PA-C 04/30/18 [id number]    Merryl Hacker, MD 04/30/18 [phone]

## 2018-04-30 NOTE — ED Triage Notes (Signed)
Pt presents by [facility] for nausea/abd pain that has been ongoing for the last 3 hours. Pt denies any vomiting or diarrhea.

## 2018-05-14 ENCOUNTER — Emergency Department (HOSPITAL_COMMUNITY): Payer: Medicaid Other

## 2018-05-14 ENCOUNTER — Emergency Department (HOSPITAL_COMMUNITY)
Admission: EM | Admit: 2018-05-14 | Discharge: 2018-05-14 | Discharge status: Against Medical Advice | Payer: Medicaid Other | Attending: Emergency Medicine | Admitting: Emergency Medicine

## 2018-05-14 ENCOUNTER — Encounter (HOSPITAL_COMMUNITY): Payer: Self-pay | Admitting: Emergency Medicine

## 2018-05-14 DIAGNOSIS — S0990XA Unspecified injury of head, initial encounter: Secondary | ICD-10-CM | POA: Insufficient documentation

## 2018-05-14 DIAGNOSIS — M25532 Pain in left wrist: Secondary | ICD-10-CM | POA: Diagnosis not present

## 2018-05-14 DIAGNOSIS — F17210 Nicotine dependence, cigarettes, uncomplicated: Secondary | ICD-10-CM | POA: Diagnosis not present

## 2018-05-14 DIAGNOSIS — Y999 Unspecified external cause status: Secondary | ICD-10-CM | POA: Insufficient documentation

## 2018-05-14 DIAGNOSIS — Y929 Unspecified place or not applicable: Secondary | ICD-10-CM | POA: Insufficient documentation

## 2018-05-14 DIAGNOSIS — J45909 Unspecified asthma, uncomplicated: Secondary | ICD-10-CM | POA: Diagnosis not present

## 2018-05-14 DIAGNOSIS — Y939 Activity, unspecified: Secondary | ICD-10-CM | POA: Diagnosis not present

## 2018-05-14 DIAGNOSIS — Z79899 Other long term (current) drug therapy: Secondary | ICD-10-CM | POA: Diagnosis not present

## 2018-05-14 NOTE — ED Notes (Signed)
Patient left without notifying staff. Unable to locate patient in ED.
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
## 2018-05-22 ENCOUNTER — Other Ambulatory Visit: Payer: Self-pay

## 2018-05-22 ENCOUNTER — Encounter (HOSPITAL_COMMUNITY): Payer: Self-pay

## 2018-05-22 ENCOUNTER — Emergency Department (HOSPITAL_COMMUNITY)
Admission: EM | Admit: 2018-05-22 | Discharge: 2018-05-22 | Disposition: A | Discharge status: Home / Self Care | Payer: Medicaid Other | Attending: Emergency Medicine | Admitting: Emergency Medicine

## 2018-05-22 DIAGNOSIS — K029 Dental caries, unspecified: Secondary | ICD-10-CM | POA: Diagnosis not present

## 2018-05-22 DIAGNOSIS — J45909 Unspecified asthma, uncomplicated: Secondary | ICD-10-CM | POA: Diagnosis not present

## 2018-05-22 DIAGNOSIS — M278 Other specified diseases of jaws: Secondary | ICD-10-CM

## 2018-05-22 DIAGNOSIS — K0889 Other specified disorders of teeth and supporting structures: Secondary | ICD-10-CM

## 2018-05-22 DIAGNOSIS — F17210 Nicotine dependence, cigarettes, uncomplicated: Secondary | ICD-10-CM | POA: Insufficient documentation

## 2018-05-22 DIAGNOSIS — R220 Localized swelling, mass and lump, head: Secondary | ICD-10-CM | POA: Diagnosis not present

## 2018-05-22 MED ORDER — PENICILLIN V POTASSIUM 500 MG PO TABS: 500.0000 mg | ORAL_TABLET | Freq: Four times a day (QID) | ORAL | 0 refills | Status: AC

## 2018-05-22 MED ORDER — AMOXICILLIN 500 MG PO CAPS: 500.0000 mg | ORAL_CAPSULE | Freq: Once | ORAL | Status: DC

## 2018-05-22 MED ORDER — PENICILLIN V POTASSIUM 500 MG PO TABS
500.0000 mg | ORAL_TABLET | Freq: Once | ORAL | Status: AC
  Administered 2018-05-22: 500 mg via ORAL
  Filled 2018-05-22: qty 1
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
## 2018-05-22 NOTE — Discharge Instructions (Addendum)
Thank you for allowing me to care for you today in the Emergency Department.   The average emergency department visit takes 3 to 4 hours.  Please plan appropriately if you come before you have to go to work.  Your first dose of penicillin has been given in the ER.  For the next 5 days, take 1 tablet of penicillin every 6 hours.  Swish and spit warm salt water every 6 hours to help with pain and to reduce your risk of infection.  Make sure that you do not swallow the salt water.  You can take 650 mg of Tylenol every 6 hours for pain control.     Use the attached dental resource guide to get established with a dentist.  It is very important you follow-up with a dentist.  Return to the emergency department if you develop a high fever or chills, if you start to have thick, mucus-like drainage from around your teeth, significant swelling to one side of your neck, feelings of your throat is closing, or other new, concerning symptoms.
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
## 2019-12-15 ENCOUNTER — Ambulatory Visit (HOSPITAL_COMMUNITY)
Admission: EM | Admit: 2019-12-15 | Discharge: 2019-12-15 | Disposition: A | Discharge status: Home / Self Care | Payer: Medicaid Other | Attending: Family Medicine | Admitting: Family Medicine

## 2019-12-15 ENCOUNTER — Encounter (HOSPITAL_COMMUNITY): Payer: Self-pay

## 2019-12-15 ENCOUNTER — Other Ambulatory Visit: Payer: Self-pay

## 2019-12-15 DIAGNOSIS — K0889 Other specified disorders of teeth and supporting structures: Secondary | ICD-10-CM | POA: Insufficient documentation

## 2019-12-15 DIAGNOSIS — R030 Elevated blood-pressure reading, without diagnosis of hypertension: Secondary | ICD-10-CM | POA: Insufficient documentation

## 2019-12-15 DIAGNOSIS — J4521 Mild intermittent asthma with (acute) exacerbation: Secondary | ICD-10-CM

## 2019-12-15 DIAGNOSIS — Z20822 Contact with and (suspected) exposure to covid-19: Secondary | ICD-10-CM | POA: Diagnosis not present

## 2019-12-15 DIAGNOSIS — J029 Acute pharyngitis, unspecified: Secondary | ICD-10-CM | POA: Diagnosis present

## 2019-12-15 DIAGNOSIS — J45909 Unspecified asthma, uncomplicated: Secondary | ICD-10-CM

## 2019-12-15 LAB — SARS CORONAVIRUS 2 (TAT 6-24 HRS): SARS Coronavirus 2: NEGATIVE

## 2019-12-15 LAB — POCT RAPID STREP A: Streptococcus, Group A Screen (Direct): NEGATIVE

## 2019-12-15 MED ORDER — AMOXICILLIN 875 MG PO TABS: 875.0000 mg | ORAL_TABLET | Freq: Two times a day (BID) | ORAL | 0 refills | Status: DC

## 2019-12-15 MED ORDER — ALBUTEROL SULFATE HFA 108 (90 BASE) MCG/ACT IN AERS
2.0000 | INHALATION_SPRAY | Freq: Once | RESPIRATORY_TRACT | Status: AC
  Administered 2019-12-15: 2 via RESPIRATORY_TRACT

## 2019-12-15 MED ORDER — BENZONATATE 200 MG PO CAPS: 200.0000 mg | ORAL_CAPSULE | Freq: Two times a day (BID) | ORAL | 0 refills | Status: DC | PRN

## 2019-12-15 MED ORDER — ALBUTEROL SULFATE HFA 108 (90 BASE) MCG/ACT IN AERS
INHALATION_SPRAY | RESPIRATORY_TRACT | Status: AC
  Filled 2019-12-15: qty 6.7

## 2019-12-15 MED ORDER — AEROCHAMBER PLUS FLO-VU LARGE MISC
Status: AC
  Filled 2019-12-15: qty 1

## 2019-12-15 MED ORDER — ALBUTEROL SULFATE HFA 108 (90 BASE) MCG/ACT IN AERS: 1.0000 | INHALATION_SPRAY | Freq: Four times a day (QID) | RESPIRATORY_TRACT | 0 refills | Status: DC | PRN

## 2019-12-15 MED ORDER — AEROCHAMBER PLUS FLO-VU MEDIUM MISC
1.0000 | Freq: Once | Status: AC
  Administered 2019-12-15: 1

## 2019-12-15 NOTE — ED Triage Notes (Signed)
Pt presents with shortness of breath & cough (asthma) X 2 days and sore throat.

## 2019-12-15 NOTE — Discharge Instructions (Addendum)
You need to see a primary care doctor for your blood pressure  Take the amoxil 2 x a day Use the albuterol inhaler as needed Drink lots of fluids Tessalon 2-3 times a day for cough

## 2019-12-15 NOTE — ED Provider Notes (Signed)
[place]    CSN: [id number] Arrival date & time: 12/15/19  3557      History   Chief Complaint Chief Complaint  Patient presents with  . Asthma  . Sore Throat    HPI William Gregory is a 34 y.o. male.   HPI  Patient has a history of intermittent asthma He does not have an inhaler at home He has a respiratory infection with some runny nose, wheezing, coughing, and sore throat since yesterday No fever or chills No sputum production He has not had Covid vaccinations He denies body aches, headaches, fever chills, loss of taste or smell  Past Medical History:  Diagnosis Date  . Alcohol abuse   . Asthma   . Back pain   . Depression   . Knee joint pain, left   . Psychiatric problem    unspecified by patient (receives "injections" bi-weekly)  . Tobacco abuse     Patient Active Problem List   Diagnosis Date Noted  . AKI (acute kidney injury) ([place]) 08/14/2015  . Nausea vomiting and diarrhea 08/14/2015  . Abdominal pain 08/14/2015  . GERD (gastroesophageal reflux disease) 08/14/2015  . Acute kidney injury ([place])   . ACUTE KIDNEY FAILURE UNSPECIFIED 07/09/2009  . TOBACCO USER 06/07/2009  . KNEE PAIN, RIGHT, CHRONIC 01/15/2009  . Backache 01/15/2009  . LEG EDEMA, BILATERAL 12/15/2008  . Alcohol abuse 04/02/2008  . Depression 04/02/2008  . MYOCARDIAL INFARCTION, HX OF 04/02/2008  . Asthma 04/02/2008    History reviewed. No pertinent surgical history.     Home Medications    Prior to Admission medications   Medication Sig Start Date End Date Taking? Authorizing Provider  albuterol (VENTOLIN HFA) 108 (90 Base) MCG/ACT inhaler Inhale 1-2 puffs into the lungs every 6 (six) hours as needed for wheezing or shortness of breath. 12/15/19   Raylene Everts, MD  amoxicillin (AMOXIL) 875 MG tablet Take 1 tablet (875 mg total) by mouth 2 (two) times daily. 12/15/19   Raylene Everts, MD  benzonatate (TESSALON) 200 MG capsule Take 1 capsule (200 mg  total) by mouth 2 (two) times daily as needed for cough. 12/15/19   Raylene Everts, MD  citalopram (CELEXA) 20 MG tablet Take 20 mg by mouth daily.    [provider]  omeprazole (PRILOSEC) 20 MG capsule Take 1 capsule (20 mg total) by mouth 2 (two) times daily before a meal. 04/30/18   Baird Cancer, Vilinda Blanks, PA-C  ondansetron (ZOFRAN ODT) 4 MG disintegrating tablet Take 1 tablet (4 mg total) by mouth every 8 (eight) hours as needed for nausea. 04/30/18   Larene Pickett, PA-C  sucralfate (CARAFATE) 1 g tablet Take 1 tablet (1 g total) by mouth 4 (four) times daily -  with meals and at bedtime. 04/30/18   Larene Pickett, PA-C    Family History Family History  Problem Relation Age of Onset  . Diabetes Mother   . Diabetes Father     Social History Social History   Tobacco Use  . Smoking status: Current Every Day Smoker    Packs/day: 1.00    Types: Cigarettes  . Smokeless tobacco: Never Used  Vaping Use  . Vaping Use: Every day  Substance Use Topics  . Alcohol use: Yes    Comment: 2-3 40 oz beers daily  . Drug use: Yes    Types: Cocaine, Marijuana     Allergies   Aspirin and Ibuprofen   Review of Systems Review of Systems  See HPI  Physical Exam Triage Vital Signs ED Triage Vitals  Enc Vitals Group     BP 12/15/19 1058 (!) 137/100     Pulse Rate 12/15/19 1058 62     Resp 12/15/19 1058 16     Temp 12/15/19 1058 98.6 F (37 C)     Temp Source 12/15/19 1058 Oral     SpO2 12/15/19 1058 98 %     Weight --      Height --      Head Circumference --      Peak Flow --      Pain Score 12/15/19 1059 7     Pain Loc --      Pain Edu? --      Excl. in [place]? --    No data found.  Updated Vital Signs BP (!) 137/100 (BP Location: Right Arm)   Pulse 62   Temp 98.6 F (37 C) (Oral)   Resp 16   SpO2 98%       Physical Exam Constitutional:      General: He is not in acute distress.    Appearance: He is well-developed and normal weight.  HENT:     Head:  Normocephalic and atraumatic.     Right Ear: Tympanic membrane normal.     Left Ear: Tympanic membrane normal.     Nose: Nose normal.     Mouth/Throat:     Mouth: Mucous membranes are moist.     Pharynx: Posterior oropharyngeal erythema present.     Comments: Slight erythema no exudate Eyes:     Conjunctiva/sclera: Conjunctivae normal.     Pupils: Pupils are equal, round, and reactive to light.  Cardiovascular:     Rate and Rhythm: Normal rate.  Pulmonary:     Effort: Pulmonary effort is normal. No respiratory distress.     Breath sounds: Wheezing present. No rhonchi.  Musculoskeletal:        General: Normal range of motion.     Cervical back: Normal range of motion.  Lymphadenopathy:     Cervical: No cervical adenopathy.  Skin:    General: Skin is warm and dry.  Neurological:     Mental Status: He is alert.  Psychiatric:        Mood and Affect: Mood normal.        Behavior: Behavior normal.      UC Treatments / Results  Labs (all labs ordered are listed, but only abnormal results are displayed) Labs Reviewed  SARS CORONAVIRUS 2 (TAT 6-24 HRS)  CULTURE, GROUP A STREP [facility])  POCT RAPID STREP A    EKG   Radiology No results found.  Procedures Procedures (including critical care time)  Medications Ordered in UC Medications  albuterol (VENTOLIN HFA) 108 (90 Base) MCG/ACT inhaler 2 puff (2 puffs Inhalation Provided for home use 12/15/19 1120)  AeroChamber Plus Flo-Vu Medium MISC 1 each (1 each Other Provided for home use 12/15/19 1120)    Initial Impression / Assessment and Plan / UC Course  I have reviewed the triage vital signs and the nursing notes.  Pertinent labs & imaging results that were available during my care of the patient were reviewed by me and considered in my medical decision making (see chart for details).     Strep is negative.  Will order culture Explained this is a viral URI Patient does have dental caries and states that he has been  having a lot of dental pain as well He requests  antibiotic for his teeth and this is given to him Final Clinical Impressions(s) / UC Diagnoses   Final diagnoses:  Mild intermittent asthma with acute exacerbation  Elevated blood-pressure reading without diagnosis of hypertension  Pain, dental     Discharge Instructions     You need to see a primary care doctor for your blood pressure  Take the amoxil 2 x a day Use the albuterol inhaler as needed Drink lots of fluids Tessalon 2-3 times a day for cough    ED Prescriptions    Medication Sig Dispense Auth. Provider   albuterol (VENTOLIN HFA) 108 (90 Base) MCG/ACT inhaler Inhale 1-2 puffs into the lungs every 6 (six) hours as needed for wheezing or shortness of breath. 18 g Raylene Everts, MD   amoxicillin (AMOXIL) 875 MG tablet Take 1 tablet (875 mg total) by mouth 2 (two) times daily. 14 tablet Raylene Everts, MD   benzonatate (TESSALON) 200 MG capsule Take 1 capsule (200 mg total) by mouth 2 (two) times daily as needed for cough. 20 capsule Raylene Everts, MD     PDMP not reviewed this encounter.   Raylene Everts, MD 12/15/19 1308

## 2019-12-17 LAB — CULTURE, GROUP A STREP (THRC)

## 2019-12-20 ENCOUNTER — Ambulatory Visit: Payer: Medicaid Other | Attending: Internal Medicine

## 2019-12-20 DIAGNOSIS — Z23 Encounter for immunization: Secondary | ICD-10-CM

## 2019-12-20 NOTE — Progress Notes (Signed)
   Covid-19 Vaccination Clinic  Name:  NERY FRAPPIER    MRN: [id number] DOB: [birth year]  12/20/2019  Mr. Vanblarcom was observed post Covid-19 immunization for 15 minutes without incident. He was provided with Vaccine Information Sheet and instruction to access the V-Safe system.   Mr. Swopes was instructed to call 911 with any severe reactions post vaccine: Marland Kitchen Difficulty breathing  . Swelling of face and throat  . A fast heartbeat  . A bad rash all over body  . Dizziness and weakness   Immunizations Administered    Name Date Dose VIS Date Route   JANSSEN COVID-19 VACCINE 12/20/2019 11:27 AM 0.5 mL 08/02/2019 Intramuscular   Manufacturer: [employer]   Lot: [id number]   [place]: [id number]

## 2020-05-08 ENCOUNTER — Emergency Department (HOSPITAL_COMMUNITY)
Admission: EM | Admit: 2020-05-08 | Discharge: 2020-05-08 | Disposition: A | Discharge status: Home / Self Care | Payer: Medicaid Other | Attending: Emergency Medicine | Admitting: Emergency Medicine

## 2020-05-08 ENCOUNTER — Other Ambulatory Visit: Payer: Self-pay

## 2020-05-08 ENCOUNTER — Encounter (HOSPITAL_COMMUNITY): Payer: Self-pay | Admitting: *Deleted

## 2020-05-08 DIAGNOSIS — J45909 Unspecified asthma, uncomplicated: Secondary | ICD-10-CM | POA: Insufficient documentation

## 2020-05-08 DIAGNOSIS — Z20822 Contact with and (suspected) exposure to covid-19: Secondary | ICD-10-CM | POA: Diagnosis not present

## 2020-05-08 DIAGNOSIS — Z1152 Encounter for screening for COVID-19: Secondary | ICD-10-CM | POA: Diagnosis present

## 2020-05-08 DIAGNOSIS — F17210 Nicotine dependence, cigarettes, uncomplicated: Secondary | ICD-10-CM | POA: Insufficient documentation

## 2020-05-08 HISTORY — DX: Cervicalgia: M54.2

## 2020-05-08 LAB — RESP PANEL BY RT-PCR (FLU A&B, COVID) ARPGX2
Influenza A by PCR: NEGATIVE
Influenza B by PCR: NEGATIVE
SARS Coronavirus 2 by RT PCR: NEGATIVE

## 2020-05-08 NOTE — Discharge Instructions (Addendum)
We will contact you if your Covid or flu test are positive. Return to the ER if you start to experience chest pain, shortness of breath, leg swelling or coughing up blood.

## 2020-05-08 NOTE — ED Provider Notes (Signed)
[place] DEPT Provider Note   CSN: [id number] Arrival date & time: 05/08/20  5643     History Chief Complaint  Patient presents with  . Covid Test    William Gregory is a 34 y.o. male with a past medical history of asthma, alcohol abuse presenting to the ED requesting Covid test.  States that he had exposure to someone who had a constant cough and would like to be tested.  No known Covid exposures.  He has been vaccinated against Covid.  Denies any cough, shortness of breath, fever, vomiting or diarrhea.  HPI     Past Medical History:  Diagnosis Date  . Alcohol abuse   . Asthma   . Back pain   . Depression   . Knee joint pain, left   . Neck pain   . Psychiatric problem    unspecified by patient (receives "injections" bi-weekly)  . Tobacco abuse     Patient Active Problem List   Diagnosis Date Noted  . AKI (acute kidney injury) ([place]) 08/14/2015  . Nausea vomiting and diarrhea 08/14/2015  . Abdominal pain 08/14/2015  . GERD (gastroesophageal reflux disease) 08/14/2015  . Acute kidney injury ([place])   . ACUTE KIDNEY FAILURE UNSPECIFIED 07/09/2009  . TOBACCO USER 06/07/2009  . KNEE PAIN, RIGHT, CHRONIC 01/15/2009  . Backache 01/15/2009  . LEG EDEMA, BILATERAL 12/15/2008  . Alcohol abuse 04/02/2008  . Depression 04/02/2008  . MYOCARDIAL INFARCTION, HX OF 04/02/2008  . Asthma 04/02/2008    History reviewed. No pertinent surgical history.     Family History  Problem Relation Age of Onset  . Diabetes Mother   . Diabetes Father     Social History   Tobacco Use  . Smoking status: Current Every Day Smoker    Packs/day: 1.00    Types: Cigarettes  . Smokeless tobacco: Never Used  Vaping Use  . Vaping Use: Every day  Substance Use Topics  . Alcohol use: Yes    Comment: 2-3 40 oz beers daily  . Drug use: Yes    Types: Cocaine, Marijuana    Home Medications Prior to Admission medications   Medication Sig Start Date End  Date Taking? Authorizing Provider  albuterol (VENTOLIN HFA) 108 (90 Base) MCG/ACT inhaler Inhale 1-2 puffs into the lungs every 6 (six) hours as needed for wheezing or shortness of breath. 12/15/19   Raylene Everts, MD  amoxicillin (AMOXIL) 875 MG tablet Take 1 tablet (875 mg total) by mouth 2 (two) times daily. 12/15/19   Raylene Everts, MD  benzonatate (TESSALON) 200 MG capsule Take 1 capsule (200 mg total) by mouth 2 (two) times daily as needed for cough. 12/15/19   Raylene Everts, MD  citalopram (CELEXA) 20 MG tablet Take 20 mg by mouth daily.    [provider]  omeprazole (PRILOSEC) 20 MG capsule Take 1 capsule (20 mg total) by mouth 2 (two) times daily before a meal. 04/30/18   Baird Cancer, Vilinda Blanks, PA-C  ondansetron (ZOFRAN ODT) 4 MG disintegrating tablet Take 1 tablet (4 mg total) by mouth every 8 (eight) hours as needed for nausea. 04/30/18   Larene Pickett, PA-C  sucralfate (CARAFATE) 1 g tablet Take 1 tablet (1 g total) by mouth 4 (four) times daily -  with meals and at bedtime. 04/30/18   Larene Pickett, PA-C    Allergies    Aspirin and Ibuprofen  Review of Systems   Review of Systems  Constitutional: Negative for  fever.  Respiratory: Negative for cough and shortness of breath.   Gastrointestinal: Negative for diarrhea and vomiting.  Musculoskeletal: Negative for myalgias.    Physical Exam Updated Vital Signs BP (!) 145/98 (BP Location: Right Arm)   Pulse 83   Temp 97.8 F (36.6 C) (Oral)   Resp 17   Ht 5\' 8"  (1.727 m)   SpO2 95%   BMI 31.31 kg/m   Physical Exam Vitals and nursing note reviewed.  Constitutional:      General: He is not in acute distress.    Appearance: He is well-developed. He is not diaphoretic.     Comments: Speaking in complete sentences without difficulty.  HENT:     Head: Normocephalic and atraumatic.  Eyes:     General: No scleral icterus.    Conjunctiva/sclera: Conjunctivae normal.  Cardiovascular:     Rate and Rhythm:  Normal rate and regular rhythm.     Heart sounds: Normal heart sounds.  Pulmonary:     Effort: Pulmonary effort is normal. No respiratory distress.     Breath sounds: Normal breath sounds.  Musculoskeletal:     Cervical back: Normal range of motion.  Skin:    Findings: No rash.  Neurological:     Mental Status: He is alert.     ED Results / Procedures / Treatments   Labs (all labs ordered are listed, but only abnormal results are displayed) Labs Reviewed  RESP PANEL BY RT-PCR (FLU A&B, COVID) [id number]    EKG None  Radiology No results found.  Procedures Procedures (including critical care time)  Medications Ordered in ED Medications - No data to display  ED Course  I have reviewed the triage vital signs and the nursing notes.  Pertinent labs & imaging results that were available during my care of the patient were reviewed by me and considered in my medical decision making (see chart for details).    MDM Rules/Calculators/A&P                          William Gregory was evaluated in Emergency Department on 05/08/20 for the symptoms described in the history of present illness. He/she was evaluated in the context of the global COVID-19 pandemic, which necessitated consideration that the patient might be at risk for infection with the SARS-CoV-2 virus that causes COVID-19. Institutional protocols and algorithms that pertain to the evaluation of patients at risk for COVID-19 are in a state of rapid change based on information released by regulatory bodies including the CDC and federal and state organizations. These policies and algorithms were followed during the patient's care in the ED.  34 year old male who is vaccinated against Covid with a history of asthma and tobacco abuse presenting to the ED requesting Covid test.  States that he was around someone that was coughing but denies any known Covid exposures.  Patient asymptomatic.  No signs of respiratory distress.  Lungs  are clear on my exam.  Oxygen saturations above 95% on room air.  He remains hemodynamically stable.  We will have him follow-up with the results of Covid and flu test when available.  Return precautions given.   Patient is hemodynamically stable, in NAD, and able to ambulate in the ED. Evaluation does not show pathology that would require ongoing emergent intervention or inpatient treatment. I explained the diagnosis to the patient. Pain has been managed and has no complaints prior to discharge. Patient is comfortable with above plan and  is stable for discharge at this time. All questions were answered prior to disposition. Strict return precautions for returning to the ED were discussed. Encouraged follow up with PCP.   An After Visit Summary was printed and given to the patient.   Portions of this note were generated with [employer]. Dictation errors may occur despite best attempts at proofreading.  Final Clinical Impression(s) / ED Diagnoses Final diagnoses:  Encounter for laboratory testing for COVID-19 virus    Rx / DC Orders ED Discharge Orders    None       Delia Heady, PA-C 05/08/20 5749    Milton Ferguson, MD 05/09/20 [phone]

## 2020-05-08 NOTE — ED Triage Notes (Signed)
"  I was aropund someone who started to xough" unknown if Covid + or not. "I just want a nose swab"

## 2020-05-22 ENCOUNTER — Emergency Department (HOSPITAL_COMMUNITY)
Admission: EM | Admit: 2020-05-22 | Discharge: 2020-05-22 | Disposition: A | Discharge status: Home / Self Care | Payer: Medicaid Other | Attending: Emergency Medicine | Admitting: Emergency Medicine

## 2020-05-22 ENCOUNTER — Encounter (HOSPITAL_COMMUNITY): Payer: Self-pay

## 2020-05-22 ENCOUNTER — Other Ambulatory Visit: Payer: Self-pay

## 2020-05-22 DIAGNOSIS — F17210 Nicotine dependence, cigarettes, uncomplicated: Secondary | ICD-10-CM | POA: Diagnosis not present

## 2020-05-22 DIAGNOSIS — Y9269 Other specified industrial and construction area as the place of occurrence of the external cause: Secondary | ICD-10-CM | POA: Insufficient documentation

## 2020-05-22 DIAGNOSIS — S3992XA Unspecified injury of lower back, initial encounter: Secondary | ICD-10-CM | POA: Diagnosis present

## 2020-05-22 DIAGNOSIS — S39012A Strain of muscle, fascia and tendon of lower back, initial encounter: Secondary | ICD-10-CM | POA: Insufficient documentation

## 2020-05-22 DIAGNOSIS — X500XXA Overexertion from strenuous movement or load, initial encounter: Secondary | ICD-10-CM | POA: Diagnosis not present

## 2020-05-22 DIAGNOSIS — J45909 Unspecified asthma, uncomplicated: Secondary | ICD-10-CM | POA: Diagnosis not present

## 2020-05-22 MED ORDER — METHOCARBAMOL 500 MG PO TABS: 500.0000 mg | ORAL_TABLET | Freq: Two times a day (BID) | ORAL | 0 refills | Status: DC | PRN

## 2020-05-22 MED ORDER — KETOROLAC TROMETHAMINE 60 MG/2ML IM SOLN
60.0000 mg | Freq: Once | INTRAMUSCULAR | Status: AC
  Administered 2020-05-22: 60 mg via INTRAMUSCULAR
  Filled 2020-05-22: qty 2

## 2020-05-22 MED ORDER — NAPROXEN 500 MG PO TABS: 500.0000 mg | ORAL_TABLET | Freq: Two times a day (BID) | ORAL | 0 refills | Status: DC

## 2020-05-22 NOTE — Discharge Instructions (Signed)
Please take the medication, as directed.  Do not combine with other NSAIDs.  Discontinue immediately should you develop any rash or other allergic reaction.  In that circumstance you would also need to immediately take Benadryl and return to the ED.  However, he tolerated NSAIDs here in the ED well and I have low suspicion for reaction.  You were given a prescription for Robaxin which is a muscle relaxer.  You should not drive, work, consume alcohol, or operate machinery while taking this medication as it can make you very drowsy.    Please read the attachments on lumbar strain and back injury prevention.  I would also like you to get established with a primary care provider at [facility].  Return to the ED or seek immediate medical attention should you experience any new or worsening symptoms.
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
## 2020-05-22 NOTE — ED Triage Notes (Signed)
Pt reports constant left lower back pain, but reports shooting pain all across lower back when bending over.

## 2022-02-12 ENCOUNTER — Ambulatory Visit (HOSPITAL_COMMUNITY)
Admission: EM | Admit: 2022-02-12 | Discharge: 2022-02-12 | Disposition: A | Discharge status: Home / Self Care | Payer: Medicaid Other | Attending: Family Medicine | Admitting: Family Medicine

## 2022-02-12 ENCOUNTER — Encounter (HOSPITAL_COMMUNITY): Payer: Self-pay | Admitting: *Deleted

## 2022-02-12 DIAGNOSIS — R059 Cough, unspecified: Secondary | ICD-10-CM | POA: Insufficient documentation

## 2022-02-12 DIAGNOSIS — G252 Other specified forms of tremor: Secondary | ICD-10-CM | POA: Diagnosis present

## 2022-02-12 DIAGNOSIS — Z20822 Contact with and (suspected) exposure to covid-19: Secondary | ICD-10-CM | POA: Diagnosis not present

## 2022-02-12 DIAGNOSIS — J4521 Mild intermittent asthma with (acute) exacerbation: Secondary | ICD-10-CM | POA: Insufficient documentation

## 2022-02-12 DIAGNOSIS — J069 Acute upper respiratory infection, unspecified: Secondary | ICD-10-CM | POA: Diagnosis present

## 2022-02-12 LAB — SARS CORONAVIRUS 2 BY RT PCR: SARS Coronavirus 2 by RT PCR: NEGATIVE

## 2022-02-12 MED ORDER — AMLODIPINE BESYLATE 5 MG PO TABS: 5.0000 mg | ORAL_TABLET | Freq: Every day | ORAL | 2 refills | Status: DC

## 2022-02-12 MED ORDER — ALBUTEROL SULFATE HFA 108 (90 BASE) MCG/ACT IN AERS: 2.0000 | INHALATION_SPRAY | RESPIRATORY_TRACT | 0 refills | Status: DC | PRN

## 2022-02-12 MED ORDER — PREDNISONE 20 MG PO TABS: 40.0000 mg | ORAL_TABLET | Freq: Every day | ORAL | 0 refills | Status: AC

## 2022-02-12 NOTE — Discharge Instructions (Signed)
Albuterol inhaler--do 2 puffs every 4 hours as needed for shortness of breath or wheezing  Take prednisone 20 mg--2 daily for 5 days  You can use the QR code/website at the back of the summary paperwork to schedule yourself a new patient appointment with primary care

## 2022-02-12 NOTE — ED Provider Notes (Addendum)
[facility]    CSN: [id number] Arrival date & time: 02/12/22  1141      History   Chief Complaint Chief Complaint  Patient presents with   Cough   Shortness of Breath    HPI William Gregory is a 36 y.o. male.    Cough Associated symptoms: shortness of breath   Shortness of Breath Associated symptoms: cough    here with cough and extra wheezing since September 6.  He had some fever the first day, but that is resolved.  He has had some nasal congestion and rhinorrhea also.  No nausea or vomiting or diarrhea.  No ear pain.  Also notes a 3 or 4-year history of tremor of his upper extremities and his tongue.  He was not taking any antipsychotics when this began.  And it has been progressive  He does not have a primary care doctor  Past Medical History:  Diagnosis Date   Alcohol abuse    Asthma    Back pain    Depression    Knee joint pain, left    Neck pain    Psychiatric problem    unspecified by patient (receives "injections" bi-weekly)   Tobacco abuse     Patient Active Problem List   Diagnosis Date Noted   AKI (acute kidney injury) ([place]) 08/14/2015   Nausea vomiting and diarrhea 08/14/2015   Abdominal pain 08/14/2015   GERD (gastroesophageal reflux disease) 08/14/2015   Acute kidney injury [facility])    ACUTE KIDNEY FAILURE UNSPECIFIED 07/09/2009   TOBACCO USER 06/07/2009   KNEE PAIN, RIGHT, CHRONIC 01/15/2009   Backache 01/15/2009   LEG EDEMA, BILATERAL 12/15/2008   Alcohol abuse 04/02/2008   Depression 04/02/2008   MYOCARDIAL INFARCTION, HX OF 04/02/2008   Asthma 04/02/2008    Past Surgical History:  Procedure Laterality Date   arm surgery         Home Medications    Prior to Admission medications   Medication Sig Start Date End Date Taking? Authorizing Provider  amLODipine (NORVASC) 5 MG tablet Take 1 tablet (5 mg total) by mouth daily. 02/12/22  Yes Luverta Korte, Gwenlyn Perking, MD  predniSONE (DELTASONE) 20 MG tablet Take 2 tablets (40 mg  total) by mouth daily with breakfast for 5 days. 02/12/22 02/17/22 Yes Labrittany Wechter, Gwenlyn Perking, MD  UNKNOWN TO PATIENT Pt reports he takes a "pain shot" every once in a while - unk name   Yes [provider]  albuterol (VENTOLIN HFA) 108 (90 Base) MCG/ACT inhaler Inhale 2 puffs into the lungs every 4 (four) hours as needed for wheezing or shortness of breath. 02/12/22   Barrett Henle, MD  citalopram (CELEXA) 20 MG tablet Take 20 mg by mouth daily.    [provider]  omeprazole (PRILOSEC) 20 MG capsule Take 1 capsule (20 mg total) by mouth 2 (two) times daily before a meal. 04/30/18   Baird Cancer, Vilinda Blanks, PA-C  sucralfate (CARAFATE) 1 g tablet Take 1 tablet (1 g total) by mouth 4 (four) times daily -  with meals and at bedtime. 04/30/18   Larene Pickett, PA-C    Family History Family History  Problem Relation Age of Onset   Diabetes Mother    Diabetes Father     Social History Social History   Tobacco Use   Smoking status: Every Day    Packs/day: 1.00    Types: Cigarettes   Smokeless tobacco: Never  Vaping Use   Vaping Use: Former  Substance Use  Topics   Alcohol use: Not Currently    Comment: sober x "months"   Drug use: Yes    Types: Cocaine, Marijuana    Comment: Hasn't used cocaine in "months"     Allergies   Aspirin and Ibuprofen   Review of Systems Review of Systems  Respiratory:  Positive for cough and shortness of breath.      Physical Exam Triage Vital Signs ED Triage Vitals  Enc Vitals Group     BP 02/12/22 1217 (!) 169/120     Pulse Rate 02/12/22 1215 88     Resp 02/12/22 1216 20     Temp 02/12/22 1215 98.4 F (36.9 C)     Temp Source 02/12/22 1215 Oral     SpO2 02/12/22 1215 100 %     Weight --      Height --      Head Circumference --      Peak Flow --      Pain Score 02/12/22 1217 0     Pain Loc --      Pain Edu? --      Excl. in [place]? --    No data found.  Updated Vital Signs BP (!) 185/136 (BP Location: Left Arm)   Pulse 85    Temp 99 F (37.2 C) (Oral)   Resp 20   SpO2 97%   Visual Acuity Right Eye Distance:   Left Eye Distance:   Bilateral Distance:    Right Eye Near:   Left Eye Near:    Bilateral Near:     Physical Exam Vitals reviewed.  Constitutional:      General: He is not in acute distress.    Appearance: He is not ill-appearing, toxic-appearing or diaphoretic.  HENT:     Nose: Nose normal.     Mouth/Throat:     Mouth: Mucous membranes are moist.     Pharynx: No oropharyngeal exudate or posterior oropharyngeal erythema.  Eyes:     Extraocular Movements: Extraocular movements intact.     Conjunctiva/sclera: Conjunctivae normal.     Pupils: Pupils are equal, round, and reactive to light.  Cardiovascular:     Rate and Rhythm: Normal rate and regular rhythm.     Heart sounds: No murmur heard. Pulmonary:     Effort: Pulmonary effort is normal. No respiratory distress.     Breath sounds: No stridor. No wheezing, rhonchi or rales.  Musculoskeletal:     Cervical back: Neck supple.  Lymphadenopathy:     Cervical: No cervical adenopathy.  Skin:    Capillary Refill: Capillary refill takes less than 2 seconds.     Coloration: Skin is not jaundiced or pale.  Neurological:     Mental Status: He is alert and oriented to person, place, and time.     Comments: He has a noticeable tremor at rest of the upper extremities, more on the right than on the left.  It does improve when he extends his arms, but there is still a fine tremor when he is extending his arms forward.  There is also a tremor of his tongue.  Psychiatric:        Behavior: Behavior normal.      UC Treatments / Results  Labs (all labs ordered are listed, but only abnormal results are displayed) Labs Reviewed  SARS CORONAVIRUS 2 BY RT PCR    EKG   Radiology No results found.  Procedures Procedures (including critical care time)  Medications Ordered in UC Medications -  No data to display  Initial Impression /  Assessment and Plan / UC Course  I have reviewed the triage vital signs and the nursing notes.  Pertinent labs & imaging results that were available during my care of the patient were reviewed by me and considered in my medical decision making (see chart for details).        He is swabbed for COVID, and he should have a prescription for molnupiravir if positive.  His EGFR is around 30.  He is treated for asthma exacerbation  Given him contact information for neurology and assistance is requested to help him find a PCP  AT dc, recheck of his bp showed it to still be very high, 180s/120s. Amlodipine begun. Final Clinical Impressions(s) / UC Diagnoses   Final diagnoses:  Viral URI with cough  Mild intermittent asthma with exacerbation  Resting tremor     Discharge Instructions      Albuterol inhaler--do 2 puffs every 4 hours as needed for shortness of breath or wheezing  Take prednisone 20 mg--2 daily for 5 days  You can use the QR code/website at the back of the summary paperwork to schedule yourself a new patient appointment with primary care       ED Prescriptions     Medication Sig Dispense Auth. Provider   albuterol (VENTOLIN HFA) 108 (90 Base) MCG/ACT inhaler Inhale 2 puffs into the lungs every 4 (four) hours as needed for wheezing or shortness of breath. 18 g Barrett Henle, MD   predniSONE (DELTASONE) 20 MG tablet Take 2 tablets (40 mg total) by mouth daily with breakfast for 5 days. 10 tablet Barrett Henle, MD   amLODipine (NORVASC) 5 MG tablet Take 1 tablet (5 mg total) by mouth daily. 30 tablet Kyshawn Teal, Gwenlyn Perking, MD      PDMP not reviewed this encounter.   Barrett Henle, MD 02/12/22 1319    Barrett Henle, MD 02/12/22 [phone]

## 2022-02-12 NOTE — ED Triage Notes (Signed)
C/O dyspnea, cough, congestion onset 3-4 days ago. Has hx asthma. States lost albuterol inhaler. Denies fevers, but states he has felt feverish. Pt reporting constant "shaking" in BUE and tongue x few yrs, requesting medicine to fix it; states has never had eval for it.

## 2022-02-13 ENCOUNTER — Encounter: Payer: Self-pay | Admitting: Neurology
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
## 2022-03-20 ENCOUNTER — Other Ambulatory Visit: Payer: Self-pay

## 2022-03-20 ENCOUNTER — Observation Stay (HOSPITAL_COMMUNITY)
Admission: EM | Admit: 2022-03-20 | Discharge: 2022-03-21 | Discharge status: Against Medical Advice | Payer: Medicaid Other | Attending: Family Medicine | Admitting: Family Medicine

## 2022-03-20 ENCOUNTER — Encounter (HOSPITAL_COMMUNITY): Payer: Self-pay | Admitting: Emergency Medicine

## 2022-03-20 ENCOUNTER — Ambulatory Visit (INDEPENDENT_AMBULATORY_CARE_PROVIDER_SITE_OTHER): Payer: Medicaid Other | Admitting: Neurology

## 2022-03-20 ENCOUNTER — Encounter: Payer: Self-pay | Admitting: Neurology

## 2022-03-20 ENCOUNTER — Emergency Department (HOSPITAL_COMMUNITY): Payer: Medicaid Other

## 2022-03-20 ENCOUNTER — Inpatient Hospital Stay (HOSPITAL_COMMUNITY): Payer: Medicaid Other

## 2022-03-20 ENCOUNTER — Telehealth: Payer: Self-pay

## 2022-03-20 ENCOUNTER — Other Ambulatory Visit (INDEPENDENT_AMBULATORY_CARE_PROVIDER_SITE_OTHER): Payer: Medicaid Other

## 2022-03-20 VITALS — BP 159/100 | HR 91 | Ht 65.0 in | Wt 253.4 lb

## 2022-03-20 DIAGNOSIS — N179 Acute kidney failure, unspecified: Secondary | ICD-10-CM | POA: Diagnosis not present

## 2022-03-20 DIAGNOSIS — Z79899 Other long term (current) drug therapy: Secondary | ICD-10-CM | POA: Insufficient documentation

## 2022-03-20 DIAGNOSIS — Z5181 Encounter for therapeutic drug level monitoring: Secondary | ICD-10-CM | POA: Diagnosis not present

## 2022-03-20 DIAGNOSIS — I2510 Atherosclerotic heart disease of native coronary artery without angina pectoris: Secondary | ICD-10-CM | POA: Diagnosis not present

## 2022-03-20 DIAGNOSIS — F329 Major depressive disorder, single episode, unspecified: Secondary | ICD-10-CM | POA: Diagnosis not present

## 2022-03-20 DIAGNOSIS — G2401 Drug induced subacute dyskinesia: Secondary | ICD-10-CM

## 2022-03-20 DIAGNOSIS — R251 Tremor, unspecified: Secondary | ICD-10-CM

## 2022-03-20 DIAGNOSIS — K219 Gastro-esophageal reflux disease without esophagitis: Secondary | ICD-10-CM | POA: Diagnosis not present

## 2022-03-20 DIAGNOSIS — N19 Unspecified kidney failure: Principal | ICD-10-CM

## 2022-03-20 DIAGNOSIS — J45909 Unspecified asthma, uncomplicated: Secondary | ICD-10-CM | POA: Diagnosis not present

## 2022-03-20 LAB — CBC WITH DIFFERENTIAL/PLATELET
Abs Immature Granulocytes: 0.02 10*3/uL (ref 0.00–0.07)
Basophils Absolute: 0.1 10*3/uL (ref 0.0–0.1)
Basophils Relative: 1 %
Eosinophils Absolute: 0.1 10*3/uL (ref 0.0–0.5)
Eosinophils Relative: 1 %
HCT: 32.5 % — ABNORMAL LOW (ref 39.0–52.0)
Hemoglobin: 10.8 g/dL — ABNORMAL LOW (ref 13.0–17.0)
Immature Granulocytes: 0 %
Lymphocytes Relative: 24 %
Lymphs Abs: 2.1 10*3/uL (ref 0.7–4.0)
MCH: 30.5 pg (ref 26.0–34.0)
MCHC: 33.2 g/dL (ref 30.0–36.0)
MCV: 91.8 fL (ref 80.0–100.0)
Monocytes Absolute: 0.6 10*3/uL (ref 0.1–1.0)
Monocytes Relative: 7 %
Neutro Abs: 6 10*3/uL (ref 1.7–7.7)
Neutrophils Relative %: 67 %
Platelets: 286 10*3/uL (ref 150–400)
RBC: 3.54 MIL/uL — ABNORMAL LOW (ref 4.22–5.81)
RDW: 13.7 % (ref 11.5–15.5)
WBC: 8.9 10*3/uL (ref 4.0–10.5)
nRBC: 0 % (ref 0.0–0.2)

## 2022-03-20 LAB — COMPREHENSIVE METABOLIC PANEL
ALT: 12 U/L (ref 0–53)
ALT: 14 U/L (ref 0–44)
AST: 14 U/L (ref 0–37)
AST: 16 U/L (ref 15–41)
Albumin: 4.5 g/dL (ref 3.5–5.2)
Albumin: 3.9 g/dL (ref 3.5–5.0)
Alkaline Phosphatase: 95 U/L (ref 39–117)
Alkaline Phosphatase: 81 U/L (ref 38–126)
Anion gap: 12 (ref 5–15)
BUN: 46 mg/dL — ABNORMAL HIGH (ref 6–23)
BUN: 46 mg/dL — ABNORMAL HIGH (ref 6–20)
CO2: 19 mEq/L (ref 19–32)
CO2: 17 mmol/L — ABNORMAL LOW (ref 22–32)
Calcium: 9.9 mg/dL (ref 8.4–10.5)
Calcium: 9.6 mg/dL (ref 8.9–10.3)
Chloride: 108 mEq/L (ref 96–112)
Chloride: 109 mmol/L (ref 98–111)
Creatinine, Ser: 9.54 mg/dL (ref 0.40–1.50)
Creatinine, Ser: 10.27 mg/dL — ABNORMAL HIGH (ref 0.61–1.24)
GFR, Estimated: 6 mL/min — ABNORMAL LOW (ref >60)
GFR: 6.36 mL/min — CL (ref >60.00)
Glucose, Bld: 102 mg/dL — ABNORMAL HIGH (ref 70–99)
Glucose, Bld: 109 mg/dL — ABNORMAL HIGH (ref 70–99)
Potassium: 4 mEq/L (ref 3.5–5.1)
Potassium: 3.7 mmol/L (ref 3.5–5.1)
Sodium: 140 mEq/L (ref 135–145)
Sodium: 138 mmol/L (ref 135–145)
Total Bilirubin: 0.4 mg/dL (ref 0.2–1.2)
Total Bilirubin: 0.7 mg/dL (ref 0.3–1.2)
Total Protein: 8.2 g/dL (ref 6.0–8.3)
Total Protein: 7.5 g/dL (ref 6.5–8.1)

## 2022-03-20 LAB — URINALYSIS, ROUTINE W REFLEX MICROSCOPIC
Bilirubin Urine: NEGATIVE
Glucose, UA: NEGATIVE mg/dL
Ketones, ur: NEGATIVE mg/dL
Leukocytes,Ua: NEGATIVE
Nitrite: NEGATIVE
Protein, ur: 100 mg/dL — AB
Specific Gravity, Urine: 1.008 (ref 1.005–1.030)
pH: 5 (ref 5.0–8.0)

## 2022-03-20 LAB — CBC
HCT: 33.5 % — ABNORMAL LOW (ref 39.0–52.0)
Hemoglobin: 11.2 g/dL — ABNORMAL LOW (ref 13.0–17.0)
MCHC: 33.3 g/dL (ref 30.0–36.0)
MCV: 89.1 fl (ref 78.0–100.0)
Platelets: 299 10*3/uL (ref 150.0–400.0)
RBC: 3.76 Mil/uL — ABNORMAL LOW (ref 4.22–5.81)
RDW: 14.5 % (ref 11.5–15.5)
WBC: 7.9 10*3/uL (ref 4.0–10.5)

## 2022-03-20 LAB — I-STAT CHEM 8, ED
BUN: 14 mg/dL (ref 6–20)
Calcium, Ion: 1.14 mmol/L — ABNORMAL LOW (ref 1.15–1.40)
Chloride: 103 mmol/L (ref 98–111)
Creatinine, Ser: 0.8 mg/dL (ref 0.61–1.24)
Glucose, Bld: 106 mg/dL — ABNORMAL HIGH (ref 70–99)
HCT: 39 % (ref 39.0–52.0)
Hemoglobin: 13.3 g/dL (ref 13.0–17.0)
Potassium: 4.1 mmol/L (ref 3.5–5.1)
Sodium: 136 mmol/L (ref 135–145)
TCO2: 24 mmol/L (ref 22–32)

## 2022-03-20 LAB — MAGNESIUM: Magnesium: 2.1 mg/dL (ref 1.7–2.4)

## 2022-03-20 LAB — TSH: TSH: 3.36 u[IU]/mL (ref 0.35–5.50)

## 2022-03-20 MED ORDER — HYDRALAZINE HCL 20 MG/ML IJ SOLN: 5.0000 mg | Freq: Four times a day (QID) | INTRAMUSCULAR | Status: DC | PRN

## 2022-03-20 MED ORDER — VALBENAZINE TOSYLATE 80 MG PO CAPS: ORAL_CAPSULE | ORAL | 0 refills | Status: DC

## 2022-03-20 MED ORDER — VALBENAZINE TOSYLATE 40 MG PO CAPS: ORAL_CAPSULE | ORAL | 0 refills | Status: DC

## 2022-03-20 MED ORDER — AMLODIPINE BESYLATE 10 MG PO TABS
10.0000 mg | ORAL_TABLET | Freq: Every day | ORAL | Status: DC
  Administered 2022-03-20 – 2022-03-21 (×2): 10 mg via ORAL
  Filled 2022-03-20: qty 1
  Filled 2022-03-20: qty 2

## 2022-03-20 MED ORDER — HEPARIN SODIUM (PORCINE) 5000 UNIT/ML IJ SOLN
5000.0000 [IU] | Freq: Three times a day (TID) | INTRAMUSCULAR | Status: DC
  Administered 2022-03-20 – 2022-03-21 (×2): 5000 [IU] via SUBCUTANEOUS
  Filled 2022-03-20 (×2): qty 1

## 2022-03-20 MED ORDER — LACTATED RINGERS IV SOLN: INTRAVENOUS | Status: DC

## 2022-03-20 MED ORDER — POLYETHYLENE GLYCOL 3350 17 G PO PACK: 17.0000 g | PACK | Freq: Every day | ORAL | Status: DC | PRN

## 2022-03-20 MED ORDER — MELATONIN 5 MG PO TABS: 5.0000 mg | ORAL_TABLET | Freq: Every evening | ORAL | Status: DC | PRN

## 2022-03-20 MED ORDER — LACTATED RINGERS IV BOLUS
1000.0000 mL | Freq: Once | INTRAVENOUS | Status: AC
  Administered 2022-03-20: 1000 mL via INTRAVENOUS

## 2022-03-20 MED ORDER — ALBUTEROL SULFATE HFA 108 (90 BASE) MCG/ACT IN AERS: 2.0000 | INHALATION_SPRAY | RESPIRATORY_TRACT | Status: DC | PRN

## 2022-03-20 MED ORDER — ALBUTEROL SULFATE (2.5 MG/3ML) 0.083% IN NEBU
2.5000 mg | INHALATION_SOLUTION | Freq: Four times a day (QID) | RESPIRATORY_TRACT | Status: DC | PRN
  Filled 2022-03-20: qty 3

## 2022-03-20 MED ORDER — ONDANSETRON HCL 4 MG/2ML IJ SOLN: 4.0000 mg | Freq: Four times a day (QID) | INTRAMUSCULAR | Status: DC | PRN

## 2022-03-20 NOTE — ED Notes (Signed)
ED TO INPATIENT HANDOFF REPORT  ED Nurse Name and Phone #: Caryl Pina RN [phone]  S Name/Age/Gender William Gregory 36 y.o. male Room/Bed: 034C/034C  Code Status   Code Status: Full Code  Home/SNF/Other Home Patient oriented to: self, place, time, and situation Is this baseline? Yes   Triage Complete: Triage complete  Chief Complaint AKI (acute kidney injury) [facility]) [N17.9]  Triage Note Patient states he was told to go to ED for evaluation by his PCP due to kidney failure. Patient went to PCP initially for evaluation of feeling shakey for several months. Patient denies pain ,patient is alert, oriented, ambulating independently with steady gait and is in no apparent distress at this time.   Allergies Allergies  Allergen Reactions   Aspirin Swelling   Ibuprofen Rash    Level of Care/Admitting Diagnosis ED Disposition     ED Disposition  Admit   Condition  --   Uncertain: [facility] [[id number]]  Level of Care: Telemetry Medical [104]  May admit patient to [facility] or [facility] if equivalent level of care is available:: No  Covid Evaluation: Asymptomatic - no recent exposure (last 10 days) testing not required  Diagnosis: AKI (acute kidney injury) [facility]) [[id number]]  Admitting Physician: Kayleen Memos [[id number]]  Attending Physician: Kayleen Memos [[id number]]  Certification:: I certify this patient will need inpatient services for at least 2 midnights  Estimated Length of Stay: 2          B Medical/Surgery History Past Medical History:  Diagnosis Date   Alcohol abuse    Asthma    Back pain    Depression    Knee joint pain, left    Neck pain    Psychiatric problem    unspecified by patient (receives "injections" bi-weekly)   Tobacco abuse    Past Surgical History:  Procedure Laterality Date   arm surgery       A IV Location/Drains/Wounds Patient Lines/Drains/Airways Status     Active Line/Drains/Airways     Name  Placement date Placement time Site Days   Peripheral IV 03/20/22 20 G Anterior;Left Forearm 03/20/22  1952  Forearm  less than 1            Intake/Output Last 24 hours No intake or output data in the 24 hours ending 03/20/22 2229  Labs/Imaging Results for orders placed or performed during the hospital encounter of 03/20/22 (from the past 48 hour(s))  Urinalysis, Routine w reflex microscopic     Status: Abnormal   Collection Time: 03/20/22  5:13 PM  Result Value Ref Range   Color, Urine STRAW (A) YELLOW   APPearance HAZY (A) CLEAR   Specific Gravity, Urine 1.008 1.005 - 1.030   pH 5.0 5.0 - 8.0   Glucose, UA NEGATIVE NEGATIVE mg/dL   Hgb urine dipstick SMALL (A) NEGATIVE   Bilirubin Urine NEGATIVE NEGATIVE   Ketones, ur NEGATIVE NEGATIVE mg/dL   Protein, ur 100 (A) NEGATIVE mg/dL   Nitrite NEGATIVE NEGATIVE   Leukocytes,Ua NEGATIVE NEGATIVE   RBC / HPF 0-5 0 - 5 RBC/hpf   WBC, UA 0-5 0 - 5 WBC/hpf   Bacteria, UA RARE (A) NONE SEEN   Squamous Epithelial / LPF 0-5 0 - 5   Mucus PRESENT    Sperm, UA PRESENT     Comment: Performed at [facility], [address], [place], [place] [address]  Comprehensive metabolic panel     Status: Abnormal  Collection Time: 03/20/22  5:18 PM  Result Value Ref Range   Sodium 138 135 - 145 mmol/L   Potassium 3.7 3.5 - 5.1 mmol/L   Chloride 109 98 - 111 mmol/L   CO2 17 (L) 22 - 32 mmol/L   Glucose, Bld 109 (H) 70 - 99 mg/dL    Comment: Glucose reference range applies only to samples taken after fasting for at least 8 hours.   BUN 46 (H) 6 - 20 mg/dL   Creatinine, Ser 10.27 (H) 0.61 - 1.24 mg/dL   Calcium 9.6 8.9 - 10.3 mg/dL   Total Protein 7.5 6.5 - 8.1 g/dL   Albumin 3.9 3.5 - 5.0 g/dL   AST 16 15 - 41 U/L   ALT 14 0 - 44 U/L   Alkaline Phosphatase 81 38 - 126 U/L   Total Bilirubin 0.7 0.3 - 1.2 mg/dL   GFR, Estimated 6 (L) >60 mL/min    Comment: (NOTE) Calculated using the CKD-EPI Creatinine Equation (2021)    Anion gap 12  5 - 15    Comment: Performed at [facility] [address], [place], [place] [address]  CBC with Differential     Status: Abnormal   Collection Time: 03/20/22  5:18 PM  Result Value Ref Range   WBC 8.9 4.0 - 10.5 K/uL   RBC 3.54 (L) 4.22 - 5.81 MIL/uL   Hemoglobin 10.8 (L) 13.0 - 17.0 g/dL   HCT 32.5 (L) 39.0 - 52.0 %   MCV 91.8 80.0 - 100.0 fL   MCH 30.5 26.0 - 34.0 pg   MCHC 33.2 30.0 - 36.0 g/dL   RDW 13.7 11.5 - 15.5 %   Platelets 286 150 - 400 K/uL   nRBC 0.0 0.0 - 0.2 %   Neutrophils Relative % 67 %   Neutro Abs 6.0 1.7 - 7.7 K/uL   Lymphocytes Relative 24 %   Lymphs Abs 2.1 0.7 - 4.0 K/uL   Monocytes Relative 7 %   Monocytes Absolute 0.6 0.1 - 1.0 K/uL   Eosinophils Relative 1 %   Eosinophils Absolute 0.1 0.0 - 0.5 K/uL   Basophils Relative 1 %   Basophils Absolute 0.1 0.0 - 0.1 K/uL   Immature Granulocytes 0 %   Abs Immature Granulocytes 0.02 0.00 - 0.07 K/uL    Comment: Performed at [facility] [address], [place], [place] [address]  Magnesium     Status: None   Collection Time: 03/20/22  5:18 PM  Result Value Ref Range   Magnesium 2.1 1.7 - 2.4 mg/dL    Comment: Performed at [facility] [address] [place], [place] [address]  I-stat chem 8, ED (not at [facility] or [facility])     Status: Abnormal   Collection Time: 03/20/22  5:41 PM  Result Value Ref Range   Sodium 136 135 - 145 mmol/L   Potassium 4.1 3.5 - 5.1 mmol/L   Chloride 103 98 - 111 mmol/L   BUN 14 6 - 20 mg/dL   Creatinine, Ser 0.80 0.61 - 1.24 mg/dL   Glucose, Bld 106 (H) 70 - 99 mg/dL    Comment: Glucose reference range applies only to samples taken after fasting for at least 8 hours.   Calcium, Ion 1.14 (L) 1.15 - 1.40 mmol/L   TCO2 24 22 - 32 mmol/L   Hemoglobin 13.3 13.0 - 17.0 g/dL   HCT 39.0 39.0 - 52.0 %   US RENAL  Result Date: 03/20/2022  CLINICAL DATA:  Acute kidney injury EXAM: RENAL / URINARY TRACT ULTRASOUND COMPLETE COMPARISON:  CT 08/14/2015 FINDINGS: Right  Kidney: Renal measurements: 8.4 x 4.7 x 4.9 cm = volume: 102.2 mL. Echogenic cortex. No hydronephrosis. Fewer than 10 cysts in the right kidney. The largest is seen at the upper pole and measures 21 mm. No follow-up imaging is recommended. Left Kidney: Renal measurements: 7.4 x 3.9 x 5 cm = volume: 75.5 mL. Echogenic cortex. No hydronephrosis. No mass. Bladder: Slightly thick walled in appearance. Prevoid bladder volume 203.5 mL. Patient was unable to void. Other: None. IMPRESSION: 1. Echogenic kidneys bilaterally consistent with medical renal disease. No hydronephrosis. 2. Nonspecific thick-walled appearance of the bladder Electronically Signed   By: Donavan Foil M.D.   On: 03/20/2022 22:18   DG Chest 2 View  Result Date: 03/20/2022 CLINICAL DATA:  Shortness of breath EXAM: CHEST - 2 VIEW COMPARISON:  04/21/2018 FINDINGS: The heart size and mediastinal contours are within normal limits. Both lungs are clear. The visualized skeletal structures are unremarkable. IMPRESSION: No active cardiopulmonary disease. Electronically Signed   By: Van Clines M.D.   On: 03/20/2022 18:20    Pending Labs Unresulted Labs (From admission, onward)     Start     Ordered   03/21/22 0500  CBC  Tomorrow morning,   R        03/20/22 2136   03/21/22 0500  Renal function panel  Tomorrow morning,   R        03/20/22 2136   03/21/22 0500  Magnesium  Tomorrow morning,   R        03/20/22 2136   03/20/22 2213  Hepatitis B surface antibody,qualitative  Once,   R        03/20/22 2212   03/20/22 2213  Hepatitis B surface antigen  Once,   R        03/20/22 2212   03/20/22 2213  Hepatitis C antibody  Once,   R        03/20/22 2212   03/20/22 2213  Hepatitis B core antibody, total  Once,   R        03/20/22 2212   03/20/22 2213  Na and K (sodium & potassium), rand urine  Once,   R        03/20/22 2212   03/20/22 2213  Creatinine, urine, random  Once,   R        03/20/22 2212   03/20/22 2212  Phosphorus  Once,   R         03/20/22 2212   03/20/22 2212  Parathyroid hormone, intact (no Ca)  Once,   R        03/20/22 2212   03/20/22 2212  Iron and TIBC  Once,   R        03/20/22 2212   03/20/22 2212  Ferritin  Once,   R        03/20/22 2212   03/20/22 2128  Rapid urine drug screen (hospital performed)  Once,   R        03/20/22 2127   03/20/22 2007  HIV Antibody (routine testing w rflx)  (HIV Antibody (Routine testing w reflex) panel)  Once,   R        03/20/22 2007   03/20/22 1942  CK  Once,   URGENT        03/20/22 1941            Vitals/Pain  Today's Vitals   03/20/22 1908 03/20/22 1930 03/20/22 2121 03/20/22 2145  BP: (!) 174/111 (!) 167/94  (!) 164/91  Pulse: 70 82  71  Resp: 17 19  16   Temp:   98.5 F (36.9 C)   TempSrc:   Oral   SpO2: 97% 98%  100%  PainSc:        Isolation Precautions No active isolations  Medications Medications  lactated ringers infusion ( Intravenous New Bag/Given 03/20/22 1954)  heparin injection 5,000 Units (5,000 Units Subcutaneous Given 03/20/22 2116)  amLODipine (NORVASC) tablet 10 mg (10 mg Oral Given 03/20/22 2116)  albuterol (PROVENTIL) (2.5 MG/3ML) 0.083% nebulizer solution 2.5 mg (has no administration in time range)  hydrALAZINE (APRESOLINE) injection 5 mg (has no administration in time range)  ondansetron (ZOFRAN) injection 4 mg (has no administration in time range)  melatonin tablet 5 mg (has no administration in time range)  polyethylene glycol (MIRALAX / GLYCOLAX) packet 17 g (has no administration in time range)  lactated ringers bolus 1,000 mL (0 mLs Intravenous Stopped 03/20/22 2118)    Mobility walks Low fall risk   Focused Assessments Renal Assessment Handoff:    R Recommendations: See Admitting Provider Note  Report given to:   Additional Notes:

## 2022-03-20 NOTE — Patient Instructions (Signed)
You have tardive dyskinesia, likely due to the [facility].  We will start ingrezza, 40 mg daily for one week and then increase to 80 mg daily  You need an appointment with community health and wellness, the primary care clinic.  You need to talk to your psychiatrist about your Lorayne Bender and have your mom attend the next appointment.  The physicians and staff at [facility] are committed to providing excellent care. You may receive a survey requesting feedback about your experience at our office. We strive to receive "very good" responses to the survey questions. If you feel that your experience would prevent you from giving the office a "very good " response, please contact our office to try to remedy the situation. We may be reached at [phone]. Thank you for taking the time out of your busy day to complete the survey.

## 2022-03-20 NOTE — Consult Note (Signed)
Reason for Consult: Renal failure Referring Physician:  Dr. Archie Patten hall  Chief Complaint: Abnormal labs  Assessment/Plan: AKI on CKDIV - patient almost certainly has had progression with creatinine already in the 2.9-3.1 range in 2019 with no PMD or close f/u for the renal disease. Presumable the renal failure is from poorly controlled HTN + chronic daily NSAID use (he states he stopped a few mths ago) but mother was bedside confirming he had been using Goody powder for many years.  Urine does not appear to be active. - Will f/u on the renal ultrasound to ensure he is not obstructed. If he has normal sized kidneys + no thinning of the cortex then may benefit from a renal biopsy but unfortunately the patient is likely now ESRD. Fortunately no uremic symptoms and certainly possibly there may be an acute component given NSAID use. Agree with isotonic fluid resuscitation as tolerated. Unofficially the kidneys are small -> RK 8.4cm and LK 7.4cm with no hydronephrosis noted, 234ml in the bladder. - Will check urine studies as well but agree with isotonic fluid resuscitation; he is tolerating. - I introduced the subject of dialysis to the pt and mother, the pt is resistant but the mother is familiar with dialysis. Kidney disease runs in the maternal side of the family (grandfather's family).  - No absolute indication for RRT tonight but I anticipate he will need to be initiated on RRT during this hospitalization.  -Monitor Daily I/Os, Daily weight  -Maintain MAP>65 for optimal renal perfusion.  - Avoid nephrotoxic agents such as IV contrast, NSAIDs, and phosphate containing bowel preps (FLEETS)  HTN - restart home regimen and will titrate meds as needed; may need to start HD + UF as well. Renal osteodystrophy - will check phos and PTH. 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   HPI: William Gregory is an 36 y.o.  male HTN, CKD3b, GERD, ETOH abuse in the past, MI in 2019,  on Invega for tardive dyskinesia seen by neurology for tremors; the tardive dyskinesia is thought to be secondary to antipsychotics. Patient has had tremors in the upper extremities + tongue for at least 3 years already starting before taking antipsychotics. Patient has had significant renal dysfunction with creatinine that was already in the 2.2-2.6 range in 2017 and then progressing to the 2.9-3.1 range in 2019. Now the patient referred for an abnormal creatinine of 9.5. Patient admits to NSAID use with Goody powder but denies fever, chills, shortness of breath, tarry stool, blood in the stool, nausea or vomiting.   Om the ED BP was 177/117 with a HR of 69, 96% saturations on RA with a BUN Cr of 46/9.54 initially. Unclear if there was a mixup as repeat showed a Cr of 0.8 and Hb had increased 2 points (likely a mistake). U/A showed small Hb with 0-5 RBC's and 100mg /dl of proteinuria on urinalysis with no WBC's. Renal ultrasound not resulted. CXR was unremarkable.   ROS Pertinent items are noted in HPI.  Chemistry and CBC: Creatinine, Ser  Date/Time Value Ref Range Status  03/20/2022 05:41 PM 0.80 0.61 - 1.24 mg/dL Final  03/20/2022 05:18 PM 10.27 (H) 0.61 - 1.24 mg/dL Final  03/20/2022 10:54 AM 9.54 (HH) 0.40 - 1.50 mg/dL Final  04/30/2018 01:04 AM 2.90 (H) 0.61 - 1.24 mg/dL Final  04/23/2018 04:11 AM 3.06 (H) 0.61 - 1.24 mg/dL Final  04/21/2018 07:33 PM 2.96 (H)  0.61 - 1.24 mg/dL Final  06/03/2017 09:17 PM 2.80 (H) 0.61 - 1.24 mg/dL Final  05/13/2017 02:58 PM 2.79 (H) 0.61 - 1.24 mg/dL Final  05/07/2017 10:22 PM 2.95 (H) 0.61 - 1.24 mg/dL Final  05/05/2016 04:59 AM 2.63 (H) 0.61 - 1.24 mg/dL Final  01/11/2016 12:24 AM 2.20 (H) 0.61 - 1.24 mg/dL Final  01/03/2016 09:55 PM 2.30 (H) 0.61 - 1.24 mg/dL Final  12/28/2015 07:03 PM 2.37 (H) 0.61 - 1.24 mg/dL Final  12/09/2015 10:53 PM 2.67 (H) 0.61 - 1.24 mg/dL Final  08/19/2015 06:59 PM  2.36 (H) 0.61 - 1.24 mg/dL Final  08/17/2015 09:40 AM 2.27 (H) 0.61 - 1.24 mg/dL Final  08/14/2015 04:19 AM 2.26 (H) 0.61 - 1.24 mg/dL Final  08/13/2015 11:13 PM 2.39 (H) 0.61 - 1.24 mg/dL Final  04/21/2009 03:08 AM 1.2 0.4 - 1.5 mg/dL Final  12/15/2008 08:08 PM 1.53 (H) 0.40 - 1.50 mg/dL Final  04/02/2008 09:16 PM 0.84 0.40 - 1.50 mg/dL Final   Recent Labs  Lab 03/20/22 1054 03/20/22 1718 03/20/22 1741  NA 140 138 136  K 4.0 3.7 4.1  CL 108 109 103  CO2 19 17*  --   GLUCOSE 102* 109* 106*  BUN 46* 46* 14  CREATININE 9.54* 10.27* 0.80  CALCIUM 9.9 9.6  --    Recent Labs  Lab 03/20/22 1054 03/20/22 1718 03/20/22 1741  WBC 7.9 8.9  --   NEUTROABS  --  6.0  --   HGB 11.2* 10.8* 13.3  HCT 33.5* 32.5* 39.0  MCV 89.1 91.8  --   PLT 299.0 286  --    Liver Function Tests: Recent Labs  Lab 03/20/22 1054 03/20/22 1718  AST 14 16  ALT 12 14  ALKPHOS 95 81  BILITOT 0.4 0.7  PROT 8.2 7.5  ALBUMIN 4.5 3.9   No results for input(s): "LIPASE", "AMYLASE" in the last 168 hours. No results for input(s): "AMMONIA" in the last 168 hours. Cardiac Enzymes: No results for input(s): "CKTOTAL", "CKMB", "CKMBINDEX", "TROPONINI" in the last 168 hours. Iron Studies: No results for input(s): "IRON", "TIBC", "TRANSFERRIN", "FERRITIN" in the last 72 hours. PT/INR: @LABRCNTIP (inr:5)  Xrays/Other Studies: ) Results for orders placed or performed during the hospital encounter of 03/20/22 (from the past 48 hour(s))  Urinalysis, Routine w reflex microscopic     Status: Abnormal   Collection Time: 03/20/22  5:13 PM  Result Value Ref Range   Color, Urine STRAW (A) YELLOW   APPearance HAZY (A) CLEAR   Specific Gravity, Urine 1.008 1.005 - 1.030   pH 5.0 5.0 - 8.0   Glucose, UA NEGATIVE NEGATIVE mg/dL   Hgb urine dipstick SMALL (A) NEGATIVE   Bilirubin Urine NEGATIVE NEGATIVE   Ketones, ur NEGATIVE NEGATIVE mg/dL   Protein, ur 100 (A) NEGATIVE mg/dL   Nitrite NEGATIVE NEGATIVE    Leukocytes,Ua NEGATIVE NEGATIVE   RBC / HPF 0-5 0 - 5 RBC/hpf   WBC, UA 0-5 0 - 5 WBC/hpf   Bacteria, UA RARE (A) NONE SEEN   Squamous Epithelial / LPF 0-5 0 - 5   Mucus PRESENT    Sperm, UA PRESENT     Comment: Performed at [facility], [address] [place], [place] [address]  Comprehensive metabolic panel     Status: Abnormal   Collection Time: 03/20/22  5:18 PM  Result Value Ref Range   Sodium 138 135 - 145 mmol/L   Potassium 3.7 3.5 - 5.1 mmol/L   Chloride 109 98 -  111 mmol/L   CO2 17 (L) 22 - 32 mmol/L   Glucose, Bld 109 (H) 70 - 99 mg/dL    Comment: Glucose reference range applies only to samples taken after fasting for at least 8 hours.   BUN 46 (H) 6 - 20 mg/dL   Creatinine, Ser 10.27 (H) 0.61 - 1.24 mg/dL   Calcium 9.6 8.9 - 10.3 mg/dL   Total Protein 7.5 6.5 - 8.1 g/dL   Albumin 3.9 3.5 - 5.0 g/dL   AST 16 15 - 41 U/L   ALT 14 0 - 44 U/L   Alkaline Phosphatase 81 38 - 126 U/L   Total Bilirubin 0.7 0.3 - 1.2 mg/dL   GFR, Estimated 6 (L) >60 mL/min    Comment: (NOTE) Calculated using the CKD-EPI Creatinine Equation (2021)    Anion gap 12 5 - 15    Comment: Performed at [facility] [address], [place], [place] [address]  CBC with Differential     Status: Abnormal   Collection Time: 03/20/22  5:18 PM  Result Value Ref Range   WBC 8.9 4.0 - 10.5 K/uL   RBC 3.54 (L) 4.22 - 5.81 MIL/uL   Hemoglobin 10.8 (L) 13.0 - 17.0 g/dL   HCT 32.5 (L) 39.0 - 52.0 %   MCV 91.8 80.0 - 100.0 fL   MCH 30.5 26.0 - 34.0 pg   MCHC 33.2 30.0 - 36.0 g/dL   RDW 13.7 11.5 - 15.5 %   Platelets 286 150 - 400 K/uL   nRBC 0.0 0.0 - 0.2 %   Neutrophils Relative % 67 %   Neutro Abs 6.0 1.7 - 7.7 K/uL   Lymphocytes Relative 24 %   Lymphs Abs 2.1 0.7 - 4.0 K/uL   Monocytes Relative 7 %   Monocytes Absolute 0.6 0.1 - 1.0 K/uL   Eosinophils Relative 1 %   Eosinophils Absolute 0.1 0.0 - 0.5 K/uL   Basophils Relative 1 %   Basophils Absolute 0.1 0.0 - 0.1 K/uL   Immature  Granulocytes 0 %   Abs Immature Granulocytes 0.02 0.00 - 0.07 K/uL    Comment: Performed at [facility] [address] [place], [place] [address]  Magnesium     Status: None   Collection Time: 03/20/22  5:18 PM  Result Value Ref Range   Magnesium 2.1 1.7 - 2.4 mg/dL    Comment: Performed at [facility] [address] [place],  [address]  I-stat chem 8, ED (not at [facility] or [facility])     Status: Abnormal   Collection Time: 03/20/22  5:41 PM  Result Value Ref Range   Sodium 136 135 - 145 mmol/L   Potassium 4.1 3.5 - 5.1 mmol/L   Chloride 103 98 - 111 mmol/L   BUN 14 6 - 20 mg/dL   Creatinine, Ser 0.80 0.61 - 1.24 mg/dL   Glucose, Bld 106 (H) 70 - 99 mg/dL    Comment: Glucose reference range applies only to samples taken after fasting for at least 8 hours.   Calcium, Ion 1.14 (L) 1.15 - 1.40 mmol/L   TCO2 24 22 - 32 mmol/L   Hemoglobin 13.3 13.0 - 17.0 g/dL   HCT 39.0 39.0 - 52.0 %   DG Chest 2 View  Result Date: 03/20/2022 CLINICAL DATA:  Shortness of breath EXAM: CHEST - 2 VIEW COMPARISON:  04/21/2018 FINDINGS: The heart size and mediastinal contours are within normal limits. Both lungs are clear. The visualized  skeletal structures are unremarkable. IMPRESSION: No active cardiopulmonary disease. Electronically Signed   By: Van Clines M.D.   On: 03/20/2022 18:20    PMH:   Past Medical History:  Diagnosis Date   Alcohol abuse    Asthma    Back pain    Depression    Knee joint pain, left    Neck pain    Psychiatric problem    unspecified by patient (receives "injections" bi-weekly)   Tobacco abuse     PSH:   Past Surgical History:  Procedure Laterality Date   arm surgery      Allergies:  Allergies  Allergen Reactions   Aspirin Swelling   Ibuprofen Rash    Medications:   Prior to Admission medications   Medication Sig Start Date End Date Taking? Authorizing Provider  acetaminophen (TYLENOL) 500 MG tablet Take 500 mg by mouth every 6 (six)  hours as needed for moderate pain.   Yes [provider]  albuterol (VENTOLIN HFA) 108 (90 Base) MCG/ACT inhaler Inhale 2 puffs into the lungs every 4 (four) hours as needed for wheezing or shortness of breath. 02/12/22  Yes Barrett Henle, MD  amLODipine (NORVASC) 5 MG tablet Take 1 tablet (5 mg total) by mouth daily. 02/12/22  Yes Barrett Henle, MD  paliperidone Palmitate ER (INVEGA TRINZA) 819 MG/2.63ML injection Inject 819 mg into the muscle every 3 (three) months.   Yes [provider]    Discontinued Meds:   Medications Discontinued During This Encounter  Medication Reason   omeprazole (PRILOSEC) 20 MG capsule Patient Preference   sucralfate (CARAFATE) 1 g tablet Patient Preference   citalopram (CELEXA) 20 MG tablet Patient Preference   valbenazine (INGREZZA) 40 MG capsule Patient Preference   valbenazine (INGREZZA) 80 MG capsule Patient Preference   albuterol (VENTOLIN HFA) 108 (90 Base) MCG/ACT inhaler 2 puff Cost of medication    Social History:  reports that he has been smoking cigarettes. He has been smoking an average of 1 pack per day. He has never used smokeless tobacco. He reports that he does not currently use alcohol. He reports current drug use. Drugs: Cocaine and Marijuana.  Family History:   Family History  Problem Relation Age of Onset   Diabetes Mother    Diabetes Father    Dementia Maternal Grandmother     Blood pressure (!) 167/94, pulse 82, temperature 98.5 F (36.9 C), temperature source Oral, resp. rate 19, SpO2 98 %. General appearance: alert, cooperative, and appears stated age Head: Normocephalic, without obvious abnormality, atraumatic Eyes: conjunctivae/corneas clear. PERRL, EOM's intact. Fundi benign. Neck: no adenopathy, no carotid bruit, no JVD, supple, symmetrical, trachea midline, and thyroid not enlarged, symmetric, no tenderness/mass/nodules Back: symmetric, no curvature. ROM normal. No CVA tenderness. Resp: clear to  auscultation bilaterally Cardio: regular rate and rhythm, S1, S2 normal, no murmur, click, rub or gallop GI: soft, non-tender; bowel sounds normal; no masses,  no organomegaly Extremities: extremities normal, atraumatic, no cyanosis or edema Pulses: 2+ and symmetric Skin: Skin color, texture, turgor normal. No rashes or lesions Neurologic: tremors, tardive dyskinesia       Javontae Marlette, Hunt Oris, MD 03/20/2022, 9:33 PM

## 2022-03-20 NOTE — ED Provider Triage Note (Signed)
Emergency Medicine Provider Triage Evaluation Note  William Gregory , a 36 y.o. male  was evaluated in triage.  Patient went to PCP today because he was feeling shaky.  They checked his labs and noted that he was in kidney failure and sent here.  Reports some shortness of breath from bronchitis however no leg swelling or abdominal distention.  No history of hypertension or diabetes.  Review of Systems  Positive:  Negative:   Physical Exam  BP (!) 177/117 (BP Location: Right Arm)   Pulse 69   Temp 98.7 F (37.1 C) (Oral)   Resp 16   SpO2 96%  Gen:   Awake, no distress   Resp:  Normal effort  MSK:   Moves extremities without difficulty  Other:  Mildly distended abdomen.  Crackles in left upper lung field.  Ambulatory, no extremity swelling.  Medical Decision Making  Medically screening exam initiated at 5:11 PM.  Appropriate orders placed.  William Gregory was informed that the remainder of the evaluation will be completed by another provider, this initial triage assessment does not replace that evaluation, and the importance of remaining in the ED until their evaluation is complete.  Charge and triage nurse made aware that patient needs room sooner rather than later 2/2 Cr 9. K stable   Carliss Porcaro A, PA-C 03/20/22 1713

## 2022-03-20 NOTE — ED Provider Notes (Signed)
[place] EMERGENCY DEPARTMENT Provider Note   CSN: [id number] Arrival date & time: 03/20/22  1538     History {Add pertinent medical, surgical, social history, OB history to HPI:1} Chief Complaint  Patient presents with   Abnormal Lab    William Gregory is a 36 y.o. male.   Abnormal Lab Patient presents for***.  Medical history includes alcohol abuse, depression, asthma, GERD. He went to a neurology appointment today for tremors.  These have reportedly been going on for the past several years.  Neurology felt that this was likely secondary to tardive dyskinesia.  Patient does have history of psychiatric illnesses and has been on [place].  He was advised to start [place].  While there, laboratory work-up was performed.  He was informed that he is in kidney failure and advised to come to the ED.     Home Medications Prior to Admission medications   Medication Sig Start Date End Date Taking? Authorizing Provider  albuterol (VENTOLIN HFA) 108 (90 Base) MCG/ACT inhaler Inhale 2 puffs into the lungs every 4 (four) hours as needed for wheezing or shortness of breath. 02/12/22   Barrett Henle, MD  amLODipine (NORVASC) 5 MG tablet Take 1 tablet (5 mg total) by mouth daily. 02/12/22   Barrett Henle, MD  citalopram (CELEXA) 20 MG tablet Take 20 mg by mouth daily.    [provider]  omeprazole (PRILOSEC) 20 MG capsule Take 1 capsule (20 mg total) by mouth 2 (two) times daily before a meal. 04/30/18   Larene Pickett, PA-C  paliperidone Palmitate ER (INVEGA TRINZA) 819 MG/2.63ML injection Inject 819 mg into the muscle every 3 (three) months.    [provider]  sucralfate (CARAFATE) 1 g tablet Take 1 tablet (1 g total) by mouth 4 (four) times daily -  with meals and at bedtime. Patient not taking: Reported on 03/20/2022 04/30/18   Larene Pickett, PA-C  valbenazine [facility]) 40 MG capsule Samples of this drug were given to the patient,  quantity 1, Lot Number [id number] Exp 05/05/2024 03/20/22   Tat, Eustace Quail, DO  valbenazine [facility]) 80 MG capsule Samples of this drug were given to the patient, quantity 2, Lot Number [id number] 03/20/22   Tat, Eustace Quail, DO      Allergies    Aspirin and Ibuprofen    Review of Systems   Review of Systems  Physical Exam Updated Vital Signs BP (!) 177/117 (BP Location: Right Arm)   Pulse 69   Temp 98.7 F (37.1 C) (Oral)   Resp 16   SpO2 96%  Physical Exam  ED Results / Procedures / Treatments   Labs (all labs ordered are listed, but only abnormal results are displayed) Labs Reviewed  COMPREHENSIVE METABOLIC PANEL - Abnormal; Notable for the following components:      Result Value   CO2 17 (*)    Glucose, Bld 109 (*)    BUN 46 (*)    Creatinine, Ser 10.27 (*)    GFR, Estimated 6 (*)    All other components within normal limits  CBC WITH DIFFERENTIAL/PLATELET - Abnormal; Notable for the following components:   RBC 3.54 (*)    Hemoglobin 10.8 (*)    HCT 32.5 (*)    All other components within normal limits  URINALYSIS, ROUTINE W REFLEX MICROSCOPIC - Abnormal; Notable for the following components:   Color, Urine STRAW (*)    APPearance HAZY (*)    Hgb urine  dipstick SMALL (*)    Protein, ur 100 (*)    Bacteria, UA RARE (*)    All other components within normal limits  I-STAT CHEM 8, ED - Abnormal; Notable for the following components:   Glucose, Bld 106 (*)    Calcium, Ion 1.14 (*)    All other components within normal limits  MAGNESIUM    EKG None  Radiology DG Chest 2 View  Result Date: 03/20/2022 CLINICAL DATA:  Shortness of breath EXAM: CHEST - 2 VIEW COMPARISON:  04/21/2018 FINDINGS: The heart size and mediastinal contours are within normal limits. Both lungs are clear. The visualized skeletal structures are unremarkable. IMPRESSION: No active cardiopulmonary disease. Electronically Signed   By: Van Clines M.D.   On: 03/20/2022 18:20     Procedures Procedures  {Document cardiac monitor, telemetry assessment procedure when appropriate:1}  Medications Ordered in ED Medications - No data to display  ED Course/ Medical Decision Making/ A&P                           Medical Decision Making  This patient presents to the ED for concern of ***, this involves an extensive number of treatment options, and is a complaint that carries with it a high risk of complications and morbidity.  The differential diagnosis includes ***   Co morbidities that complicate the patient evaluation  ***   Additional history obtained:  Additional history obtained from *** External records from outside source obtained and reviewed including ***   Lab Tests:  I Ordered, and personally interpreted labs.  The pertinent results include:  ***   Imaging Studies ordered:  I ordered imaging studies including ***  I independently visualized and interpreted imaging which showed *** I agree with the radiologist interpretation   Cardiac Monitoring: / EKG:  The patient was maintained on a cardiac monitor.  I personally viewed and interpreted the cardiac monitored which showed an underlying rhythm of: ***   Consultations Obtained:  I requested consultation with the nephrologist, Dr. Augustin Coupe,  and discussed lab and imaging findings as well as pertinent plan - they recommend: Admission for further work-up.  They will follow in consult.  Dr. Augustin Coupe will see him tonight.   Problem List / ED Course / Critical interventions / Medication management  *** I ordered medication including ***  for ***  Reevaluation of the patient after these medicines showed that the patient {resolved/improved/worsened:23923::"improved"} I have reviewed the patients home medicines and have made adjustments as needed   Social Determinants of Health:  ***   Test / Admission - Considered:  ***   {Document critical care time when appropriate:1} {Document review of  labs and clinical decision tools ie heart score, Chads2Vasc2 etc:1}  {Document your independent review of radiology images, and any outside records:1} {Document your discussion with family members, caretakers, and with consultants:1} {Document social determinants of health affecting pt's care:1} {Document your decision making why or why not admission, treatments were needed:1} Final Clinical Impression(s) / ED Diagnoses Final diagnoses:  None    Rx / DC Orders ED Discharge Orders     None

## 2022-03-20 NOTE — ED Triage Notes (Signed)
Patient states he was told to go to ED for evaluation by his PCP due to kidney failure. Patient went to PCP initially for evaluation of feeling shakey for several months. Patient denies pain ,patient is alert, oriented, ambulating independently with steady gait and is in no apparent distress at this time.

## 2022-03-20 NOTE — Telephone Encounter (Signed)
I also called and verified with patient

## 2022-03-20 NOTE — H&P (Signed)
History and Physical  BERLIN MOKRY ZTI:[id number] DOB: 02/02/1986 DOA: 03/20/2022  Referring physician: Dr. Doren Custard, [place]  PCP: Patient, No Pcp Per  Outpatient Specialists: Neurology, psychiatry. Patient coming from: Home.  Chief Complaint: Abnormal labs.  HPI: William Gregory is a 36 y.o. male with medical history significant for tardive dyskinesia on injectable Invega, essential hypertension, CKD 3B, history of polysubstance abuse including cocaine, who initially went to his first neurologist appointment today due to severe upper extremities tremors while on antipsychotics.  Labs were drawn.  Results revealed worsening renal failure with creatinine greater than 9 and GFR of 6, from baseline creatinine of 2.9 and GFR of 31.  The patient was advised to go to the ED for further management.  In the ED, repeated creatinine 10.2.  EDP discussed the case with nephrology on-call who will see the patient in consultation.  Recommended IV fluid LR 125 cc/h.  Patient admitted to past use of NSAIDs and Goody powder.  Denies abdominal pain, melena, or overt bleeding.  Denies anuria, nausea or vomiting.  Admitted by [facility], hospitalist service.  ED Course: Tmax 98.7.  BP 174/111, pulse 70, respiratory rate 17, O2 saturation 97-98% on room air.  Lab studies remarkable for serum bicarb 17, glucose 109, BUN 46, creatinine 10.27, GFR 6.  Hemoglobin 10.8.  Review of Systems: Review of systems as noted in the HPI. All other systems reviewed and are negative.   Past Medical History:  Diagnosis Date   Alcohol abuse    Asthma    Back pain    Depression    Knee joint pain, left    Neck pain    Psychiatric problem    unspecified by patient (receives "injections" bi-weekly)   Tobacco abuse    Past Surgical History:  Procedure Laterality Date   arm surgery      Social History:  reports that he has been smoking cigarettes. He has been smoking an average of 1 pack per day. He has never used smokeless  tobacco. He reports that he does not currently use alcohol. He reports current drug use. Drugs: Cocaine and Marijuana.   Allergies  Allergen Reactions   Aspirin Swelling   Ibuprofen Rash    Family History  Problem Relation Age of Onset   Diabetes Mother    Diabetes Father    Dementia Maternal Grandmother       Prior to Admission medications   Medication Sig Start Date End Date Taking? Authorizing Provider  acetaminophen (TYLENOL) 500 MG tablet Take 500 mg by mouth every 6 (six) hours as needed for moderate pain.   Yes [provider]  albuterol (VENTOLIN HFA) 108 (90 Base) MCG/ACT inhaler Inhale 2 puffs into the lungs every 4 (four) hours as needed for wheezing or shortness of breath. 02/12/22  Yes Barrett Henle, MD  amLODipine (NORVASC) 5 MG tablet Take 1 tablet (5 mg total) by mouth daily. 02/12/22  Yes Barrett Henle, MD  paliperidone Palmitate ER (INVEGA TRINZA) 819 MG/2.63ML injection Inject 819 mg into the muscle every 3 (three) months.   Yes [provider]    Physical Exam: BP (!) 174/111   Pulse 70   Temp 98.7 F (37.1 C) (Oral)   Resp 17   SpO2 97%   General: 36 y.o. year-old male well developed well nourished in no acute distress.  Alert and oriented x3. Cardiovascular: Regular rate and rhythm with no rubs or gallops.  No thyromegaly or JVD noted.  Trace lower extremity  edema. 2/4 pulses in all 4 extremities. Respiratory: Clear to auscultation with no wheezes or rales. Good inspiratory effort. Abdomen: Soft nontender nondistended with normal bowel sounds x4 quadrants. Muskuloskeletal: No cyanosis or clubbing.  Trace edema noted bilaterally Neuro: CN II-XII intact, strength, sensation, reflexes Skin: No ulcerative lesions noted or rashes Psychiatry: Judgement and insight appear normal. Mood is appropriate for condition and setting          Labs on Admission:  Basic Metabolic Panel: Recent Labs  Lab 03/20/22 1054 03/20/22 1718  03/20/22 1741  NA 140 138 136  K 4.0 3.7 4.1  CL 108 109 103  CO2 19 17*  --   GLUCOSE 102* 109* 106*  BUN 46* 46* 14  CREATININE 9.54* 10.27* 0.80  CALCIUM 9.9 9.6  --   MG  --  2.1  --    Liver Function Tests: Recent Labs  Lab 03/20/22 1054 03/20/22 1718  AST 14 16  ALT 12 14  ALKPHOS 95 81  BILITOT 0.4 0.7  PROT 8.2 7.5  ALBUMIN 4.5 3.9   No results for input(s): "LIPASE", "AMYLASE" in the last 168 hours. No results for input(s): "AMMONIA" in the last 168 hours. CBC: Recent Labs  Lab 03/20/22 1054 03/20/22 1718 03/20/22 1741  WBC 7.9 8.9  --   NEUTROABS  --  6.0  --   HGB 11.2* 10.8* 13.3  HCT 33.5* 32.5* 39.0  MCV 89.1 91.8  --   PLT 299.0 286  --    Cardiac Enzymes: No results for input(s): "CKTOTAL", "CKMB", "CKMBINDEX", "TROPONINI" in the last 168 hours.  BNP (last 3 results) No results for input(s): "BNP" in the last 8760 hours.  ProBNP (last 3 results) No results for input(s): "PROBNP" in the last 8760 hours.  CBG: No results for input(s): "GLUCAP" in the last 168 hours.  Radiological Exams on Admission: DG Chest 2 View  Result Date: 03/20/2022 CLINICAL DATA:  Shortness of breath EXAM: CHEST - 2 VIEW COMPARISON:  04/21/2018 FINDINGS: The heart size and mediastinal contours are within normal limits. Both lungs are clear. The visualized skeletal structures are unremarkable. IMPRESSION: No active cardiopulmonary disease. Electronically Signed   By: Van Clines M.D.   On: 03/20/2022 18:20    EKG: I independently viewed the EKG done and my findings are as followed: Sinus rhythm rate of 68.  Nonspecific ST-T changes.  QTc 387.  Assessment/Plan Present on Admission:  AKI (acute kidney injury) ([place])  Principal Problem:   AKI (acute kidney injury) ([place])  AKI on CKD 3B. Suspect multifactorial, dehydration, chronic NSAID use. At baseline creatinine 2.9 with GFR of 31. Presented with creatinine of 10.2 with GFR of 6. Monitor urine output with  strict I's and O's. Continue IV fluid as recommended by nephrology LR at 125 cc/h  Avoid hepatotoxic agents, dehydration and hypotension. Avoid NSAIDs. We will obtain a renal ultrasound.  Non anion gap metabolic acidosis in the setting of renal failure Presented with serum bicarb of 17, anion gap 12 Continue IV fluid hydration. Repeat chemistry panel in the morning.  Essential hypertension, BP is not at goal, elevated On home Norvasc 5 mg daily, increased dose to 10 mg daily. IV antihypertensives, hydralazine, as needed with parameters Monitor vital signs.  Anemia of chronic disease with advanced CKD Hemoglobin on presentation 10.8. No overt bleeding reported. Monitor for now.  Severe morbid obesity BMI 42 Recommend weight loss outpatient with regular physical activity and healthy dieting.  History of polysubstance abuse including cocaine  Obtain UDS     Critical care time: 65 minutes.    DVT prophylaxis: Subcu heparin 3 times daily  Code Status: Full code  Family Communication: None at bedside  Disposition Plan: Admitted to telemetry medical unit  Consults called: Nephrology consulted by EDP  Admission status: Inpatient status.   Status is: Inpatient The patient requires at least 2 midnights for further evaluation and treatment of present condition.   Kayleen Memos MD [facility] Pager [phone]  If 7PM-7AM, please contact night-coverage [email or web address] Password [email or web address]  03/20/2022, 8:08 PM

## 2022-03-20 NOTE — Telephone Encounter (Signed)
Called patients mother to get patient to the ED immediately with a creatine level of 9.54. Spoke to mother and she is taking patient to the ED immediately.

## 2022-03-20 NOTE — ED Notes (Signed)
Lab called to add on CK 

## 2022-03-20 NOTE — ED Notes (Signed)
Pt ambulatory to bathroom with steady gait.

## 2022-03-20 NOTE — Telephone Encounter (Signed)
Reviewed chart.  Long hx of renal insuff.  Was told 3 years ago to f/u for it but looks like hasn't and now Cr>9 and GFR6.  He and mother were contacted separately and pt was on way to ER by the time we talked with patient.  Told them to hold the ingrezza and not take it yet although package info states no dose adjustment required for severe renal impairment.  Will address once out of hospital.

## 2022-03-21 ENCOUNTER — Encounter (HOSPITAL_COMMUNITY): Payer: Self-pay | Admitting: Internal Medicine

## 2022-03-21 DIAGNOSIS — N179 Acute kidney failure, unspecified: Secondary | ICD-10-CM | POA: Diagnosis not present

## 2022-03-21 LAB — IRON AND TIBC
Iron: 44 ug/dL — ABNORMAL LOW (ref 45–182)
Saturation Ratios: 17 % — ABNORMAL LOW (ref 17.9–39.5)
TIBC: 255 ug/dL (ref 250–450)
UIBC: 211 ug/dL

## 2022-03-21 LAB — CBC
HCT: 28.8 % — ABNORMAL LOW (ref 39.0–52.0)
Hemoglobin: 9.6 g/dL — ABNORMAL LOW (ref 13.0–17.0)
MCH: 29.8 pg (ref 26.0–34.0)
MCHC: 33.3 g/dL (ref 30.0–36.0)
MCV: 89.4 fL (ref 80.0–100.0)
Platelets: 258 10*3/uL (ref 150–400)
RBC: 3.22 MIL/uL — ABNORMAL LOW (ref 4.22–5.81)
RDW: 13.7 % (ref 11.5–15.5)
WBC: 9.7 10*3/uL (ref 4.0–10.5)
nRBC: 0 % (ref 0.0–0.2)

## 2022-03-21 LAB — HIV ANTIBODY (ROUTINE TESTING W REFLEX): HIV Screen 4th Generation wRfx: NONREACTIVE

## 2022-03-21 LAB — HEPATITIS B SURFACE ANTIGEN
Hepatitis B Surface Ag: NONREACTIVE
Hepatitis B Surface Ag: NONREACTIVE

## 2022-03-21 LAB — RAPID URINE DRUG SCREEN, HOSP PERFORMED
Amphetamines: NOT DETECTED
Barbiturates: NOT DETECTED
Benzodiazepines: NOT DETECTED
Cocaine: NOT DETECTED
Opiates: NOT DETECTED
Tetrahydrocannabinol: NOT DETECTED

## 2022-03-21 LAB — RENAL FUNCTION PANEL
Albumin: 3.4 g/dL — ABNORMAL LOW (ref 3.5–5.0)
Anion gap: 11 (ref 5–15)
BUN: 46 mg/dL — ABNORMAL HIGH (ref 6–20)
CO2: 18 mmol/L — ABNORMAL LOW (ref 22–32)
Calcium: 9.1 mg/dL (ref 8.9–10.3)
Chloride: 110 mmol/L (ref 98–111)
Creatinine, Ser: 9.47 mg/dL — ABNORMAL HIGH (ref 0.61–1.24)
GFR, Estimated: 7 mL/min — ABNORMAL LOW (ref >60)
Glucose, Bld: 92 mg/dL (ref 70–99)
Phosphorus: 6.4 mg/dL — ABNORMAL HIGH (ref 2.5–4.6)
Potassium: 3.7 mmol/L (ref 3.5–5.1)
Sodium: 139 mmol/L (ref 135–145)

## 2022-03-21 LAB — CK: Total CK: 572 U/L — ABNORMAL HIGH (ref 49–397)

## 2022-03-21 LAB — FERRITIN: Ferritin: 146 ng/mL (ref 24–336)

## 2022-03-21 LAB — HEPATITIS C ANTIBODY: HCV Ab: NONREACTIVE

## 2022-03-21 LAB — HEPATITIS B CORE ANTIBODY, TOTAL: Hep B Core Total Ab: NONREACTIVE

## 2022-03-21 LAB — HEPATITIS B SURFACE ANTIBODY,QUALITATIVE: Hep B S Ab: NONREACTIVE

## 2022-03-21 LAB — PHOSPHORUS: Phosphorus: 6.5 mg/dL — ABNORMAL HIGH (ref 2.5–4.6)

## 2022-03-21 LAB — NA AND K (SODIUM & POTASSIUM), RAND UR
Potassium Urine: 11 mmol/L
Sodium, Ur: 68 mmol/L

## 2022-03-21 LAB — MAGNESIUM: Magnesium: 2.1 mg/dL (ref 1.7–2.4)

## 2022-03-21 LAB — CREATININE, URINE, RANDOM: Creatinine, Urine: 49 mg/dL

## 2022-03-21 MED ORDER — CHLORHEXIDINE GLUCONATE CLOTH 2 % EX PADS: 6.0000 | MEDICATED_PAD | Freq: Every day | CUTANEOUS | Status: DC

## 2022-03-21 MED ORDER — SODIUM CHLORIDE 0.9 % IV SOLN: 200.0000 mg | INTRAVENOUS | Status: DC

## 2022-03-21 MED ORDER — CEFAZOLIN SODIUM-DEXTROSE 2-4 GM/100ML-% IV SOLN: 2.0000 g | INTRAVENOUS | Status: DC

## 2022-03-21 NOTE — Progress Notes (Addendum)
Pt states he is not going receive any more fluids. Dr Verlon Au notified. Pt educated.

## 2022-03-21 NOTE — Consult Note (Signed)
Chief Complaint: Patient was seen in consultation today for tunneled hemodialysis catheter placement Chief Complaint  Patient presents with   Abnormal Lab   at the request of Dr Loyal Gambler  Supervising Physician: Daryll Brod  Patient Status: [facility] - In-pt  History of Present Illness: William Gregory is a 36 y.o. male   AKI/CKD vs progression to ESRD Nephrology requesting tunneled dialysis cath for initiation of dialysis Tardive dyskinesia  Dr Candiss Norse note today: AKI on CKDIV vs progression to ESRD - patient almost certainly has had progression with creatinine already in the 2.9-3.1 range in 2019 with no PMD or close f/u for the renal disease. Presumable the renal failure is from poorly controlled HTN + chronic daily NSAID use (he states he stopped a few mths ago) but mother was bedside confirming he had been using Goody powder for many years.  Urine does not appear to be active. -at this junction would hold off on biopsy as management will not likely change especially with the presence of small kidneys. -We discussed RRT/IHD and patient is receptive to this now and does agree to start. Will consult IR for [facility] placement, will advance conversations slowly about more permanent access (AVF/AVG). Will plan to start IHD#1 once catheter is in -CLIP for outpatient placement  Scheduled for IR HD tunneled catheter placement 10/18  Past Medical History:  Diagnosis Date   Alcohol abuse    Asthma    Back pain    Depression    Knee joint pain, left    Neck pain    Psychiatric problem    unspecified by patient (receives "injections" bi-weekly)   Tobacco abuse     Past Surgical History:  Procedure Laterality Date   arm surgery      Allergies: Aspirin and Ibuprofen  Medications: Prior to Admission medications   Medication Sig Start Date End Date Taking? Authorizing Provider  acetaminophen (TYLENOL) 500 MG tablet Take 500 mg by mouth every 6 (six) hours as needed for moderate  pain.   Yes [provider]  albuterol (VENTOLIN HFA) 108 (90 Base) MCG/ACT inhaler Inhale 2 puffs into the lungs every 4 (four) hours as needed for wheezing or shortness of breath. 02/12/22  Yes Barrett Henle, MD  amLODipine (NORVASC) 5 MG tablet Take 1 tablet (5 mg total) by mouth daily. 02/12/22  Yes Barrett Henle, MD  paliperidone Palmitate ER (INVEGA TRINZA) 819 MG/2.63ML injection Inject 819 mg into the muscle every 3 (three) months.   Yes [provider]     Family History  Problem Relation Age of Onset   Diabetes Mother    Diabetes Father    Dementia Maternal Grandmother     Social History   Socioeconomic History   Marital status: Single    Spouse name: Not on file   Number of children: Not on file   Years of education: Not on file   Highest education level: Not on file  Occupational History   Not on file  Tobacco Use   Smoking status: Every Day    Packs/day: 1.00    Types: Cigarettes   Smokeless tobacco: Never  Vaping Use   Vaping Use: Former  Substance and Sexual Activity   Alcohol use: Not Currently    Comment: sober x "months"   Drug use: Yes    Types: Cocaine, Marijuana    Comment: Hasn't used cocaine in "months"   Sexual activity: Not on file  Other Topics Concern   Not on  file  Social History Narrative   Right handed   Unemployed/disability   Ninth grade level of education   Social Determinants of Health   Financial Resource Strain: Not on file  Food Insecurity: Food Insecurity Present (03/20/2022)   Hunger Vital Sign    Worried About Running Out of Food in the Last Year: Sometimes true    Ran Out of Food in the Last Year: Patient refused  Transportation Needs: Unknown (03/20/2022)   PRAPARE - Hydrologist (Medical): Patient refused    Lack of Transportation (Non-Medical): Patient refused  Physical Activity: Not on file  Stress: Not on file  Social Connections: Not on file    Review of  Systems: A 12 point ROS discussed and pertinent positives are indicated in the HPI above.  All other systems are negative.  Vital Signs: BP (!) 141/59   Pulse 83   Temp 98.4 F (36.9 C) (Oral)   Resp 17   SpO2 98%     Physical Exam Vitals reviewed.  Cardiovascular:     Rate and Rhythm: Normal rate and regular rhythm.     Heart sounds: Normal heart sounds.  Pulmonary:     Effort: Pulmonary effort is normal.     Breath sounds: Normal breath sounds.  Abdominal:     Palpations: Abdomen is soft.  Musculoskeletal:        General: Normal range of motion.  Skin:    General: Skin is warm.  Neurological:     Mental Status: He is alert and oriented to person, place, and time.  Psychiatric:        Behavior: Behavior normal.     Imaging: US RENAL  Result Date: 03/20/2022 CLINICAL DATA:  Acute kidney injury EXAM: RENAL / URINARY TRACT ULTRASOUND COMPLETE COMPARISON:  CT 08/14/2015 FINDINGS: Right Kidney: Renal measurements: 8.4 x 4.7 x 4.9 cm = volume: 102.2 mL. Echogenic cortex. No hydronephrosis. Fewer than 10 cysts in the right kidney. The largest is seen at the upper pole and measures 21 mm. No follow-up imaging is recommended. Left Kidney: Renal measurements: 7.4 x 3.9 x 5 cm = volume: 75.5 mL. Echogenic cortex. No hydronephrosis. No mass. Bladder: Slightly thick walled in appearance. Prevoid bladder volume 203.5 mL. Patient was unable to void. Other: None. IMPRESSION: 1. Echogenic kidneys bilaterally consistent with medical renal disease. No hydronephrosis. 2. Nonspecific thick-walled appearance of the bladder Electronically Signed   By: Donavan Foil M.D.   On: 03/20/2022 22:18   DG Chest 2 View  Result Date: 03/20/2022 CLINICAL DATA:  Shortness of breath EXAM: CHEST - 2 VIEW COMPARISON:  04/21/2018 FINDINGS: The heart size and mediastinal contours are within normal limits. Both lungs are clear. The visualized skeletal structures are unremarkable. IMPRESSION: No active  cardiopulmonary disease. Electronically Signed   By: Van Clines M.D.   On: 03/20/2022 18:20    Labs:  CBC: Recent Labs    03/20/22 1054 03/20/22 1718 03/20/22 1741 03/21/22 0019  WBC 7.9 8.9  --  9.7  HGB 11.2* 10.8* 13.3 9.6*  HCT 33.5* 32.5* 39.0 28.8*  PLT 299.0 286  --  258    COAGS: No results for input(s): "INR", "APTT" in the last 8760 hours.  BMP: Recent Labs    03/20/22 1054 03/20/22 1718 03/20/22 1741 03/21/22 0019  NA 140 138 136 139  K 4.0 3.7 4.1 3.7  CL 108 109 103 110  CO2 19 17*  --  18*  GLUCOSE 102* 109*  106* 92  BUN 46* 46* 14 46*  CALCIUM 9.9 9.6  --  9.1  CREATININE 9.54* 10.27* 0.80 9.47*  GFRNONAA  --  6*  --  7*    LIVER FUNCTION TESTS: Recent Labs    03/20/22 1054 03/20/22 1718 03/21/22 0019  BILITOT 0.4 0.7  --   AST 14 16  --   ALT 12 14  --   ALKPHOS 95 81  --   PROT 8.2 7.5  --   ALBUMIN 4.5 3.9 3.4*    TUMOR MARKERS: No results for input(s): "AFPTM", "CEA", "CA199", "CHROMGRNA" in the last 8760 hours.  Assessment and Plan:  Scheduled for tunneled dialysis catheter placement in IR 10/18 Risks and benefits discussed with the patient including, but not limited to bleeding, infection, vascular injury, pneumothorax which may require chest tube placement, air embolism or even death  All of the patient's questions were answered, patient is agreeable to proceed. Consent signed and in chart.   Thank you for this interesting consult.  I greatly enjoyed meeting Tia E Haubner and look forward to participating in their care.  A copy of this report was sent to the requesting provider on this date.  Electronically Signed: Lavonia Drafts, PA-C 03/21/2022, 2:18 PM   I spent a total of 20 Minutes    in face to face in clinical consultation, greater than 50% of which was counseling/coordinating care for tunneled dialysis catheter placement

## 2022-03-21 NOTE — Progress Notes (Signed)
[facility] Progress Note    Assessment/ Plan:   AKI on CKDIV vs progression to ESRD - patient almost certainly has had progression with creatinine already in the 2.9-3.1 range in 2019 with no PMD or close f/u for the renal disease. Presumable the renal failure is from poorly controlled HTN + chronic daily NSAID use (he states he stopped a few mths ago) but mother was bedside confirming he had been using Goody powder for many years.  Urine does not appear to be active. -at this junction would hold off on biopsy as management will not likely change especially with the presence of small kidneys. -We discussed RRT/IHD and patient is receptive to this now and does agree to start. Will consult IR for [facility] placement, will advance conversations slowly about more permanent access (AVF/AVG). Will plan to start IHD#1 once catheter is in -CLIP for outpatient placement   HTN - restart home regimen and will titrate meds as needed;will UF as tolerated once he is more stable on HD Renal osteodystrophy - will check phos and PTH. Anemia - iron deplete. Will start Fe load with HD Tardive dyskinesia thought to be secondary to antipsychotics.  Subjective:   Agitated upon my arrival. Yelling to go home. Had a lengthy discussion about the severity of his kidney disease. No complaints More receptive and calm after our discussion   Objective:   BP (!) 141/59   Pulse 83   Temp 98.4 F (36.9 C) (Oral)   Resp 17   SpO2 98%   Intake/Output Summary (Last 24 hours) at 03/21/2022 1103 Last data filed at 03/21/2022 0919 Gross per 24 hour  Intake 1562.5 ml  Output 1710 ml  Net -147.5 ml   Weight change:   Physical Exam: Gen:NAD CVS:S1S2, RRR Resp: CTA b/l EXB:MWUX, nt/nd Ext:no sig edema Neuro: awake, alert, +TD, +tremors  Imaging: US RENAL  Result Date: 03/20/2022 CLINICAL DATA:  Acute kidney injury EXAM: RENAL / URINARY TRACT ULTRASOUND COMPLETE COMPARISON:  CT 08/14/2015 FINDINGS:  Right Kidney: Renal measurements: 8.4 x 4.7 x 4.9 cm = volume: 102.2 mL. Echogenic cortex. No hydronephrosis. Fewer than 10 cysts in the right kidney. The largest is seen at the upper pole and measures 21 mm. No follow-up imaging is recommended. Left Kidney: Renal measurements: 7.4 x 3.9 x 5 cm = volume: 75.5 mL. Echogenic cortex. No hydronephrosis. No mass. Bladder: Slightly thick walled in appearance. Prevoid bladder volume 203.5 mL. Patient was unable to void. Other: None. IMPRESSION: 1. Echogenic kidneys bilaterally consistent with medical renal disease. No hydronephrosis. 2. Nonspecific thick-walled appearance of the bladder Electronically Signed   By: Donavan Foil M.D.   On: 03/20/2022 22:18   DG Chest 2 View  Result Date: 03/20/2022 CLINICAL DATA:  Shortness of breath EXAM: CHEST - 2 VIEW COMPARISON:  04/21/2018 FINDINGS: The heart size and mediastinal contours are within normal limits. Both lungs are clear. The visualized skeletal structures are unremarkable. IMPRESSION: No active cardiopulmonary disease. Electronically Signed   By: Van Clines M.D.   On: 03/20/2022 18:20    Labs: BMET Recent Labs  Lab 03/20/22 1054 03/20/22 1718 03/20/22 1741 03/21/22 0019  NA 140 138 136 139  K 4.0 3.7 4.1 3.7  CL 108 109 103 110  CO2 19 17*  --  18*  GLUCOSE 102* 109* 106* 92  BUN 46* 46* 14 46*  CREATININE 9.54* 10.27* 0.80 9.47*  CALCIUM 9.9 9.6  --  9.1  PHOS  --   --   --  6.5*  6.4*   CBC Recent Labs  Lab 03/20/22 1054 03/20/22 1718 03/20/22 1741 03/21/22 0019  WBC 7.9 8.9  --  9.7  NEUTROABS  --  6.0  --   --   HGB 11.2* 10.8* 13.3 9.6*  HCT 33.5* 32.5* 39.0 28.8*  MCV 89.1 91.8  --  89.4  PLT 299.0 286  --  258    Medications:     amLODipine  10 mg Oral Daily   heparin  5,000 Units Subcutaneous Q8H      Gean Quint, MD [facility] 03/21/2022, 11:03 AM

## 2022-03-21 NOTE — Discharge Summary (Signed)
Physician Discharge Summary  William Gregory BOF:[id number] DOB: 1985-11-27 DOA: 03/20/2022  PCP: Patient, No Pcp Per  Admit date: 03/20/2022 Discharge date: 03/21/2022  Time spent: 35 minutes  Recommendations for Outpatient Follow-up:  No recommendations made-patient left AGAINST MEDICAL ADVICE despite being counseled in the presence of his mother  Discharge Diagnoses:  MAIN problem for hospitalization   Impending uremia  Please see below for itemized issues addressed in [facility]- refer to other progress notes for clarity if needed  Discharge Condition: Guarded  Diet recommendation: None.  There were no vitals filed for this visit.  History of present illness:  36 year old black male known history of prior tobacco?  Asthma CAD Prior cocaine marijuana EtOH use Until 2 months prior taking Goody powders for daily morning headaches-not taking now Seen by neurologist Dr. Wells Guiles Tat 10/17 for?  Tardive dyskinesia-felt to be secondary to Lorayne Bender (gets this injectable)--was started on Ingrezza at the office routine office labs creatinine of 10 sent to emergency room [note that in 2019 creatinine was 2.9] Nephrology consulted recommended work-up with renal ultrasound possible kidney biopsy if negative Started on LR 125 cc/H  Patient was seen by nephrology it was thought that patient would comply with medical recommendations-patient initially declined using IV fluids-then patient was insistent on going off the floor to smoke-I spoke to him personally and he seems to have capacity to refuse all of these interventions-I mention to him that he needs stay in the hospital for planning for HD and would need 2 to 3 days to get the planning sorted out-at this the patient responded "I will go see my own doctor and figure this out" he elected at that point to leave the hospital-he was given [facility] paperwork and he understood this.  His mother was present at the bedside and did not seem to  perturbed by these conversations-she mentioned to him as I was leaving the room "you are a grown man you can make these decisions not getting it anyway"  Patient had capacity based on my earlier conversations with him and was discharged [place] and told to return if he wished further treatment or therapy--if this behavior becomes a pattern I am not sure he is an optimal dialysis patient given the implied stipulation of 3 times a week HD and discipline with type of schedule   Discharge Exam: Vitals:   03/21/22 0535 03/21/22 0850  BP: (!) 155/114 (!) 141/59  Pulse: 80 83  Resp: 18 17  Temp: 98 F (36.7 C) 98.4 F (36.9 C)  SpO2: 95% 98%    Subj on day of d/c   Patient seen at bedside seems stable passing urine  General Exam on discharge  Intermittent tremors and facial tics noted primarily involving the tongue Chest clear no rales rhonchi Abdomen soft no rebound no guarding No lower extremity edema Ambulatory  Discharge Instructions   non given  Allergies  Allergen Reactions   Aspirin Swelling   Ibuprofen Rash      The results of significant diagnostics from this hospitalization (including imaging, microbiology, ancillary and laboratory) are listed below for reference.    Significant Diagnostic Studies: US RENAL  Result Date: 03/20/2022 CLINICAL DATA:  Acute kidney injury EXAM: RENAL / URINARY TRACT ULTRASOUND COMPLETE COMPARISON:  CT 08/14/2015 FINDINGS: Right Kidney: Renal measurements: 8.4 x 4.7 x 4.9 cm = volume: 102.2 mL. Echogenic cortex. No hydronephrosis. Fewer than 10 cysts in the right kidney. The largest is seen at the upper pole and measures 21  mm. No follow-up imaging is recommended. Left Kidney: Renal measurements: 7.4 x 3.9 x 5 cm = volume: 75.5 mL. Echogenic cortex. No hydronephrosis. No mass. Bladder: Slightly thick walled in appearance. Prevoid bladder volume 203.5 mL. Patient was unable to void. Other: None. IMPRESSION: 1. Echogenic  kidneys bilaterally consistent with medical renal disease. No hydronephrosis. 2. Nonspecific thick-walled appearance of the bladder Electronically Signed   By: Donavan Foil M.D.   On: 03/20/2022 22:18   DG Chest 2 View  Result Date: 03/20/2022 CLINICAL DATA:  Shortness of breath EXAM: CHEST - 2 VIEW COMPARISON:  04/21/2018 FINDINGS: The heart size and mediastinal contours are within normal limits. Both lungs are clear. The visualized skeletal structures are unremarkable. IMPRESSION: No active cardiopulmonary disease. Electronically Signed   By: Van Clines M.D.   On: 03/20/2022 18:20    Microbiology: No results found for this or any previous visit (from the past 240 hour(s)).   Labs: Basic Metabolic Panel: Recent Labs  Lab 03/20/22 1054 03/20/22 1718 03/20/22 1741 03/21/22 0019  NA 140 138 136 139  K 4.0 3.7 4.1 3.7  CL 108 109 103 110  CO2 19 17*  --  18*  GLUCOSE 102* 109* 106* 92  BUN 46* 46* 14 46*  CREATININE 9.54* 10.27* 0.80 9.47*  CALCIUM 9.9 9.6  --  9.1  MG  --  2.1  --  2.1  PHOS  --   --   --  6.5*  6.4*   Liver Function Tests: Recent Labs  Lab 03/20/22 1054 03/20/22 1718 03/21/22 0019  AST 14 16  --   ALT 12 14  --   ALKPHOS 95 81  --   BILITOT 0.4 0.7  --   PROT 8.2 7.5  --   ALBUMIN 4.5 3.9 3.4*   No results for input(s): "LIPASE", "AMYLASE" in the last 168 hours. No results for input(s): "AMMONIA" in the last 168 hours. CBC: Recent Labs  Lab 03/20/22 1054 03/20/22 1718 03/20/22 1741 03/21/22 0019  WBC 7.9 8.9  --  9.7  NEUTROABS  --  6.0  --   --   HGB 11.2* 10.8* 13.3 9.6*  HCT 33.5* 32.5* 39.0 28.8*  MCV 89.1 91.8  --  89.4  PLT 299.0 286  --  258   Cardiac Enzymes: Recent Labs  Lab 03/21/22 0019  CKTOTAL 572*   BNP: BNP (last 3 results) No results for input(s): "BNP" in the last 8760 hours.  ProBNP (last 3 results) No results for input(s): "PROBNP" in the last 8760 hours.  CBG: No results for input(s): "GLUCAP" in the  last 168 hours.     Signed:  Nita Sells MD   [facility] 03/21/2022, 3:31 PM

## 2022-03-21 NOTE — Progress Notes (Signed)
Dr. Verlon Au came to speak with the pt he is deciding to leave AMA. Paper signed and in the pt chart.

## 2022-03-21 NOTE — Plan of Care (Signed)

## 2022-03-21 NOTE — Progress Notes (Signed)
New Dialysis Start   Patient identified as new dialysis start. Kidney Education packet assembled and given. Discussed the following items with patient:    Current medications and possible changes once started:  Discussed that patient's medications may change over time.  Ex; hypertension medications and diabetes medication.  Nephrologists will adjust as needed.  Fluid restrictions reviewed:  32 oz daily goal:  All liquids count; soups, ice, jello   Phosphorus and potassium: Handout given showing high potassium and phosphorus foods.  Alternative food and drink options given.  Family support:  mother present and participated  Outpatient Clinic Resources:  Discussed roles of Outpatient clinic  staff and advised to make a list of needs, if any, to talk with outpatient staff if needed  Care plan schedule: Informed patient and family member of Care Plans in outpatient setting and to participate in the care plan.  An invitation would be given from outpatient clinic.   Dialysis Access Options:  Reviewed access options with patients. Discussed in detail about care at home with new AVG & AVF. Reviewed checking bruit and thrill. If dialysis catheter present, educated that patient could not take showers.  Catheter dressing changes were to be done by outpatient clinic staff only  Home therapy options:  Educated patient about home therapy options:  PD vs home hemo.    Patient verbalized understanding. Will continue to round on patient during admission.    Lilia Argue, RN

## 2022-03-21 NOTE — Progress Notes (Signed)
Pt is being non compliant and verbally aggressive. He stating that his is going to leave the floor and go outside to smoke a cigarette. Pt educated that he cannot leave the floor that will be considered leaving against medical advice. Pt said he do not care and started cursing. Dr. Darlyn Chamber. Mother at bedside laying on couch.

## 2022-03-22 LAB — HEPATITIS B SURFACE ANTIBODY, QUANTITATIVE: Hep B S AB Quant (Post): <3.1 m[IU]/mL — ABNORMAL LOW (ref >9.9)

## 2022-03-22 LAB — PARATHYROID HORMONE, INTACT (NO CA): PTH: 477 pg/mL — ABNORMAL HIGH (ref 15–65)

## 2022-03-29 ENCOUNTER — Encounter: Payer: Self-pay | Admitting: Nurse Practitioner

## 2022-03-29 ENCOUNTER — Ambulatory Visit (INDEPENDENT_AMBULATORY_CARE_PROVIDER_SITE_OTHER): Payer: Medicaid Other | Admitting: Nurse Practitioner

## 2022-03-29 VITALS — BP 127/77 | HR 64 | Temp 98.3°F | Ht 65.0 in | Wt 247.6 lb

## 2022-03-29 DIAGNOSIS — G2401 Drug induced subacute dyskinesia: Secondary | ICD-10-CM | POA: Insufficient documentation

## 2022-03-29 DIAGNOSIS — D649 Anemia, unspecified: Secondary | ICD-10-CM | POA: Insufficient documentation

## 2022-03-29 DIAGNOSIS — Z0000 Encounter for general adult medical examination without abnormal findings: Secondary | ICD-10-CM

## 2022-03-29 DIAGNOSIS — Z131 Encounter for screening for diabetes mellitus: Secondary | ICD-10-CM | POA: Diagnosis not present

## 2022-03-29 DIAGNOSIS — Z23 Encounter for immunization: Secondary | ICD-10-CM

## 2022-03-29 DIAGNOSIS — N189 Chronic kidney disease, unspecified: Secondary | ICD-10-CM

## 2022-03-29 DIAGNOSIS — D638 Anemia in other chronic diseases classified elsewhere: Secondary | ICD-10-CM | POA: Insufficient documentation

## 2022-03-29 DIAGNOSIS — F1411 Cocaine abuse, in remission: Secondary | ICD-10-CM | POA: Insufficient documentation

## 2022-03-29 DIAGNOSIS — F1290 Cannabis use, unspecified, uncomplicated: Secondary | ICD-10-CM | POA: Insufficient documentation

## 2022-03-29 DIAGNOSIS — I10 Essential (primary) hypertension: Secondary | ICD-10-CM | POA: Insufficient documentation

## 2022-03-29 DIAGNOSIS — N179 Acute kidney failure, unspecified: Secondary | ICD-10-CM

## 2022-03-29 DIAGNOSIS — D631 Anemia in chronic kidney disease: Secondary | ICD-10-CM

## 2022-03-29 DIAGNOSIS — Z716 Tobacco abuse counseling: Secondary | ICD-10-CM | POA: Insufficient documentation

## 2022-03-29 DIAGNOSIS — N2581 Secondary hyperparathyroidism of renal origin: Secondary | ICD-10-CM | POA: Insufficient documentation

## 2022-03-29 LAB — POCT URINALYSIS DIP (PROADVANTAGE DEVICE)
Bilirubin, UA: NEGATIVE
Glucose, UA: NEGATIVE mg/dL
Ketones, POC UA: NEGATIVE mg/dL
Leukocytes, UA: NEGATIVE
Nitrite, UA: NEGATIVE
Protein Ur, POC: >=300 mg/dL — AB
Specific Gravity, Urine: 1.015
Urobilinogen, Ur: 0.2
pH, UA: 5.5 (ref 5.0–8.0)

## 2022-03-29 LAB — POCT GLYCOSYLATED HEMOGLOBIN (HGB A1C): Hemoglobin A1C: 5.6 % (ref 4.0–5.6)

## 2022-03-29 NOTE — Assessment & Plan Note (Signed)
Stated that his last cocaine use was about 2 to 3 years ago.  Patient encouraged to continue to abstain from cocaine use he verbalized understanding

## 2022-03-29 NOTE — Assessment & Plan Note (Signed)
BP Readings from Last 3 Encounters:  03/29/22 127/77  03/21/22 (!) 141/59  03/20/22 (!) 159/100  chronic condition well controlled on amlodipine 5mg  daily Continue current medication Dash diet advised, engage in regular moderate exercises at least 150 minutes weekly.

## 2022-03-29 NOTE — Assessment & Plan Note (Signed)
Patient educated on CDC recommendation for the influenza vaccine. Verbal consent was obtained from the patient, vaccine administered by nurse, no sign of adverse reactions noted at this time. Patient education on arm soreness and use of tylenol for this patient  was discussed. Patient educated on the signs and symptoms of adverse effect and advise to contact the office if they occur.  

## 2022-03-29 NOTE — Assessment & Plan Note (Addendum)
Urine shows moderate blood, protein greater than 361m  Cr in 2019 was 2.90, EGFR 31.  Lab Results  Component Value Date   NA 139 03/21/2022   K 3.7 03/21/2022   CO2 18 (L) 03/21/2022   GLUCOSE 92 03/21/2022   BUN 46 (H) 03/21/2022   CREATININE 9.47 (H) 03/21/2022   CALCIUM 9.1 03/21/2022   GFRNONAA 7 (L) 03/21/2022  I discussed the need for the patient to go back to the emergency room for treatment but patient declined.  He states that he would rather follow-up with nephrologist.  Referral to nephrology placed today. Patient encouraged to avoid Aleve, ibuprofen drink at least 64 ounces of water daily to stay hydrated. Check CMP, CK levels

## 2022-03-29 NOTE — Assessment & Plan Note (Signed)
A1C is 5.6.

## 2022-03-29 NOTE — Assessment & Plan Note (Signed)
Lab Results  Component Value Date   PTH 477 (H) 03/21/2022   CALCIUM 9.1 03/21/2022   CAION 1.14 (L) 03/20/2022   PHOS 6.4 (H) 03/21/2022   PHOS 6.5 (H) 03/21/2022  secondary to CKD, patient denies bone pain  Will defer management to nephrology. Urgent referral to nephrology placed today.

## 2022-03-29 NOTE — Assessment & Plan Note (Deleted)
Prediabetes A1C 5.6

## 2022-03-29 NOTE — Assessment & Plan Note (Signed)
Need to quit smoking marijuana discussed with patient.

## 2022-03-29 NOTE — Assessment & Plan Note (Signed)
Smokes about 0.5 pack/day  Asked about quitting: confirms that he/she currently smokes cigarettes Advise to quit smoking: Educated about QUITTING to reduce the risk of cancer, cardio and cerebrovascular disease. Assess willingness: Unwilling to quit at this time, but is working on cutting back. Assist with counseling and pharmacotherapy: Counseled for 5 minutes and literature provided. Arrange for follow up: follow up in 1-3 months and continue to offer help.

## 2022-03-29 NOTE — Assessment & Plan Note (Signed)
Chronic condition due to use of antipsychotic He is currently being followed by neurologist He was encouraged to continue Semmes as ordered and maintain close follow-up with neurology

## 2022-03-29 NOTE — Progress Notes (Addendum)
New Patient Office Visit  Subjective:  Patient ID: William Gregory, male    DOB: [birth year]  Age: 36 y.o. MRN: [id number]  CC:  Chief Complaint  Patient presents with   Establish Care    Fasting     HPI William Gregory is a 36 y.o. male with past medical history of  CAD, tobacco abuse,tardive dyskenisia,  cocaine abuse in remission, AKI who presents to establish care for his chronic medical conditions. He is accompanied today by his significant other. No previous PCP  Patient was seen in the ER ON 03/20/2022 due to AKI . He was sent to the ER  from his neurologist office due to worsening kidney function.  but he left AMA despite plans to have in on admission for HD. Patient stated that ''they said that my kidneys are bad, I eat real good foods''.  Patient states that he still makes urine, he denies shortness of breath, body aches, malaise, edema.  Patient is alert and oriented x4. No sign of distress noted.   He started smoking cigarettes at age 45, smokes half pack daily, smokes marijuana daily, last cocaine use about 2-3 years. He denies wheezing, SOB, cough.    Schizophrenia. Currently on Invega injection every 3 months for schizophrenia, followed by psychiatrist at [facility]  Tardive dyskinesia, takes [employer]  daily , he is followed by neurology. Tongue dyskinesa and tremors noted on examination today . Patient denies HA, CP, seizures, confusion.  Flu vaccine was given in the office today.  Past Medical History:  Diagnosis Date   Alcohol abuse    Asthma    Back pain    Depression    Knee joint pain, left    Neck pain    Psychiatric problem    unspecified by patient (receives "injections" bi-weekly)   Tobacco abuse     Past Surgical History:  Procedure Laterality Date   arm surgery      Family History  Problem Relation Age of Onset   Diabetes Mother    Diabetes Father    CVA Father    Dementia Maternal Grandmother    Kidney disease Maternal  Grandmother    Heart attack Neg Hx     Social History   Socioeconomic History   Marital status: Single    Spouse name: Not on file   Number of children: Not on file   Years of education: Not on file   Highest education level: Not on file  Occupational History   Not on file  Tobacco Use   Smoking status: Every Day    Packs/day: 1.00    Types: Cigarettes   Smokeless tobacco: Never  Vaping Use   Vaping Use: Former  Substance and Sexual Activity   Alcohol use: Not Currently    Comment: sober x "months"   Drug use: Yes    Types: Cocaine, Marijuana    Comment: Hasn't used cocaine in "months"   Sexual activity: Yes  Other Topics Concern   Not on file  Social History Narrative   Right handed   Unemployed/disability   Ninth grade level of education   Social Determinants of Health   Financial Resource Strain: Not on file  Food Insecurity: Food Insecurity Present (03/20/2022)   Hunger Vital Sign    Worried About Running Out of Food in the Last Year: Sometimes true    Ran Out of Food in the Last Year: Patient refused  Transportation Needs: Unknown (03/20/2022)   PRAPARE - Transportation  Lack of Transportation (Medical): Patient refused    Lack of Transportation (Non-Medical): Patient refused  Physical Activity: Not on file  Stress: Not on file  Social Connections: Not on file  Intimate Partner Violence: Unknown (03/20/2022)   Humiliation, Afraid, Rape, and Kick questionnaire    Fear of Current or Ex-Partner: Patient refused    Emotionally Abused: Not on file    Physically Abused: Not on file    Sexually Abused: Not on file    ROS Review of Systems  Constitutional: Negative.  Negative for activity change, appetite change, chills, diaphoresis, fatigue, fever and unexpected weight change.  Respiratory: Negative.  Negative for apnea, cough, choking, chest tightness, shortness of breath, wheezing and stridor.   Cardiovascular:  Negative for chest pain, palpitations and  leg swelling.  Gastrointestinal: Negative.  Negative for abdominal distention, abdominal pain and anal bleeding.  Endocrine: Negative.   Genitourinary: Negative.  Negative for difficulty urinating, dysuria, enuresis and hematuria.  Musculoskeletal: Negative.   Neurological:  Positive for tremors. Negative for dizziness, seizures, syncope, facial asymmetry, speech difficulty, weakness, light-headedness, numbness and headaches.  Psychiatric/Behavioral: Negative.  Negative for agitation, behavioral problems, confusion, decreased concentration and dysphoric mood.     Objective:   Today's Vitals: BP 127/77   Pulse 64   Temp 98.3 F (36.8 C)   Ht $R'5\' 5"'Ja$  (1.651 m)   Wt 247 lb 9.6 oz (112.3 kg)   SpO2 100%   BMI 41.20 kg/m   Physical Exam Constitutional:      General: He is not in acute distress.    Appearance: He is obese. He is not ill-appearing, toxic-appearing or diaphoretic.  HENT:     Nose: No congestion or rhinorrhea.     Mouth/Throat:     Mouth: Mucous membranes are moist.     Pharynx: No oropharyngeal exudate or posterior oropharyngeal erythema.  Eyes:     General: No scleral icterus.       Right eye: No discharge.        Left eye: No discharge.     Extraocular Movements: Extraocular movements intact.     Conjunctiva/sclera: Conjunctivae normal.     Pupils: Pupils are equal, round, and reactive to light.  Cardiovascular:     Rate and Rhythm: Normal rate and regular rhythm.     Pulses: Normal pulses.     Heart sounds: Normal heart sounds. No murmur heard.    No friction rub. No gallop.  Pulmonary:     Effort: Pulmonary effort is normal. No respiratory distress.     Breath sounds: Normal breath sounds. No stridor. No wheezing, rhonchi or rales.  Chest:     Chest wall: No tenderness.  Abdominal:     General: There is no distension.     Palpations: Abdomen is soft.     Tenderness: There is no abdominal tenderness.  Musculoskeletal:        General: No swelling,  tenderness, deformity or signs of injury.     Right lower leg: No edema.     Left lower leg: No edema.  Skin:    General: Skin is warm and dry.     Capillary Refill: Capillary refill takes less than 2 seconds.     Coloration: Skin is not jaundiced.     Findings: No bruising or lesion.  Neurological:     Mental Status: He is alert and oriented to person, place, and time.     Cranial Nerves: No cranial nerve deficit.     Sensory:  No sensory deficit.     Motor: No weakness.     Gait: Gait normal.     Comments: Tremors of right hand and tongue noted during today's examination  Psychiatric:        Mood and Affect: Mood normal.        Behavior: Behavior normal.        Thought Content: Thought content normal.        Judgment: Judgment normal.     Assessment & Plan:   Problem List Items Addressed This Visit       Cardiovascular and Mediastinum   High blood pressure    BP Readings from Last 3 Encounters:  03/29/22 127/77  03/21/22 (!) 141/59  03/20/22 (!) 159/100  chronic condition well controlled on amlodipine $RemoveBefor'5mg'FbcrFMKiAQyO$  daily Continue current medication Dash diet advised, engage in regular moderate exercises at least 150 minutes weekly.         Endocrine   Hyperparathyroidism, secondary renal [facility])    Lab Results  Component Value Date   PTH 477 (H) 03/21/2022   CALCIUM 9.1 03/21/2022   CAION 1.14 (L) 03/20/2022   PHOS 6.4 (H) 03/21/2022   PHOS 6.5 (H) 03/21/2022  secondary to CKD, patient denies bone pain  Will defer management to nephrology. Urgent referral to nephrology placed today.         Nervous and Auditory   Tardive dyskinesia    Chronic condition due to use of antipsychotic He is currently being followed by neurologist He was encouraged to continue [place] as ordered and maintain close follow-up with neurology        Genitourinary   AKI (acute kidney injury) ([place]) - Primary    Urine shows moderate blood, protein greater than $RemoveBefo'300mg'cnBOUPZWEMm$   Cr in 2019  was 2.90, EGFR 31.  Lab Results  Component Value Date   NA 139 03/21/2022   K 3.7 03/21/2022   CO2 18 (L) 03/21/2022   GLUCOSE 92 03/21/2022   BUN 46 (H) 03/21/2022   CREATININE 9.47 (H) 03/21/2022   CALCIUM 9.1 03/21/2022   GFRNONAA 7 (L) 03/21/2022  I discussed the need for the patient to go back to the emergency room for treatment but patient declined.  He states that he would rather follow-up with nephrologist.  Referral to nephrology placed today. Patient encouraged to avoid Aleve, ibuprofen drink at least 64 ounces of water daily to stay hydrated. Check CMP, CK levels      Relevant Orders   Basic Metabolic Panel   CBC   CK   Ambulatory referral to Nephrology   POCT Urinalysis DIP (Proadvantage Device) (Completed)     Other   Anemia    Lab Results  Component Value Date   WBC 9.7 03/21/2022   HGB 9.6 (L) 03/21/2022   HCT 28.8 (L) 03/21/2022   MCV 89.4 03/21/2022   PLT 258 03/21/2022  Patient denies bleeding, anemia probably due to his CKD Recheck Check CBC levels      Relevant Orders   CBC   Screening for diabetes mellitus    A1C is 5.6.       Relevant Orders   POCT glycosylated hemoglobin (Hb A1C) (Completed)   POCT Urinalysis DIP (Proadvantage Device) (Completed)   Marijuana use    Need to quit smoking marijuana discussed with patient.      Cocaine abuse in remission [facility])    Stated that his last cocaine use was about 2 to 3 years ago.  Patient encouraged to continue to  abstain from cocaine use he verbalized understanding      Tobacco abuse counseling    Smokes about 0.5 pack/day  Asked about quitting: confirms that he/she currently smokes cigarettes Advise to quit smoking: Educated about QUITTING to reduce the risk of cancer, cardio and cerebrovascular disease. Assess willingness: Unwilling to quit at this time, but is working on cutting back. Assist with counseling and pharmacotherapy: Counseled for 5 minutes and literature provided. Arrange for  follow up: follow up in 1-3 months and continue to offer help.      Health care maintenance   Relevant Orders   POCT glycosylated hemoglobin (Hb A1C) (Completed)   POCT Urinalysis DIP (Proadvantage Device) (Completed)   Need for influenza vaccination    Patient educated on CDC recommendation for the influenza vaccine. Verbal consent was obtained from the patient, vaccine administered by nurse, no sign of adverse reactions noted at this time. Patient education on arm soreness and use of tylenol  for this patient  was discussed. Patient educated on the signs and symptoms of adverse effect and advise to contact the office if they occur.        Outpatient Encounter Medications as of 03/29/2022  Medication Sig   acetaminophen (TYLENOL) 500 MG tablet Take 500 mg by mouth every 6 (six) hours as needed for moderate pain.   albuterol (VENTOLIN HFA) 108 (90 Base) MCG/ACT inhaler Inhale 2 puffs into the lungs every 4 (four) hours as needed for wheezing or shortness of breath.   amLODipine (NORVASC) 5 MG tablet Take 1 tablet (5 mg total) by mouth daily.   paliperidone Palmitate ER (INVEGA TRINZA) 819 MG/2.63ML injection Inject 819 mg into the muscle every 3 (three) months.   No facility-administered encounter medications on file as of 03/29/2022.    Follow-up: Return in about 4 weeks (around 04/26/2022) for AKI.   Renee Rival, FNP

## 2022-03-29 NOTE — Patient Instructions (Signed)
I strongly advise you to consider going to the Emergency room due to your kidney disease.   We will get back to you about your lab results  You have been referred to nephrology, please avoid ibuprofen , aleve, drink at least 64 ounces of water daily.    It is important that you exercise regularly at least 30 minutes 5 times a week as tolerated  Think about what you will eat, plan ahead. Choose " clean, green, fresh or frozen" over canned, processed or packaged foods which are more sugary, salty and fatty. 70 to 75% of food eaten should be vegetables and fruit. Three meals at set times with snacks allowed between meals, but they must be fruit or vegetables. Aim to eat over a 12 hour period , example 7 am to 7 pm, and STOP after  your last meal of the day. Drink water,generally about 64 ounces per day, no other drink is as healthy. Fruit juice is best enjoyed in a healthy way, by EATING the fruit.  Thanks for choosing [facility] we consider it a privelige to serve you.

## 2022-03-29 NOTE — Assessment & Plan Note (Signed)
Lab Results  Component Value Date   WBC 9.7 03/21/2022   HGB 9.6 (L) 03/21/2022   HCT 28.8 (L) 03/21/2022   MCV 89.4 03/21/2022   PLT 258 03/21/2022  Patient denies bleeding, anemia probably due to his CKD Recheck Check CBC levels

## 2022-03-30 ENCOUNTER — Inpatient Hospital Stay (HOSPITAL_COMMUNITY): Payer: Medicaid Other

## 2022-03-30 ENCOUNTER — Encounter (HOSPITAL_COMMUNITY): Payer: Self-pay | Admitting: Emergency Medicine

## 2022-03-30 ENCOUNTER — Other Ambulatory Visit: Payer: Self-pay

## 2022-03-30 ENCOUNTER — Inpatient Hospital Stay (HOSPITAL_COMMUNITY)
Admission: EM | Admit: 2022-03-30 | Discharge: 2022-04-04 | DRG: 673 | Disposition: A | Discharge status: Home / Self Care | Payer: Medicaid Other | Attending: Internal Medicine | Admitting: Internal Medicine

## 2022-03-30 DIAGNOSIS — F17210 Nicotine dependence, cigarettes, uncomplicated: Secondary | ICD-10-CM | POA: Diagnosis present

## 2022-03-30 DIAGNOSIS — J45909 Unspecified asthma, uncomplicated: Secondary | ICD-10-CM | POA: Diagnosis present

## 2022-03-30 DIAGNOSIS — N189 Chronic kidney disease, unspecified: Secondary | ICD-10-CM

## 2022-03-30 DIAGNOSIS — D638 Anemia in other chronic diseases classified elsewhere: Secondary | ICD-10-CM | POA: Diagnosis present

## 2022-03-30 DIAGNOSIS — Z823 Family history of stroke: Secondary | ICD-10-CM

## 2022-03-30 DIAGNOSIS — N179 Acute kidney failure, unspecified: Secondary | ICD-10-CM | POA: Diagnosis present

## 2022-03-30 DIAGNOSIS — G2401 Drug induced subacute dyskinesia: Secondary | ICD-10-CM | POA: Diagnosis present

## 2022-03-30 DIAGNOSIS — T43595A Adverse effect of other antipsychotics and neuroleptics, initial encounter: Secondary | ICD-10-CM | POA: Diagnosis present

## 2022-03-30 DIAGNOSIS — D649 Anemia, unspecified: Secondary | ICD-10-CM | POA: Diagnosis present

## 2022-03-30 DIAGNOSIS — N186 End stage renal disease: Secondary | ICD-10-CM | POA: Diagnosis present

## 2022-03-30 DIAGNOSIS — D631 Anemia in chronic kidney disease: Secondary | ICD-10-CM | POA: Diagnosis not present

## 2022-03-30 DIAGNOSIS — I120 Hypertensive chronic kidney disease with stage 5 chronic kidney disease or end stage renal disease: Principal | ICD-10-CM | POA: Diagnosis present

## 2022-03-30 DIAGNOSIS — Z79899 Other long term (current) drug therapy: Secondary | ICD-10-CM

## 2022-03-30 DIAGNOSIS — I10 Essential (primary) hypertension: Secondary | ICD-10-CM | POA: Diagnosis not present

## 2022-03-30 DIAGNOSIS — F209 Schizophrenia, unspecified: Secondary | ICD-10-CM | POA: Diagnosis present

## 2022-03-30 DIAGNOSIS — Z886 Allergy status to analgesic agent status: Secondary | ICD-10-CM

## 2022-03-30 DIAGNOSIS — E876 Hypokalemia: Secondary | ICD-10-CM | POA: Diagnosis present

## 2022-03-30 DIAGNOSIS — Z992 Dependence on renal dialysis: Secondary | ICD-10-CM

## 2022-03-30 DIAGNOSIS — E538 Deficiency of other specified B group vitamins: Secondary | ICD-10-CM | POA: Diagnosis present

## 2022-03-30 DIAGNOSIS — Z833 Family history of diabetes mellitus: Secondary | ICD-10-CM | POA: Diagnosis not present

## 2022-03-30 DIAGNOSIS — Z6841 Body Mass Index (BMI) 40.0 and over, adult: Secondary | ICD-10-CM | POA: Diagnosis not present

## 2022-03-30 DIAGNOSIS — Z720 Tobacco use: Secondary | ICD-10-CM | POA: Diagnosis present

## 2022-03-30 DIAGNOSIS — K219 Gastro-esophageal reflux disease without esophagitis: Secondary | ICD-10-CM | POA: Diagnosis present

## 2022-03-30 DIAGNOSIS — N185 Chronic kidney disease, stage 5: Secondary | ICD-10-CM

## 2022-03-30 DIAGNOSIS — N19 Unspecified kidney failure: Secondary | ICD-10-CM | POA: Diagnosis present

## 2022-03-30 DIAGNOSIS — I252 Old myocardial infarction: Secondary | ICD-10-CM | POA: Diagnosis not present

## 2022-03-30 DIAGNOSIS — N2581 Secondary hyperparathyroidism of renal origin: Secondary | ICD-10-CM | POA: Diagnosis present

## 2022-03-30 DIAGNOSIS — F1010 Alcohol abuse, uncomplicated: Secondary | ICD-10-CM | POA: Diagnosis present

## 2022-03-30 LAB — VITAMIN B12: Vitamin B-12: 548 pg/mL (ref 180–914)

## 2022-03-30 LAB — BASIC METABOLIC PANEL
BUN/Creatinine Ratio: 4 — ABNORMAL LOW (ref 9–20)
BUN: 45 mg/dL — ABNORMAL HIGH (ref 6–20)
CO2: 16 mmol/L — ABNORMAL LOW (ref 20–29)
Calcium: 9.9 mg/dL (ref 8.7–10.2)
Chloride: 109 mmol/L — ABNORMAL HIGH (ref 96–106)
Creatinine, Ser: 10.03 mg/dL — ABNORMAL HIGH (ref 0.76–1.27)
Glucose: 76 mg/dL (ref 70–99)
Potassium: 4 mmol/L (ref 3.5–5.2)
Sodium: 144 mmol/L (ref 134–144)
eGFR: 6 mL/min/{1.73_m2} — ABNORMAL LOW (ref >59)

## 2022-03-30 LAB — RETICULOCYTES
Immature Retic Fract: 9.5 % (ref 2.3–15.9)
RBC.: 3.41 MIL/uL — ABNORMAL LOW (ref 4.22–5.81)
Retic Count, Absolute: 64.1 10*3/uL (ref 19.0–186.0)
Retic Ct Pct: 1.9 % (ref 0.4–3.1)

## 2022-03-30 LAB — RAPID URINE DRUG SCREEN, HOSP PERFORMED
Amphetamines: NOT DETECTED
Barbiturates: NOT DETECTED
Benzodiazepines: NOT DETECTED
Cocaine: NOT DETECTED
Opiates: NOT DETECTED
Tetrahydrocannabinol: NOT DETECTED

## 2022-03-30 LAB — COMPREHENSIVE METABOLIC PANEL
ALT: 15 U/L (ref 0–44)
AST: 21 U/L (ref 15–41)
Albumin: 4.2 g/dL (ref 3.5–5.0)
Alkaline Phosphatase: 83 U/L (ref 38–126)
Anion gap: 11 (ref 5–15)
BUN: 45 mg/dL — ABNORMAL HIGH (ref 6–20)
CO2: 18 mmol/L — ABNORMAL LOW (ref 22–32)
Calcium: 9.7 mg/dL (ref 8.9–10.3)
Chloride: 112 mmol/L — ABNORMAL HIGH (ref 98–111)
Creatinine, Ser: 10.87 mg/dL — ABNORMAL HIGH (ref 0.61–1.24)
GFR, Estimated: 6 mL/min — ABNORMAL LOW (ref >60)
Glucose, Bld: 122 mg/dL — ABNORMAL HIGH (ref 70–99)
Potassium: 3.6 mmol/L (ref 3.5–5.1)
Sodium: 141 mmol/L (ref 135–145)
Total Bilirubin: 0.7 mg/dL (ref 0.3–1.2)
Total Protein: 7.9 g/dL (ref 6.5–8.1)

## 2022-03-30 LAB — URINALYSIS, ROUTINE W REFLEX MICROSCOPIC
Bacteria, UA: NONE SEEN
Bilirubin Urine: NEGATIVE
Glucose, UA: NEGATIVE mg/dL
Ketones, ur: NEGATIVE mg/dL
Leukocytes,Ua: NEGATIVE
Nitrite: NEGATIVE
Protein, ur: 100 mg/dL — AB
Specific Gravity, Urine: 1.003 — ABNORMAL LOW (ref 1.005–1.030)
pH: 6 (ref 5.0–8.0)

## 2022-03-30 LAB — CBC
HCT: 31.1 % — ABNORMAL LOW (ref 39.0–52.0)
Hematocrit: 31.1 % — ABNORMAL LOW (ref 37.5–51.0)
Hemoglobin: 10.4 g/dL — ABNORMAL LOW (ref 13.0–17.7)
Hemoglobin: 10.3 g/dL — ABNORMAL LOW (ref 13.0–17.0)
MCH: 29.5 pg (ref 26.6–33.0)
MCH: 29.9 pg (ref 26.0–34.0)
MCHC: 33.4 g/dL (ref 31.5–35.7)
MCHC: 33.1 g/dL (ref 30.0–36.0)
MCV: 88 fL (ref 79–97)
MCV: 90.4 fL (ref 80.0–100.0)
Platelets: 273 10*3/uL (ref 150–450)
Platelets: 268 10*3/uL (ref 150–400)
RBC: 3.53 x10E6/uL — ABNORMAL LOW (ref 4.14–5.80)
RBC: 3.44 MIL/uL — ABNORMAL LOW (ref 4.22–5.81)
RDW: 13.8 % (ref 11.6–15.4)
RDW: 13.7 % (ref 11.5–15.5)
WBC: 7.2 10*3/uL (ref 3.4–10.8)
WBC: 8.9 10*3/uL (ref 4.0–10.5)
nRBC: 0 % (ref 0.0–0.2)

## 2022-03-30 LAB — CK: Total CK: 1207 U/L (ref 49–439)

## 2022-03-30 LAB — IRON AND TIBC
Iron: 49 ug/dL (ref 45–182)
Saturation Ratios: 19 % (ref 17.9–39.5)
TIBC: 265 ug/dL (ref 250–450)
UIBC: 216 ug/dL

## 2022-03-30 LAB — MAGNESIUM: Magnesium: 2.2 mg/dL (ref 1.7–2.4)

## 2022-03-30 LAB — FOLATE: Folate: 5.6 ng/mL — ABNORMAL LOW (ref >5.9)

## 2022-03-30 LAB — FERRITIN: Ferritin: 134 ng/mL (ref 24–336)

## 2022-03-30 MED ORDER — ACETAMINOPHEN 500 MG PO TABS
ORAL_TABLET | ORAL | Status: AC
  Administered 2022-03-30: 1000 mg
  Filled 2022-03-30: qty 2

## 2022-03-30 MED ORDER — NICOTINE 14 MG/24HR TD PT24
14.0000 mg | MEDICATED_PATCH | Freq: Every day | TRANSDERMAL | Status: DC
  Administered 2022-04-02: 14 mg via TRANSDERMAL
  Filled 2022-03-30 (×5): qty 1

## 2022-03-30 NOTE — Assessment & Plan Note (Signed)
In remission.

## 2022-03-30 NOTE — Assessment & Plan Note (Addendum)
Nephrology aware, transfer to [facility] For possible HD in AM will need access  no indication for emergent HD

## 2022-03-30 NOTE — Progress Notes (Signed)
I called the patient twice was unable to reach him on the phone I then called his mother.  I discussed with her that it is very important that the patient go back to the emergency room for treatment right away to avoid , heart attack, stroke, sudden death and other complication   I encouraged his mother to persuade the patient to go to the emergency room right away for treatment she verbalized understanding. I spoke with TJ the charge RN at [facility] and informed her that the patient will be coming to the hospital for treatment.

## 2022-03-30 NOTE — H&P (Signed)
MALE MINISH DVV:[id number] DOB: [birth year] DOA: 03/30/2022     PCP: Patient, No Pcp Per   Outpatient Specialists:      Patient arrived to ER on 03/30/22 at 1522 Referred by Attending Toy Baker, MD   Patient coming from:    home Lives  With family    Chief Complaint:   Chief Complaint  Patient presents with   abnormal labs    HPI: William Gregory is a 36 y.o. male with medical history significant of HTN, ESRD history of tobacco and alcohol abuse, tardive dyskinesia history of polysubstance abuse anemia of chronic disease morbid obesity    Presented with   abnormal labs Patient recently was diagnosed with end-stage renal disease started on hemodialysis but left AMA his baseline creatinine is at 10 he went to his primary care provider yesterday had repeated labs done which still showed a creatinine of 10.  He was told to go back to emergency department she did at this time is agreeable to stay. He is still making urine.  No confusion no nausea no vomiting no diarrhea   Patient still smokes half a pack a day and also smokes marijuana daily Last cocaine was 2 to 3 years ago. No Etoh   Regarding pertinent Chronic problems:       HTN on Norvasc Schizophrenia on Invega injection every 3 months Tardive dyskinesia followed by neurology     Morbid obesity-   BMI Readings from Last 1 Encounters:  03/29/22 41.20 kg/m       CKD stage V- baseline Cr 10 Estimated Creatinine Clearance: 10.7 mL/min (A) (by C-G formula based on SCr of 10.87 mg/dL (H)).  Lab Results  Component Value Date   CREATININE 10.87 (H) 03/30/2022   CREATININE 10.03 (H) 03/29/2022   CREATININE 9.47 (H) 03/21/2022    Chronic anemia - baseline hg Hemoglobin & Hematocrit  Recent Labs    03/21/22 0019 03/29/22 1051 03/30/22 1548  HGB 9.6* 10.4* 10.3*     While in ER:   No evidence of hyperkalemia fluid overload no need for emergent dialysis nephrology was consulted will see  in consult tomorrow request admission to [facility]     CXR -  NON acute    Following Medications were ordered in ER: Medications - No data to display  _______________________________________________________ ER Provider Called:   Nephrology Dr. Joelyn Oms They Recommend admit to medicine   Will see in AM     ED Triage Vitals  Enc Vitals Group     BP 03/30/22 1530 (!) 160/130     Pulse Rate 03/30/22 1530 91     Resp 03/30/22 1532 16     Temp 03/30/22 1532 97.8 F (36.6 C)     Temp Source 03/30/22 1532 Oral     SpO2 03/30/22 1530 99 %     Weight --      Height --      Head Circumference --      Peak Flow --      Pain Score 03/30/22 1532 0     Pain Loc --      Pain Edu? --      Excl. in [place]? --   TMAX(24)@     _________________________________________ Significant initial  Findings: Abnormal Labs Reviewed  CBC - Abnormal; Notable for the following components:      Result Value   RBC 3.44 (*)    Hemoglobin 10.3 (*)    HCT 31.1 (*)  All other components within normal limits  COMPREHENSIVE METABOLIC PANEL - Abnormal; Notable for the following components:   Chloride 112 (*)    CO2 18 (*)    Glucose, Bld 122 (*)    BUN 45 (*)    Creatinine, Ser 10.87 (*)    GFR, Estimated 6 (*)    All other components within normal limits   CK 1200 _________________________ Troponin   ECG: Ordered Personally reviewed and interpreted by me showing: HR : 66 Rhythm:Sinus rhythm Borderline repolarization abnormality artifact resolved. NSR QTC 436    The recent clinical data is shown below. Vitals:   03/30/22 1530 03/30/22 1532 03/30/22 1848  BP: (!) 160/130  (!) 152/102  Pulse: 91  79  Resp:  16 15  Temp:  97.8 F (36.6 C) 98 F (36.7 C)  TempSrc:  Oral Oral  SpO2: 99%  98%       WBC     Component Value Date/Time   WBC 8.9 03/30/2022 1548   LYMPHSABS 2.1 03/20/2022 1718   MONOABS 0.6 03/20/2022 1718   EOSABS 0.1 03/20/2022 1718   BASOSABS 0.1 03/20/2022 1718        UA   no evidence of UTI     Urine analysis:    Component Value Date/Time   COLORURINE COLORLESS (A) 03/30/2022 1540   APPEARANCEUR CLEAR 03/30/2022 1540   LABSPEC 1.003 (L) 03/30/2022 1540   LABSPEC 1.015 03/29/2022 1118   PHURINE 6.0 03/30/2022 1540   GLUCOSEU NEGATIVE 03/30/2022 1540   HGBUR SMALL (A) 03/30/2022 1540   HGBUR negative 12/15/2008 1313   BILIRUBINUR NEGATIVE 03/30/2022 1540   BILIRUBINUR negative 03/29/2022 1118   KETONESUR NEGATIVE 03/30/2022 1540   PROTEINUR 100 (A) 03/30/2022 1540   UROBILINOGEN 1.0 04/21/2009 0315   NITRITE NEGATIVE 03/30/2022 1540   LEUKOCYTESUR NEGATIVE 03/30/2022 1540    _______________________________________________ Hospitalist was called for admission for   AKI (acute kidney injury)     The following Work up has been ordered so far:  Orders Placed This Encounter  Procedures   CBC   Comprehensive metabolic panel   Urinalysis, Routine w reflex microscopic Urine, Clean Catch   Magnesium   Cardiac monitoring   Consult to nephrology   Consult to hospitalist   ED EKG   ED EKG   ED EKG   Admit to Inpatient (patient's expected length of stay will be greater than 2 midnights or inpatient only procedure)     OTHER Significant initial  Findings:  labs showing:    Recent Labs  Lab 03/29/22 1051 03/30/22 1548  NA 144 141  K 4.0 3.6  CO2 16* 18*  GLUCOSE 76 122*  BUN 45* 45*  CREATININE 10.03* 10.87*  CALCIUM 9.9 9.7  MG  --  2.2    Cr  stable,   Lab Results  Component Value Date   CREATININE 10.87 (H) 03/30/2022   CREATININE 10.03 (H) 03/29/2022   CREATININE 9.47 (H) 03/21/2022    Recent Labs  Lab 03/30/22 1548  AST 21  ALT 15  ALKPHOS 83  BILITOT 0.7  PROT 7.9  ALBUMIN 4.2   Lab Results  Component Value Date   CALCIUM 9.7 03/30/2022   PHOS 6.4 (H) 03/21/2022   PHOS 6.5 (H) 03/21/2022          Plt: Lab Results  Component Value Date   PLT 268 03/30/2022       COVID-19 Labs  No results for  input(s): "DDIMER", "FERRITIN", "LDH", "CRP" in the  last 72 hours.  Lab Results  Component Value Date   SARSCOV2NAA NEGATIVE 02/12/2022   SARSCOV2NAA NEGATIVE 05/08/2020   [place] NEGATIVE 12/15/2019        Recent Labs  Lab 03/29/22 1051 03/30/22 1548  WBC 7.2 8.9  HGB 10.4* 10.3*  HCT 31.1* 31.1*  MCV 88 90.4  PLT 273 268    HG/HCT  stable,       Component Value Date/Time   HGB 10.3 (L) 03/30/2022 1548   HGB 10.4 (L) 03/29/2022 1051   HCT 31.1 (L) 03/30/2022 1548   HCT 31.1 (L) 03/29/2022 1051   MCV 90.4 03/30/2022 1548   MCV 88 03/29/2022 1051      Cardiac Panel (last 3 results) Recent Labs    03/29/22 1051  CKTOTAL 1,207*    .car BNP (last 3 results) No results for input(s): "BNP" in the last 8760 hours.    DM  labs:  HbA1C: Recent Labs    03/29/22 1118  HGBA1C 5.6       CBG (last 3)  No results for input(s): "GLUCAP" in the last 72 hours.        Cultures:    Component Value Date/Time   SDES THROAT 12/15/2019 1121   SPECREQUEST NONE 12/15/2019 1121   CULT  12/15/2019 1121    NO GROUP A STREP (S.PYOGENES) ISOLATED Performed at [facility], [place] [address] [place], [place] [address]    REPTSTATUS 12/17/2019 FINAL 12/15/2019 1121     Radiological Exams on Admission: No results found. _______________________________________________________________________________________________________ Latest  Blood pressure (!) 152/102, pulse 79, temperature 98 F (36.7 C), temperature source Oral, resp. rate 15, SpO2 98 %.   Vitals  labs and radiology finding personally reviewed  Review of Systems:    Pertinent positives include:    fatigue, headaches Constitutional:  No weight loss, night sweats, Fevers, chills,weight loss  HEENT:  No , Difficulty swallowing,Tooth/dental problems,Sore throat,  No sneezing, itching, ear ache, nasal congestion, post nasal drip,  Cardio-vascular:  No chest pain, Orthopnea, PND, anasarca, dizziness,  palpitations.no Bilateral lower extremity swelling  GI:  No heartburn, indigestion, abdominal pain, nausea, vomiting, diarrhea, change in bowel habits, loss of appetite, melena, blood in stool, hematemesis Resp:  no shortness of breath at rest. No dyspnea on exertion, No excess mucus, no productive cough, No non-productive cough, No coughing up of blood.No change in color of mucus.No wheezing. Skin:  no rash or lesions. No jaundice GU:  no dysuria, change in color of urine, no urgency or frequency. No straining to urinate.  No flank pain.  Musculoskeletal:  No joint pain or no joint swelling. No decreased range of motion. No back pain.  Psych:  No change in mood or affect. No depression or anxiety. No memory loss.  Neuro: no localizing neurological complaints, no tingling, no weakness, no double vision, no gait abnormality, no slurred speech, no confusion  All systems reviewed and apart from [place] all are negative _______________________________________________________________________________________________ Past Medical History:   Past Medical History:  Diagnosis Date   Alcohol abuse    Asthma    Back pain    Depression    Knee joint pain, left    Neck pain    Psychiatric problem    unspecified by patient (receives "injections" bi-weekly)   Tobacco abuse       Past Surgical History:  Procedure Laterality Date   arm surgery      Social History:  Ambulatory   independently  reports that he has been smoking cigarettes. He has been smoking an average of 1 pack per day. He has never used smokeless tobacco. He reports that he does not currently use alcohol. He reports current drug use. Drugs: Cocaine and Marijuana.     Family History:   Family History  Problem Relation Age of Onset   Diabetes Mother    Diabetes Father    CVA Father    Dementia Maternal Grandmother    Kidney disease Maternal Grandmother    Heart attack Neg Hx     ______________________________________________________________________________________________ Allergies: Allergies  Allergen Reactions   Aspirin Swelling   Ibuprofen Rash     Prior to Admission medications   Medication Sig Start Date End Date Taking? Authorizing Provider  acetaminophen (TYLENOL) 500 MG tablet Take 500 mg by mouth every 6 (six) hours as needed for moderate pain.   Yes [provider]  albuterol (VENTOLIN HFA) 108 (90 Base) MCG/ACT inhaler Inhale 2 puffs into the lungs every 4 (four) hours as needed for wheezing or shortness of breath. 02/12/22  Yes Barrett Henle, MD  amLODipine (NORVASC) 5 MG tablet Take 1 tablet (5 mg total) by mouth daily. 02/12/22  Yes Barrett Henle, MD  paliperidone Palmitate ER (INVEGA TRINZA) 819 MG/2.63ML injection Inject 819 mg into the muscle every 3 (three) months.   Yes [provider]    ___________________________________________________________________________________________________ Physical Exam:    03/30/2022    6:48 PM 03/30/2022    3:30 PM 03/29/2022   10:01 AM  Vitals with BMI  Height   5\' 5"   Weight   247 lbs 10 oz  BMI   67.3  Systolic 419 379 024  Diastolic 097 353 77  Pulse 79 91 64     1. General:  in No  Acute distress    Chronically ill   -appearing 2. Psychological: Alert and   Oriented 3. Head/ENT: Dry Mucous Membranes                          Head Non traumatic, neck supple                           Poor Dentition 4. SKIN:  decreased Skin turgor,  Skin clean Dry and intact no rash 5. Heart: Regular rate and rhythm no  Murmur, no Rub or gallop 6. Lungs:  no wheezes or crackles   7. Abdomen: Soft,  non-tender, Non distended   obese  bowel sounds present 8. Lower extremities: no clubbing, cyanosis, no  edema 9. Neurologically Grossly intact, moving all 4 extremities equally   10. MSK: Normal range of motion    Chart has been  reviewed  ______________________________________________________________________________________________  Assessment/Plan 36 y.o. male with medical history significant of HTN, ESRD history of tobacco and alcohol abuse, tardive dyskinesia history of polysubstance abuse anemia of chronic disease morbid obesity  Admitted for   AKI (acute kidney injury)     Present on Admission:  Renal failure  Alcohol abuse  Tobacco abuse  Anemia  Tardive dyskinesia  Essential hypertension   Renal failure Nephrology aware, transfer to [facility] For [facility] HD in AM will need access  no indication for emergent HD  Alcohol abuse In remission  Tobacco abuse  - Spoke about importance of quitting spent 5 minutes discussing options for treatment, prior attempts at quitting, and dangers of smoking  -At this point patient is  interested in quitting  - order nicotine patch   - nursing tobacco cessation protocol   Anemia Obtain anemia panel in AM  Tardive dyskinesia Restart home meds when able  Essential hypertension contiue norvasc 68m gpo q day    Other plan as per orders.  DVT prophylaxis:  SCD      Code Status:    Code Status: Prior FULL CODE as per patient   I had personally discussed CODE STATUS with patient and family    Family Communication:   Family   at  Bedside  plan of care was discussed   with   mother  Disposition Plan:     To home once workup is complete and patient is stable   Following barriers for discharge:                            Electrolytes corrected                                                       Will need consultants to evaluate patient prior to discharge    Consults called: Nephrology is aware  Admission status:  ED Disposition     ED Disposition  [place]: [place] [[id number]]  Level of Care: Telemetry Medical [[id number]]  May admit patient to [facility] if equivalent level of care is  available:: No  Covid Evaluation: Asymptomatic - no recent exposure (last 10 days) testing not required  Diagnosis: Renal failure [284132]  Admitting Physician: Pierron, Walker  Attending Physician: Toy Baker [[id number]]  Certification:: I certify this patient will need inpatient services for at least 2 midnights  Estimated Length of Stay: 2          inpatient     I Expect 2 midnight stay secondary to severity of patient's current illness need for inpatient interventions justified by the following:    Severe lab/radiological/exam abnormalities including:    AKI and extensive comorbidities including:  substance abuse  Morbid Obesity   CKD   That are currently affecting medical management.   I expect  patient to be hospitalized for 2 midnights requiring inpatient medical care.  Patient is at high risk for adverse outcome (such as loss of life or disability) if not treated.  Indication for inpatient stay as follows:    Need for operative/procedural  intervention     Need for hemodialysis  Level of care     tele  For 12H     Zorah Backes 03/30/2022, 8:50 PM    [facility]     after 2 AM please page floor coverage PA If 7AM-7PM, please contact the day team taking care of the patient using [email or web address]   Patient was evaluated in the context of the global COVID-19 pandemic, which necessitated consideration that the patient might be at risk for infection with the SARS-CoV-2 virus that causes COVID-19. Institutional protocols and algorithms that pertain to the evaluation of patients at risk for COVID-19 are in a state of rapid change based on information released by regulatory bodies including the CDC and federal and state organizations. These policies and algorithms were followed during the patient's care.

## 2022-03-30 NOTE — Progress Notes (Signed)
Brief Nephrology Note  Pt seen 03/21/22 by Dr. Candiss Norse for severe renal failure, likely ESRD and plan for initiation of HD.  Patient left AMA prior to this and returned to [facility] ED today, now willing to start HD and stay for CLIP per ED MD.  Labs reflect severe renal failure but K and HCO3 are ok.    Plan to admit to [facility].  NPO p MN for IR [facility] tomorrow.  VVS will need to be consulted and CLIP started in AM.  HD after [facility]

## 2022-03-30 NOTE — ED Triage Notes (Signed)
Pt reports being sent to ED for kidney failure per his PCP. Denies any complaints at this time. Was seen at [facility] from same 10/16.

## 2022-03-30 NOTE — ED Provider Notes (Signed)
[facility] DEPT Provider Note   CSN: [id number] Arrival date & time: 03/30/22  1522     History  Chief Complaint  Patient presents with   abnormal labs    William Gregory is a 36 y.o. male.  36 year old male with past medical history significant for hypertension on amlodipine presents today for evaluation of elevated creatinine.  He was recently evaluated and admitted for AKI with creatinine around 10.  Discussions regarding dialysis were had and he was initially agreeable however ultimately he decided to leave AMA.  Patient followed up with his primary care doctor yesterday and had repeat labs done.  He states he got a call today requesting that he come to the emergency room for further evaluation.  He denies any peripheral edema, shortness of breath, chest pain, abdominal distention, or other complaints.  He does state that he is agreeable to stay for full inpatient work-up at this time.  Mom is at bedside.  Patient reports she is still able to make urine and is not having any difficulty.  The history is provided by the patient. No language interpreter was used.       Home Medications Prior to Admission medications   Medication Sig Start Date End Date Taking? Authorizing Provider  acetaminophen (TYLENOL) 500 MG tablet Take 500 mg by mouth every 6 (six) hours as needed for moderate pain.    [provider]  albuterol (VENTOLIN HFA) 108 (90 Base) MCG/ACT inhaler Inhale 2 puffs into the lungs every 4 (four) hours as needed for wheezing or shortness of breath. 02/12/22   Barrett Henle, MD  amLODipine (NORVASC) 5 MG tablet Take 1 tablet (5 mg total) by mouth daily. 02/12/22   Barrett Henle, MD  paliperidone Palmitate ER (INVEGA TRINZA) 819 MG/2.63ML injection Inject 819 mg into the muscle every 3 (three) months.    [provider]      Allergies    Aspirin and Ibuprofen    Review of Systems   Review of Systems   Constitutional:  Negative for fever.  Respiratory:  Negative for shortness of breath.   Cardiovascular:  Negative for chest pain and leg swelling.  Gastrointestinal:  Negative for abdominal distention and abdominal pain.  Genitourinary:  Negative for difficulty urinating.  All other systems reviewed and are negative.   Physical Exam Updated Vital Signs BP (!) 160/130   Pulse 91   Temp 97.8 F (36.6 C) (Oral)   Resp 16   SpO2 99%  Physical Exam Vitals and nursing note reviewed.  Constitutional:      General: He is not in acute distress.    Appearance: Normal appearance. He is not ill-appearing.  HENT:     Head: Normocephalic and atraumatic.     Nose: Nose normal.  Eyes:     General: No scleral icterus.    Extraocular Movements: Extraocular movements intact.     Conjunctiva/sclera: Conjunctivae normal.  Cardiovascular:     Rate and Rhythm: Normal rate and regular rhythm.     Pulses: Normal pulses.  Pulmonary:     Effort: Pulmonary effort is normal. No respiratory distress.     Breath sounds: Normal breath sounds. No wheezing or rales.  Abdominal:     General: There is no distension.     Tenderness: There is no abdominal tenderness. There is no guarding.  Musculoskeletal:        General: Normal range of motion.     Cervical back: Normal range of  motion.     Right lower leg: No edema.     Left lower leg: No edema.  Skin:    General: Skin is warm and dry.  Neurological:     General: No focal deficit present.     Mental Status: He is alert. Mental status is at baseline.     ED Results / Procedures / Treatments   Labs (all labs ordered are listed, but only abnormal results are displayed) Labs Reviewed  CBC - Abnormal; Notable for the following components:      Result Value   RBC 3.44 (*)    Hemoglobin 10.3 (*)    HCT 31.1 (*)    All other components within normal limits  COMPREHENSIVE METABOLIC PANEL - Abnormal; Notable for the following components:   Chloride  112 (*)    CO2 18 (*)    Glucose, Bld 122 (*)    BUN 45 (*)    Creatinine, Ser 10.87 (*)    GFR, Estimated 6 (*)    All other components within normal limits  MAGNESIUM  URINALYSIS, ROUTINE W REFLEX MICROSCOPIC    EKG None  Radiology No results found.  Procedures Procedures    Medications Ordered in ED Medications - No data to display  ED Course/ Medical Decision Making/ A&P                           Medical Decision Making  Medical Decision Making / ED Course   This patient presents to the ED for concern of AKI, this involves an extensive number of treatment options, and is a complaint that carries with it a high risk of complications and morbidity.  The differential diagnosis includes ESRD, AKI, dehydration, outlet obstruction  MDM: 36 year old male with past medical history significant for hypertension on amlodipine 2 which she reports compliance presents for evaluation of elevated creatinine.  Longstanding history of NSAID use, currently denies.  He was recently admitted on 10/16 but left AMA on 10/17 prior to getting dialysis set up.  Followed up with his PCP yesterday and was instructed to come back in today for persistent elevated creatinine.  He is without shortness of breath, chest pain, without exam.  Volume overload.  He is agreeable to stay and complete his admission to get dialysis set up and other management he needs.  Electrolyte within normal limits.  No EKG changes that would warrant urgent or emergent dialysis.  Discussed with Dr. Joelyn Oms of nephrology who recommends admission to medicine but would like him at  [facility].  Discussed with evaluate patient for admission.   Additional history obtained: -Additional history obtained from recent admission, mom at bedside -External records from outside source obtained and reviewed including: Chart review including previous notes, labs, imaging, consultation notes   Lab Tests: -I ordered, reviewed, and  interpreted labs.   The pertinent results include:   Labs Reviewed  CBC - Abnormal; Notable for the following components:      Result Value   RBC 3.44 (*)    Hemoglobin 10.3 (*)    HCT 31.1 (*)    All other components within normal limits  COMPREHENSIVE METABOLIC PANEL - Abnormal; Notable for the following components:   Chloride 112 (*)    CO2 18 (*)    Glucose, Bld 122 (*)    BUN 45 (*)    Creatinine, Ser 10.87 (*)    GFR, Estimated 6 (*)    All other components  within normal limits  MAGNESIUM  URINALYSIS, ROUTINE W REFLEX MICROSCOPIC      EKG  EKG Interpretation  Date/Time:  Thursday March 30 2022 15:45:16 EDT Ventricular Rate:  89 PR Interval:    QRS Duration: 92 QT Interval:  349 QTC Calculation: 425 R Axis:   68 Text Interpretation: Atrial fibrillation Nonspecific repol abnormality, diffuse leads need repeat. suspect artifact Confirmed by Charlesetta Shanks [phone]) on 03/30/2022 6:42:12 PM         Medicines ordered and prescription drug management: No orders of the defined types were placed in this encounter.   -I have reviewed the patients home medicines and have made adjustments as needed  Reevaluation: After the interventions noted above, I reevaluated the patient and found that they have :stayed the same  Co morbidities that complicate the patient evaluation  Past Medical History:  Diagnosis Date   Alcohol abuse    Asthma    Back pain    Depression    Knee joint pain, left    Neck pain    Psychiatric problem    unspecified by patient (receives "injections" bi-weekly)   Tobacco abuse       Dispostion: Discussed with hospitalist and patient will be admitted for further evaluation.   Final Clinical Impression(s) / ED Diagnoses Final diagnoses:  AKI (acute kidney injury) [facility])    Rx / [place] Orders ED Discharge Orders     None         Evlyn Courier, PA-C 03/30/22 Einar Crow    Charlesetta Shanks, MD 04/05/22 1512

## 2022-03-30 NOTE — Assessment & Plan Note (Signed)
-   Spoke about importance of quitting spent 5 minutes discussing options for treatment, prior attempts at quitting, and dangers of smoking ? -At this point patient is    interested in quitting ? - order nicotine patch  ? - nursing tobacco cessation protocol ? ?

## 2022-03-30 NOTE — Subjective & Objective (Signed)
Patient recently was diagnosed with end-stage renal disease started on hemodialysis but left AMA his baseline creatinine is at 10 he went to his primary care provider yesterday had repeated labs done which still showed a creatinine of 10.  He was told to go back to emergency department she did at this time is agreeable to stay. He is still making urine.  No confusion no nausea no vomiting no diarrhea

## 2022-03-30 NOTE — Assessment & Plan Note (Signed)
Restart home meds when able

## 2022-03-30 NOTE — Assessment & Plan Note (Signed)
contiue norvasc 5 mg po q day 

## 2022-03-30 NOTE — ED Provider Triage Note (Signed)
Emergency Medicine Provider Triage Evaluation Note  William Gregory , a 36 y.o. male  was evaluated in triage.  Pt presents today for acute renal failure. Was just admitted for same with plan for hemodialysis, however patient left AMA instead. States that he went to follow-up with his PCP and was sent here for admission. Patient states that he is amenable to staying this time, states he left because he is asymtpomatic. Denies shortness of breath, chest pain, leg swelling, nausea, or vomiting. No known hx of hypertension or diabetes  Review of Systems  Positive:  Negative:   Physical Exam  BP (!) 160/130   Pulse 91   Temp 97.8 F (36.6 C) (Oral)   Resp 16   SpO2 99%  Gen:   Awake, no distress   Resp:  Normal effort  MSK:   Moves extremities without difficulty  Other:    Medical Decision Making  Medically screening exam initiated at 3:40 PM.  Appropriate orders placed.  [name] was informed that the remainder of the evaluation will be completed by another provider, this initial triage assessment does not replace that evaluation, and the importance of remaining in the ED until their evaluation is complete.     Bud Face, PA-C 03/30/22 1542

## 2022-03-30 NOTE — Assessment & Plan Note (Signed)
Obtain anemia panel in AM 

## 2022-03-31 ENCOUNTER — Inpatient Hospital Stay (HOSPITAL_COMMUNITY): Payer: Medicaid Other

## 2022-03-31 DIAGNOSIS — I10 Essential (primary) hypertension: Secondary | ICD-10-CM | POA: Diagnosis not present

## 2022-03-31 DIAGNOSIS — N179 Acute kidney failure, unspecified: Secondary | ICD-10-CM

## 2022-03-31 DIAGNOSIS — Z720 Tobacco use: Secondary | ICD-10-CM

## 2022-03-31 DIAGNOSIS — G2401 Drug induced subacute dyskinesia: Secondary | ICD-10-CM

## 2022-03-31 DIAGNOSIS — N189 Chronic kidney disease, unspecified: Secondary | ICD-10-CM

## 2022-03-31 DIAGNOSIS — D631 Anemia in chronic kidney disease: Secondary | ICD-10-CM

## 2022-03-31 DIAGNOSIS — F1010 Alcohol abuse, uncomplicated: Secondary | ICD-10-CM

## 2022-03-31 DIAGNOSIS — N185 Chronic kidney disease, stage 5: Secondary | ICD-10-CM

## 2022-03-31 HISTORY — PX: IR PERC TUN PERIT CATH WO PORT S&I /IMAG: IMG2327

## 2022-03-31 HISTORY — PX: IR FLUORO GUIDE CV LINE RIGHT: IMG2283

## 2022-03-31 HISTORY — PX: IR US GUIDE VASC ACCESS RIGHT: IMG2390

## 2022-03-31 LAB — CBC
HCT: 31.3 % — ABNORMAL LOW (ref 39.0–52.0)
Hemoglobin: 10.3 g/dL — ABNORMAL LOW (ref 13.0–17.0)
MCH: 29.9 pg (ref 26.0–34.0)
MCHC: 32.9 g/dL (ref 30.0–36.0)
MCV: 91 fL (ref 80.0–100.0)
Platelets: 248 10*3/uL (ref 150–400)
RBC: 3.44 MIL/uL — ABNORMAL LOW (ref 4.22–5.81)
RDW: 13.6 % (ref 11.5–15.5)
WBC: 7.3 10*3/uL (ref 4.0–10.5)
nRBC: 0 % (ref 0.0–0.2)

## 2022-03-31 LAB — COMPREHENSIVE METABOLIC PANEL
ALT: 14 U/L (ref 0–44)
AST: 16 U/L (ref 15–41)
Albumin: 3.8 g/dL (ref 3.5–5.0)
Alkaline Phosphatase: 79 U/L (ref 38–126)
Anion gap: 9 (ref 5–15)
BUN: 52 mg/dL — ABNORMAL HIGH (ref 6–20)
CO2: 17 mmol/L — ABNORMAL LOW (ref 22–32)
Calcium: 9.5 mg/dL (ref 8.9–10.3)
Chloride: 113 mmol/L — ABNORMAL HIGH (ref 98–111)
Creatinine, Ser: 10.59 mg/dL — ABNORMAL HIGH (ref 0.61–1.24)
GFR, Estimated: 6 mL/min — ABNORMAL LOW (ref >60)
Glucose, Bld: 95 mg/dL (ref 70–99)
Potassium: 3.9 mmol/L (ref 3.5–5.1)
Sodium: 139 mmol/L (ref 135–145)
Total Bilirubin: 0.6 mg/dL (ref 0.3–1.2)
Total Protein: 7.5 g/dL (ref 6.5–8.1)

## 2022-03-31 MED ORDER — HYDROMORPHONE HCL 1 MG/ML IJ SOLN
0.5000 mg | Freq: Once | INTRAMUSCULAR | Status: AC | PRN
  Administered 2022-03-31: 0.5 mg via INTRAVENOUS
  Filled 2022-03-31: qty 1

## 2022-03-31 MED ORDER — FENTANYL CITRATE (PF) 100 MCG/2ML IJ SOLN
INTRAMUSCULAR | Status: AC
  Filled 2022-03-31: qty 2

## 2022-03-31 MED ORDER — FENTANYL CITRATE (PF) 100 MCG/2ML IJ SOLN
INTRAMUSCULAR | Status: AC | PRN
  Administered 2022-03-31 (×2): 50 ug via INTRAVENOUS

## 2022-03-31 MED ORDER — HEPARIN SODIUM (PORCINE) 1000 UNIT/ML IJ SOLN
INTRAMUSCULAR | Status: AC
  Administered 2022-03-31: 1.6 mL
  Filled 2022-03-31: qty 10

## 2022-03-31 MED ORDER — SODIUM CHLORIDE 0.9 % IV SOLN: 250.0000 mL | INTRAVENOUS | Status: DC | PRN

## 2022-03-31 MED ORDER — SODIUM CHLORIDE 0.9% FLUSH: 3.0000 mL | INTRAVENOUS | Status: DC | PRN

## 2022-03-31 MED ORDER — GELATIN ABSORBABLE 12-7 MM EX MISC
1.0000 | Freq: Once | CUTANEOUS | Status: DC
  Filled 2022-03-31: qty 1

## 2022-03-31 MED ORDER — HYDRALAZINE HCL 25 MG PO TABS: 25.0000 mg | ORAL_TABLET | Freq: Four times a day (QID) | ORAL | Status: DC | PRN

## 2022-03-31 MED ORDER — NALOXONE HCL 0.4 MG/ML IJ SOLN: 0.4000 mg | INTRAMUSCULAR | Status: DC | PRN

## 2022-03-31 MED ORDER — CHLORHEXIDINE GLUCONATE CLOTH 2 % EX PADS
6.0000 | MEDICATED_PAD | Freq: Every day | CUTANEOUS | Status: DC
  Administered 2022-04-01 – 2022-04-03 (×3): 6 via TOPICAL

## 2022-03-31 MED ORDER — HYDROCODONE-ACETAMINOPHEN 5-325 MG PO TABS
1.0000 | ORAL_TABLET | ORAL | Status: DC | PRN
  Administered 2022-03-31: 1 via ORAL
  Administered 2022-03-31 – 2022-04-03 (×2): 2 via ORAL
  Filled 2022-03-31 (×3): qty 2
  Filled 2022-03-31: qty 1
  Filled 2022-03-31: qty 2

## 2022-03-31 MED ORDER — ACETAMINOPHEN 325 MG PO TABS
650.0000 mg | ORAL_TABLET | Freq: Four times a day (QID) | ORAL | Status: DC | PRN
  Administered 2022-04-03: 650 mg via ORAL
  Filled 2022-03-31 (×2): qty 2

## 2022-03-31 MED ORDER — ACETAMINOPHEN 650 MG RE SUPP: 650.0000 mg | Freq: Four times a day (QID) | RECTAL | Status: DC | PRN

## 2022-03-31 MED ORDER — AMLODIPINE BESYLATE 5 MG PO TABS
5.0000 mg | ORAL_TABLET | Freq: Every day | ORAL | Status: DC
  Administered 2022-03-31 – 2022-04-04 (×5): 5 mg via ORAL
  Filled 2022-03-31 (×5): qty 1

## 2022-03-31 MED ORDER — MIDAZOLAM HCL 2 MG/2ML IJ SOLN
INTRAMUSCULAR | Status: AC | PRN
  Administered 2022-03-31 (×2): 1 mg via INTRAVENOUS

## 2022-03-31 MED ORDER — CEFAZOLIN SODIUM-DEXTROSE 2-4 GM/100ML-% IV SOLN
INTRAVENOUS | Status: AC
  Filled 2022-03-31: qty 100

## 2022-03-31 MED ORDER — LIDOCAINE-EPINEPHRINE 1 %-1:100000 IJ SOLN
INTRAMUSCULAR | Status: AC
  Administered 2022-03-31: 10 mL via INTRADERMAL
  Filled 2022-03-31: qty 1

## 2022-03-31 MED ORDER — CEFAZOLIN SODIUM-DEXTROSE 2-4 GM/100ML-% IV SOLN
2.0000 g | INTRAVENOUS | Status: AC
  Administered 2022-03-31: 2 g via INTRAVENOUS

## 2022-03-31 MED ORDER — MIDAZOLAM HCL 2 MG/2ML IJ SOLN
INTRAMUSCULAR | Status: AC
  Filled 2022-03-31: qty 4

## 2022-03-31 MED ORDER — SODIUM CHLORIDE 0.9% FLUSH
3.0000 mL | Freq: Two times a day (BID) | INTRAVENOUS | Status: DC
  Administered 2022-03-31 – 2022-04-03 (×7): 3 mL via INTRAVENOUS

## 2022-03-31 MED ORDER — GELATIN ABSORBABLE 12-7 MM EX MISC
CUTANEOUS | Status: AC
  Administered 2022-03-31: 1
  Filled 2022-03-31: qty 1

## 2022-03-31 NOTE — ED Notes (Signed)
Attempted to call report but no one picked up the phone. Will attempt again later.

## 2022-03-31 NOTE — Consult Note (Signed)
Chief Complaint: Patient was seen in consultation today for  Chief Complaint  Patient presents with   abnormal labs   at the request of nephrology  Supervising Physician: Markus Daft  Patient Status: [facility] - ED  History of Present Illness: William Gregory is a 36 y.o. male with AKI/CKD vs progression to ESRD.  Nephrology requesting tunneled dialysis cath for initiation of dialysis.  Patient was seen in consult on 03/21/22 for same and an H&P was performed.  No catheter placed at that time as patient eloped from ED.  No new changes in patient history or presentation.  He reports feeling "great" and with no complaints today.  Past Medical History:  Diagnosis Date   Alcohol abuse    Asthma    Back pain    Depression    Knee joint pain, left    Neck pain    Psychiatric problem    unspecified by patient (receives "injections" bi-weekly)   Tobacco abuse     Past Surgical History:  Procedure Laterality Date   arm surgery      Allergies: Aspirin and Ibuprofen  Medications: Prior to Admission medications   Medication Sig Start Date End Date Taking? Authorizing Provider  acetaminophen (TYLENOL) 500 MG tablet Take 500 mg by mouth every 6 (six) hours as needed for moderate pain.   Yes [provider]  albuterol (VENTOLIN HFA) 108 (90 Base) MCG/ACT inhaler Inhale 2 puffs into the lungs every 4 (four) hours as needed for wheezing or shortness of breath. 02/12/22  Yes Barrett Henle, MD  amLODipine (NORVASC) 5 MG tablet Take 1 tablet (5 mg total) by mouth daily. 02/12/22  Yes Barrett Henle, MD  paliperidone Palmitate ER (INVEGA TRINZA) 819 MG/2.63ML injection Inject 819 mg into the muscle every 3 (three) months.   Yes [provider]     Family History  Problem Relation Age of Onset   Diabetes Mother    Diabetes Father    CVA Father    Dementia Maternal Grandmother    Kidney disease Maternal Grandmother    Heart attack Neg Hx     Social History    Socioeconomic History   Marital status: Single    Spouse name: Not on file   Number of children: Not on file   Years of education: Not on file   Highest education level: Not on file  Occupational History   Not on file  Tobacco Use   Smoking status: Every Day    Packs/day: 1.00    Types: Cigarettes   Smokeless tobacco: Never  Vaping Use   Vaping Use: Former  Substance and Sexual Activity   Alcohol use: Not Currently    Comment: sober x "months"   Drug use: Yes    Types: Cocaine, Marijuana    Comment: Hasn't used cocaine in "months"   Sexual activity: Yes  Other Topics Concern   Not on file  Social History Narrative   Right handed   Unemployed/disability   Ninth grade level of education   Social Determinants of Health   Financial Resource Strain: Not on file  Food Insecurity: No Food Insecurity (03/30/2022)   Hunger Vital Sign    Worried About Running Out of Food in the Last Year: Never true    Ran Out of Food in the Last Year: Never true  Recent Concern: Food Insecurity - Food Insecurity Present (03/20/2022)   Hunger Vital Sign    Worried About Running Out of Food in the  Last Year: Sometimes true    Ran Out of Food in the Last Year: Patient refused  Transportation Needs: No Transportation Needs (03/30/2022)   PRAPARE - Hydrologist (Medical): No    Lack of Transportation (Non-Medical): No  Physical Activity: Not on file  Stress: Not on file  Social Connections: Not on file    Review of Systems: A 12 point ROS discussed and pertinent positives are indicated in the HPI above.  All other systems are negative.  Review of Systems  Constitutional:  Negative for chills and fever.  HENT: Negative.    Eyes: Negative.   Respiratory: Negative.    Cardiovascular: Negative.   Gastrointestinal: Negative.   Genitourinary: Negative.   Musculoskeletal: Negative.   Skin: Negative.   Neurological: Negative.      Vital Signs: BP (!) 151/112  (BP Location: Left Arm)   Pulse 63   Temp 97.6 F (36.4 C) (Oral)   Resp 18   SpO2 99%     Physical Exam Constitutional:      Comments: Tremor obvious of right side of body.  HENT:     Mouth/Throat:     Mouth: Mucous membranes are dry.     Pharynx: Oropharynx is clear.  Eyes:     Extraocular Movements: Extraocular movements intact.  Cardiovascular:     Rate and Rhythm: Normal rate.     Pulses: Normal pulses.     Heart sounds: Normal heart sounds.  Pulmonary:     Effort: Pulmonary effort is normal.     Comments: Solely mouth breathing Skin:    General: Skin is warm and dry.  Neurological:     Mental Status: He is alert and oriented to person, place, and time.     Comments: Tongue hangs out of mouth and deviates to right side Right sided tremor affects face and upper extremity  Psychiatric:        Mood and Affect: Affect is labile.        Behavior: Behavior is cooperative.        Judgment: Judgment is impulsive.     Imaging: DG CHEST PORT 1 VIEW  Result Date: 03/30/2022 CLINICAL DATA:  Acute renal insufficiency. EXAM: PORTABLE CHEST 1 VIEW COMPARISON:  Chest radiograph dated 03/20/2022. FINDINGS: The heart size and mediastinal contours are within normal limits. Both lungs are clear. The visualized skeletal structures are unremarkable. IMPRESSION: No active disease. Electronically Signed   By: Anner Crete M.D.   On: 03/30/2022 20:14   US RENAL  Result Date: 03/20/2022 CLINICAL DATA:  Acute kidney injury EXAM: RENAL / URINARY TRACT ULTRASOUND COMPLETE COMPARISON:  CT 08/14/2015 FINDINGS: Right Kidney: Renal measurements: 8.4 x 4.7 x 4.9 cm = volume: 102.2 mL. Echogenic cortex. No hydronephrosis. Fewer than 10 cysts in the right kidney. The largest is seen at the upper pole and measures 21 mm. No follow-up imaging is recommended. Left Kidney: Renal measurements: 7.4 x 3.9 x 5 cm = volume: 75.5 mL. Echogenic cortex. No hydronephrosis. No mass. Bladder: Slightly thick  walled in appearance. Prevoid bladder volume 203.5 mL. Patient was unable to void. Other: None. IMPRESSION: 1. Echogenic kidneys bilaterally consistent with medical renal disease. No hydronephrosis. 2. Nonspecific thick-walled appearance of the bladder Electronically Signed   By: Donavan Foil M.D.   On: 03/20/2022 22:18   DG Chest 2 View  Result Date: 03/20/2022 CLINICAL DATA:  Shortness of breath EXAM: CHEST - 2 VIEW COMPARISON:  04/21/2018 FINDINGS: The heart size  and mediastinal contours are within normal limits. Both lungs are clear. The visualized skeletal structures are unremarkable. IMPRESSION: No active cardiopulmonary disease. Electronically Signed   By: Van Clines M.D.   On: 03/20/2022 18:20    Labs:  CBC: Recent Labs    03/21/22 0019 03/29/22 1051 03/30/22 1548 03/31/22 0420  WBC 9.7 7.2 8.9 7.3  HGB 9.6* 10.4* 10.3* 10.3*  HCT 28.8* 31.1* 31.1* 31.3*  PLT 258 273 268 248    COAGS: No results for input(s): "INR", "APTT" in the last 8760 hours.  BMP: Recent Labs    03/20/22 1718 03/20/22 1741 03/21/22 0019 03/29/22 1051 03/30/22 1548 03/31/22 0420  NA 138   < > 139 144 141 139  K 3.7   < > 3.7 4.0 3.6 3.9  CL 109   < > 110 109* 112* 113*  CO2 17*  --  18* 16* 18* 17*  GLUCOSE 109*   < > 92 76 122* 95  BUN 46*   < > 46* 45* 45* 52*  CALCIUM 9.6  --  9.1 9.9 9.7 9.5  CREATININE 10.27*   < > 9.47* 10.03* 10.87* 10.59*  GFRNONAA 6*  --  7*  --  6* 6*   < > = values in this interval not displayed.    LIVER FUNCTION TESTS: Recent Labs    03/20/22 1054 03/20/22 1718 03/21/22 0019 03/30/22 1548 03/31/22 0420  BILITOT 0.4 0.7  --  0.7 0.6  AST 14 16  --  21 16  ALT 12 14  --  15 14  ALKPHOS 95 81  --  83 79  PROT 8.2 7.5  --  7.9 7.5  ALBUMIN 4.5 3.9 3.4* 4.2 3.8    Assessment and Plan:  AKI/CKD vs ESRD --patient in need of dialysis access --IR rad will need to determine right vs left sided placement secondary to right-sided prominent  tremor --plan is to transfer to [facility] for placement and initiation of treatment --Mother and patient in agreement to proceed.   --encouraged not leaving hospital when frustrated, but taking a moment to address the conflict, ultimately advocating for his own health --OK for a few ice chips.  Pt to remain PTO otherwise in anticipation of placement of TDC tentatively planned for today if transfer to [facility] and IR can accommodate. --Consent signed and in ED chart.  Risks and benefits discussed with the patient including, but not limited to bleeding, infection, vascular injury, pneumothorax which may require chest tube placement, air embolism or even death  All of the patient's questions were answered, patient is agreeable to proceed. Consent signed and in chart.   Thank you for this interesting consult.  I greatly enjoyed meeting Lewis E Pollan and look forward to participating in their care.  A copy of this report was sent to the requesting provider on this date.  Electronically Signed: Pasty Spillers, PA 03/31/2022, 11:55 AM   I spent a total of 40 Minutes in face to face in clinical consultation, greater than 50% of which was counseling/coordinating care for dialysis catheter placement.

## 2022-03-31 NOTE — Progress Notes (Signed)
PROGRESS NOTE    William Gregory  TTS:[id number] DOB: 08-08-1985 DOA: 03/30/2022 PCP: Patient, No Pcp Per    Brief Narrative:   William Gregory is a 36 y.o. male with past medical history significant for essential hypertension, polysubstance use, anemia of chronic disease, schizophrenia, tardive dyskinesia, CKD stage V now ESRD, morbid obesity, tobacco use disorder presented to [facility] ED on 10/26 from his PCP office for abnormal labs.  Patient was recently diagnosed with ESRD and was due to start hemodialysis but unfortunately left AGAINST MEDICAL ADVICE.  Now creatinine up to 10 and patient agreeable for hospitalization to initiate dialysis at this time.  He still reports making urine, no other complaints.  No confusion, no nausea/vomiting/diarrhea.  In the ED, temperature 97.8 F, HR 91, RR 16, BP 160/130, SPO2 99% on room air.  WBC 8.9, hemoglobin 10.3, platelets 268.  Sodium 141, potassium 3.6, chloride 112, CO2 18, glucose 122, BUN 45, creatinine 10.87.  AST 21, ALT 15, total bilirubin 0.7.  Assessment & Plan:   CKD stage V now ESRD Patient presenting to ED with worsening creatinine.  Was previously recommended to start dialysis but unfortunate left AMA but willing to initiate now.  Creatinine on admission elevated 10.87, potassium within normal limits. -- Nephrology following, appreciate assistance -- Cr 10.87>10.59 -- IR consulted for temporary HD catheter placement -- Will need vascular surgery evaluation for AVF placement upon transfer to [facility] -- BMP daily -- N.p.o. until HD catheter placement  Essential hypertension -- Amlodipine 5 mg p.o. daily -- Hydralazine 25 mg p.o. q6h PRN SBP >165 or DBP >110  Anemia of chronic medical/renal disease Anemia panel with iron 49, TIBC 265, ferritin 134, vitamin S92 330, folic acid 5.6.  Hemoglobin 10.3, stable.  Schizophrenia Tardive dyskinesia -- Patient receives Invega injection every 3 months  History of polysubstance  abuse/cocaine abuse Patient reports no cocaine use over the last 2-3 years.  Counseled on need for complete cessation.  Tobacco use disorder Continues to endorse tobacco abuse.  Counseled on need for complete cessation -- Nicotine patch  Morbid obesity BMI 41.20 kg/m.  Discussed with patient needs for aggressive lifestyle changes/weight loss as this complicates all facets of care.  Outpatient follow-up with PCP.     DVT prophylaxis: SCDs Start: 03/31/22 0762    Code Status: Full Code Family Communication: Updated patient's mother present at bedside this morning  Disposition Plan:  Level of care: Telemetry Medical Status is: Inpatient Remains inpatient appropriate because: Pending transfer to [facility], IR placement of tunneled HD dialysis catheter and initiation of hemodialysis    Consultants:  Nephrology  Procedures:  None  Antimicrobials:  None   Subjective: Patient seen examined bedside, resting comfortably.  Lying in bed.  Mother present.  Patient requesting something to eat.  Discussed with patient and mother at bedside and needs to remain n.p.o. until hemodialysis catheter is placed today.  Pending transfer to [facility] to initiate hemodialysis.  Mother asked if "the catheter placement will affect his ability to perform his work".  Discussed that unless he receives hemodialysis; his mortality will be very high.  No other specific questions or concerns at this time.  Patient denies headache, no dizziness, no chest pain, no palpitations, no shortness of breath, no abdominal pain, no fever/chills/night sweats, no nausea/vomiting/diarrhea, no focal weakness, no fatigue, no paresthesias.  No acute events overnight per nursing staff.  Objective: Vitals:   03/31/22 0222 03/31/22 0400 03/31/22 0500 03/31/22 0700  BP:  Marland Kitchen)  164/111 (!) 173/114 (!) 147/118  Pulse:  61 81 78  Resp:  13 (!) 26 (!) 23  Temp: 98.3 F (36.8 C)   98.3 F (36.8 C)  TempSrc:    Oral  SpO2:  99% 98%  100%   No intake or output data in the 24 hours ending 03/31/22 1026 There were no vitals filed for this visit.  Examination:  Physical Exam: GEN: NAD, alert and oriented x 3, chronically ill in appearance, appears older than stated age obese HEENT: NCAT, PERRL, EOMI, sclera clear, MMM PULM: CTAB w/o wheezes/crackles, normal respiratory effort, room air CV: RRR w/o M/G/R GI: abd soft, NTND, NABS, no R/G/M MSK: no peripheral edema, moves all extremities independently NEURO: No focal abnormality other than his rhythmic tardive dyskinesia movements PSYCH: Anxious mood, normal affect Integumentary: dry/intact, no rashes or wounds    Data Reviewed: I have personally reviewed following labs and imaging studies  CBC: Recent Labs  Lab 03/29/22 1051 03/30/22 1548 03/31/22 0420  WBC 7.2 8.9 7.3  HGB 10.4* 10.3* 10.3*  HCT 31.1* 31.1* 31.3*  MCV 88 90.4 91.0  PLT 273 268 299   Basic Metabolic Panel: Recent Labs  Lab 03/29/22 1051 03/30/22 1548 03/31/22 0420  NA 144 141 139  K 4.0 3.6 3.9  CL 109* 112* 113*  CO2 16* 18* 17*  GLUCOSE 76 122* 95  BUN 45* 45* 52*  CREATININE 10.03* 10.87* 10.59*  CALCIUM 9.9 9.7 9.5  MG  --  2.2  --    GFR: Estimated Creatinine Clearance: 10.9 mL/min (A) (by C-G formula based on SCr of 10.59 mg/dL (H)). Liver Function Tests: Recent Labs  Lab 03/30/22 1548 03/31/22 0420  AST 21 16  ALT 15 14  ALKPHOS 83 79  BILITOT 0.7 0.6  PROT 7.9 7.5  ALBUMIN 4.2 3.8   No results for input(s): "LIPASE", "AMYLASE" in the last 168 hours. No results for input(s): "AMMONIA" in the last 168 hours. Coagulation Profile: No results for input(s): "INR", "PROTIME" in the last 168 hours. Cardiac Enzymes: Recent Labs  Lab 03/29/22 1051  CKTOTAL 1,207*   BNP (last 3 results) No results for input(s): "PROBNP" in the last 8760 hours. HbA1C: Recent Labs    03/29/22 1118  HGBA1C 5.6   CBG: No results for input(s): "GLUCAP" in the last 168  hours. Lipid Profile: No results for input(s): "CHOL", "HDL", "LDLCALC", "TRIG", "CHOLHDL", "LDLDIRECT" in the last 72 hours. Thyroid Function Tests: No results for input(s): "TSH", "T4TOTAL", "FREET4", "T3FREE", "THYROIDAB" in the last 72 hours. Anemia Panel: Recent Labs    03/30/22 1548 03/30/22 2144  VITAMINB12  --  548  FOLATE  --  5.6*  FERRITIN  --  134  TIBC  --  265  IRON  --  49  RETICCTPCT 1.9  --    Sepsis Labs: No results for input(s): "PROCALCITON", "LATICACIDVEN" in the last 168 hours.  No results found for this or any previous visit (from the past 240 hour(s)).       Radiology Studies: DG CHEST PORT 1 VIEW  Result Date: 03/30/2022 CLINICAL DATA:  Acute renal insufficiency. EXAM: PORTABLE CHEST 1 VIEW COMPARISON:  Chest radiograph dated 03/20/2022. FINDINGS: The heart size and mediastinal contours are within normal limits. Both lungs are clear. The visualized skeletal structures are unremarkable. IMPRESSION: No active disease. Electronically Signed   By: Anner Crete M.D.   On: 03/30/2022 20:14        Scheduled Meds:  amLODipine  5 mg  Oral Daily   nicotine  14 mg Transdermal Daily   sodium chloride flush  3 mL Intravenous Q12H   Continuous Infusions:  sodium chloride       LOS: 1 day    Time spent: 52 minutes spent on chart review, discussion with nursing staff, consultants, updating family and interview/physical exam; more than 50% of that time was spent in counseling and/or coordination of care.    Dell Hurtubise J [place] DO [facility] Available via Epic secure chat 7am-7pm After these hours, please refer to coverage provider listed on [email or web address] 03/31/2022, 10:26 AM

## 2022-03-31 NOTE — Procedures (Signed)
Interventional Radiology Procedure:   Indications: Renal failure and starting dialysis  Procedure: Placement of tunneled dialysis catheter  Findings: Right jugular Palindrome, tip at SVC/ RA junction.    Complications: No immediate complications noted.     EBL: Minimal  Plan: Catheter is ready to use.    Rad Gramling R. Anselm Pancoast, MD  Pager: [phone]

## 2022-04-01 DIAGNOSIS — F1010 Alcohol abuse, uncomplicated: Secondary | ICD-10-CM | POA: Diagnosis not present

## 2022-04-01 DIAGNOSIS — N179 Acute kidney failure, unspecified: Secondary | ICD-10-CM | POA: Diagnosis not present

## 2022-04-01 DIAGNOSIS — N185 Chronic kidney disease, stage 5: Secondary | ICD-10-CM | POA: Diagnosis not present

## 2022-04-01 LAB — RENAL FUNCTION PANEL
Albumin: 3.6 g/dL (ref 3.5–5.0)
Anion gap: 8 (ref 5–15)
BUN: 52 mg/dL — ABNORMAL HIGH (ref 6–20)
CO2: 18 mmol/L — ABNORMAL LOW (ref 22–32)
Calcium: 9.2 mg/dL (ref 8.9–10.3)
Chloride: 110 mmol/L (ref 98–111)
Creatinine, Ser: 11.27 mg/dL — ABNORMAL HIGH (ref 0.61–1.24)
GFR, Estimated: 5 mL/min — ABNORMAL LOW (ref >60)
Glucose, Bld: 116 mg/dL — ABNORMAL HIGH (ref 70–99)
Phosphorus: 5.8 mg/dL — ABNORMAL HIGH (ref 2.5–4.6)
Potassium: 3.4 mmol/L — ABNORMAL LOW (ref 3.5–5.1)
Sodium: 136 mmol/L (ref 135–145)

## 2022-04-01 LAB — CBC
HCT: 30.7 % — ABNORMAL LOW (ref 39.0–52.0)
Hemoglobin: 10 g/dL — ABNORMAL LOW (ref 13.0–17.0)
MCH: 30 pg (ref 26.0–34.0)
MCHC: 32.6 g/dL (ref 30.0–36.0)
MCV: 92.2 fL (ref 80.0–100.0)
Platelets: 262 10*3/uL (ref 150–400)
RBC: 3.33 MIL/uL — ABNORMAL LOW (ref 4.22–5.81)
RDW: 13.7 % (ref 11.5–15.5)
WBC: 9 10*3/uL (ref 4.0–10.5)
nRBC: 0 % (ref 0.0–0.2)

## 2022-04-01 LAB — HEPATITIS B SURFACE ANTIGEN: Hepatitis B Surface Ag: NONREACTIVE

## 2022-04-01 MED ORDER — CHLORHEXIDINE GLUCONATE CLOTH 2 % EX PADS: 6.0000 | MEDICATED_PAD | Freq: Every day | CUTANEOUS | Status: DC

## 2022-04-01 MED ORDER — HYDRALAZINE HCL 20 MG/ML IJ SOLN: 10.0000 mg | INTRAMUSCULAR | Status: DC | PRN

## 2022-04-01 MED ORDER — METOPROLOL TARTRATE 5 MG/5ML IV SOLN: 5.0000 mg | INTRAVENOUS | Status: DC | PRN

## 2022-04-01 MED ORDER — ALTEPLASE 2 MG IJ SOLR: 2.0000 mg | Freq: Once | INTRAMUSCULAR | Status: DC | PRN

## 2022-04-01 MED ORDER — SENNOSIDES-DOCUSATE SODIUM 8.6-50 MG PO TABS: 1.0000 | ORAL_TABLET | Freq: Every evening | ORAL | Status: DC | PRN

## 2022-04-01 MED ORDER — ANTICOAGULANT SODIUM CITRATE 4% (200MG/5ML) IV SOLN
5.0000 mL | Status: DC | PRN
  Filled 2022-04-01: qty 5

## 2022-04-01 MED ORDER — HEPARIN SODIUM (PORCINE) 1000 UNIT/ML DIALYSIS
1000.0000 [IU] | INTRAMUSCULAR | Status: DC | PRN
  Administered 2022-04-01: 1000 [IU]
  Filled 2022-04-01: qty 1

## 2022-04-01 MED ORDER — HYDROMORPHONE HCL 1 MG/ML IJ SOLN
0.5000 mg | INTRAMUSCULAR | Status: AC | PRN
  Administered 2022-04-01: 0.5 mg via INTRAVENOUS
  Filled 2022-04-01: qty 1

## 2022-04-01 MED ORDER — FOLIC ACID 1 MG PO TABS
1.0000 mg | ORAL_TABLET | Freq: Every day | ORAL | Status: DC
  Administered 2022-04-01 – 2022-04-04 (×4): 1 mg via ORAL
  Filled 2022-04-01 (×4): qty 1

## 2022-04-01 MED ORDER — IPRATROPIUM-ALBUTEROL 0.5-2.5 (3) MG/3ML IN SOLN: 3.0000 mL | RESPIRATORY_TRACT | Status: DC | PRN

## 2022-04-01 MED ORDER — TRAZODONE HCL 50 MG PO TABS
50.0000 mg | ORAL_TABLET | Freq: Every evening | ORAL | Status: DC | PRN
  Filled 2022-04-01: qty 1

## 2022-04-01 MED ORDER — DOCUSATE SODIUM 100 MG PO CAPS
100.0000 mg | ORAL_CAPSULE | Freq: Two times a day (BID) | ORAL | Status: DC
  Administered 2022-04-02 – 2022-04-03 (×3): 100 mg via ORAL
  Filled 2022-04-01 (×6): qty 1

## 2022-04-01 MED ORDER — GUAIFENESIN 100 MG/5ML PO LIQD: 5.0000 mL | ORAL | Status: DC | PRN

## 2022-04-01 NOTE — Progress Notes (Signed)
PROGRESS NOTE    William Gregory  RXY:[id number] DOB: 07-27-1985 DOA: 03/30/2022 PCP: Patient, No Pcp Per   Brief Narrative:  36 year old with history of HTN, polysubstance abuse, anemia of chronic disease, schizophrenia, tardive dyskinesia, CKD stage V now ESRD, morbid obesity, tobacco use came to the ED at [facility] on 10/26 from PCPs office for abnormal lab.  Initially had left AGAINST MEDICAL ADVICE but returned back to the hospital and now agreeable for dialysis.  Transferred here from [place] long to initiate HD.   Assessment & Plan:  Principal Problem:   Renal failure Active Problems:   Alcohol abuse   Tobacco abuse   Anemia   Tardive dyskinesia   Essential hypertension    CKD stage V now ESRD -Creatinine is now up to 10.  Nephrology and IR consulted.  IR placed tunneled catheter to initiate HD.  Will defer to nephro for vascular consult for permanent access   Essential hypertension, uncontrolled -Norvasc 5 mg daily.  IV as needed ordered Hopefully BP will improve with HD, if not we will increase Norvasc   Anemia of chronic medical/renal disease anemia of chronic disease, hemoglobin 10.  Should get EPO/aranesp with HD, will defer to nephrology.  Folate deficiency - Supplements   Schizophrenia Tardive dyskinesia -Patient receives Invega injection every 3 months   History of polysubstance abuse/cocaine abuse Patient reports no cocaine use over the last 2-3 years.  Counseled on need for complete cessation.   Tobacco use disorder Continues to endorse tobacco abuse.  Counseled on need for complete cessation -- Nicotine patch   Morbid obesity BMI 41.20 kg/m.  Discussed with patient needs for aggressive lifestyle changes/weight loss as this complicates all facets of care.  Outpatient follow-up with PCP.          DVT prophylaxis: SCDs Start: 03/31/22 0412 Code Status: Full Code Family Communication:    Status is: Inpatient Remains inpatient appropriate  because: Starting HD during this admission by Nephro.       Subjective: Seen and examined at bedside, does not have any new complaints.  Some discomfort around the right sided chest wall around the catheter insertion site.   Examination:  General exam: Appears calm and comfortable  Respiratory system: Clear to auscultation. Respiratory effort normal. Cardiovascular system: S1 & S2 heard, RRR. No JVD, murmurs, rubs, gallops or clicks. No pedal edema. Gastrointestinal system: Abdomen is nondistended, soft and nontender. No organomegaly or masses felt. Normal bowel sounds heard. Central nervous system: Alert and oriented. No focal neurological deficits. Extremities: Symmetric 5 x 5 power. Skin: No rashes, lesions or ulcers Psychiatry: Judgement and insight appear normal. Mood & affect appropriate.  Right chest wall tunneled catheter in place   Objective: Vitals:   03/31/22 1948 04/01/22 0103 04/01/22 0500 04/01/22 0545  BP: (!) 176/113 (!) 150/104  (!) 160/111  Pulse: 96 76  65  Resp: 19   18  Temp: 98.3 F (36.8 C) 98.3 F (36.8 C)  97.8 F (36.6 C)  TempSrc:  Oral  Oral  SpO2: 99% 96%  99%  Height:   5\' 5"  (1.651 m)     Intake/Output Summary (Last 24 hours) at 04/01/2022 0802 Last data filed at 04/01/2022 0600 Gross per 24 hour  Intake 680 ml  Output 0 ml  Net 680 ml   There were no vitals filed for this visit.   Data Reviewed:   CBC: Recent Labs  Lab 03/29/22 1051 03/30/22 1548 03/31/22 0420 04/01/22 0220  WBC 7.2 8.9  7.3 9.0  HGB 10.4* 10.3* 10.3* 10.0*  HCT 31.1* 31.1* 31.3* 30.7*  MCV 88 90.4 91.0 92.2  PLT 273 268 248 347   Basic Metabolic Panel: Recent Labs  Lab 03/29/22 1051 03/30/22 1548 03/31/22 0420 04/01/22 0220  NA 144 141 139 136  K 4.0 3.6 3.9 3.4*  CL 109* 112* 113* 110  CO2 16* 18* 17* 18*  GLUCOSE 76 122* 95 116*  BUN 45* 45* 52* 52*  CREATININE 10.03* 10.87* 10.59* 11.27*  CALCIUM 9.9 9.7 9.5 9.2  MG  --  2.2  --   --    PHOS  --   --   --  5.8*   GFR: Estimated Creatinine Clearance: 10.3 mL/min (A) (by C-G formula based on SCr of 11.27 mg/dL (H)). Liver Function Tests: Recent Labs  Lab 03/30/22 1548 03/31/22 0420 04/01/22 0220  AST 21 16  --   ALT 15 14  --   ALKPHOS 83 79  --   BILITOT 0.7 0.6  --   PROT 7.9 7.5  --   ALBUMIN 4.2 3.8 3.6   No results for input(s): "LIPASE", "AMYLASE" in the last 168 hours. No results for input(s): "AMMONIA" in the last 168 hours. Coagulation Profile: No results for input(s): "INR", "PROTIME" in the last 168 hours. Cardiac Enzymes: Recent Labs  Lab 03/29/22 1051  CKTOTAL 1,207*   BNP (last 3 results) No results for input(s): "PROBNP" in the last 8760 hours. HbA1C: Recent Labs    03/29/22 1118  HGBA1C 5.6   CBG: No results for input(s): "GLUCAP" in the last 168 hours. Lipid Profile: No results for input(s): "CHOL", "HDL", "LDLCALC", "TRIG", "CHOLHDL", "LDLDIRECT" in the last 72 hours. Thyroid Function Tests: No results for input(s): "TSH", "T4TOTAL", "FREET4", "T3FREE", "THYROIDAB" in the last 72 hours. Anemia Panel: Recent Labs    03/30/22 1548 03/30/22 2144  VITAMINB12  --  548  FOLATE  --  5.6*  FERRITIN  --  134  TIBC  --  265  IRON  --  49  RETICCTPCT 1.9  --    Sepsis Labs: No results for input(s): "PROCALCITON", "LATICACIDVEN" in the last 168 hours.  No results found for this or any previous visit (from the past 240 hour(s)).       Radiology Studies: IR TUNNELED CENTRAL VENOUS CATHETER PLACEMENT  Result Date: 03/31/2022 INDICATION: 36 year old with end-stage renal disease and needs a tunneled dialysis catheter. EXAM: FLUOROSCOPIC AND ULTRASOUND GUIDED PLACEMENT OF A TUNNELED DIALYSIS CATHETER Physician: Stephan Minister. Henn, MD MEDICATIONS: Ancef 2 g ANESTHESIA/SEDATION: Moderate (conscious) sedation was employed during this procedure. A total of Versed 2mg  and fentanyl 100 mcg was administered intravenously at the order of the  provider performing the procedure. Total intra-service moderate sedation time: 18 minutes. Patient's level of consciousness and vital signs were monitored continuously by radiology nurse throughout the procedure under the supervision of the provider performing the procedure. FLUOROSCOPY TIME:  Radiation Exposure Index (as provided by the fluoroscopic device): 5 mGy Kerma COMPLICATIONS: None immediate. PROCEDURE: Informed consent was obtained for placement of a tunneled dialysis catheter. The patient was placed supine on the interventional table. Ultrasound confirmed a patent right internal jugular vein. Ultrasound image obtained for documentation. The right neck and chest was prepped and draped in a sterile fashion. Maximal barrier sterile technique was utilized including caps, mask, sterile gowns, sterile gloves, sterile drape, hand hygiene and skin antiseptic. The right neck was anesthetized with 1% lidocaine. A small incision was made with #11 blade  scalpel. A 21 gauge needle directed into the right internal jugular vein with ultrasound guidance. A micropuncture dilator set was placed. A 19 cm tip to cuff Palindrome catheter was selected. The skin below the right clavicle was anesthetized and a small incision was made with an #11 blade scalpel. A subcutaneous tunnel was formed to the vein dermatotomy site. The catheter was brought through the tunnel. The vein dermatotomy site was dilated to accommodate a peel-away sheath. The catheter was placed through the peel-away sheath and directed into the central venous structures. The tip of the catheter was placed at superior cavoatrial junction with fluoroscopy. Fluoroscopic images were obtained for documentation. Both lumens were found to aspirate and flush well. The proper amount of heparin was flushed in both lumens. The vein dermatotomy site was closed using a single layer of absorbable suture and Dermabond. Gel-Foam placed in subcutaneous tract. The catheter was  secured to the skin using Prolene suture. IMPRESSION: Successful placement of a right jugular tunneled dialysis catheter using ultrasound and fluoroscopic guidance. Electronically Signed   By: Markus Daft M.D.   On: 03/31/2022 18:37   IR US Guide Vasc Access Right  Result Date: 03/31/2022 INDICATION: 36 year old with end-stage renal disease and needs a tunneled dialysis catheter. EXAM: FLUOROSCOPIC AND ULTRASOUND GUIDED PLACEMENT OF A TUNNELED DIALYSIS CATHETER Physician: Stephan Minister. Henn, MD MEDICATIONS: Ancef 2 g ANESTHESIA/SEDATION: Moderate (conscious) sedation was employed during this procedure. A total of Versed 2mg  and fentanyl 100 mcg was administered intravenously at the order of the provider performing the procedure. Total intra-service moderate sedation time: 18 minutes. Patient's level of consciousness and vital signs were monitored continuously by radiology nurse throughout the procedure under the supervision of the provider performing the procedure. FLUOROSCOPY TIME:  Radiation Exposure Index (as provided by the fluoroscopic device): 5 mGy Kerma COMPLICATIONS: None immediate. PROCEDURE: Informed consent was obtained for placement of a tunneled dialysis catheter. The patient was placed supine on the interventional table. Ultrasound confirmed a patent right internal jugular vein. Ultrasound image obtained for documentation. The right neck and chest was prepped and draped in a sterile fashion. Maximal barrier sterile technique was utilized including caps, mask, sterile gowns, sterile gloves, sterile drape, hand hygiene and skin antiseptic. The right neck was anesthetized with 1% lidocaine. A small incision was made with #11 blade scalpel. A 21 gauge needle directed into the right internal jugular vein with ultrasound guidance. A micropuncture dilator set was placed. A 19 cm tip to cuff Palindrome catheter was selected. The skin below the right clavicle was anesthetized and a small incision was made with  an #11 blade scalpel. A subcutaneous tunnel was formed to the vein dermatotomy site. The catheter was brought through the tunnel. The vein dermatotomy site was dilated to accommodate a peel-away sheath. The catheter was placed through the peel-away sheath and directed into the central venous structures. The tip of the catheter was placed at superior cavoatrial junction with fluoroscopy. Fluoroscopic images were obtained for documentation. Both lumens were found to aspirate and flush well. The proper amount of heparin was flushed in both lumens. The vein dermatotomy site was closed using a single layer of absorbable suture and Dermabond. Gel-Foam placed in subcutaneous tract. The catheter was secured to the skin using Prolene suture. IMPRESSION: Successful placement of a right jugular tunneled dialysis catheter using ultrasound and fluoroscopic guidance. Electronically Signed   By: Markus Daft M.D.   On: 03/31/2022 18:37   DG CHEST PORT 1 VIEW  Result Date: 03/30/2022  CLINICAL DATA:  Acute renal insufficiency. EXAM: PORTABLE CHEST 1 VIEW COMPARISON:  Chest radiograph dated 03/20/2022. FINDINGS: The heart size and mediastinal contours are within normal limits. Both lungs are clear. The visualized skeletal structures are unremarkable. IMPRESSION: No active disease. Electronically Signed   By: Anner Crete M.D.   On: 03/30/2022 20:14        Scheduled Meds:  amLODipine  5 mg Oral Daily   Chlorhexidine Gluconate Cloth  6 each Topical Daily   gelatin adsorbable  1 each Topical Once   nicotine  14 mg Transdermal Daily   sodium chloride flush  3 mL Intravenous Q12H   Continuous Infusions:  sodium chloride       LOS: 2 days   Time spent= 35 mins    Tama Grosz Arsenio Loader, MD [facility]  If 7PM-7AM, please contact night-coverage  04/01/2022, 8:02 AM

## 2022-04-01 NOTE — Progress Notes (Signed)
   04/01/22 1733  Vitals  Temp 98.7 F (37.1 C)  Temp Source Oral  BP (!) 147/107  BP Location Left Arm  BP Method Automatic  Patient Position (if appropriate) Lying  Pulse Rate 74  Pulse Rate Source Monitor  Resp 13  Oxygen Therapy  SpO2 100 %  O2 Device Room Air  Pulse Oximetry Type Continuous   Received patient in bed to unit.  Alert and oriented.  Informed consent signed and in chart.   Treatment initiated: 1512 Treatment completed: 1718  Patient tolerated well.  Transported back to the room  Alert, without acute distress.  Hand-off given to patient's nurse.   Access used: HD cath Access issues: increase AP alarms requiring lines to be reversed  Total UF removed: kept even per MD orders Medication(s) given: Heparin dwells 3200 units Post HD VS: see above Post HD weight: 113.2kg   [facility]

## 2022-04-01 NOTE — Consult Note (Signed)
[facility]  INPATIENT CONSULTATION  Reason for Consultation: ESRD Requesting Provider: Dr. Reesa Chew  HPI: William Gregory is an 36 y.o. male with HTN, progressive CKD, GERD, h/o ETOH abuse, h/o MI, tardive dyskinesia f/b neurology (antipsychotic related) who is seen by nephrology for evaluation and management of CKD.   Pt was recently admtited to [facility] 10/16 -17 and given Cr 10 on background of progressive CKD was recommended he start dialysis.  He signed out AMA but returned yesterday after seeing PCP who checked labs, found Cr 10 and recommended he return to start dialysis.  He was at [facility] yesterday, had TDC placed in IR then transferred over the [facility].   He is alone in the room this AM.  Agreeable to starting dialysis.  After hearing about logistics of setting up outpt dialysis he becomes agitated and indicates he may leave the hospital. He declines consultation for permanent access.   PMH: Past Medical History:  Diagnosis Date   Alcohol abuse    Asthma    Back pain    Depression    Knee joint pain, left    Neck pain    Psychiatric problem    unspecified by patient (receives "injections" bi-weekly)   Tobacco abuse    PSH: Past Surgical History:  Procedure Laterality Date   arm surgery     IR PERC TUN PERIT CATH WO PORT S&I /IMAG  03/31/2022   IR US GUIDE VASC ACCESS RIGHT  03/31/2022     Past Medical History:  Diagnosis Date   Alcohol abuse    Asthma    Back pain    Depression    Knee joint pain, left    Neck pain    Psychiatric problem    unspecified by patient (receives "injections" bi-weekly)   Tobacco abuse     Medications:  I have reviewed the patient's current medications.  Medications Prior to Admission  Medication Sig Dispense Refill   acetaminophen (TYLENOL) 500 MG tablet Take 500 mg by mouth every 6 (six) hours as needed for moderate pain.     albuterol (VENTOLIN HFA) 108 (90 Base) MCG/ACT inhaler Inhale 2 puffs into the lungs every 4 (four)  hours as needed for wheezing or shortness of breath. 18 g 0   amLODipine (NORVASC) 5 MG tablet Take 1 tablet (5 mg total) by mouth daily. 30 tablet 2   paliperidone Palmitate ER (INVEGA TRINZA) 819 MG/2.63ML injection Inject 819 mg into the muscle every 3 (three) months.      ALLERGIES:   Allergies  Allergen Reactions   Aspirin Swelling   Ibuprofen Rash    FAM HX: Family History  Problem Relation Age of Onset   Diabetes Mother    Diabetes Father    CVA Father    Dementia Maternal Grandmother    Kidney disease Maternal Grandmother    Heart attack Neg Hx     Social History:   reports that he has been smoking cigarettes. He has been smoking an average of 1 pack per day. He has never used smokeless tobacco. He reports that he does not currently use alcohol. He reports current drug use. Drugs: Cocaine and Marijuana.  ROS: 12 system ROS neg except per HPI above  Blood pressure (!) 149/117, pulse 77, temperature 98.7 F (37.1 C), resp. rate 16, height 5\' 5"  (1.651 m), SpO2 100 %. PHYSICAL EXAM: Gen: overweight man who is agitated  Eyes: anicteric ENT:MMM Neck: RIJ TDC c/d/i CV:  RRR Lungs: normal WOB on  RA Extr: no edema Neuro: RUE tremor from dyskinesia, L arm unaffected    Results for orders placed or performed during the hospital encounter of 03/30/22 (from the past 48 hour(s))  Urinalysis, Routine w reflex microscopic Urine, Clean Catch     Status: Abnormal   Collection Time: 03/30/22  3:40 PM  Result Value Ref Range   Color, Urine COLORLESS (A) YELLOW   APPearance CLEAR CLEAR   Specific Gravity, Urine 1.003 (L) 1.005 - 1.030   pH 6.0 5.0 - 8.0   Glucose, UA NEGATIVE NEGATIVE mg/dL   Hgb urine dipstick SMALL (A) NEGATIVE   Bilirubin Urine NEGATIVE NEGATIVE   Ketones, ur NEGATIVE NEGATIVE mg/dL   Protein, ur 100 (A) NEGATIVE mg/dL   Nitrite NEGATIVE NEGATIVE   Leukocytes,Ua NEGATIVE NEGATIVE   RBC / HPF 0-5 0 - 5 RBC/hpf   Bacteria, UA NONE SEEN NONE SEEN    Sperm, UA PRESENT     Comment: Performed at [facility], [place] [address], [place], [place] [address]  CBC     Status: Abnormal   Collection Time: 03/30/22  3:48 PM  Result Value Ref Range   WBC 8.9 4.0 - 10.5 K/uL   RBC 3.44 (L) 4.22 - 5.81 MIL/uL   Hemoglobin 10.3 (L) 13.0 - 17.0 g/dL   HCT 31.1 (L) 39.0 - 52.0 %   MCV 90.4 80.0 - 100.0 fL   MCH 29.9 26.0 - 34.0 pg   MCHC 33.1 30.0 - 36.0 g/dL   RDW 13.7 11.5 - 15.5 %   Platelets 268 150 - 400 K/uL   nRBC 0.0 0.0 - 0.2 %    Comment: Performed at [facility], [place] [address], [place], [place] [address]  Comprehensive metabolic panel     Status: Abnormal   Collection Time: 03/30/22  3:48 PM  Result Value Ref Range   Sodium 141 135 - 145 mmol/L   Potassium 3.6 3.5 - 5.1 mmol/L   Chloride 112 (H) 98 - 111 mmol/L   CO2 18 (L) 22 - 32 mmol/L   Glucose, Bld 122 (H) 70 - 99 mg/dL    Comment: Glucose reference range applies only to samples taken after fasting for at least 8 hours.   BUN 45 (H) 6 - 20 mg/dL   Creatinine, Ser 10.87 (H) 0.61 - 1.24 mg/dL   Calcium 9.7 8.9 - 10.3 mg/dL   Total Protein 7.9 6.5 - 8.1 g/dL   Albumin 4.2 3.5 - 5.0 g/dL   AST 21 15 - 41 U/L   ALT 15 0 - 44 U/L   Alkaline Phosphatase 83 38 - 126 U/L   Total Bilirubin 0.7 0.3 - 1.2 mg/dL   GFR, Estimated 6 (L) >60 mL/min    Comment: (NOTE) Calculated using the CKD-EPI Creatinine Equation (2021)    Anion gap 11 5 - 15    Comment: Performed at [facility], [place] [address], [place], [place] [address]  Magnesium     Status: None   Collection Time: 03/30/22  3:48 PM  Result Value Ref Range   Magnesium 2.2 1.7 - 2.4 mg/dL    Comment: Performed at [facility], [place] [address], [place], [place] [address]  Reticulocytes     Status: Abnormal   Collection Time: 03/30/22  3:48 PM  Result Value Ref Range   Retic Ct Pct 1.9 0.4 - 3.1 %   RBC. 3.41 (L) 4.22 - 5.81 MIL/uL   Retic Count,  Absolute  64.1 19.0 - 186.0 K/uL   Immature Retic Fract 9.5 2.3 - 15.9 %    Comment: Performed at [facility], [place] [address] [place], [place] [address]  Rapid urine drug screen (hospital performed)     Status: None   Collection Time: 03/30/22  7:57 PM  Result Value Ref Range   Opiates NONE DETECTED NONE DETECTED   Cocaine NONE DETECTED NONE DETECTED   Benzodiazepines NONE DETECTED NONE DETECTED   Amphetamines NONE DETECTED NONE DETECTED   Tetrahydrocannabinol NONE DETECTED NONE DETECTED   Barbiturates NONE DETECTED NONE DETECTED    Comment: (NOTE) DRUG SCREEN FOR MEDICAL PURPOSES ONLY.  IF CONFIRMATION IS NEEDED FOR ANY PURPOSE, NOTIFY LAB WITHIN 5 DAYS.  LOWEST DETECTABLE LIMITS FOR URINE DRUG SCREEN Drug Class                     Cutoff (ng/mL) Amphetamine and metabolites    1000 Barbiturate and metabolites    200 Benzodiazepine                 200 Opiates and metabolites        300 Cocaine and metabolites        300 THC                            50 Performed at [facility], [place] [address], [place], [place] [address]   Vitamin B12     Status: None   Collection Time: 03/30/22  9:44 PM  Result Value Ref Range   Vitamin B-12 548 180 - 914 pg/mL    Comment: (NOTE) This assay is not validated for testing neonatal or myeloproliferative syndrome specimens for Vitamin B12 levels. Performed at [facility], [place] [address] [place], [place] [address]   Folate     Status: Abnormal   Collection Time: 03/30/22  9:44 PM  Result Value Ref Range   Folate 5.6 (L) >5.9 ng/mL    Comment: Performed at [facility], [place] [address], [place], [place] [address]  Iron and TIBC     Status: None   Collection Time: 03/30/22  9:44 PM  Result Value Ref Range   Iron 49 45 - 182 ug/dL   TIBC 265 250 - 450 ug/dL   Saturation Ratios 19 17.9 - 39.5 %   UIBC 216 ug/dL    Comment: Performed at [facility], [place] [address], [place], [place] [address]  Ferritin     Status: None   Collection Time: 03/30/22  9:44 PM  Result Value Ref Range   Ferritin 134 24 - 336 ng/mL    Comment: Performed at [facility], [place] [address] [place], [place] [address]  Comprehensive metabolic panel     Status: Abnormal   Collection Time: 03/31/22  4:20 AM  Result Value Ref Range   Sodium 139 135 - 145 mmol/L   Potassium 3.9 3.5 - 5.1 mmol/L   Chloride 113 (H) 98 - 111 mmol/L   CO2 17 (L) 22 - 32 mmol/L   Glucose, Bld 95 70 - 99 mg/dL    Comment: Glucose reference range applies only to samples taken after fasting for at least 8 hours.   BUN 52 (H) 6 - 20 mg/dL   Creatinine, Ser 10.59 (H) 0.61 - 1.24 mg/dL   Calcium 9.5 8.9 - 10.3 mg/dL   Total Protein 7.5 6.5 -  8.1 g/dL   Albumin 3.8 3.5 - 5.0 g/dL   AST 16 15 - 41 U/L   ALT 14 0 - 44 U/L   Alkaline Phosphatase 79 38 - 126 U/L   Total Bilirubin 0.6 0.3 - 1.2 mg/dL   GFR, Estimated 6 (L) >60 mL/min    Comment: (NOTE) Calculated using the CKD-EPI Creatinine Equation (2021)    Anion gap 9 5 - 15    Comment: Performed at [facility], [place] [address] [place], [place] [address]  CBC     Status: Abnormal   Collection Time: 03/31/22  4:20 AM  Result Value Ref Range   WBC 7.3 4.0 - 10.5 K/uL   RBC 3.44 (L) 4.22 - 5.81 MIL/uL   Hemoglobin 10.3 (L) 13.0 - 17.0 g/dL   HCT 31.3 (L) 39.0 - 52.0 %   MCV 91.0 80.0 - 100.0 fL   MCH 29.9 26.0 - 34.0 pg   MCHC 32.9 30.0 - 36.0 g/dL   RDW 13.6 11.5 - 15.5 %   Platelets 248 150 - 400 K/uL   nRBC 0.0 0.0 - 0.2 %    Comment: Performed at [facility], [place] [address] [place], [place] [address]  CBC     Status: Abnormal   Collection Time: 04/01/22  2:20 AM  Result Value Ref Range   WBC 9.0 4.0 - 10.5 K/uL   RBC 3.33 (L) 4.22 - 5.81 MIL/uL   Hemoglobin 10.0 (L) 13.0 - 17.0 g/dL   HCT 30.7 (L) 39.0 - 52.0 %   MCV 92.2 80.0 - 100.0 fL   MCH 30.0 26.0  - 34.0 pg   MCHC 32.6 30.0 - 36.0 g/dL   RDW 13.7 11.5 - 15.5 %   Platelets 262 150 - 400 K/uL   nRBC 0.0 0.0 - 0.2 %    Comment: Performed at [facility], [place] [address] [place], [place] [address]  Renal function panel     Status: Abnormal   Collection Time: 04/01/22  2:20 AM  Result Value Ref Range   Sodium 136 135 - 145 mmol/L   Potassium 3.4 (L) 3.5 - 5.1 mmol/L   Chloride 110 98 - 111 mmol/L   CO2 18 (L) 22 - 32 mmol/L   Glucose, Bld 116 (H) 70 - 99 mg/dL    Comment: Glucose reference range applies only to samples taken after fasting for at least 8 hours.   BUN 52 (H) 6 - 20 mg/dL   Creatinine, Ser 11.27 (H) 0.61 - 1.24 mg/dL   Calcium 9.2 8.9 - 10.3 mg/dL   Phosphorus 5.8 (H) 2.5 - 4.6 mg/dL   Albumin 3.6 3.5 - 5.0 g/dL   GFR, Estimated 5 (L) >60 mL/min    Comment: (NOTE) Calculated using the CKD-EPI Creatinine Equation (2021)    Anion gap 8 5 - 15    Comment: Performed at [place] [address] [place],  [address]    IR TUNNELED CENTRAL VENOUS CATHETER PLACEMENT  Result Date: 03/31/2022 INDICATION: 36 year old with end-stage renal disease and needs a tunneled dialysis catheter. EXAM: FLUOROSCOPIC AND ULTRASOUND GUIDED PLACEMENT OF A TUNNELED DIALYSIS CATHETER Physician: Stephan Minister. Henn, MD MEDICATIONS: Ancef 2 g ANESTHESIA/SEDATION: Moderate (conscious) sedation was employed during this procedure. A total of Versed 2mg  and fentanyl 100 mcg was administered intravenously at the order of the provider performing the procedure. Total intra-service moderate sedation time: 18 minutes. Patient's level of consciousness and vital signs were monitored  continuously by radiology nurse throughout the procedure under the supervision of the provider performing the procedure. FLUOROSCOPY TIME:  Radiation Exposure Index (as provided by the fluoroscopic device): 5 mGy Kerma COMPLICATIONS: None immediate. PROCEDURE: Informed consent was obtained for placement of a tunneled  dialysis catheter. The patient was placed supine on the interventional table. Ultrasound confirmed a patent right internal jugular vein. Ultrasound image obtained for documentation. The right neck and chest was prepped and draped in a sterile fashion. Maximal barrier sterile technique was utilized including caps, mask, sterile gowns, sterile gloves, sterile drape, hand hygiene and skin antiseptic. The right neck was anesthetized with 1% lidocaine. A small incision was made with #11 blade scalpel. A 21 gauge needle directed into the right internal jugular vein with ultrasound guidance. A micropuncture dilator set was placed. A 19 cm tip to cuff Palindrome catheter was selected. The skin below the right clavicle was anesthetized and a small incision was made with an #11 blade scalpel. A subcutaneous tunnel was formed to the vein dermatotomy site. The catheter was brought through the tunnel. The vein dermatotomy site was dilated to accommodate a peel-away sheath. The catheter was placed through the peel-away sheath and directed into the central venous structures. The tip of the catheter was placed at superior cavoatrial junction with fluoroscopy. Fluoroscopic images were obtained for documentation. Both lumens were found to aspirate and flush well. The proper amount of heparin was flushed in both lumens. The vein dermatotomy site was closed using a single layer of absorbable suture and Dermabond. Gel-Foam placed in subcutaneous tract. The catheter was secured to the skin using Prolene suture. IMPRESSION: Successful placement of a right jugular tunneled dialysis catheter using ultrasound and fluoroscopic guidance. Electronically Signed   By: Markus Daft M.D.   On: 03/31/2022 18:37   IR US Guide Vasc Access Right  Result Date: 03/31/2022 INDICATION: 36 year old with end-stage renal disease and needs a tunneled dialysis catheter. EXAM: FLUOROSCOPIC AND ULTRASOUND GUIDED PLACEMENT OF A TUNNELED DIALYSIS CATHETER  Physician: Stephan Minister. Henn, MD MEDICATIONS: Ancef 2 g ANESTHESIA/SEDATION: Moderate (conscious) sedation was employed during this procedure. A total of Versed 2mg  and fentanyl 100 mcg was administered intravenously at the order of the provider performing the procedure. Total intra-service moderate sedation time: 18 minutes. Patient's level of consciousness and vital signs were monitored continuously by radiology nurse throughout the procedure under the supervision of the provider performing the procedure. FLUOROSCOPY TIME:  Radiation Exposure Index (as provided by the fluoroscopic device): 5 mGy Kerma COMPLICATIONS: None immediate. PROCEDURE: Informed consent was obtained for placement of a tunneled dialysis catheter. The patient was placed supine on the interventional table. Ultrasound confirmed a patent right internal jugular vein. Ultrasound image obtained for documentation. The right neck and chest was prepped and draped in a sterile fashion. Maximal barrier sterile technique was utilized including caps, mask, sterile gowns, sterile gloves, sterile drape, hand hygiene and skin antiseptic. The right neck was anesthetized with 1% lidocaine. A small incision was made with #11 blade scalpel. A 21 gauge needle directed into the right internal jugular vein with ultrasound guidance. A micropuncture dilator set was placed. A 19 cm tip to cuff Palindrome catheter was selected. The skin below the right clavicle was anesthetized and a small incision was made with an #11 blade scalpel. A subcutaneous tunnel was formed to the vein dermatotomy site. The catheter was brought through the tunnel. The vein dermatotomy site was dilated to accommodate a peel-away sheath. The catheter was placed through the  peel-away sheath and directed into the central venous structures. The tip of the catheter was placed at superior cavoatrial junction with fluoroscopy. Fluoroscopic images were obtained for documentation. Both lumens were found to  aspirate and flush well. The proper amount of heparin was flushed in both lumens. The vein dermatotomy site was closed using a single layer of absorbable suture and Dermabond. Gel-Foam placed in subcutaneous tract. The catheter was secured to the skin using Prolene suture. IMPRESSION: Successful placement of a right jugular tunneled dialysis catheter using ultrasound and fluoroscopic guidance. Electronically Signed   By: Markus Daft M.D.   On: 03/31/2022 18:37   DG CHEST PORT 1 VIEW  Result Date: 03/30/2022 CLINICAL DATA:  Acute renal insufficiency. EXAM: PORTABLE CHEST 1 VIEW COMPARISON:  Chest radiograph dated 03/20/2022. FINDINGS: The heart size and mediastinal contours are within normal limits. Both lungs are clear. The visualized skeletal structures are unremarkable. IMPRESSION: No active disease. Electronically Signed   By: Anner Crete M.D.   On: 03/30/2022 20:14    Assessment/Plan **ESRD:  progressive CKD from HTN/NSAIDs now with GFR 5, starting dialysis.  S/p [facility] 10/27 in IR.  HD #1 today - 2hr tx, low flows; plan would be #2 Mon.  Discussed perm access - he declines for now, will continue to educate.  CLIP.  Encouraged him to remain hospitalized to allow outpt HD arrangements to be made. Renal diet.    **HTN: resuming home meds  **Hypokalemia: k 3.4 - use 4K dialysate.   **Anemia:  Hb 10, iron sat 19%, give IV iron load.  No ESA for now  **Secondary hyperPTH: PTH 477, Phos 5.8.  renal diet.  Calcitriol TIW.  **Tardive dyskinesia:  couldn't use RUE for AV access, L appears unaffected.   Will follow.  Hope he doesn't sign out AMA - advised risk to health and encouraged him to stay.   Justin Mend 04/01/2022, 11:18 AM

## 2022-04-02 DIAGNOSIS — N185 Chronic kidney disease, stage 5: Secondary | ICD-10-CM | POA: Diagnosis not present

## 2022-04-02 DIAGNOSIS — N179 Acute kidney failure, unspecified: Secondary | ICD-10-CM | POA: Diagnosis not present

## 2022-04-02 LAB — RENAL FUNCTION PANEL
Albumin: 3.7 g/dL (ref 3.5–5.0)
Anion gap: 11 (ref 5–15)
BUN: 42 mg/dL — ABNORMAL HIGH (ref 6–20)
CO2: 22 mmol/L (ref 22–32)
Calcium: 9.9 mg/dL (ref 8.9–10.3)
Chloride: 107 mmol/L (ref 98–111)
Creatinine, Ser: 9.27 mg/dL — ABNORMAL HIGH (ref 0.61–1.24)
GFR, Estimated: 7 mL/min — ABNORMAL LOW (ref >60)
Glucose, Bld: 100 mg/dL — ABNORMAL HIGH (ref 70–99)
Phosphorus: 6 mg/dL — ABNORMAL HIGH (ref 2.5–4.6)
Potassium: 4 mmol/L (ref 3.5–5.1)
Sodium: 140 mmol/L (ref 135–145)

## 2022-04-02 LAB — CBC
HCT: 31.7 % — ABNORMAL LOW (ref 39.0–52.0)
Hemoglobin: 10.9 g/dL — ABNORMAL LOW (ref 13.0–17.0)
MCH: 30.3 pg (ref 26.0–34.0)
MCHC: 34.4 g/dL (ref 30.0–36.0)
MCV: 88.1 fL (ref 80.0–100.0)
Platelets: 278 10*3/uL (ref 150–400)
RBC: 3.6 MIL/uL — ABNORMAL LOW (ref 4.22–5.81)
RDW: 13.6 % (ref 11.5–15.5)
WBC: 7.9 10*3/uL (ref 4.0–10.5)
nRBC: 0 % (ref 0.0–0.2)

## 2022-04-02 LAB — HEPATITIS B SURFACE ANTIBODY, QUANTITATIVE: Hep B S AB Quant (Post): <3.1 m[IU]/mL — ABNORMAL LOW (ref >9.9)

## 2022-04-02 LAB — MAGNESIUM: Magnesium: 2.2 mg/dL (ref 1.7–2.4)

## 2022-04-02 MED ORDER — NEPRO/CARBSTEADY PO LIQD
237.0000 mL | ORAL | Status: DC
  Administered 2022-04-02: 237 mL via ORAL

## 2022-04-02 MED ORDER — PROSOURCE PLUS PO LIQD
30.0000 mL | Freq: Two times a day (BID) | ORAL | Status: DC
  Filled 2022-04-02 (×3): qty 30

## 2022-04-02 MED ORDER — SODIUM CHLORIDE 0.9 % IV SOLN
125.0000 mg | INTRAVENOUS | Status: DC
  Administered 2022-04-03: 125 mg via INTRAVENOUS
  Filled 2022-04-02: qty 10

## 2022-04-02 NOTE — Progress Notes (Signed)
[facility] Progress Note   Subjective:   Had 1st HD yesterday - tolerated it well. He seems calmer today.   Objective Vitals:   04/01/22 1829 04/01/22 1830 04/02/22 0700 04/02/22 1037  BP: (!) 171/119 (!) 171/119 (!) 140/95 123/73  Pulse: 94   76  Resp: 16  18 18   Temp: 98.7 F (37.1 C)  98.9 F (37.2 C) 98.6 F (37 C)  TempSrc:   Oral Oral  SpO2: 100%  100% 98%  Weight:      Height:       Physical Exam Gen: overweight man who is calm Eyes: anicteric ENT:MMM Neck: RIJ TDC c/d/i CV:  RRR Lungs: normal WOB on RA Extr: no edema Neuro: RUE tremor from dyskinesia, L arm unaffected  Additional Objective Labs: Basic Metabolic Panel: Recent Labs  Lab 03/31/22 0420 04/01/22 0220 04/02/22 0349  NA 139 136 140  K 3.9 3.4* 4.0  CL 113* 110 107  CO2 17* 18* 22  GLUCOSE 95 116* 100*  BUN 52* 52* 42*  CREATININE 10.59* 11.27* 9.27*  CALCIUM 9.5 9.2 9.9  PHOS  --  5.8* 6.0*   Liver Function Tests: Recent Labs  Lab 03/30/22 1548 03/31/22 0420 04/01/22 0220 04/02/22 0349  AST 21 16  --   --   ALT 15 14  --   --   ALKPHOS 83 79  --   --   BILITOT 0.7 0.6  --   --   PROT 7.9 7.5  --   --   ALBUMIN 4.2 3.8 3.6 3.7   No results for input(s): "LIPASE", "AMYLASE" in the last 168 hours. CBC: Recent Labs  Lab 03/29/22 1051 03/30/22 1548 03/30/22 1548 03/31/22 0420 04/01/22 0220 04/02/22 0349  WBC 7.2 8.9   < > 7.3 9.0 7.9  HGB 10.4* 10.3*   < > 10.3* 10.0* 10.9*  HCT 31.1* 31.1*   < > 31.3* 30.7* 31.7*  MCV 88 90.4  --  91.0 92.2 88.1  PLT 273 268   < > 248 262 278   < > = values in this interval not displayed.   Blood Culture    Component Value Date/Time   SDES THROAT 12/15/2019 1121   SPECREQUEST NONE 12/15/2019 1121   CULT  12/15/2019 1121    NO GROUP A STREP (S.PYOGENES) ISOLATED Performed at [facility], [place] [address], [place], [place] [address]    REPTSTATUS 12/17/2019 FINAL 12/15/2019 1121    Cardiac Enzymes: Recent  Labs  Lab 03/29/22 1051  CKTOTAL 1,207*   CBG: No results for input(s): "GLUCAP" in the last 168 hours. Iron Studies:  Recent Labs    03/30/22 2144  IRON 49  TIBC 265  FERRITIN 134   @[email or web address] @ Studies/Results: IR TUNNELED CENTRAL VENOUS CATHETER PLACEMENT  Result Date: 03/31/2022 INDICATION: 36 year old with end-stage renal disease and needs a tunneled dialysis catheter. EXAM: FLUOROSCOPIC AND ULTRASOUND GUIDED PLACEMENT OF A TUNNELED DIALYSIS CATHETER Physician: Stephan Minister. Henn, MD MEDICATIONS: Ancef 2 g ANESTHESIA/SEDATION: Moderate (conscious) sedation was employed during this procedure. A total of Versed 2mg  and fentanyl 100 mcg was administered intravenously at the order of the provider performing the procedure. Total intra-service moderate sedation time: 18 minutes. Patient's level of consciousness and vital signs were monitored continuously by radiology nurse throughout the procedure under the supervision of the provider performing the procedure. FLUOROSCOPY TIME:  Radiation Exposure Index (as provided by the fluoroscopic device): 5 mGy Kerma COMPLICATIONS: None immediate. PROCEDURE: Informed consent was obtained  for placement of a tunneled dialysis catheter. The patient was placed supine on the interventional table. Ultrasound confirmed a patent right internal jugular vein. Ultrasound image obtained for documentation. The right neck and chest was prepped and draped in a sterile fashion. Maximal barrier sterile technique was utilized including caps, mask, sterile gowns, sterile gloves, sterile drape, hand hygiene and skin antiseptic. The right neck was anesthetized with 1% lidocaine. A small incision was made with #11 blade scalpel. A 21 gauge needle directed into the right internal jugular vein with ultrasound guidance. A micropuncture dilator set was placed. A 19 cm tip to cuff Palindrome catheter was selected. The skin below the right clavicle was anesthetized and a small incision was  made with an #11 blade scalpel. A subcutaneous tunnel was formed to the vein dermatotomy site. The catheter was brought through the tunnel. The vein dermatotomy site was dilated to accommodate a peel-away sheath. The catheter was placed through the peel-away sheath and directed into the central venous structures. The tip of the catheter was placed at superior cavoatrial junction with fluoroscopy. Fluoroscopic images were obtained for documentation. Both lumens were found to aspirate and flush well. The proper amount of heparin was flushed in both lumens. The vein dermatotomy site was closed using a single layer of absorbable suture and Dermabond. Gel-Foam placed in subcutaneous tract. The catheter was secured to the skin using Prolene suture. IMPRESSION: Successful placement of a right jugular tunneled dialysis catheter using ultrasound and fluoroscopic guidance. Electronically Signed   By: Markus Daft M.D.   On: 03/31/2022 18:37   IR US Guide Vasc Access Right  Result Date: 03/31/2022 INDICATION: 37 year old with end-stage renal disease and needs a tunneled dialysis catheter. EXAM: FLUOROSCOPIC AND ULTRASOUND GUIDED PLACEMENT OF A TUNNELED DIALYSIS CATHETER Physician: Stephan Minister. Henn, MD MEDICATIONS: Ancef 2 g ANESTHESIA/SEDATION: Moderate (conscious) sedation was employed during this procedure. A total of Versed 2mg  and fentanyl 100 mcg was administered intravenously at the order of the provider performing the procedure. Total intra-service moderate sedation time: 18 minutes. Patient's level of consciousness and vital signs were monitored continuously by radiology nurse throughout the procedure under the supervision of the provider performing the procedure. FLUOROSCOPY TIME:  Radiation Exposure Index (as provided by the fluoroscopic device): 5 mGy Kerma COMPLICATIONS: None immediate. PROCEDURE: Informed consent was obtained for placement of a tunneled dialysis catheter. The patient was placed supine on the  interventional table. Ultrasound confirmed a patent right internal jugular vein. Ultrasound image obtained for documentation. The right neck and chest was prepped and draped in a sterile fashion. Maximal barrier sterile technique was utilized including caps, mask, sterile gowns, sterile gloves, sterile drape, hand hygiene and skin antiseptic. The right neck was anesthetized with 1% lidocaine. A small incision was made with #11 blade scalpel. A 21 gauge needle directed into the right internal jugular vein with ultrasound guidance. A micropuncture dilator set was placed. A 19 cm tip to cuff Palindrome catheter was selected. The skin below the right clavicle was anesthetized and a small incision was made with an #11 blade scalpel. A subcutaneous tunnel was formed to the vein dermatotomy site. The catheter was brought through the tunnel. The vein dermatotomy site was dilated to accommodate a peel-away sheath. The catheter was placed through the peel-away sheath and directed into the central venous structures. The tip of the catheter was placed at superior cavoatrial junction with fluoroscopy. Fluoroscopic images were obtained for documentation. Both lumens were found to aspirate and flush well. The proper  amount of heparin was flushed in both lumens. The vein dermatotomy site was closed using a single layer of absorbable suture and Dermabond. Gel-Foam placed in subcutaneous tract. The catheter was secured to the skin using Prolene suture. IMPRESSION: Successful placement of a right jugular tunneled dialysis catheter using ultrasound and fluoroscopic guidance. Electronically Signed   By: Markus Daft M.D.   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  S/p [facility] 10/27 in IR.  HD #1 10/28; plan  #2 Mon.  Discussed perm access - he declines for now, will continue to educate but he seems to be warming up. Consult VVS this week if agreeable.  Will need  CLIP.  Encouraged him to remain hospitalized to allow outpt HD arrangements to be made. Renal diet.     **HTN: resumed home meds and BP fine.    **Anemia:  Hb 10, iron sat 19%, giving IV iron load.  No ESA for now   **Secondary hyperPTH: PTH 477, Phos 5.8.  renal diet.  Calcitriol TIW.   **Tardive dyskinesia:  couldn't use RUE for AV access, L appears unaffected.    Will follow.  Hope he doesn't sign out AMA - advised risk to health and encouraged him to stay.   Jannifer Hick MD 04/02/2022, 1:39 PM  [facility] Pager: [phone]

## 2022-04-02 NOTE — Progress Notes (Signed)
Initial Nutrition Assessment RD working remotely.   DOCUMENTATION CODES:   Obesity unspecified  INTERVENTION:  - ordered Nepro Shake po once/day, each supplement provides 425 kcal and 19 grams protein.  - ordered 30 ml Prosource Plus BID, each supplement provides 100 kcal and 15 grams protein.   - entered Phosphorus Content of Foods handout from the Academy of Nutrition and Dietetics into the AVS.  - complete NFPE when feasible and ask patient about UBW/EDW.  - provide full Renal diet education when feasible.   NUTRITION DIAGNOSIS:   Increased nutrient needs related to chronic illness (CKD progressed to ESRD with need for dialysis) as evidenced by estimated needs.  GOAL:   Patient will meet greater than or equal to 90% of their needs  MONITOR:   PO intake, Supplement acceptance, Labs, Weight trends, I & O's  REASON FOR ASSESSMENT:   Malnutrition Screening Tool, Consult Assessment of nutrition requirement/status  ASSESSMENT:   36 year old with male with medical history of HTN, polysubstance abuse, anemia of chronic disease, schizophrenia, tardive dyskinesia, CKD stage V now ESRD, morbid obesity, and tobacco use. He presented to the ED at [facility] on 10/26 from his PCP's office due to abnormal labs. He left AMA but returned. Patient was transferred from [facility] to [facility] due to need for dialysis initiation.  No phosphate binders noted in current med orders. Unable to reach patient by phone. He is currently ordered a Renal diet with 1.2 L/day fluid restriction; no meal completion percentages documented in the flow sheet.  He has not been assessed by a [facility] RD at any time in the past.  Patient had first iHD treatment x2 hours on 04/01/22. Reviewed Nephrologist's note from 10/28 which indicates that patient became upset and threatened to leave AMA when permanent dialysis access and setting up outpatient dialysis was discussed.   Weight yesterday was  documented as both 247 lb and 249 lb which are both up from 253 lb on 03/20/22. Prior to that, no weight recordings available since 05/22/18, including in [place]. No information documented in the edema section of flow sheet.    Labs reviewed; K and Mg WDL, BUN: 42 mg/dl (trending down), creatinine: 9.27 mg/dl (trending down), Phos: 6 mg/dl, GFR: 7 ml/min.   Medications reviewed; 100 mg colace BID, 1 mg folvite/day.    NUTRITION - FOCUSED PHYSICAL EXAM:  RD working remotely.  Diet Order:   Diet Order             Diet renal with fluid restriction Fluid restriction: 1200 mL Fluid; Room service appropriate? Yes; Fluid consistency: Thin  Diet effective now                   EDUCATION NEEDS:   Not appropriate for education at this time  Skin:  Skin Assessment: Reviewed RN Assessment  Last BM:  PTA/unknown  Height:   Ht Readings from Last 1 Encounters:  04/01/22 5\' 5"  (1.651 m)    Weight:   Wt Readings from Last 1 Encounters:  04/01/22 113.1 kg     BMI:  Body mass index is 41.49 kg/m.  Estimated Nutritional Needs:  Kcal:  2245-2470 kcal (20-22 kcal/kg) Protein:  115-130 grams Fluid:  UOP + 1 L/day      Jarome Matin, MS, RD, LDN, CNSC Clinical Dietitian PRN/Relief staff On-call/weekend pager # available in [facility]

## 2022-04-02 NOTE — Plan of Care (Signed)
  Problem: Clinical Measurements: Goal: Respiratory complications will improve Outcome: Progressing Goal: Cardiovascular complication will be avoided Outcome: Progressing   Problem: Activity: Goal: Risk for activity intolerance will decrease Outcome: Progressing   Problem: Nutrition: Goal: Adequate nutrition will be maintained Outcome: Progressing   Problem: Elimination: Goal: Will not experience complications related to bowel motility Outcome: Progressing Goal: Will not experience complications related to urinary retention Outcome: Progressing   

## 2022-04-02 NOTE — Plan of Care (Signed)
  Problem: Health Behavior/Discharge Planning: Goal: Ability to manage health-related needs will improve Outcome: Not Progressing   Problem: Clinical Measurements: Goal: Ability to maintain clinical measurements within normal limits will improve Outcome: Not Progressing Goal: Will remain free from infection Outcome: Progressing Goal: Diagnostic test results will improve Outcome: Not Progressing Goal: Respiratory complications will improve Outcome: Progressing Goal: Cardiovascular complication will be avoided Outcome: Progressing

## 2022-04-02 NOTE — Discharge Instructions (Signed)
Phosphorus Content Of Foods  Phosphorus is an important mineral that your body uses for energy and overall health. What you eat and drink can affect the amount of phosphorus in your body. The key to selecting foods with phosphorus is having the right balance for your needs. Natural and Added Phosphorus Natural Phosphorus: Phosphorus occurs naturally in meats, dairy, grains, and vegetables. Your body absorbs about half of this natural phosphorus from foods and drinks. Added Phosphorus: Phosphorus is also added to many foods and drinks as a preservative. Your body absorbs nearly all of the added phosphorus from foods and drinks. How Much Phosphorus is in Food and Drinks? Nutrition Facts labels don't typically include phosphorus amounts and they don't identify whether the phosphorus in the product is natural or added. Read the ingredients list to check if a product label displays "phos" in the ingredients. This abbreviation will indicate for sure that phosphorus has been added. Ingredients with phosphorus that are most commonly added to food include disodium phosphate, sodium hexametaphosphate, phosphoric acid, calcium phosphate, and dipotassium phosphate. Foods and drinks with the highest added phosphorus are usually processed foods, packaged foods, and fast foods.  Phosphorus in Foods   Lower Phosphorus Choices   Higher Phosphorus Choices    Grains and Baked Goods Fresh breads, buns, dinner rolls, bagels English muffins, or pitas without "phos" ingredients Plain cereals such as oatmeal, corn flakes, rice crispies Reduced-salt crackers, rice cakes, pretzels, popcorn, or tortilla chips without "phos" ingredients Processed breads and cereals with phosphorus additives on food label Biscuits, brownies, cakes, muffins, pancakes, pastries, or waffles that are ready-to-eat or made from a dry mix with "phos" on the label Refrigerated or frozen dough for biscuits, cookies, pastries, or sweet rolls with  "phos" ingredients  Protein Foods All-natural chicken, [place], fish or seafood Lean and fresh beef, lamb, pork, veal, or wild game (3 ounces is size of palm of hand) Whole eggs or egg whites (1 egg is 1 ounce) Tofu, beans, lentils, hummus (1/4-1/3 cup) Unsalted nuts (1/4 cup) or nut butters (1 tablespoon) Processed meats like bacon, ham, hot dogs, chicken nuggets or strips, bologna, salami, or sausage Breaded or fried meats, chicken, fish or seafood Organ meats such as kidney or liver  Dairy and Dairy Alternatives Unfortified dairy alternates such as almond or rice beverages Milk or soy beverage (1/2 cup) Cottage cheese with no "phos" ingredients (1/2 cup) Yogurt (6 ounces) all natural, unsweetened, or plain preferred Natural cheese such as brie, feta, Swiss, cheddar, or mozzarella.  Only have a small amount (1 ounce - size of your thumb or 2 dice) Regular or low-fat cream cheese, [place], or sour cream (1 tbsp) Sherbet, sorbet, fruit ice, or popsicles (1/2 cup) Non-dairy creamers and some half and half creamers with "phos"  Enriched dairy alternates such as almond, oat or rice beverages Processed cheese, such as American Processed cheese spreads and dips Fat free cream cheese or sour cream Ice cream, pudding or frozen yogurt Milk-based or cheese-based soups or sauces  Vegetables Fresh, frozen, or canned without added "phos" ingredients Vegetables with sauces added Frozen or packaged potatoes or vegetables with "phos" ingredients  Fruits Fresh, frozen or canned without added "phos" ingredients  Fresh, frozen or canned with added "phos" ingredients  Beverages Water Fresh brewed coffee or tea Fresh lemonade or pure fruit juice (1/2 cup) Beverages without "phos" ingredients Beverage or powdered mix with "phos" ingredients: most colas, energy and sports drinks, canned or bottled coffees and teas, flavored waters and drink mixes.  Beers and wines  Other (Fast, convenience or restaurant  foods) Hamburgers, fish filet (no cheese), plain chicken wings Grilled, roasted, broiled, baked fish, chicken, [place], fish or seafood. Plain prepared eggs (no cheese, ham, bacon, sausage), [place] toast, English muffin, bagel, hot cereal, toast Pizza without meat or extra cheese Tacos, burritos, enchiladas fajitas with limited toppings. Choose white rice, lettuce, sauted onions, bell peppers on the side Tuna or egg salad sandwich (no cheese) Sides: salad without cheese, coleslaw, apple slices, applesauce, grapes, or carrots Meals with no "phos" ingredients and have less than 600 milligrams of sodium per serving Battered or fried fish or chicken including nuggets, sandwiches, strips or wings Pizza with meat and extra cheese Tacos, burritos, enchiladas with meat and toppings such as cheese and beans Hot dogs and sausages Any sandwich made with ham, processed deli meats, American cheese, or bacon [place] fries or other fried potatoes or battered vegetables, biscuits, or macaroni and cheese Meals or soups with "phos" ingredients and more than 600 millligrams of sodium per serving    Copyright 2020  Academy of Nutrition and Dietetics. All rights reserved

## 2022-04-02 NOTE — Progress Notes (Signed)
Patient very irritable upon assessment. Patient covered his head with blanket as nurse was speaking to him and had to repeat questions several time before the patient would answer. Patient was only agreeable to allow the a focused assessment and the visualization of the HD catheter.

## 2022-04-02 NOTE — Progress Notes (Signed)
PROGRESS NOTE    DELSHAWN STECH  IRS:[id number] DOB: 1985-08-23 DOA: 03/30/2022 PCP: Patient, No Pcp Per   Brief Narrative:  36 year old with history of HTN, polysubstance abuse, anemia of chronic disease, schizophrenia, tardive dyskinesia, CKD stage V now ESRD, morbid obesity, tobacco use came to the ED at [facility] long on 10/26 from PCPs office for abnormal lab.  Initially had left AGAINST MEDICAL ADVICE but returned back to the hospital and now agreeable for dialysis.  Transferred here from [place] long to initiate HD.   Assessment & Plan:  Principal Problem:   Renal failure Active Problems:   Alcohol abuse   Tobacco abuse   Anemia   Tardive dyskinesia   Essential hypertension    CKD stage V now ESRD -Creatinine is now up to 10.  Nephrology and IR consulted.  IR placed tunneled catheter to initiate HD.  Will defer to nephro for vascular consult for permanent access   Essential hypertension, uncontrolled -Norvasc 5 mg daily.  IV as needed ordered Hopefully BP will improve with HD, if not we will increase Norvasc   Anemia of chronic medical/renal disease anemia of chronic disease, hemoglobin 10.  Should get EPO/aranesp with HD, will defer to nephrology.  Folate deficiency - Supplements   Schizophrenia Tardive dyskinesia -Patient receives Invega injection every 3 months   History of polysubstance abuse/cocaine abuse Patient reports no cocaine use over the last 2-3 years.  Counseled on need for complete cessation.   Tobacco use disorder Continues to endorse tobacco abuse.  Counseled on need for complete cessation -- Nicotine patch   Morbid obesity BMI 41.20 kg/m.  Discussed with patient needs for aggressive lifestyle changes/weight loss as this complicates all facets of care.  Outpatient follow-up with PCP.        DVT prophylaxis: SCDs Start: 03/31/22 0093 Code Status: Full Code Family Communication: Mother at bedside  Status is: Inpatient Remains inpatient  appropriate because: Starting HD during this admission by Nephro.       Subjective: Seen and examined at bedside, no complaints.  Patient is anxious about being on dialysis but mother understands and has explained to him. Examination:  Constitutional: Not in acute distress Respiratory: Clear to auscultation bilaterally Cardiovascular: Normal sinus rhythm, no rubs Abdomen: Nontender nondistended good bowel sounds Musculoskeletal: No edema noted Skin: No rashes seen Neurologic: CN 2-12 grossly intact.  And nonfocal Psychiatric: Normal judgment and insight. Alert and oriented x 3. Normal mood. Right chest wall tunneled catheter in place   Objective: Vitals:   04/01/22 1733 04/01/22 1829 04/01/22 1830 04/02/22 0700  BP: (!) 147/107 (!) 171/119 (!) 171/119 (!) 140/95  Pulse: 74 94    Resp: 13 16  18   Temp: 98.7 F (37.1 C) 98.7 F (37.1 C)  98.9 F (37.2 C)  TempSrc: Oral   Oral  SpO2: 100% 100%  100%  Weight: 113.1 kg     Height:        Intake/Output Summary (Last 24 hours) at 04/02/2022 1037 Last data filed at 04/01/2022 1733 Gross per 24 hour  Intake 360 ml  Output -0.1 ml  Net 360.1 ml   Filed Weights   04/01/22 1458 04/01/22 1733  Weight: 112 kg 113.1 kg     Data Reviewed:   CBC: Recent Labs  Lab 03/29/22 1051 03/30/22 1548 03/31/22 0420 04/01/22 0220 04/02/22 0349  WBC 7.2 8.9 7.3 9.0 7.9  HGB 10.4* 10.3* 10.3* 10.0* 10.9*  HCT 31.1* 31.1* 31.3* 30.7* 31.7*  MCV 88 90.4 91.0  92.2 88.1  PLT 273 268 248 262 761   Basic Metabolic Panel: Recent Labs  Lab 03/29/22 1051 03/30/22 1548 03/31/22 0420 04/01/22 0220 04/02/22 0349  NA 144 141 139 136 140  K 4.0 3.6 3.9 3.4* 4.0  CL 109* 112* 113* 110 107  CO2 16* 18* 17* 18* 22  GLUCOSE 76 122* 95 116* 100*  BUN 45* 45* 52* 52* 42*  CREATININE 10.03* 10.87* 10.59* 11.27* 9.27*  CALCIUM 9.9 9.7 9.5 9.2 9.9  MG  --  2.2  --   --  2.2  PHOS  --   --   --  5.8* 6.0*   GFR: Estimated Creatinine  Clearance: 12.5 mL/min (A) (by C-G formula based on SCr of 9.27 mg/dL (H)). Liver Function Tests: Recent Labs  Lab 03/30/22 1548 03/31/22 0420 04/01/22 0220 04/02/22 0349  AST 21 16  --   --   ALT 15 14  --   --   ALKPHOS 83 79  --   --   BILITOT 0.7 0.6  --   --   PROT 7.9 7.5  --   --   ALBUMIN 4.2 3.8 3.6 3.7   No results for input(s): "LIPASE", "AMYLASE" in the last 168 hours. No results for input(s): "AMMONIA" in the last 168 hours. Coagulation Profile: No results for input(s): "INR", "PROTIME" in the last 168 hours. Cardiac Enzymes: Recent Labs  Lab 03/29/22 1051  CKTOTAL 1,207*   BNP (last 3 results) No results for input(s): "PROBNP" in the last 8760 hours. HbA1C: No results for input(s): "HGBA1C" in the last 72 hours.  CBG: No results for input(s): "GLUCAP" in the last 168 hours. Lipid Profile: No results for input(s): "CHOL", "HDL", "LDLCALC", "TRIG", "CHOLHDL", "LDLDIRECT" in the last 72 hours. Thyroid Function Tests: No results for input(s): "TSH", "T4TOTAL", "FREET4", "T3FREE", "THYROIDAB" in the last 72 hours. Anemia Panel: Recent Labs    03/30/22 1548 03/30/22 2144  VITAMINB12  --  548  FOLATE  --  5.6*  FERRITIN  --  134  TIBC  --  265  IRON  --  49  RETICCTPCT 1.9  --    Sepsis Labs: No results for input(s): "PROCALCITON", "LATICACIDVEN" in the last 168 hours.  No results found for this or any previous visit (from the past 240 hour(s)).       Radiology Studies: IR TUNNELED CENTRAL VENOUS CATHETER PLACEMENT  Result Date: 03/31/2022 INDICATION: 36 year old with end-stage renal disease and needs a tunneled dialysis catheter. EXAM: FLUOROSCOPIC AND ULTRASOUND GUIDED PLACEMENT OF A TUNNELED DIALYSIS CATHETER Physician: Stephan Minister. Henn, MD MEDICATIONS: Ancef 2 g ANESTHESIA/SEDATION: Moderate (conscious) sedation was employed during this procedure. A total of Versed 2mg  and fentanyl 100 mcg was administered intravenously at the order of the provider  performing the procedure. Total intra-service moderate sedation time: 18 minutes. Patient's level of consciousness and vital signs were monitored continuously by radiology nurse throughout the procedure under the supervision of the provider performing the procedure. FLUOROSCOPY TIME:  Radiation Exposure Index (as provided by the fluoroscopic device): 5 mGy Kerma COMPLICATIONS: None immediate. PROCEDURE: Informed consent was obtained for placement of a tunneled dialysis catheter. The patient was placed supine on the interventional table. Ultrasound confirmed a patent right internal jugular vein. Ultrasound image obtained for documentation. The right neck and chest was prepped and draped in a sterile fashion. Maximal barrier sterile technique was utilized including caps, mask, sterile gowns, sterile gloves, sterile drape, hand hygiene and skin antiseptic. The right neck was  anesthetized with 1% lidocaine. A small incision was made with #11 blade scalpel. A 21 gauge needle directed into the right internal jugular vein with ultrasound guidance. A micropuncture dilator set was placed. A 19 cm tip to cuff Palindrome catheter was selected. The skin below the right clavicle was anesthetized and a small incision was made with an #11 blade scalpel. A subcutaneous tunnel was formed to the vein dermatotomy site. The catheter was brought through the tunnel. The vein dermatotomy site was dilated to accommodate a peel-away sheath. The catheter was placed through the peel-away sheath and directed into the central venous structures. The tip of the catheter was placed at superior cavoatrial junction with fluoroscopy. Fluoroscopic images were obtained for documentation. Both lumens were found to aspirate and flush well. The proper amount of heparin was flushed in both lumens. The vein dermatotomy site was closed using a single layer of absorbable suture and Dermabond. Gel-Foam placed in subcutaneous tract. The catheter was secured to  the skin using Prolene suture. IMPRESSION: Successful placement of a right jugular tunneled dialysis catheter using ultrasound and fluoroscopic guidance. Electronically Signed   By: Markus Daft M.D.   On: 03/31/2022 18:37   IR US Guide Vasc Access Right  Result Date: 03/31/2022 INDICATION: 36 year old with end-stage renal disease and needs a tunneled dialysis catheter. EXAM: FLUOROSCOPIC AND ULTRASOUND GUIDED PLACEMENT OF A TUNNELED DIALYSIS CATHETER Physician: Stephan Minister. Henn, MD MEDICATIONS: Ancef 2 g ANESTHESIA/SEDATION: Moderate (conscious) sedation was employed during this procedure. A total of Versed 2mg  and fentanyl 100 mcg was administered intravenously at the order of the provider performing the procedure. Total intra-service moderate sedation time: 18 minutes. Patient's level of consciousness and vital signs were monitored continuously by radiology nurse throughout the procedure under the supervision of the provider performing the procedure. FLUOROSCOPY TIME:  Radiation Exposure Index (as provided by the fluoroscopic device): 5 mGy Kerma COMPLICATIONS: None immediate. PROCEDURE: Informed consent was obtained for placement of a tunneled dialysis catheter. The patient was placed supine on the interventional table. Ultrasound confirmed a patent right internal jugular vein. Ultrasound image obtained for documentation. The right neck and chest was prepped and draped in a sterile fashion. Maximal barrier sterile technique was utilized including caps, mask, sterile gowns, sterile gloves, sterile drape, hand hygiene and skin antiseptic. The right neck was anesthetized with 1% lidocaine. A small incision was made with #11 blade scalpel. A 21 gauge needle directed into the right internal jugular vein with ultrasound guidance. A micropuncture dilator set was placed. A 19 cm tip to cuff Palindrome catheter was selected. The skin below the right clavicle was anesthetized and a small incision was made with an #11 blade  scalpel. A subcutaneous tunnel was formed to the vein dermatotomy site. The catheter was brought through the tunnel. The vein dermatotomy site was dilated to accommodate a peel-away sheath. The catheter was placed through the peel-away sheath and directed into the central venous structures. The tip of the catheter was placed at superior cavoatrial junction with fluoroscopy. Fluoroscopic images were obtained for documentation. Both lumens were found to aspirate and flush well. The proper amount of heparin was flushed in both lumens. The vein dermatotomy site was closed using a single layer of absorbable suture and Dermabond. Gel-Foam placed in subcutaneous tract. The catheter was secured to the skin using Prolene suture. IMPRESSION: Successful placement of a right jugular tunneled dialysis catheter using ultrasound and fluoroscopic guidance. Electronically Signed   By: Markus Daft M.D.   On: 03/31/2022  18:37        Scheduled Meds:  amLODipine  5 mg Oral Daily   Chlorhexidine Gluconate Cloth  6 each Topical Daily   docusate sodium  100 mg Oral BID   folic acid  1 mg Oral Daily   gelatin adsorbable  1 each Topical Once   nicotine  14 mg Transdermal Daily   sodium chloride flush  3 mL Intravenous Q12H   Continuous Infusions:  sodium chloride     anticoagulant sodium citrate       LOS: 3 days   Time spent= 35 mins    Royale Swamy Arsenio Loader, MD [facility]  If 7PM-7AM, please contact night-coverage  04/02/2022, 10:37 AM

## 2022-04-03 DIAGNOSIS — F1010 Alcohol abuse, uncomplicated: Secondary | ICD-10-CM | POA: Diagnosis not present

## 2022-04-03 DIAGNOSIS — G2401 Drug induced subacute dyskinesia: Secondary | ICD-10-CM | POA: Diagnosis not present

## 2022-04-03 DIAGNOSIS — N179 Acute kidney failure, unspecified: Secondary | ICD-10-CM | POA: Diagnosis not present

## 2022-04-03 DIAGNOSIS — I10 Essential (primary) hypertension: Secondary | ICD-10-CM | POA: Diagnosis not present

## 2022-04-03 LAB — CBC
HCT: 30.3 % — ABNORMAL LOW (ref 39.0–52.0)
Hemoglobin: 10.4 g/dL — ABNORMAL LOW (ref 13.0–17.0)
MCH: 30.1 pg (ref 26.0–34.0)
MCHC: 34.3 g/dL (ref 30.0–36.0)
MCV: 87.8 fL (ref 80.0–100.0)
Platelets: 244 10*3/uL (ref 150–400)
RBC: 3.45 MIL/uL — ABNORMAL LOW (ref 4.22–5.81)
RDW: 13.6 % (ref 11.5–15.5)
WBC: 6.8 10*3/uL (ref 4.0–10.5)
nRBC: 0 % (ref 0.0–0.2)

## 2022-04-03 LAB — RENAL FUNCTION PANEL
Albumin: 3.7 g/dL (ref 3.5–5.0)
Anion gap: 10 (ref 5–15)
BUN: 46 mg/dL — ABNORMAL HIGH (ref 6–20)
CO2: 21 mmol/L — ABNORMAL LOW (ref 22–32)
Calcium: 10.2 mg/dL (ref 8.9–10.3)
Chloride: 109 mmol/L (ref 98–111)
Creatinine, Ser: 9.85 mg/dL — ABNORMAL HIGH (ref 0.61–1.24)
GFR, Estimated: 6 mL/min — ABNORMAL LOW (ref >60)
Glucose, Bld: 95 mg/dL (ref 70–99)
Phosphorus: 6.5 mg/dL — ABNORMAL HIGH (ref 2.5–4.6)
Potassium: 4 mmol/L (ref 3.5–5.1)
Sodium: 140 mmol/L (ref 135–145)

## 2022-04-03 LAB — MAGNESIUM: Magnesium: 2.5 mg/dL — ABNORMAL HIGH (ref 1.7–2.4)

## 2022-04-03 MED ORDER — HEPARIN SODIUM (PORCINE) 1000 UNIT/ML IJ SOLN
INTRAMUSCULAR | Status: AC
  Filled 2022-04-03: qty 4

## 2022-04-03 NOTE — Progress Notes (Signed)
PROGRESS NOTE    William Gregory  RDE:[id number] DOB: February 26, 1986 DOA: 03/30/2022 PCP: Patient, No Pcp Per   Brief Narrative:  36 year old with history of HTN, polysubstance abuse, anemia of chronic disease, schizophrenia, tardive dyskinesia, CKD stage V now ESRD, morbid obesity, tobacco use came to the ED at [facility] long on 10/26 from PCPs office for abnormal lab.  Initially had left AGAINST MEDICAL ADVICE but returned back to the hospital and now agreeable for dialysis.  Transferred here from [place] long to initiate HD.  IR placed tunneled catheter, tolerating HD.   Assessment & Plan:  Principal Problem:   Renal failure Active Problems:   Alcohol abuse   Tobacco abuse   Anemia   Tardive dyskinesia   Essential hypertension    CKD stage V now ESRD -Creatinine is now up to 10.  Nephrology and IR consulted.  IR placed tunneled catheter to initiate HD.  Tolerating HD for now.  Will defer to nephro for vascular consult for permanent access   Essential hypertension, uncontrolled -Norvasc 5 mg daily.  IV as needed ordered Improving with hemodialysis   Anemia of chronic medical/renal disease anemia of chronic disease, hemoglobin 10.  Should get EPO/aranesp with HD, will defer to nephrology.  Folate deficiency - Supplements   Schizophrenia Tardive dyskinesia -Patient receives Invega injection every 3 months   History of polysubstance abuse/cocaine abuse Patient reports no cocaine use over the last 2-3 years.  Counseled on need for complete cessation.   Tobacco use disorder Continues to endorse tobacco abuse.  Counseled on need for complete cessation -- Nicotine patch   Morbid obesity BMI 41.20 kg/m.  Discussed with patient needs for aggressive lifestyle changes/weight loss as this complicates all facets of care.  Outpatient follow-up with PCP.        DVT prophylaxis: SCDs Start: 03/31/22 6314 Code Status: Full Code Family Communication: Mother at bedside  Status is:  Inpatient Remains inpatient appropriate because: Starting HD during this admission by Nephro.       Subjective: Seen in HD, tolerating well.  Examination: Constitutional: Not in acute distress Respiratory: Clear to auscultation bilaterally Cardiovascular: Normal sinus rhythm, no rubs Abdomen: Nontender nondistended good bowel sounds Musculoskeletal: No edema noted Skin: No rashes seen Neurologic: CN 2-12 grossly intact.  And nonfocal Psychiatric: Normal judgment and insight. Alert and oriented x 3. Normal mood.  Right chest wall tunneled catheter in place   Objective: Vitals:   04/02/22 1037 04/02/22 1745 04/02/22 2111 04/03/22 0510  BP: 123/73 (!) 145/97 (!) 141/79 (!) 138/101  Pulse: 76 (!) 108 100 66  Resp: 18 18 18 18   Temp: 98.6 F (37 C) 98.1 F (36.7 C) 98.2 F (36.8 C) 98.2 F (36.8 C)  TempSrc: Oral Oral    SpO2: 98% 100% 100% 93%  Weight:      Height:        Intake/Output Summary (Last 24 hours) at 04/03/2022 0750 Last data filed at 04/03/2022 0510 Gross per 24 hour  Intake 363 ml  Output 0 ml  Net 363 ml   Filed Weights   04/01/22 1458 04/01/22 1733  Weight: 112 kg 113.1 kg     Data Reviewed:   CBC: Recent Labs  Lab 03/30/22 1548 03/31/22 0420 04/01/22 0220 04/02/22 0349 04/03/22 0346  WBC 8.9 7.3 9.0 7.9 6.8  HGB 10.3* 10.3* 10.0* 10.9* 10.4*  HCT 31.1* 31.3* 30.7* 31.7* 30.3*  MCV 90.4 91.0 92.2 88.1 87.8  PLT 268 248 262 278 244   Basic  Metabolic Panel: Recent Labs  Lab 03/30/22 1548 03/31/22 0420 04/01/22 0220 04/02/22 0349 04/03/22 0346  NA 141 139 136 140 140  K 3.6 3.9 3.4* 4.0 4.0  CL 112* 113* 110 107 109  CO2 18* 17* 18* 22 21*  GLUCOSE 122* 95 116* 100* 95  BUN 45* 52* 52* 42* 46*  CREATININE 10.87* 10.59* 11.27* 9.27* 9.85*  CALCIUM 9.7 9.5 9.2 9.9 10.2  MG 2.2  --   --  2.2 2.5*  PHOS  --   --  5.8* 6.0* 6.5*   GFR: Estimated Creatinine Clearance: 11.8 mL/min (A) (by C-G formula based on SCr of 9.85 mg/dL  (H)). Liver Function Tests: Recent Labs  Lab 03/30/22 1548 03/31/22 0420 04/01/22 0220 04/02/22 0349 04/03/22 0346  AST 21 16  --   --   --   ALT 15 14  --   --   --   ALKPHOS 83 79  --   --   --   BILITOT 0.7 0.6  --   --   --   PROT 7.9 7.5  --   --   --   ALBUMIN 4.2 3.8 3.6 3.7 3.7   No results for input(s): "LIPASE", "AMYLASE" in the last 168 hours. No results for input(s): "AMMONIA" in the last 168 hours. Coagulation Profile: No results for input(s): "INR", "PROTIME" in the last 168 hours. Cardiac Enzymes: Recent Labs  Lab 03/29/22 1051  CKTOTAL 1,207*   BNP (last 3 results) No results for input(s): "PROBNP" in the last 8760 hours. HbA1C: No results for input(s): "HGBA1C" in the last 72 hours.  CBG: No results for input(s): "GLUCAP" in the last 168 hours. Lipid Profile: No results for input(s): "CHOL", "HDL", "LDLCALC", "TRIG", "CHOLHDL", "LDLDIRECT" in the last 72 hours. Thyroid Function Tests: No results for input(s): "TSH", "T4TOTAL", "FREET4", "T3FREE", "THYROIDAB" in the last 72 hours. Anemia Panel: No results for input(s): "VITAMINB12", "FOLATE", "FERRITIN", "TIBC", "IRON", "RETICCTPCT" in the last 72 hours.  Sepsis Labs: No results for input(s): "PROCALCITON", "LATICACIDVEN" in the last 168 hours.  No results found for this or any previous visit (from the past 240 hour(s)).       Radiology Studies: No results found.      Scheduled Meds:  (feeding supplement) PROSource Plus  30 mL Oral BID BM   amLODipine  5 mg Oral Daily   Chlorhexidine Gluconate Cloth  6 each Topical Daily   docusate sodium  100 mg Oral BID   feeding supplement (NEPRO CARB STEADY)  237 mL Oral A56P   folic acid  1 mg Oral Daily   gelatin adsorbable  1 each Topical Once   nicotine  14 mg Transdermal Daily   sodium chloride flush  3 mL Intravenous Q12H   Continuous Infusions:  sodium chloride     anticoagulant sodium citrate     ferric gluconate (FERRLECIT) IVPB        LOS: 4 days   Time spent= 35 mins    William Thaw Arsenio Loader, MD [facility]  If 7PM-7AM, please contact night-coverage  04/03/2022, 7:50 AM

## 2022-04-03 NOTE — Progress Notes (Signed)
Requested to see pt for out-pt HD needs at d/c. Met with pt and pt's mother at bedside. Pt and pt's mother prefer [facility]). Referral submitted to [facility] for review. Pt was not agreeable to TCU referral. Pt's mother feels pt will need transportation services to HD at d/c. RN CM made aware of pt/pt's mothers request for assistance with transportation options at d/c. Will assist as needed.   Melven Sartorius Renal Navigator [phone]

## 2022-04-03 NOTE — Progress Notes (Signed)
Received patient in bed to unit.  Alert and oriented.  Informed consent signed and in chart.   Treatment initiated: 1660 Treatment completed: 1110  Patient tolerated well.  Transported back to the room  Alert, without acute distress.  Hand-off given to patient's nurse.   Access used: Catheter Access issues: none  Total UF removed: 100 Medication(s) given: none Post HD VS: 97.9, 140/92(105), HR-56, RR-19, SP02-99 Post HD weight: 113.4kg   [facility]

## 2022-04-03 NOTE — Progress Notes (Signed)
[facility] Progress Note   Subjective:   HD #2 today- days he's feeling great  Objective Vitals:   04/03/22 0900 04/03/22 0930 04/03/22 1000 04/03/22 1030  BP: (!) 141/106 (!) 157/114 (!) 155/97 138/86  Pulse: 69 79 68 62  Resp: 16 16 16  (!) 24  Temp:      TempSrc:      SpO2: 98% 99% 100% 98%  Weight:      Height:       Physical Exam Gen: NAD, sitting in bed Eyes: anicteric ENT:MMM, poor dentition Neck: RIJ TDC c/d/i CV:  RRR Lungs: normal WOB on RA Extr: no edema Neuro: RUE and tongue tremor from dyskinesia, L arm unaffected  Additional Objective Labs: Basic Metabolic Panel: Recent Labs  Lab 04/01/22 0220 04/02/22 0349 04/03/22 0346  NA 136 140 140  K 3.4* 4.0 4.0  CL 110 107 109  CO2 18* 22 21*  GLUCOSE 116* 100* 95  BUN 52* 42* 46*  CREATININE 11.27* 9.27* 9.85*  CALCIUM 9.2 9.9 10.2  PHOS 5.8* 6.0* 6.5*   Liver Function Tests: Recent Labs  Lab 03/30/22 1548 03/31/22 0420 04/01/22 0220 04/02/22 0349 04/03/22 0346  AST 21 16  --   --   --   ALT 15 14  --   --   --   ALKPHOS 83 79  --   --   --   BILITOT 0.7 0.6  --   --   --   PROT 7.9 7.5  --   --   --   ALBUMIN 4.2 3.8 3.6 3.7 3.7   No results for input(s): "LIPASE", "AMYLASE" in the last 168 hours. CBC: Recent Labs  Lab 03/30/22 1548 03/31/22 0420 04/01/22 0220 04/02/22 0349 04/03/22 0346  WBC 8.9 7.3 9.0 7.9 6.8  HGB 10.3* 10.3* 10.0* 10.9* 10.4*  HCT 31.1* 31.3* 30.7* 31.7* 30.3*  MCV 90.4 91.0 92.2 88.1 87.8  PLT 268 248 262 278 244   Blood Culture    Component Value Date/Time   SDES THROAT 12/15/2019 1121   SPECREQUEST NONE 12/15/2019 1121   CULT  12/15/2019 1121    NO GROUP A STREP (S.PYOGENES) ISOLATED Performed at [facility] [address], [place], [place] [address]    REPTSTATUS 12/17/2019 FINAL 12/15/2019 1121    Cardiac Enzymes: Recent Labs  Lab 03/29/22 1051  CKTOTAL 1,207*   CBG: No results for input(s): "GLUCAP" in the last 168  hours. Iron Studies:  No results for input(s): "IRON", "TIBC", "TRANSFERRIN", "FERRITIN" in the last 72 hours.  @[email or web address] @ Studies/Results: No results found. Medications:  sodium chloride     anticoagulant sodium citrate     ferric gluconate (FERRLECIT) IVPB      (feeding supplement) PROSource Plus  30 mL Oral BID BM   amLODipine  5 mg Oral Daily   Chlorhexidine Gluconate Cloth  6 each Topical Daily   docusate sodium  100 mg Oral BID   feeding supplement (NEPRO CARB STEADY)  237 mL Oral Y70W   folic acid  1 mg Oral Daily   gelatin adsorbable  1 each Topical Once   heparin sodium (porcine)       nicotine  14 mg Transdermal Daily   sodium chloride flush  3 mL Intravenous Q12H   Assessment/Plan **ESRD:  progressive CKD from HTN/NSAIDs now with GFR 5, starting dialysis.  S/p [facility] 10/27 in IR.  HD #1 10/28  Discussed perm access - he declines for now, will continue to  educate but he seems to be warming up. Consult [facility] this week if agreeable.  Encouraged him to remain hospitalized to allow outpt HD arrangements to be made. Renal diet.    - HD #2 today, next on Wednesday  - still declining perm access- discussed infection risk  - needs to CLIP   **HTN: resumed home meds and BP fine.    **Anemia:  Hb 10, iron sat 19%, giving IV iron load.  No ESA for now   **Secondary hyperPTH: PTH 477, Phos 5.8.  renal diet.  Calcitriol TIW.   **Tardive dyskinesia:  couldn't use RUE for AV access, L appears unaffected.      Madelon Lips MD 04/03/2022, 11:39 AM  [facility] Pager: [phone]

## 2022-04-03 NOTE — TOC Initial Note (Signed)
Transition of Care [facility]) - Initial/Assessment Note    Patient Details  Name: William Gregory MRN: [id number] Date of Birth: 1986/02/13  Transition of Care [facility]) CM/SW Contact:    Bartholomew Crews, RN Phone Number: [phone] 04/03/2022, 1:35 PM  Clinical Narrative:                  Spoke with patient and mom at the bedside to discuss post acute transition. Confirmed that patient will need transportation for hemodialysis. Advised that he has a transportation benefit with Medicaid and that he will need to call DSS transportation at [phone] to complete assessment which will have to be updated every year. Advised that this is something that he must do and cannot be done for him. TOC following for transition needs.   Expected Discharge Plan: Home/Self Care Barriers to Discharge: Continued Medical Work up   Patient Goals and CMS Choice Patient states their goals for this hospitalization and ongoing recovery are:: return home CMS Medicare.gov Compare Post Acute Care list provided to:: Patient Choice offered to / list presented to : Patient  Expected Discharge Plan and Services Expected Discharge Plan: Home/Self Care   Discharge Planning Services: CM Consult Post Acute Care Choice: Dialysis                   DME Arranged: N/A DME Agency: NA       HH Arranged: NA HH Agency: NA        Prior Living Arrangements/Services     Patient language and need for interpreter reviewed:: Yes Do you feel safe going back to the place where you live?: Yes      Need for Family Participation in Patient Care: No (Comment)     Criminal Activity/Legal Involvement Pertinent to Current Situation/Hospitalization: No - Comment as needed  Activities of Daily Living Home Assistive Devices/Equipment: Eyeglasses ADL Screening (condition at time of admission) Patient's cognitive ability adequate to safely complete daily activities?: Yes Is the patient deaf or have difficulty hearing?: No Does  the patient have difficulty seeing, even when wearing glasses/contacts?: No Does the patient have difficulty concentrating, remembering, or making decisions?: No Patient able to express need for assistance with ADLs?: Yes Does the patient have difficulty dressing or bathing?: No Independently performs ADLs?: Yes (appropriate for developmental age) Does the patient have difficulty walking or climbing stairs?: No Weakness of Legs: None Weakness of Arms/Hands: None  Permission Sought/Granted Permission sought to share information with : Family Supports    Share Information with NAME: William Gregory     Permission granted to share info w Relationship: mom  Permission granted to share info w Contact Information: [phone]  Emotional Assessment Appearance:: Appears stated age Attitude/Demeanor/Rapport: Engaged Affect (typically observed): Accepting Orientation: : Oriented to Situation, Oriented to  Time, Oriented to Place, Oriented to Self Alcohol / Substance Use: Not Applicable Psych Involvement: No (comment)  Admission diagnosis:  Renal failure [N19] AKI (acute kidney injury) ([place]) [N17.9] Patient Active Problem List   Diagnosis Date Noted   Renal failure 03/30/2022   Essential hypertension 03/30/2022   Anemia 03/29/2022   Screening for diabetes mellitus 03/29/2022   Marijuana use 03/29/2022   Cocaine abuse in remission ([place]) 03/29/2022   Tobacco abuse counseling 03/29/2022   Health care maintenance 03/29/2022   Tardive dyskinesia 03/29/2022   Need for influenza vaccination 03/29/2022   High blood pressure 03/29/2022   Hyperparathyroidism, secondary renal ([place]) 03/29/2022   AKI (acute kidney injury) ([place]) 08/14/2015   Nausea  vomiting and diarrhea 08/14/2015   Abdominal pain 08/14/2015   GERD (gastroesophageal reflux disease) 08/14/2015   Acute kidney injury [facility])    ACUTE KIDNEY FAILURE UNSPECIFIED 07/09/2009   Tobacco abuse 06/07/2009   KNEE PAIN, RIGHT, CHRONIC  01/15/2009   Backache 01/15/2009   LEG EDEMA, BILATERAL 12/15/2008   Alcohol abuse 04/02/2008   Depression 04/02/2008   MYOCARDIAL INFARCTION, HX OF 04/02/2008   Asthma 04/02/2008   PCP:  Patient, No Pcp Per Pharmacy:   [facility], [place] [facility] [place] [address] Phone: [phone] Fax: [phone]  [id number] - [place], [place] [address] [place] [address] Phone: [phone] Fax: [phone]     Social Determinants of Health (SDOH) Interventions    Readmission Risk Interventions     No data to display

## 2022-04-03 NOTE — Procedures (Signed)
Patient seen and examined on Hemodialysis. BP 138/86 (BP Location: Left Arm)   Pulse 62   Temp 97.9 F (36.6 C) (Oral)   Resp (!) 24   Ht 5\' 5"  (1.651 m)   Wt 113.5 kg   SpO2 98%   BMI 41.64 kg/m   QB 300 mL/ min via TDC, UF goal 1L  Tolerating treatment without complaints at this time.  Feeling OK.   Madelon Lips MD [place] [facility] [phone] 11:41 AM

## 2022-04-04 ENCOUNTER — Other Ambulatory Visit (HOSPITAL_COMMUNITY): Payer: Self-pay

## 2022-04-04 DIAGNOSIS — N185 Chronic kidney disease, stage 5: Secondary | ICD-10-CM | POA: Diagnosis not present

## 2022-04-04 DIAGNOSIS — N179 Acute kidney failure, unspecified: Secondary | ICD-10-CM | POA: Diagnosis not present

## 2022-04-04 DIAGNOSIS — F1010 Alcohol abuse, uncomplicated: Secondary | ICD-10-CM | POA: Diagnosis not present

## 2022-04-04 DIAGNOSIS — F209 Schizophrenia, unspecified: Secondary | ICD-10-CM | POA: Insufficient documentation

## 2022-04-04 LAB — RENAL FUNCTION PANEL
Albumin: 3.5 g/dL (ref 3.5–5.0)
Anion gap: 12 (ref 5–15)
BUN: 39 mg/dL — ABNORMAL HIGH (ref 6–20)
CO2: 21 mmol/L — ABNORMAL LOW (ref 22–32)
Calcium: 9.9 mg/dL (ref 8.9–10.3)
Chloride: 108 mmol/L (ref 98–111)
Creatinine, Ser: 8.74 mg/dL — ABNORMAL HIGH (ref 0.61–1.24)
GFR, Estimated: 7 mL/min — ABNORMAL LOW (ref >60)
Glucose, Bld: 98 mg/dL (ref 70–99)
Phosphorus: 6 mg/dL — ABNORMAL HIGH (ref 2.5–4.6)
Potassium: 4.3 mmol/L (ref 3.5–5.1)
Sodium: 141 mmol/L (ref 135–145)

## 2022-04-04 LAB — MAGNESIUM: Magnesium: 2.2 mg/dL (ref 1.7–2.4)

## 2022-04-04 LAB — CBC
HCT: 30.6 % — ABNORMAL LOW (ref 39.0–52.0)
Hemoglobin: 10.2 g/dL — ABNORMAL LOW (ref 13.0–17.0)
MCH: 30 pg (ref 26.0–34.0)
MCHC: 33.3 g/dL (ref 30.0–36.0)
MCV: 90 fL (ref 80.0–100.0)
Platelets: 229 10*3/uL (ref 150–400)
RBC: 3.4 MIL/uL — ABNORMAL LOW (ref 4.22–5.81)
RDW: 13.6 % (ref 11.5–15.5)
WBC: 7.2 10*3/uL (ref 4.0–10.5)
nRBC: 0 % (ref 0.0–0.2)

## 2022-04-04 MED ORDER — SODIUM CHLORIDE 0.9 % IV SOLN: 125.0000 mg | INTRAVENOUS | Status: DC

## 2022-04-04 MED ORDER — DOCUSATE SODIUM 100 MG PO CAPS
100.0000 mg | ORAL_CAPSULE | Freq: Two times a day (BID) | ORAL | 0 refills | Status: AC
  Filled 2022-04-04 (×2): qty 14, 7d supply, fill #0

## 2022-04-04 MED ORDER — HYDROCODONE-ACETAMINOPHEN 5-325 MG PO TABS
1.0000 | ORAL_TABLET | Freq: Four times a day (QID) | ORAL | 0 refills | Status: DC | PRN
  Filled 2022-04-04 (×2): qty 10, 2d supply, fill #0

## 2022-04-04 NOTE — Discharge Summary (Signed)
Physician Discharge Summary  DARE SANGER XVQ:[id number] DOB: 1985-07-19 DOA: 03/30/2022  PCP: Patient, No Pcp Per  Admit date: 03/30/2022 Discharge date: 04/04/2022  Admitted From: Home Disposition:  Home  Recommendations for Outpatient Follow-up:  Follow up with PCP in 1-2 weeks Please obtain BMP/CBC in one week your next doctors visit.  Outpatient follow-up with nephrology and eventually vascular for permanent access Outpatient HD per nephrology  Discharge Condition: Stable CODE STATUS: Full code Diet recommendation: Renal  Brief/Interim Summary: 36 year old with history of HTN, polysubstance abuse, anemia of chronic disease, schizophrenia, tardive dyskinesia, CKD stage V now ESRD, morbid obesity, tobacco use [facility] the ED [facility] on 10/26 from PCPs office for abnormal lab.  Initially had left AGAINST MEDICAL ADVICE but returned back to the hospital and now agreeable for dialysis.  Transferred here from [facility] to initiate HD.  IR placed tunneled catheter, tolerating HD.  Patient tolerated HD during hospitalization.  Medically stable for discharge once outpatient HD chair time has been arranged by nephrology.     Assessment & Plan:  Principal Problem:   Renal failure Active Problems:   Alcohol abuse   Tobacco abuse   Anemia   Tardive dyskinesia   Essential hypertension    CKD stage V now ESRD -Creatinine is now up to 10.  Nephrology and IR consulted.  IR placed tunneled catheter to initiate HD.  Tolerating HD for now.  Will defer to nephro for vascular consult for permanent access, this can be arranged outpatient.  Nephro Education officer, museum working on outpatient HD chair time   Essential hypertension, uncontrolled -Norvasc 5 mg daily.   Anemia of chronic medical/renal disease anemia of chronic disease, hemoglobin 10.  Should get EPO/aranesp with HD, will defer to nephrology.   Folate deficiency - Supplements   Schizophrenia Tardive dyskinesia -Patient  receives Invega injection every 3 months   History of polysubstance abuse/cocaine abuse Patient reports no cocaine use over the last 2-3 years.  Counseled on need for complete cessation.   Tobacco use disorder Continues to endorse tobacco abuse.  Counseled on need for complete cessation -- Nicotine patch   Morbid obesity BMI 41.20 kg/m.  Discussed with patient needs for aggressive lifestyle changes/weight loss as this complicates all facets of care.  Outpatient follow-up with PCP.         Discharge Diagnoses:  Principal Problem:   Renal failure Active Problems:   Alcohol abuse   Tobacco abuse   Anemia   Tardive dyskinesia   Essential hypertension      Consultations: Nephrology  Subjective: Doing well no complaints.  Very eager to leave the hospital  Discharge Exam: Vitals:   04/04/22 0531 04/04/22 0914  BP: 124/75 134/64  Pulse: (!) 58 62  Resp: 18   Temp: 98.2 F (36.8 C) 97.6 F (36.4 C)  SpO2: 97% 98%   Vitals:   04/03/22 1825 04/03/22 2121 04/04/22 0531 04/04/22 0914  BP: 128/77 123/78 124/75 134/64  Pulse: 71 69 (!) 58 62  Resp: 19 18 18    Temp: 98.1 F (36.7 C) 98.4 F (36.9 C) 98.2 F (36.8 C) 97.6 F (36.4 C)  TempSrc: Oral Oral Oral Oral  SpO2: 97% 98% 97% 98%  Weight:      Height:        General: Pt is alert, awake, not in acute distress Cardiovascular: RRR, S1/S2 +, no rubs, no gallops Respiratory: CTA bilaterally, no wheezing, no rhonchi Abdominal: Soft, NT, ND, bowel sounds + Extremities: no edema,  no cyanosis Right chest wall tunneled catheter Discharge Instructions   Allergies as of 04/04/2022       Reactions   Aspirin Swelling   Ibuprofen Rash        Medication List     TAKE these medications    acetaminophen 500 MG tablet Commonly known as: TYLENOL Take 500 mg by mouth every 6 (six) hours as needed for moderate pain.   albuterol 108 (90 Base) MCG/ACT inhaler Commonly known as: VENTOLIN HFA Inhale 2 puffs  into the lungs every 4 (four) hours as needed for wheezing or shortness of breath.   amLODipine 5 MG tablet Commonly known as: NORVASC Take 1 tablet (5 mg total) by mouth daily.   docusate sodium 100 MG capsule Commonly known as: COLACE Take 1 capsule (100 mg total) by mouth 2 (two) times daily for 7 days.   HYDROcodone-acetaminophen 5-325 MG tablet Commonly known as: NORCO/VICODIN Take 1-2 tablets by mouth every 6 (six) hours as needed for severe pain.   Lorayne Bender Trinza 819 MG/2.63ML injection Generic drug: paliperidone Palmitate ER Inject 819 mg into the muscle every 3 (three) months.        Allergies  Allergen Reactions   Aspirin Swelling   Ibuprofen Rash    You were cared for by a hospitalist during your hospital stay. If you have any questions about your discharge medications or the care you received while you were in the hospital after you are discharged, you can call the unit and asked to speak with the hospitalist on call if the hospitalist that took care of you is not available. Once you are discharged, your primary care physician will handle any further medical issues. Please note that no refills for any discharge medications will be authorized once you are discharged, as it is imperative that you return to your primary care physician (or establish a relationship with a primary care physician if you do not have one) for your aftercare needs so that they can reassess your need for medications and monitor your lab values.   Procedures/Studies: IR Fluoro Guide CV Line Right  Result Date: 04/03/2022 INDICATION: 35 year old with end-stage renal disease and needs a tunneled dialysis catheter. EXAM: FLUOROSCOPIC AND ULTRASOUND GUIDED PLACEMENT OF A TUNNELED DIALYSIS CATHETER Physician: Stephan Minister. Henn, MD MEDICATIONS: Ancef 2 g ANESTHESIA/SEDATION: Moderate (conscious) sedation was employed during this procedure. A total of Versed 2mg  and fentanyl 100 mcg was administered  intravenously at the order of the provider performing the procedure. Total intra-service moderate sedation time: 18 minutes. Patient's level of consciousness and vital signs were monitored continuously by radiology nurse throughout the procedure under the supervision of the provider performing the procedure. FLUOROSCOPY TIME:  Radiation Exposure Index (as provided by the fluoroscopic device): 5 mGy Kerma COMPLICATIONS: None immediate. PROCEDURE: Informed consent was obtained for placement of a tunneled dialysis catheter. The patient was placed supine on the interventional table. Ultrasound confirmed a patent right internal jugular vein. Ultrasound image obtained for documentation. The right neck and chest was prepped and draped in a sterile fashion. Maximal barrier sterile technique was utilized including caps, mask, sterile gowns, sterile gloves, sterile drape, hand hygiene and skin antiseptic. The right neck was anesthetized with 1% lidocaine. A small incision was made with #11 blade scalpel. A 21 gauge needle directed into the right internal jugular vein with ultrasound guidance. A micropuncture dilator set was placed. A 19 cm tip to cuff Palindrome catheter was selected. The skin below the right clavicle was anesthetized and  a small incision was made with an #11 blade scalpel. A subcutaneous tunnel was formed to the vein dermatotomy site. The catheter was brought through the tunnel. The vein dermatotomy site was dilated to accommodate a peel-away sheath. The catheter was placed through the peel-away sheath and directed into the central venous structures. The tip of the catheter was placed at superior cavoatrial junction with fluoroscopy. Fluoroscopic images were obtained for documentation. Both lumens were found to aspirate and flush well. The proper amount of heparin was flushed in both lumens. The vein dermatotomy site was closed using a single layer of absorbable suture and Dermabond. Gel-Foam placed in  subcutaneous tract. The catheter was secured to the skin using Prolene suture. IMPRESSION: Successful placement of a right jugular tunneled dialysis catheter using ultrasound and fluoroscopic guidance. Electronically Signed   By: Markus Daft M.D.   On: 04/03/2022 15:50   IR TUNNELED CENTRAL VENOUS CATHETER PLACEMENT  Result Date: 03/31/2022 INDICATION: 36 year old with end-stage renal disease and needs a tunneled dialysis catheter. EXAM: FLUOROSCOPIC AND ULTRASOUND GUIDED PLACEMENT OF A TUNNELED DIALYSIS CATHETER Physician: Stephan Minister. Henn, MD MEDICATIONS: Ancef 2 g ANESTHESIA/SEDATION: Moderate (conscious) sedation was employed during this procedure. A total of Versed 2mg  and fentanyl 100 mcg was administered intravenously at the order of the provider performing the procedure. Total intra-service moderate sedation time: 18 minutes. Patient's level of consciousness and vital signs were monitored continuously by radiology nurse throughout the procedure under the supervision of the provider performing the procedure. FLUOROSCOPY TIME:  Radiation Exposure Index (as provided by the fluoroscopic device): 5 mGy Kerma COMPLICATIONS: None immediate. PROCEDURE: Informed consent was obtained for placement of a tunneled dialysis catheter. The patient was placed supine on the interventional table. Ultrasound confirmed a patent right internal jugular vein. Ultrasound image obtained for documentation. The right neck and chest was prepped and draped in a sterile fashion. Maximal barrier sterile technique was utilized including caps, mask, sterile gowns, sterile gloves, sterile drape, hand hygiene and skin antiseptic. The right neck was anesthetized with 1% lidocaine. A small incision was made with #11 blade scalpel. A 21 gauge needle directed into the right internal jugular vein with ultrasound guidance. A micropuncture dilator set was placed. A 19 cm tip to cuff Palindrome catheter was selected. The skin below the right clavicle  was anesthetized and a small incision was made with an #11 blade scalpel. A subcutaneous tunnel was formed to the vein dermatotomy site. The catheter was brought through the tunnel. The vein dermatotomy site was dilated to accommodate a peel-away sheath. The catheter was placed through the peel-away sheath and directed into the central venous structures. The tip of the catheter was placed at superior cavoatrial junction with fluoroscopy. Fluoroscopic images were obtained for documentation. Both lumens were found to aspirate and flush well. The proper amount of heparin was flushed in both lumens. The vein dermatotomy site was closed using a single layer of absorbable suture and Dermabond. Gel-Foam placed in subcutaneous tract. The catheter was secured to the skin using Prolene suture. IMPRESSION: Successful placement of a right jugular tunneled dialysis catheter using ultrasound and fluoroscopic guidance. Electronically Signed   By: Markus Daft M.D.   On: 03/31/2022 18:37   IR US Guide Vasc Access Right  Result Date: 03/31/2022 INDICATION: 36 year old with end-stage renal disease and needs a tunneled dialysis catheter. EXAM: FLUOROSCOPIC AND ULTRASOUND GUIDED PLACEMENT OF A TUNNELED DIALYSIS CATHETER Physician: Stephan Minister. Henn, MD MEDICATIONS: Ancef 2 g ANESTHESIA/SEDATION: Moderate (conscious) sedation was employed during  this procedure. A total of Versed 2mg  and fentanyl 100 mcg was administered intravenously at the order of the provider performing the procedure. Total intra-service moderate sedation time: 18 minutes. Patient's level of consciousness and vital signs were monitored continuously by radiology nurse throughout the procedure under the supervision of the provider performing the procedure. FLUOROSCOPY TIME:  Radiation Exposure Index (as provided by the fluoroscopic device): 5 mGy Kerma COMPLICATIONS: None immediate. PROCEDURE: Informed consent was obtained for placement of a tunneled dialysis catheter.  The patient was placed supine on the interventional table. Ultrasound confirmed a patent right internal jugular vein. Ultrasound image obtained for documentation. The right neck and chest was prepped and draped in a sterile fashion. Maximal barrier sterile technique was utilized including caps, mask, sterile gowns, sterile gloves, sterile drape, hand hygiene and skin antiseptic. The right neck was anesthetized with 1% lidocaine. A small incision was made with #11 blade scalpel. A 21 gauge needle directed into the right internal jugular vein with ultrasound guidance. A micropuncture dilator set was placed. A 19 cm tip to cuff Palindrome catheter was selected. The skin below the right clavicle was anesthetized and a small incision was made with an #11 blade scalpel. A subcutaneous tunnel was formed to the vein dermatotomy site. The catheter was brought through the tunnel. The vein dermatotomy site was dilated to accommodate a peel-away sheath. The catheter was placed through the peel-away sheath and directed into the central venous structures. The tip of the catheter was placed at superior cavoatrial junction with fluoroscopy. Fluoroscopic images were obtained for documentation. Both lumens were found to aspirate and flush well. The proper amount of heparin was flushed in both lumens. The vein dermatotomy site was closed using a single layer of absorbable suture and Dermabond. Gel-Foam placed in subcutaneous tract. The catheter was secured to the skin using Prolene suture. IMPRESSION: Successful placement of a right jugular tunneled dialysis catheter using ultrasound and fluoroscopic guidance. Electronically Signed   By: Markus Daft M.D.   On: 03/31/2022 18:37   DG CHEST PORT 1 VIEW  Result Date: 03/30/2022 CLINICAL DATA:  Acute renal insufficiency. EXAM: PORTABLE CHEST 1 VIEW COMPARISON:  Chest radiograph dated 03/20/2022. FINDINGS: The heart size and mediastinal contours are within normal limits. Both lungs are  clear. The visualized skeletal structures are unremarkable. IMPRESSION: No active disease. Electronically Signed   By: Anner Crete M.D.   On: 03/30/2022 20:14   US RENAL  Result Date: 03/20/2022 CLINICAL DATA:  Acute kidney injury EXAM: RENAL / URINARY TRACT ULTRASOUND COMPLETE COMPARISON:  CT 08/14/2015 FINDINGS: Right Kidney: Renal measurements: 8.4 x 4.7 x 4.9 cm = volume: 102.2 mL. Echogenic cortex. No hydronephrosis. Fewer than 10 cysts in the right kidney. The largest is seen at the upper pole and measures 21 mm. No follow-up imaging is recommended. Left Kidney: Renal measurements: 7.4 x 3.9 x 5 cm = volume: 75.5 mL. Echogenic cortex. No hydronephrosis. No mass. Bladder: Slightly thick walled in appearance. Prevoid bladder volume 203.5 mL. Patient was unable to void. Other: None. IMPRESSION: 1. Echogenic kidneys bilaterally consistent with medical renal disease. No hydronephrosis. 2. Nonspecific thick-walled appearance of the bladder Electronically Signed   By: Donavan Foil M.D.   On: 03/20/2022 22:18   DG Chest 2 View  Result Date: 03/20/2022 CLINICAL DATA:  Shortness of breath EXAM: CHEST - 2 VIEW COMPARISON:  04/21/2018 FINDINGS: The heart size and mediastinal contours are within normal limits. Both lungs are clear. The visualized skeletal structures are unremarkable.  IMPRESSION: No active cardiopulmonary disease. Electronically Signed   By: Van Clines M.D.   On: 03/20/2022 18:20     The results of significant diagnostics from this hospitalization (including imaging, microbiology, ancillary and laboratory) are listed below for reference.     Microbiology: No results found for this or any previous visit (from the past 240 hour(s)).   Labs: BNP (last 3 results) No results for input(s): "BNP" in the last 8760 hours. Basic Metabolic Panel: Recent Labs  Lab 03/30/22 1548 03/31/22 0420 04/01/22 0220 04/02/22 0349 04/03/22 0346 04/04/22 0428  NA 141 139 136 140 140 141   K 3.6 3.9 3.4* 4.0 4.0 4.3  CL 112* 113* 110 107 109 108  CO2 18* 17* 18* 22 21* 21*  GLUCOSE 122* 95 116* 100* 95 98  BUN 45* 52* 52* 42* 46* 39*  CREATININE 10.87* 10.59* 11.27* 9.27* 9.85* 8.74*  CALCIUM 9.7 9.5 9.2 9.9 10.2 9.9  MG 2.2  --   --  2.2 2.5* 2.2  PHOS  --   --  5.8* 6.0* 6.5* 6.0*   Liver Function Tests: Recent Labs  Lab 03/30/22 1548 03/31/22 0420 04/01/22 0220 04/02/22 0349 04/03/22 0346 04/04/22 0428  AST 21 16  --   --   --   --   ALT 15 14  --   --   --   --   ALKPHOS 83 79  --   --   --   --   BILITOT 0.7 0.6  --   --   --   --   PROT 7.9 7.5  --   --   --   --   ALBUMIN 4.2 3.8 3.6 3.7 3.7 3.5   No results for input(s): "LIPASE", "AMYLASE" in the last 168 hours. No results for input(s): "AMMONIA" in the last 168 hours. CBC: Recent Labs  Lab 03/31/22 0420 04/01/22 0220 04/02/22 0349 04/03/22 0346 04/04/22 0428  WBC 7.3 9.0 7.9 6.8 7.2  HGB 10.3* 10.0* 10.9* 10.4* 10.2*  HCT 31.3* 30.7* 31.7* 30.3* 30.6*  MCV 91.0 92.2 88.1 87.8 90.0  PLT 248 262 278 244 229   Cardiac Enzymes: Recent Labs  Lab 03/29/22 1051  CKTOTAL 1,207*   BNP: Invalid input(s): "POCBNP" CBG: No results for input(s): "GLUCAP" in the last 168 hours. D-Dimer No results for input(s): "DDIMER" in the last 72 hours. Hgb A1c No results for input(s): "HGBA1C" in the last 72 hours. Lipid Profile No results for input(s): "CHOL", "HDL", "LDLCALC", "TRIG", "CHOLHDL", "LDLDIRECT" in the last 72 hours. Thyroid function studies No results for input(s): "TSH", "T4TOTAL", "T3FREE", "THYROIDAB" in the last 72 hours.  Invalid input(s): "FREET3" Anemia work up No results for input(s): "VITAMINB12", "FOLATE", "FERRITIN", "TIBC", "IRON", "RETICCTPCT" in the last 72 hours. Urinalysis    Component Value Date/Time   COLORURINE COLORLESS (A) 03/30/2022 1540   APPEARANCEUR CLEAR 03/30/2022 1540   LABSPEC 1.003 (L) 03/30/2022 1540   LABSPEC 1.015 03/29/2022 1118   PHURINE 6.0  03/30/2022 1540   GLUCOSEU NEGATIVE 03/30/2022 1540   HGBUR SMALL (A) 03/30/2022 1540   HGBUR negative 12/15/2008 1313   BILIRUBINUR NEGATIVE 03/30/2022 1540   BILIRUBINUR negative 03/29/2022 1118   KETONESUR NEGATIVE 03/30/2022 1540   PROTEINUR 100 (A) 03/30/2022 1540   UROBILINOGEN 1.0 04/21/2009 0315   NITRITE NEGATIVE 03/30/2022 1540   LEUKOCYTESUR NEGATIVE 03/30/2022 1540   Sepsis Labs Recent Labs  Lab 04/01/22 0220 04/02/22 0349 04/03/22 0346 04/04/22 0428  WBC 9.0 7.9 6.8 7.2  Microbiology No results found for this or any previous visit (from the past 240 hour(s)).   Time coordinating discharge:  I have spent 35 minutes face to face with the patient and on the ward discussing the patients care, assessment, plan and disposition with other care givers. >50% of the time was devoted counseling the patient about the risks and benefits of treatment/Discharge disposition and coordinating care.   SIGNED:   Damita Lack, MD  [facility] 04/04/2022, 11:42 AM   If 7PM-7AM, please contact night-coverage

## 2022-04-04 NOTE — Progress Notes (Signed)
Pt has been accepted at [facility]) on TTS 12:00 chair time. Pt can start on Thursday and will need to arrive at 11:10 to complete paperwork prior to treatment. Met with pt, pt's sign other, pt's mother, and pt's aunt at bedside. Discussed above details and all agreeable to plan. Update provided to nephrologist and contacted renal NP regarding clinic's need for orders. Pt is working to complete assessment for transportation through [employer]. Pt's aunt to assist with transportation until pt is approved for transportation services. Appt details added to pt's AVS and a schedule letter provided to pt's mother. Clinic aware pt will d/c today and start on Thursday. Update provided to attending, pt's RN, RN CM, and CSW via secure chat.   Melven Sartorius Renal Navigator [phone]

## 2022-04-04 NOTE — Progress Notes (Signed)
William Gregory to be discharged Home per MD order. Patient refused to go over the AVS. Mother present with him.  Skin clean, dry and intact without evidence of skin break down, no evidence of skin tears noted. IV catheter discontinued intact. Site without signs and symptoms of complications. Dressing and pressure applied. Pt denies pain at the site currently. No complaints noted.  Patient free of lines, drains, and wounds.   An After Visit Summary (AVS) was printed and given to the patient. Patient walked out of the unit with mom ad girl friend.  Amaryllis Dyke, RN

## 2022-04-04 NOTE — Progress Notes (Signed)
[facility] Progress Note   Subjective:   Tolerated HD well yesterday,  has been accepted to [facility]  Objective Vitals:   04/03/22 1825 04/03/22 2121 04/04/22 0531 04/04/22 0914  BP: 128/77 123/78 124/75 134/64  Pulse: 71 69 (!) 58 62  Resp: 19 18 18    Temp: 98.1 F (36.7 C) 98.4 F (36.9 C) 98.2 F (36.8 C) 97.6 F (36.4 C)  TempSrc: Oral Oral Oral Oral  SpO2: 97% 98% 97% 98%  Weight:      Height:       Physical Exam Gen: NAD, sitting in bed Eyes: anicteric ENT:MMM, poor dentition Neck: RIJ TDC c/d/i CV:  RRR Lungs: normal WOB on RA Extr: no edema Neuro: RUE and tongue tremor from dyskinesia, L arm unaffected  Additional Objective Labs: Basic Metabolic Panel: Recent Labs  Lab 04/02/22 0349 04/03/22 0346 04/04/22 0428  NA 140 140 141  K 4.0 4.0 4.3  CL 107 109 108  CO2 22 21* 21*  GLUCOSE 100* 95 98  BUN 42* 46* 39*  CREATININE 9.27* 9.85* 8.74*  CALCIUM 9.9 10.2 9.9  PHOS 6.0* 6.5* 6.0*   Liver Function Tests: Recent Labs  Lab 03/30/22 1548 03/31/22 0420 04/01/22 0220 04/02/22 0349 04/03/22 0346 04/04/22 0428  AST 21 16  --   --   --   --   ALT 15 14  --   --   --   --   ALKPHOS 83 79  --   --   --   --   BILITOT 0.7 0.6  --   --   --   --   PROT 7.9 7.5  --   --   --   --   ALBUMIN 4.2 3.8   < > 3.7 3.7 3.5   < > = values in this interval not displayed.   No results for input(s): "LIPASE", "AMYLASE" in the last 168 hours. CBC: Recent Labs  Lab 03/31/22 0420 04/01/22 0220 04/02/22 0349 04/03/22 0346 04/04/22 0428  WBC 7.3 9.0 7.9 6.8 7.2  HGB 10.3* 10.0* 10.9* 10.4* 10.2*  HCT 31.3* 30.7* 31.7* 30.3* 30.6*  MCV 91.0 92.2 88.1 87.8 90.0  PLT 248 262 278 244 229   Blood Culture    Component Value Date/Time   SDES THROAT 12/15/2019 1121   SPECREQUEST NONE 12/15/2019 1121   CULT  12/15/2019 1121    NO GROUP A STREP (S.PYOGENES) ISOLATED Performed at [facility] [address] [place], [place] [address]     REPTSTATUS 12/17/2019 FINAL 12/15/2019 1121    Cardiac Enzymes: Recent Labs  Lab 03/29/22 1051  CKTOTAL 1,207*   CBG: No results for input(s): "GLUCAP" in the last 168 hours. Iron Studies:  No results for input(s): "IRON", "TIBC", "TRANSFERRIN", "FERRITIN" in the last 72 hours.  @lablastinr3 @ Studies/Results: No results found. Medications:  sodium chloride     ferric gluconate (FERRLECIT) IVPB 125 mg (04/03/22 1256)    (feeding supplement) PROSource Plus  30 mL Oral BID BM   amLODipine  5 mg Oral Daily   Chlorhexidine Gluconate Cloth  6 each Topical Daily   docusate sodium  100 mg Oral BID   feeding supplement (NEPRO CARB STEADY)  237 mL Oral T06Y   folic acid  1 mg Oral Daily   gelatin adsorbable  1 each Topical Once   nicotine  14 mg Transdermal Daily   sodium chloride flush  3 mL Intravenous Q12H   Assessment/Plan **ESRD:  progressive CKD  from HTN/NSAIDs now with GFR 5, starting dialysis.  S/p [facility] 10/27 in IR.  HD #1 10/28  Discussed perm access - he declines for now, will continue to educate but he seems to be warming up. Consult VVS this week if agreeable.  Encouraged him to remain hospitalized to allow outpt HD arrangements to be made. Renal diet.    - HD #2 10/30, can have HD #3 Thursday  - still declining perm access- discussed infection risk  - CLIP'd- ok for d/c   **HTN: resumed home meds and BP fine.    **Anemia:  Hb 10, iron sat 19%, giving IV iron load.  No ESA for now   **Secondary hyperPTH: PTH 477, Phos 5.8.  renal diet.  Calcitriol TIW.   **Tardive dyskinesia:  couldn't use RUE for AV access, L appears unaffected.      Madelon Lips MD 04/04/2022, 1:00 PM  [facility] Pager: [phone]

## 2022-04-05 ENCOUNTER — Telehealth: Payer: Self-pay | Admitting: Nephrology

## 2022-04-05 NOTE — Telephone Encounter (Signed)
Transition of care contact from inpatient facility  Date of discharge: 04/04/22 Date of contact  04/05/22:  Method: Phone Spoke to: Patient  Patient contacted to discuss transition of care from recent inpatient hospitalization. Patient was admitted to [facility] from 03/30/22 to 04/04/22... with discharge diagnosis of .Marland KitchenMarland KitchenCKD 5 progressed to ESRD need to start HD / Uncontrolled Essential HTN  Medication changes were reviewed.  Patient will follow up with his/her outpatient HD unit on:

## 2022-04-26 ENCOUNTER — Ambulatory Visit: Payer: Self-pay | Admitting: Nurse Practitioner

## 2022-05-05 ENCOUNTER — Ambulatory Visit: Payer: Self-pay | Admitting: Nurse Practitioner

## 2022-05-12 ENCOUNTER — Encounter: Payer: Self-pay | Admitting: Nurse Practitioner

## 2022-05-12 ENCOUNTER — Ambulatory Visit (INDEPENDENT_AMBULATORY_CARE_PROVIDER_SITE_OTHER): Payer: Medicaid Other | Admitting: Nurse Practitioner

## 2022-05-12 VITALS — BP 135/70 | HR 74 | Temp 98.0°F | Wt 246.4 lb

## 2022-05-12 DIAGNOSIS — I10 Essential (primary) hypertension: Secondary | ICD-10-CM | POA: Diagnosis not present

## 2022-05-12 DIAGNOSIS — N186 End stage renal disease: Secondary | ICD-10-CM

## 2022-05-12 DIAGNOSIS — D638 Anemia in other chronic diseases classified elsewhere: Secondary | ICD-10-CM | POA: Diagnosis not present

## 2022-05-12 DIAGNOSIS — Z716 Tobacco abuse counseling: Secondary | ICD-10-CM | POA: Diagnosis not present

## 2022-05-12 MED ORDER — AMLODIPINE BESYLATE 5 MG PO TABS: 5.0000 mg | ORAL_TABLET | Freq: Every day | ORAL | 1 refills | Status: DC

## 2022-05-12 NOTE — Assessment & Plan Note (Signed)
BP Readings from Last 3 Encounters:  05/12/22 135/70  04/04/22 134/64  03/29/22 127/77  Chronic medical condition currently good condition currently well-controlled on amlodipine 5 mg daily Medication refilled DASH diet advised patient encouraged to engage in regular moderate exercises at least 150 minutes weekly Checking BMP today

## 2022-05-12 NOTE — Assessment & Plan Note (Signed)
Smokes about 5 cigarettes daily  Asked about quitting: confirms that he/she currently smokes cigarettes Advise to quit smoking: Educated about QUITTING to reduce the risk of cancer, cardio and cerebrovascular disease. Assess willingness: Unwilling to quit at this time, but is working on cutting back. Assist with counseling and pharmacotherapy: Counseled for 5 minutes and literature provided. Arrange for follow up: follow up in 4 months and continue to offer help.

## 2022-05-12 NOTE — Assessment & Plan Note (Signed)
Lab Results  Component Value Date   WBC 7.2 04/04/2022   HGB 10.2 (L) 04/04/2022   HCT 30.6 (L) 04/04/2022   MCV 90.0 04/04/2022   PLT 229 04/04/2022  Check CBC today Patient was encouraged to follow-up with nephrology and attend HD sessions as planned.  Will defer management to nephrology

## 2022-05-12 NOTE — Progress Notes (Signed)
Established Patient Office Visit  Subjective:  Patient ID: William Gregory, male    DOB: 1985/09/04  Age: 36 y.o. MRN: [id number]  CC:  Chief Complaint  Patient presents with   Follow-up    HPI William Gregory is a 36 y.o. male with past medical history of tardive dyskinesia, substance abuse, schizophrenia, end-stage renal disease, morbid obesity, tobacco use presents for follow-up for hospital admission.  Patient was admitted at the hospital between March 30, 2022 and April 04, 2022.    End-stage renal disease.  patient currently undergoing hemodialysis has IR placed 20 catheter, goes for hemodialysis on Tuesday Thursdays and Saturdays.  Patient stated that he missed dialysis this week because his grandbaby was sick and admitted in the hospital.  He has been going to dialysis where be going, but they come like sometimes so dialysis center is arranging another means of transportation for him.  Patient currently denies chest pain, shortness of breath, dizziness, edema.  Hypertension.  Currently on amlodipine 5 mg daily.  Patient denies dizziness, edema, chest pain  Tobacco abuse.  Smokes 5 cigarettes daily patient denies shortness of breath, wheezing, cough.  Tardive dyskinesia . has upcoming appointment in January with neurology .     Past Medical History:  Diagnosis Date   Alcohol abuse    Asthma    Back pain    Depression    Knee joint pain, left    Neck pain    Psychiatric problem    unspecified by patient (receives "injections" bi-weekly)   Tobacco abuse     Past Surgical History:  Procedure Laterality Date   arm surgery     IR FLUORO GUIDE CV LINE RIGHT  03/31/2022   IR PERC TUN PERIT CATH WO PORT S&I /IMAG  03/31/2022   IR US GUIDE VASC ACCESS RIGHT  03/31/2022    Family History  Problem Relation Age of Onset   Diabetes Mother    Diabetes Father    CVA Father    Dementia Maternal Grandmother    Kidney disease Maternal Grandmother    Heart  attack Neg Hx     Social History   Socioeconomic History   Marital status: Single    Spouse name: Not on file   Number of children: Not on file   Years of education: Not on file   Highest education level: Not on file  Occupational History   Not on file  Tobacco Use   Smoking status: Every Day    Packs/day: 1.00    Types: Cigarettes   Smokeless tobacco: Never  Vaping Use   Vaping Use: Former  Substance and Sexual Activity   Alcohol use: Not Currently    Comment: sober x "months"   Drug use: Yes    Types: Cocaine, Marijuana    Comment: Hasn't used cocaine in "months"   Sexual activity: Yes  Other Topics Concern   Not on file  Social History Narrative   Right handed   Unemployed/disability   Ninth grade level of education   Social Determinants of Health   Financial Resource Strain: Not on file  Food Insecurity: No Food Insecurity (03/30/2022)   Hunger Vital Sign    Worried About Running Out of Food in the Last Year: Never true    Ran Out of Food in the Last Year: Never true  Recent Concern: Food Insecurity - Food Insecurity Present (03/20/2022)   Hunger Vital Sign    Worried About Running Out of Food in the  Last Year: Sometimes true    Ran Out of Food in the Last Year: Patient refused  Transportation Needs: No Transportation Needs (03/30/2022)   PRAPARE - Hydrologist (Medical): No    Lack of Transportation (Non-Medical): No  Physical Activity: Not on file  Stress: Not on file  Social Connections: Not on file  Intimate Partner Violence: Unknown (03/20/2022)   Humiliation, Afraid, Rape, and Kick questionnaire    Fear of Current or Ex-Partner: Patient refused    Emotionally Abused: Not on file    Physically Abused: Not on file    Sexually Abused: Not on file    Outpatient Medications Prior to Visit  Medication Sig Dispense Refill   acetaminophen (TYLENOL) 500 MG tablet Take 500 mg by mouth every 6 (six) hours as needed for moderate  pain.     albuterol (VENTOLIN HFA) 108 (90 Base) MCG/ACT inhaler Inhale 2 puffs into the lungs every 4 (four) hours as needed for wheezing or shortness of breath. 18 g 0   HYDROcodone-acetaminophen (NORCO/VICODIN) 5-325 MG tablet Take 1-2 tablets by mouth every 6 (six) hours as needed for severe pain. 10 tablet 0   paliperidone Palmitate ER (INVEGA TRINZA) 819 MG/2.63ML injection Inject 819 mg into the muscle every 3 (three) months.     amLODipine (NORVASC) 5 MG tablet Take 1 tablet (5 mg total) by mouth daily. 30 tablet 2   No facility-administered medications prior to visit.    Allergies  Allergen Reactions   Aspirin Swelling   Ibuprofen Rash    ROS Review of Systems  Constitutional:  Negative for activity change, appetite change, chills, diaphoresis, fatigue and fever.  Respiratory:  Negative for apnea, cough, choking, chest tightness, shortness of breath and stridor.   Cardiovascular: Negative.  Negative for chest pain, palpitations and leg swelling.  Gastrointestinal:  Negative for abdominal distention.  Genitourinary:  Negative for difficulty urinating, flank pain and frequency.  Neurological:  Positive for tremors. Negative for dizziness, facial asymmetry, light-headedness, numbness and headaches.  Psychiatric/Behavioral:  Negative for agitation and self-injury. The patient is not nervous/anxious and is not hyperactive.       Objective:    Physical Exam Constitutional:      General: He is not in acute distress.    Appearance: He is obese. He is not ill-appearing, toxic-appearing or diaphoretic.  Eyes:     General: No scleral icterus.       Right eye: No discharge.        Left eye: No discharge.     Extraocular Movements: Extraocular movements intact.     Conjunctiva/sclera: Conjunctivae normal.  Cardiovascular:     Rate and Rhythm: Normal rate and regular rhythm.     Pulses: Normal pulses.     Heart sounds: Normal heart sounds. No murmur heard.    No friction rub. No  gallop.     Comments: HD cath on right upper chest, dressing clean and dry Pulmonary:     Effort: Pulmonary effort is normal. No respiratory distress.     Breath sounds: Normal breath sounds. No stridor. No wheezing, rhonchi or rales.  Chest:     Chest wall: No tenderness.  Abdominal:     General: There is no distension.     Palpations: Abdomen is soft.     Tenderness: There is no abdominal tenderness. There is no guarding.  Musculoskeletal:        General: No tenderness, deformity or signs of injury.  Right lower leg: No edema.     Left lower leg: No edema.  Skin:    General: Skin is warm and dry.     Capillary Refill: Capillary refill takes less than 2 seconds.     Coloration: Skin is not jaundiced or pale.     Findings: No bruising, erythema, lesion or rash.  Neurological:     Mental Status: He is alert.     Cranial Nerves: No cranial nerve deficit.     Motor: No weakness.     Coordination: Coordination normal.     Gait: Gait normal.     Comments: Right upper extremity rest tremor noted, patient ambulating without difficulty  Psychiatric:        Mood and Affect: Mood normal.        Behavior: Behavior normal.        Thought Content: Thought content normal.        Judgment: Judgment normal.     BP 135/70   Pulse 74   Temp 98 F (36.7 C)   Wt 246 lb 6.4 oz (111.8 kg)   SpO2 100%   BMI 41.00 kg/m  Wt Readings from Last 3 Encounters:  05/12/22 246 lb 6.4 oz (111.8 kg)  04/03/22 250 lb (113.4 kg)  03/29/22 247 lb 9.6 oz (112.3 kg)    Lab Results  Component Value Date   TSH 3.36 03/20/2022   Lab Results  Component Value Date   WBC 7.2 04/04/2022   HGB 10.2 (L) 04/04/2022   HCT 30.6 (L) 04/04/2022   MCV 90.0 04/04/2022   PLT 229 04/04/2022   Lab Results  Component Value Date   NA 141 04/04/2022   K 4.3 04/04/2022   CO2 21 (L) 04/04/2022   GLUCOSE 98 04/04/2022   BUN 39 (H) 04/04/2022   CREATININE 8.74 (H) 04/04/2022   BILITOT 0.6 03/31/2022    ALKPHOS 79 03/31/2022   AST 16 03/31/2022   ALT 14 03/31/2022   PROT 7.5 03/31/2022   ALBUMIN 3.5 04/04/2022   CALCIUM 9.9 04/04/2022   ANIONGAP 12 04/04/2022   EGFR 6 (L) 03/29/2022   GFR 6.36 (LL) 03/20/2022   Lab Results  Component Value Date   CHOL 144 04/02/2008   Lab Results  Component Value Date   HDL 49 04/02/2008   Lab Results  Component Value Date   LDLCALC 83 04/02/2008   Lab Results  Component Value Date   TRIG 62 04/02/2008   Lab Results  Component Value Date   CHOLHDL 2.9 Ratio 04/02/2008   Lab Results  Component Value Date   HGBA1C 5.6 03/29/2022      Assessment & Plan:   Problem List Items Addressed This Visit       Cardiovascular and Mediastinum   Essential hypertension    BP Readings from Last 3 Encounters:  05/12/22 135/70  04/04/22 134/64  03/29/22 127/77  Chronic medical condition currently good condition currently well-controlled on amlodipine 5 mg daily Medication refilled DASH diet advised patient encouraged to engage in regular moderate exercises at least 150 minutes weekly Checking BMP today      Relevant Medications   amLODipine (NORVASC) 5 MG tablet     Genitourinary   ESRD (end stage renal disease) ([place]) - Primary    Currently on hemodialysis on Tuesdays Thursdays and Saturdays May 2 + hemodialysis session regularly was discussed with patient he verbalized understanding HD cath on right chest dressing clean and dry I discussed need for permanent HD catheter  with patient patient stated that he is not ready for that at this time      Relevant Orders   CBC   Basic Metabolic Panel     Other   Anemia of chronic disease    Lab Results  Component Value Date   WBC 7.2 04/04/2022   HGB 10.2 (L) 04/04/2022   HCT 30.6 (L) 04/04/2022   MCV 90.0 04/04/2022   PLT 229 04/04/2022  Check CBC today Patient was encouraged to follow-up with nephrology and attend HD sessions as planned.  Will defer management to nephrology       Tobacco abuse counseling    Smokes about 5 cigarettes daily  Asked about quitting: confirms that he/she currently smokes cigarettes Advise to quit smoking: Educated about QUITTING to reduce the risk of cancer, cardio and cerebrovascular disease. Assess willingness: Unwilling to quit at this time, but is working on cutting back. Assist with counseling and pharmacotherapy: Counseled for 5 minutes and literature provided. Arrange for follow up: follow up in 4 months and continue to offer help.        Meds ordered this encounter  Medications   amLODipine (NORVASC) 5 MG tablet    Sig: Take 1 tablet (5 mg total) by mouth daily.    Dispense:  90 tablet    Refill:  1    Follow-up: Return in about 4 months (around 09/11/2022) for HTN/.    Renee Rival, FNP

## 2022-05-12 NOTE — Assessment & Plan Note (Addendum)
Currently on hemodialysis on Tuesdays Thursdays and Saturdays Need to attend hemodialysis session regularly was discussed with patient he verbalized understanding HD cath on right chest dressing clean and dry I discussed need for permanent HD catheter with patient patient stated that he is not ready for that at this time

## 2022-05-12 NOTE — Patient Instructions (Addendum)
Please  continue to attend dialysis as scheduled.   It is important that you exercise regularly at least 30 minutes 5 times a week as tolerated  Think about what you will eat, plan ahead. Choose " clean, green, fresh or frozen" over canned, processed or packaged foods which are more sugary, salty and fatty. 70 to 75% of food eaten should be vegetables and fruit. Three meals at set times with snacks allowed between meals, but they must be fruit or vegetables. Aim to eat over a 12 hour period , example 7 am to 7 pm, and STOP after  your last meal of the day. Drink water,generally about 64 ounces per day, no other drink is as healthy. Fruit juice is best enjoyed in a healthy way, by EATING the fruit.  Thanks for choosing [name] we consider it a privelige to serve you.

## 2022-05-13 LAB — CBC
Hematocrit: 30.6 % — ABNORMAL LOW (ref 37.5–51.0)
Hemoglobin: 10.2 g/dL — ABNORMAL LOW (ref 13.0–17.7)
MCH: 29.5 pg (ref 26.6–33.0)
MCHC: 33.3 g/dL (ref 31.5–35.7)
MCV: 88 fL (ref 79–97)
Platelets: 269 10*3/uL (ref 150–450)
RBC: 3.46 x10E6/uL — ABNORMAL LOW (ref 4.14–5.80)
RDW: 13.8 % (ref 11.6–15.4)
WBC: 8.5 10*3/uL (ref 3.4–10.8)

## 2022-05-13 LAB — BASIC METABOLIC PANEL
BUN/Creatinine Ratio: 4 — ABNORMAL LOW (ref 9–20)
BUN: 44 mg/dL — ABNORMAL HIGH (ref 6–20)
CO2: 17 mmol/L — ABNORMAL LOW (ref 20–29)
Calcium: 9.7 mg/dL (ref 8.7–10.2)
Chloride: 108 mmol/L — ABNORMAL HIGH (ref 96–106)
Creatinine, Ser: 10.51 mg/dL — ABNORMAL HIGH (ref 0.76–1.27)
Glucose: 83 mg/dL (ref 70–99)
Potassium: 4.3 mmol/L (ref 3.5–5.2)
Sodium: 142 mmol/L (ref 134–144)
eGFR: 6 mL/min/{1.73_m2} — ABNORMAL LOW (ref >59)

## 2022-05-13 NOTE — Progress Notes (Signed)
Stable blood count, and electrolytes . Patient should make sure to attend HD session regularly as planned.

## 2022-06-02 ENCOUNTER — Telehealth: Payer: Self-pay | Admitting: Nurse Practitioner

## 2022-06-02 NOTE — Telephone Encounter (Signed)
Caller & Relationship to patient:  MRN #  [id number]   Call Back Number:   Date of Last Office Visit: 05/12/2022     Date of Next Office Visit: 09/11/2022    Medication(s) to be Refilled: Hydrocodone   Preferred Pharmacy:   ** Please notify patient to allow 48-72 hours to process** **Let patient know to contact pharmacy at the end of the day to make sure medication is ready. ** **If patient has not been seen in a year or longer, book an appointment **Advise to use MyChart for refill requests OR to contact their pharmacy

## 2022-06-06 NOTE — Telephone Encounter (Signed)
Pt was advised [facility] 

## 2022-06-09 NOTE — Progress Notes (Signed)
Lvm for pt to call back for labs . [facility]

## 2022-06-20 ENCOUNTER — Other Ambulatory Visit: Payer: Self-pay | Admitting: *Deleted

## 2022-06-20 DIAGNOSIS — N186 End stage renal disease: Secondary | ICD-10-CM

## 2022-06-25 ENCOUNTER — Emergency Department (HOSPITAL_COMMUNITY): Payer: Medicaid Other

## 2022-06-25 ENCOUNTER — Emergency Department (HOSPITAL_COMMUNITY)
Admission: EM | Admit: 2022-06-25 | Discharge: 2022-06-25 | Disposition: A | Discharge status: Home / Self Care | Payer: Medicaid Other | Attending: Emergency Medicine | Admitting: Emergency Medicine

## 2022-06-25 ENCOUNTER — Ambulatory Visit (HOSPITAL_COMMUNITY)
Admission: RE | Admit: 2022-06-25 | Discharge: 2022-06-25 | Disposition: A | Discharge status: Home / Self Care | Payer: Medicaid Other | Source: Ambulatory Visit | Attending: Nurse Practitioner | Admitting: Nurse Practitioner

## 2022-06-25 ENCOUNTER — Other Ambulatory Visit: Payer: Self-pay

## 2022-06-25 ENCOUNTER — Encounter (HOSPITAL_COMMUNITY): Payer: Self-pay

## 2022-06-25 VITALS — BP 149/84 | HR 94 | Temp 99.7°F | Resp 20

## 2022-06-25 DIAGNOSIS — M792 Neuralgia and neuritis, unspecified: Secondary | ICD-10-CM

## 2022-06-25 DIAGNOSIS — M542 Cervicalgia: Secondary | ICD-10-CM | POA: Diagnosis present

## 2022-06-25 DIAGNOSIS — M25511 Pain in right shoulder: Secondary | ICD-10-CM | POA: Diagnosis not present

## 2022-06-25 DIAGNOSIS — R202 Paresthesia of skin: Secondary | ICD-10-CM | POA: Diagnosis not present

## 2022-06-25 DIAGNOSIS — M549 Dorsalgia, unspecified: Secondary | ICD-10-CM

## 2022-06-25 DIAGNOSIS — M546 Pain in thoracic spine: Secondary | ICD-10-CM | POA: Diagnosis not present

## 2022-06-25 DIAGNOSIS — M62830 Muscle spasm of back: Secondary | ICD-10-CM | POA: Diagnosis not present

## 2022-06-25 DIAGNOSIS — M62838 Other muscle spasm: Secondary | ICD-10-CM

## 2022-06-25 HISTORY — DX: Disorder of kidney and ureter, unspecified: N28.9

## 2022-06-25 LAB — CBC WITH DIFFERENTIAL/PLATELET
Abs Immature Granulocytes: 0.04 10*3/uL (ref 0.00–0.07)
Basophils Absolute: 0.1 10*3/uL (ref 0.0–0.1)
Basophils Relative: 1 %
Eosinophils Absolute: 0.1 10*3/uL (ref 0.0–0.5)
Eosinophils Relative: 1 %
HCT: 31 % — ABNORMAL LOW (ref 39.0–52.0)
Hemoglobin: 10.2 g/dL — ABNORMAL LOW (ref 13.0–17.0)
Immature Granulocytes: 0 %
Lymphocytes Relative: 16 %
Lymphs Abs: 1.7 10*3/uL (ref 0.7–4.0)
MCH: 30.4 pg (ref 26.0–34.0)
MCHC: 32.9 g/dL (ref 30.0–36.0)
MCV: 92.3 fL (ref 80.0–100.0)
Monocytes Absolute: 1.1 10*3/uL — ABNORMAL HIGH (ref 0.1–1.0)
Monocytes Relative: 10 %
Neutro Abs: 7.7 10*3/uL (ref 1.7–7.7)
Neutrophils Relative %: 72 %
Platelets: 239 10*3/uL (ref 150–400)
RBC: 3.36 MIL/uL — ABNORMAL LOW (ref 4.22–5.81)
RDW: 13.3 % (ref 11.5–15.5)
WBC: 10.7 10*3/uL — ABNORMAL HIGH (ref 4.0–10.5)
nRBC: 0 % (ref 0.0–0.2)

## 2022-06-25 LAB — BASIC METABOLIC PANEL
Anion gap: 12 (ref 5–15)
BUN: 45 mg/dL — ABNORMAL HIGH (ref 6–20)
CO2: 22 mmol/L (ref 22–32)
Calcium: 9.1 mg/dL (ref 8.9–10.3)
Chloride: 101 mmol/L (ref 98–111)
Creatinine, Ser: 9.36 mg/dL — ABNORMAL HIGH (ref 0.61–1.24)
GFR, Estimated: 7 mL/min — ABNORMAL LOW (ref >60)
Glucose, Bld: 92 mg/dL (ref 70–99)
Potassium: 3.7 mmol/L (ref 3.5–5.1)
Sodium: 135 mmol/L (ref 135–145)

## 2022-06-25 LAB — C-REACTIVE PROTEIN: CRP: 11.5 mg/dL — ABNORMAL HIGH (ref <1.0)

## 2022-06-25 LAB — SEDIMENTATION RATE: Sed Rate: 80 mm/hr — ABNORMAL HIGH (ref 0–16)

## 2022-06-25 MED ORDER — LIDOCAINE 5 % EX PTCH
1.0000 | MEDICATED_PATCH | CUTANEOUS | Status: DC
  Administered 2022-06-25: 1 via TRANSDERMAL
  Filled 2022-06-25: qty 1

## 2022-06-25 MED ORDER — HYDROCODONE-ACETAMINOPHEN 5-325 MG PO TABS: 1.0000 | ORAL_TABLET | Freq: Four times a day (QID) | ORAL | 0 refills | Status: DC | PRN

## 2022-06-25 MED ORDER — HYDROCODONE-ACETAMINOPHEN 5-325 MG PO TABS
1.0000 | ORAL_TABLET | Freq: Once | ORAL | Status: AC
  Administered 2022-06-25: 1 via ORAL
  Filled 2022-06-25: qty 1

## 2022-06-25 MED ORDER — LIDOCAINE 5 % EX PTCH: 1.0000 | MEDICATED_PATCH | CUTANEOUS | 0 refills | Status: DC

## 2022-06-25 MED ORDER — CYCLOBENZAPRINE HCL 10 MG PO TABS: 10.0000 mg | ORAL_TABLET | Freq: Two times a day (BID) | ORAL | 0 refills | Status: DC | PRN

## 2022-06-25 MED ORDER — CYCLOBENZAPRINE HCL 10 MG PO TABS
5.0000 mg | ORAL_TABLET | Freq: Once | ORAL | Status: AC
  Administered 2022-06-25: 5 mg via ORAL
  Filled 2022-06-25: qty 1

## 2022-06-25 NOTE — ED Triage Notes (Addendum)
Patient c/o right posterior and right lateral neck pain, right shoulder and right arm pain x 4 days. Patient states he has numbness from the right elbow to the right hand. Patient also c/o right lateral neck swelling x 4 days.

## 2022-06-25 NOTE — ED Notes (Signed)
Pt given water. A&Ox4, ABCs intact, NAD Noted.

## 2022-06-25 NOTE — Care Management (Signed)
The patient has insurance, therefore is not eligible for [facility] medication assistance

## 2022-06-25 NOTE — ED Notes (Signed)
Pt to Xray.

## 2022-06-25 NOTE — Discharge Instructions (Addendum)
Return to the emergency department for any falls and neck pain or any trauma to the neck, new weakness or numbness in the arms or legs, urinary or fecal incontinence, numbness around the groin.  Return for fever and inability to move your neck.  Take Tylenol, Norco, lidocaine patch, Flexeril for muscle spasm.  Follow-up with your PCP.

## 2022-06-25 NOTE — ED Provider Notes (Signed)
[place]    CSN: [id number] Arrival date & time: 06/25/22  1112      History   Chief Complaint Chief Complaint  Patient presents with   Neck Pain   Shoulder Pain   Arm Pain    HPI William Gregory is a 37 y.o. male.   Patient with history of ESRD on dialysis, tardive dyskinesia, HTN, MI, and asthma presents urgent care for evaluation of right shoulder and right-sided neck pain that started 3 days ago on Thursday, June 22, 2022.  Pain has significantly worsened since onset to the point where patient is no longer able to sleep at night without sleep disruption from pain.  Denies known injury or trauma to the right neck/right shoulder. He states he believes he may have "slept on his shoulder and neck wrong" and is requesting muscle relaxer. Pain from right shoulder/neck radiates to the right elbow/right wrist. Patient's family member who is present with him at visit states she has noticed redness to the neck/shoulder area. No recent fever/chills, although patient's temperature at urgent care is 99.7. No recent antibiotic or steroid use. Pain is very intense with slight movement and light touch, currently 10 on a scale of 0-10. He has been taking tylenol at home for pain without relief. Dialysis catheter present to the right chest was recently used for last dialysis session yesterday. He denies redness and drainage from the catheter site, cough, congestion, and sore throat. Denies chest pain, shortness of breath, and orthopnea. No history of gout.    Neck Pain Shoulder Pain Associated symptoms: neck pain   Arm Pain    Past Medical History:  Diagnosis Date   Alcohol abuse    Asthma    Back pain    Depression    Knee joint pain, left    Neck pain    Psychiatric problem    unspecified by patient (receives "injections" bi-weekly)   Renal disorder    Tobacco abuse     Patient Active Problem List   Diagnosis Date Noted   ESRD (end stage renal disease) ([place])  05/12/2022   Renal failure 03/30/2022   Essential hypertension 03/30/2022   Anemia of chronic disease 03/29/2022   Screening for diabetes mellitus 03/29/2022   Marijuana use 03/29/2022   Cocaine abuse in remission ([place]) 03/29/2022   Tobacco abuse counseling 03/29/2022   Health care maintenance 03/29/2022   Tardive dyskinesia 03/29/2022   Need for influenza vaccination 03/29/2022   High blood pressure 03/29/2022   Hyperparathyroidism, secondary renal ([place]) 03/29/2022   AKI (acute kidney injury) ([place]) 08/14/2015   Nausea vomiting and diarrhea 08/14/2015   Abdominal pain 08/14/2015   GERD (gastroesophageal reflux disease) 08/14/2015   Acute kidney injury ([place])    ACUTE KIDNEY FAILURE UNSPECIFIED 07/09/2009   Tobacco abuse 06/07/2009   KNEE PAIN, RIGHT, CHRONIC 01/15/2009   Backache 01/15/2009   LEG EDEMA, BILATERAL 12/15/2008   Alcohol abuse 04/02/2008   Depression 04/02/2008   MYOCARDIAL INFARCTION, HX OF 04/02/2008   Asthma 04/02/2008    Past Surgical History:  Procedure Laterality Date   arm surgery     IR FLUORO GUIDE CV LINE RIGHT  03/31/2022   IR PERC TUN PERIT CATH WO PORT S&I /IMAG  03/31/2022   IR US GUIDE VASC ACCESS RIGHT  03/31/2022       Home Medications    Prior to Admission medications   Medication Sig Start Date End Date Taking? Authorizing Provider  acetaminophen (TYLENOL) 500 MG tablet Take  500 mg by mouth every 6 (six) hours as needed for moderate pain.    [provider]  albuterol (VENTOLIN HFA) 108 (90 Base) MCG/ACT inhaler Inhale 2 puffs into the lungs every 4 (four) hours as needed for wheezing or shortness of breath. 02/12/22   Barrett Henle, MD  amLODipine (NORVASC) 5 MG tablet Take 1 tablet (5 mg total) by mouth daily. 05/12/22   Renee Rival, FNP  HYDROcodone-acetaminophen (NORCO/VICODIN) 5-325 MG tablet Take 1-2 tablets by mouth every 6 (six) hours as needed for severe pain. 04/04/22   Amin, Jeanella Flattery, MD  paliperidone  Palmitate ER (INVEGA TRINZA) 819 MG/2.63ML injection Inject 819 mg into the muscle every 3 (three) months.    [provider]    Family History Family History  Problem Relation Age of Onset   Diabetes Mother    Diabetes Father    CVA Father    Dementia Maternal Grandmother    Kidney disease Maternal Grandmother    Heart attack Neg Hx     Social History Social History   Tobacco Use   Smoking status: Every Day    Packs/day: 1.00    Types: Cigarettes   Smokeless tobacco: Never  Vaping Use   Vaping Use: Former  Substance Use Topics   Alcohol use: Not Currently    Comment: sober x "months"   Drug use: Yes    Types: Marijuana     Allergies   Aspirin and Ibuprofen   Review of Systems Review of Systems  Musculoskeletal:  Positive for neck pain.  Per HPI   Physical Exam Triage Vital Signs ED Triage Vitals [06/25/22 1150]  Enc Vitals Group     BP (!) 149/84     Pulse Rate 94     Resp 20     Temp 99.7 F (37.6 C)     Temp Source Oral     SpO2 96 %     Weight      Height      Head Circumference      Peak Flow      Pain Score 10     Pain Loc      Pain Edu?      Excl. in [place]?    No data found.  Updated Vital Signs BP (!) 149/84 (BP Location: Right Arm)   Pulse 94   Temp 99.7 F (37.6 C) (Oral)   Resp 20   SpO2 96%   Visual Acuity Right Eye Distance:   Left Eye Distance:   Bilateral Distance:    Right Eye Near:   Left Eye Near:    Bilateral Near:     Physical Exam Vitals and nursing note reviewed.  Constitutional:      Appearance: He is obese. He is ill-appearing. He is not toxic-appearing.  HENT:     Head: Normocephalic and atraumatic.     Right Ear: Hearing and external ear normal.     Left Ear: Hearing and external ear normal.     Nose: Nose normal.     Mouth/Throat:     Lips: Pink.     Mouth: Mucous membranes are moist.     Pharynx: No posterior oropharyngeal erythema.  Eyes:     General: Lids are normal. Vision grossly  intact. Gaze aligned appropriately.     Extraocular Movements: Extraocular movements intact.     Conjunctiva/sclera: Conjunctivae normal.  Neck:     Comments: TTP of the generalized right trapezius muscle. No midline cervical  tenderness to palpation.  Cardiovascular:     Rate and Rhythm: Normal rate and regular rhythm.     Heart sounds: Normal heart sounds, S1 normal and S2 normal.  Pulmonary:     Effort: Pulmonary effort is normal. No respiratory distress.     Breath sounds: Normal breath sounds and air entry.  Musculoskeletal:     Right shoulder: Swelling, tenderness and bony tenderness present. No deformity, effusion, laceration or crepitus. Decreased range of motion. Normal strength. Normal pulse.     Left shoulder: Normal.       Arms:     Cervical back: Normal range of motion and neck supple. No edema, erythema, signs of trauma, rigidity, torticollis or crepitus. Pain with movement and muscular tenderness present. No spinous process tenderness. Normal range of motion.     Comments: Slight erythema and swelling to the right shoulder, right upper arm, and right forearm present to the right shoulder with significantly decreased ROM due to tenderness. No lymphangitis. Significant tenderness with light touch of the right shoulder and right upper back. No obvious deformities. 5/5 grip strength to bilateral upper extremities, sensation intact to bilateral upper extremities.   Lymphadenopathy:     Cervical: No cervical adenopathy.  Skin:    General: Skin is warm and dry.     Capillary Refill: Capillary refill takes less than 2 seconds.     Findings: No rash.  Neurological:     General: No focal deficit present.     Mental Status: He is alert and oriented to person, place, and time. Mental status is at baseline.     Cranial Nerves: No dysarthria or facial asymmetry.  Psychiatric:        Mood and Affect: Mood normal.        Speech: Speech normal.        Behavior: Behavior normal.         Thought Content: Thought content normal.        Judgment: Judgment normal.      UC Treatments / Results  Labs (all labs ordered are listed, but only abnormal results are displayed) Labs Reviewed - No data to display  EKG   Radiology No results found.  Procedures Procedures (including critical care time)  Medications Ordered in UC Medications - No data to display  Initial Impression / Assessment and Plan / UC Course  I have reviewed the triage vital signs and the nursing notes.  Pertinent labs & imaging results that were available during my care of the patient were reviewed by me and considered in my medical decision making (see chart for details).   1. Radicular pain in right arm, severe pain of right shoulder Presentation is concerning for possible infectious etiology based on physical exam findings and significant subjective pain. Dialysis catheter appears to be intact, however there is trace swelling of the generalized right shoulder, upper arm, and right forearm as compared to the left arm. No lymphangitis appreciated to exam. Patient has not had an IV to the right arm recently. He has a low grade fever in clinic at 99.7. Discussed case with MD attending. Recommend further workup with imaging and urgent blood work in the ER for further evaluation and to rule out emergent causes of patient's significant shoulder pain versus muscle strain. Discussed these findings and recommendations with patient and family member who both express understanding and agreement with plan. Discussed risk of deferring ED visit, they voice understanding. Patient discharged from urgent care in stable condition to  go to ED via POV.   Final Clinical Impressions(s) / UC Diagnoses   Final diagnoses:  Radicular pain in right arm  Severe pain of right shoulder     Discharge Instructions      Please go to the nearest ER for further workup and evaluation of your right arm pain. I am very worried that  your shoulder joint may be very infected or have another abnormality due to severe pain.  Please go to the ER immediately.   ED Prescriptions   None    PDMP not reviewed this encounter.   Joella Prince [place], [place] 06/27/22 [phone]

## 2022-06-25 NOTE — Discharge Instructions (Addendum)
Please go to the nearest ER for further workup and evaluation of your right arm pain. I am very worried that your shoulder joint may be very infected or have another abnormality due to severe pain.  Please go to the ER immediately.

## 2022-06-25 NOTE — ED Triage Notes (Signed)
PT reports pain in neck, back and right arm starting 4 days ago. Pt tried a "warm rag, tylenol  and pain patch that took pain right out" pt states he is here for "pain meds a muscle relaxer of something".

## 2022-06-25 NOTE — ED Provider Notes (Addendum)
[facility] Provider Note   CSN: [id number] Arrival date & time: 06/25/22  1248     History  Chief Complaint  Patient presents with   Neck Pain   Back Pain   Arm Injury    Right     William Gregory is a 37 y.o. male.   Neck Pain Back Pain Arm Injury Associated symptoms: back pain and neck pain      37 year old male presenting to the emergency department with neck pain radiating down his right arm.  The patient states that the pain has been ongoing for the past 4 days.  He denies any falls or trauma.  He has been put on light duty at work.  He endorses tingling down his arms.  Denies any numbness or weakness.  Denies any urinary or fecal incontinence.  Denies any fevers or chills.  He has been trying Tylenol and lidocaine patches at home with some relief with the lidocaine patches.  He is requesting a muscle relaxant due to muscle spasms.  Home Medications Prior to Admission medications   Medication Sig Start Date End Date Taking? Authorizing Provider  cyclobenzaprine (FLEXERIL) 10 MG tablet Take 1 tablet (10 mg total) by mouth 2 (two) times daily as needed for muscle spasms. 06/25/22  Yes Regan Lemming, MD  HYDROcodone-acetaminophen (NORCO/VICODIN) 5-325 MG tablet Take 1-2 tablets by mouth every 6 (six) hours as needed. 06/25/22  Yes Regan Lemming, MD  lidocaine (LIDODERM) 5 % Place 1 patch onto the skin daily. Remove & Discard patch within 12 hours or as directed by MD 06/25/22  Yes Regan Lemming, MD  acetaminophen (TYLENOL) 500 MG tablet Take 500 mg by mouth every 6 (six) hours as needed for moderate pain.    [provider]  albuterol (VENTOLIN HFA) 108 (90 Base) MCG/ACT inhaler Inhale 2 puffs into the lungs every 4 (four) hours as needed for wheezing or shortness of breath. 02/12/22   Barrett Henle, MD  amLODipine (NORVASC) 5 MG tablet Take 1 tablet (5 mg total) by mouth daily. 05/12/22   Renee Rival, FNP   paliperidone Palmitate ER (INVEGA TRINZA) 819 MG/2.63ML injection Inject 819 mg into the muscle every 3 (three) months.    [provider]      Allergies    Aspirin and Ibuprofen    Review of Systems   Review of Systems  Musculoskeletal:  Positive for back pain and neck pain.  All other systems reviewed and are negative.   Physical Exam Updated Vital Signs BP (!) 152/91 (BP Location: Right Arm)   Pulse 96   Temp 99.2 F (37.3 C) (Oral)   SpO2 100%  Physical Exam Vitals and nursing note reviewed.  Constitutional:      General: He is not in acute distress. HENT:     Head: Normocephalic and atraumatic.  Eyes:     Conjunctiva/sclera: Conjunctivae normal.     Pupils: Pupils are equal, round, and reactive to light.  Neck:     Comments: No midline tenderness to palpation of the cervical spine, range of motion intact, negative Spurling sign Cardiovascular:     Rate and Rhythm: Normal rate and regular rhythm.  Pulmonary:     Effort: Pulmonary effort is normal. No respiratory distress.  Abdominal:     General: There is no distension.     Tenderness: There is no guarding.  Musculoskeletal:        General: No deformity or signs of  injury.     Cervical back: Normal range of motion and neck supple.     Comments: Tenderness to palpation of the trapezius muscle of the right upper back with associated spasm. ROM intact of the right shoulder, some pain with attempts at ROM, on repeat assessment no pain with any attempts at ROM  Skin:    Findings: No lesion or rash.  Neurological:     General: No focal deficit present.     Mental Status: He is alert. Mental status is at baseline.     Comments: No cranial nerve deficit, 5 out of 5 strength in the bilateral upper extremities, ambulatory without ataxia     ED Results / Procedures / Treatments   Labs (all labs ordered are listed, but only abnormal results are displayed) Labs Reviewed  CBC WITH DIFFERENTIAL/PLATELET -  Abnormal; Notable for the following components:      Result Value   WBC 10.7 (*)    RBC 3.36 (*)    Hemoglobin 10.2 (*)    HCT 31.0 (*)    Monocytes Absolute 1.1 (*)    All other components within normal limits  BASIC METABOLIC PANEL - Abnormal; Notable for the following components:   BUN 45 (*)    Creatinine, Ser 9.36 (*)    GFR, Estimated 7 (*)    All other components within normal limits  SEDIMENTATION RATE - Abnormal; Notable for the following components:   Sed Rate 80 (*)    All other components within normal limits  C-REACTIVE PROTEIN - Abnormal; Notable for the following components:   CRP 11.5 (*)    All other components within normal limits    EKG None  Radiology DG Shoulder Right  Result Date: 06/25/2022 CLINICAL DATA:  RIGHT shoulder and arm pain for 4 days EXAM: RIGHT SHOULDER - 2+ VIEW COMPARISON:  None available FINDINGS: RIGHT jugular central venous catheter with tip projecting over SVC. Osseous mineralization normal. AC joint alignment normal. No acute fracture, dislocation or bone destruction. Visualized ribs unremarkable. IMPRESSION: Normal exam. Electronically Signed   By: Lavonia Dana M.D.   On: 06/25/2022 15:02    Procedures Procedures    Medications Ordered in ED Medications  lidocaine (LIDODERM) 5 % 1 patch (1 patch Transdermal Patch Applied 06/25/22 1353)  HYDROcodone-acetaminophen (NORCO/VICODIN) 5-325 MG per tablet 1 tablet (1 tablet Oral Given 06/25/22 1352)  cyclobenzaprine (FLEXERIL) tablet 5 mg (5 mg Oral Given 06/25/22 1353)    ED Course/ Medical Decision Making/ A&P                             Medical Decision Making Amount and/or Complexity of Data Reviewed Labs: ordered. Radiology: ordered.  Risk Prescription drug management.    37 year old male presenting to the emergency department with neck pain radiating down his right arm.  The patient states that the pain has been ongoing for the past 4 days.  He denies any falls or trauma.  Been  put on light duty at work.  He endorses tingling down his arms.  Denies any numbness or weakness.  Denies any urinary or fecal incontinence.  Denies any fevers or chills.  He has been trying Tylenol and lidocaine patches at home with some relief with the lidocaine patches.  He is requesting a muscle relaxant due to muscle spasms.  On arrival, the patient was afebrile, hemodynamically stable, BP 152/91, saturating 100% on room air.  Physical exam significant for intact  strength and sensation in the bilateral upper extremities.  No midline tenderness to palpation of the cervical spine and no recent falls or trauma.  No red flag symptoms to suggest acute spinal cord compression requiring emergent imaging.  No urinary or fecal incontinence.  No fevers or chills.  Patient presenting with pain in the right trapezius with associated spasm, some radicular symptoms although negative Spurling sign.  He is neurologically intact with 5 out of 5 strength in the bilateral upper extremities, no sensory deficit.  Initially some pain with ROM. CRP mildly elevated and ESR elevated, however, the patient can range his shoulder without difficulty. Low suspicion for septic arthritis at this time.   XR of the right shoulder was unremarkable.  Return precautions provided in the event of worsening symptoms that would indicate septic arthritis.  Patient area of tenderness is located over his right trapezius.   No midline tenderness of the cervical spine.  Without evidence of trauma, I do not think emergent imaging is warranted at this time.  No red flag symptoms to suggest epidural abscess or cauda equina syndrome.  Range of motion of the neck is intact, low suspicion for meningitis or encephalitis.  Will treat with a course of Norco as the patient allergic to NSAIDs, lidocaine patch and Flexeril for muscle spasm. [place] PDMP reviewed. Advised continued Tylenol as well. The patient is overall well appearing, afebrile, non-toxic, with intact  ROM of the neck and shoulder on repeat assessment.  Outpatient follow-up recommended.  Strict return precautions advised in the event he has worsening symptoms.   Final Clinical Impression(s) / ED Diagnoses Final diagnoses:  Neck pain  Muscle spasm  Upper back pain on right side  Paresthesia    Rx / DC Orders ED Discharge Orders          Ordered    HYDROcodone-acetaminophen (NORCO/VICODIN) 5-325 MG tablet  Every 6 hours PRN        06/25/22 1340    cyclobenzaprine (FLEXERIL) 10 MG tablet  2 times daily PRN        06/25/22 1340    lidocaine (LIDODERM) 5 %  Every 24 hours        06/25/22 1340              Regan Lemming, MD 06/25/22 1341    Regan Lemming, MD 06/25/22 1857

## 2022-06-26 ENCOUNTER — Ambulatory Visit (INDEPENDENT_AMBULATORY_CARE_PROVIDER_SITE_OTHER)
Admission: RE | Admit: 2022-06-26 | Discharge: 2022-06-26 | Disposition: A | Discharge status: Home / Self Care | Payer: Medicaid Other | Source: Ambulatory Visit | Attending: Surgery | Admitting: Surgery

## 2022-06-26 ENCOUNTER — Ambulatory Visit (HOSPITAL_COMMUNITY)
Admission: RE | Admit: 2022-06-26 | Discharge: 2022-06-26 | Disposition: A | Discharge status: Home / Self Care | Payer: Medicaid Other | Source: Ambulatory Visit | Attending: Surgery | Admitting: Surgery

## 2022-06-26 ENCOUNTER — Encounter: Payer: Medicaid Other | Admitting: Surgery

## 2022-06-26 ENCOUNTER — Ambulatory Visit (INDEPENDENT_AMBULATORY_CARE_PROVIDER_SITE_OTHER): Payer: Medicaid Other | Admitting: Surgery

## 2022-06-26 ENCOUNTER — Encounter: Payer: Self-pay | Admitting: Surgery

## 2022-06-26 VITALS — BP 168/70 | HR 87 | Temp 98.0°F | Resp 20 | Ht 65.0 in | Wt 236.0 lb

## 2022-06-26 DIAGNOSIS — N186 End stage renal disease: Secondary | ICD-10-CM | POA: Insufficient documentation

## 2022-06-26 DIAGNOSIS — Z992 Dependence on renal dialysis: Secondary | ICD-10-CM | POA: Diagnosis not present

## 2022-06-26 NOTE — H&P (View-Only) (Signed)
[facility] Specialist of [facility]  Patient name: William Gregory MRN: [id number] DOB: [birth year] Sex: male   REQUESTING PROVIDER:    Dr. Joelyn Oms   REASON FOR CONSULT:    Dialysis access  HISTORY OF PRESENT ILLNESS:   William Gregory is a 37 y.o. male, who is referred for evaluation of renal access.  The patient is now on dialysis Tuesday Thursday Saturday via a catheter.  He is right-handed.  He does not have a pacemaker or defibrillator.  Patient takes medication for asthma.  He has a psychiatric disorder.  He is medically managed for hypertension.  He is a current smoker.  PAST MEDICAL HISTORY    Past Medical History:  Diagnosis Date   Alcohol abuse    Asthma    Back pain    Depression    Knee joint pain, left    Neck pain    Psychiatric problem    unspecified by patient (receives "injections" bi-weekly)   Renal disorder    Tobacco abuse      FAMILY HISTORY   Family History  Problem Relation Age of Onset   Diabetes Mother    Diabetes Father    CVA Father    Dementia Maternal Grandmother    Kidney disease Maternal Grandmother    Heart attack Neg Hx     SOCIAL HISTORY:   Social History   Socioeconomic History   Marital status: Single    Spouse name: Not on file   Number of children: Not on file   Years of education: Not on file   Highest education level: Not on file  Occupational History   Not on file  Tobacco Use   Smoking status: Every Day    Packs/day: 1.00    Types: Cigarettes   Smokeless tobacco: Never  Vaping Use   Vaping Use: Former  Substance and Sexual Activity   Alcohol use: Not Currently    Comment: sober x "months"   Drug use: Yes    Types: Marijuana   Sexual activity: Yes  Other Topics Concern   Not on file  Social History Narrative   Right handed   Unemployed/disability   Ninth grade level of education   Social Determinants of Health   Financial Resource Strain: Not on  file  Food Insecurity: No Food Insecurity (03/30/2022)   Hunger Vital Sign    Worried About Running Out of Food in the Last Year: Never true    Ran Out of Food in the Last Year: Never true  Recent Concern: Food Insecurity - Food Insecurity Present (03/20/2022)   Hunger Vital Sign    Worried About Running Out of Food in the Last Year: Sometimes true    Ran Out of Food in the Last Year: Patient refused  Transportation Needs: No Transportation Needs (03/30/2022)   PRAPARE - Hydrologist (Medical): No    Lack of Transportation (Non-Medical): No  Physical Activity: Not on file  Stress: Not on file  Social Connections: Not on file  Intimate Partner Violence: Unknown (03/20/2022)   Humiliation, Afraid, Rape, and Kick questionnaire    Fear of Current or Ex-Partner: Patient refused    Emotionally Abused: Not on file    Physically Abused: Not on file    Sexually Abused: Not on file    ALLERGIES:    Allergies  Allergen Reactions   Aspirin Swelling   Ibuprofen Rash    CURRENT MEDICATIONS:    Current Outpatient Medications  Medication Sig Dispense Refill   acetaminophen (TYLENOL) 500 MG tablet Take 500 mg by mouth every 6 (six) hours as needed for moderate pain.     albuterol (VENTOLIN HFA) 108 (90 Base) MCG/ACT inhaler Inhale 2 puffs into the lungs every 4 (four) hours as needed for wheezing or shortness of breath. 18 g 0   amLODipine (NORVASC) 5 MG tablet Take 1 tablet (5 mg total) by mouth daily. 90 tablet 1   citalopram (CELEXA) 20 MG tablet Take 20 mg by mouth every morning.     cyclobenzaprine (FLEXERIL) 10 MG tablet Take 1 tablet (10 mg total) by mouth 2 (two) times daily as needed for muscle spasms. 20 tablet 0   HYDROcodone-acetaminophen (NORCO/VICODIN) 5-325 MG tablet Take 1-2 tablets by mouth every 6 (six) hours as needed. 10 tablet 0   lidocaine (LIDODERM) 5 % Place 1 patch onto the skin daily. Remove & Discard patch within 12 hours or as  directed by MD 30 patch 0   paliperidone Palmitate ER (INVEGA TRINZA) 819 MG/2.63ML injection Inject 819 mg into the muscle every 3 (three) months.     No current facility-administered medications for this visit.    REVIEW OF SYSTEMS:   [X]$  denotes positive finding, [ ]$  denotes negative finding Cardiac  Comments:  Chest pain or chest pressure:    Shortness of breath upon exertion:    Short of breath when lying flat:    Irregular heart rhythm:        Vascular    Pain in calf, thigh, or hip brought on by ambulation:    Pain in feet at night that wakes you up from your sleep:     Blood clot in your veins:    Leg swelling:         Pulmonary    Oxygen at home:    Productive cough:     Wheezing:         Neurologic    Sudden weakness in arms or legs:     Sudden numbness in arms or legs:     Sudden onset of difficulty speaking or slurred speech:    Temporary loss of vision in one eye:     Problems with dizziness:         Gastrointestinal    Blood in stool:      Vomited blood:         Genitourinary    Burning when urinating:     Blood in urine:        Psychiatric    Major depression:         Hematologic    Bleeding problems:    Problems with blood clotting too easily:        Skin    Rashes or ulcers:        Constitutional    Fever or chills:     PHYSICAL EXAM:   Vitals:   06/26/22 1208  BP: (!) 168/70  Pulse: 87  Resp: 20  Temp: 98 F (36.7 C)  SpO2: 95%  Weight: 236 lb (107 kg)  Height: 5' 5"$  (1.651 m)    GENERAL: The patient is a well-nourished male, in no acute distress. The vital signs are documented above. CARDIAC: There is a regular rate and rhythm.  VASCULAR: Palpable radial pulses PULMONARY: Nonlabored respirations MUSCULOSKELETAL: There are no major deformities or cyanosis. NEUROLOGIC: No focal weakness or paresthesias are detected. SKIN: There are no ulcers or rashes noted.   STUDIES:   Reviewed  the  following: +-----------------+-------------+----------+---------+  Right Cephalic   Diameter (cm)Depth (cm)Findings   +-----------------+-------------+----------+---------+  Shoulder            0.54                          +-----------------+-------------+----------+---------+  Prox upper arm       0.54               branching  +-----------------+-------------+----------+---------+  Mid upper arm        0.58                          +-----------------+-------------+----------+---------+  Dist upper arm       0.61                          +-----------------+-------------+----------+---------+  Antecubital fossa    0.78                          +-----------------+-------------+----------+---------+  Prox forearm         0.44                          +-----------------+-------------+----------+---------+  Mid forearm          0.44                          +-----------------+-------------+----------+---------+  Dist forearm         0.35                          +-----------------+-------------+----------+---------+   +-----------------+-------------+----------+--------------+  Right Basilic    Diameter (cm)Depth (cm)   Findings     +-----------------+-------------+----------+--------------+  Shoulder                               not visualized  +-----------------+-------------+----------+--------------+  Prox upper arm                          not visualized  +-----------------+-------------+----------+--------------+  Mid upper arm        0.38                               +-----------------+-------------+----------+--------------+  Dist upper arm       0.42                               +-----------------+-------------+----------+--------------+  Antecubital fossa    0.32                               +-----------------+-------------+----------+--------------+  Prox forearm         0.27                                +-----------------+-------------+----------+--------------+   +-----------------+-------------+----------+---------+  Left Cephalic    Diameter (cm)Depth (cm)Findings   +-----------------+-------------+----------+---------+  Shoulder            0.55                          +-----------------+-------------+----------+---------+  Prox upper arm       0.50               branching  +-----------------+-------------+----------+---------+  Mid upper arm        0.44                          +-----------------+-------------+----------+---------+  Dist upper arm       0.46                          +-----------------+-------------+----------+---------+  Antecubital fossa    0.53                          +-----------------+-------------+----------+---------+  Prox forearm         0.36                          +-----------------+-------------+----------+---------+  Mid forearm          0.31               branching  +-----------------+-------------+----------+---------+  Dist forearm         0.35                          +-----------------+-------------+----------+---------+   +-----------------+-------------+----------+---------+  Left Basilic     Diameter (cm)Depth (cm)Findings   +-----------------+-------------+----------+---------+  Prox upper arm       0.47                          +-----------------+-------------+----------+---------+  Mid upper arm        0.46                          +-----------------+-------------+----------+---------+  Dist upper arm       0.40               branching  +-----------------+-------------+----------+---------+  Antecubital fossa 0.41 / 0.33                      +-----------------+-------------+----------+---------+  Prox forearm         0.24                          +-----------------+-------------+----------+---------+   Arterial waveforms are triphasic ASSESSMENT  and PLAN   End-stage renal disease: After reviewing his vein mapping, I think he would be a good candidate for a left brachiocephalic fistula.  I discussed the risks of the procedure including the risk of steal as well as not maturity.  We also discussed the possibility of elevation.  I will get this scheduled in the near future.  All questions were answered.   Leia Alf, MD, FACS [facility] [phone] Pager [phone]

## 2022-06-26 NOTE — Progress Notes (Signed)
Vascular and Vein Specialist of [facility]  Patient name: William Gregory MRN: [id number] DOB: [birth year] Sex: male   REQUESTING PROVIDER:    Dr. Joelyn Oms   REASON FOR CONSULT:    Dialysis access  HISTORY OF PRESENT ILLNESS:   William Gregory is a 37 y.o. male, who is referred for evaluation of renal access.  The patient is now on dialysis Tuesday Thursday Saturday via a catheter.  He is right-handed.  He does not have a pacemaker or defibrillator.  Patient takes medication for asthma.  He has a psychiatric disorder.  He is medically managed for hypertension.  He is a current smoker.  PAST MEDICAL HISTORY    Past Medical History:  Diagnosis Date   Alcohol abuse    Asthma    Back pain    Depression    Knee joint pain, left    Neck pain    Psychiatric problem    unspecified by patient (receives "injections" bi-weekly)   Renal disorder    Tobacco abuse      FAMILY HISTORY   Family History  Problem Relation Age of Onset   Diabetes Mother    Diabetes Father    CVA Father    Dementia Maternal Grandmother    Kidney disease Maternal Grandmother    Heart attack Neg Hx     SOCIAL HISTORY:   Social History   Socioeconomic History   Marital status: Single    Spouse name: Not on file   Number of children: Not on file   Years of education: Not on file   Highest education level: Not on file  Occupational History   Not on file  Tobacco Use   Smoking status: Every Day    Packs/day: 1.00    Types: Cigarettes   Smokeless tobacco: Never  Vaping Use   Vaping Use: Former  Substance and Sexual Activity   Alcohol use: Not Currently    Comment: sober x "months"   Drug use: Yes    Types: Marijuana   Sexual activity: Yes  Other Topics Concern   Not on file  Social History Narrative   Right handed   Unemployed/disability   Ninth grade level of education   Social Determinants of Health   Financial Resource Strain: Not on  file  Food Insecurity: No Food Insecurity (03/30/2022)   Hunger Vital Sign    Worried About Running Out of Food in the Last Year: Never true    Ran Out of Food in the Last Year: Never true  Recent Concern: Food Insecurity - Food Insecurity Present (03/20/2022)   Hunger Vital Sign    Worried About Running Out of Food in the Last Year: Sometimes true    Ran Out of Food in the Last Year: Patient refused  Transportation Needs: No Transportation Needs (03/30/2022)   PRAPARE - Hydrologist (Medical): No    Lack of Transportation (Non-Medical): No  Physical Activity: Not on file  Stress: Not on file  Social Connections: Not on file  Intimate Partner Violence: Unknown (03/20/2022)   Humiliation, Afraid, Rape, and Kick questionnaire    Fear of Current or Ex-Partner: Patient refused    Emotionally Abused: Not on file    Physically Abused: Not on file    Sexually Abused: Not on file    ALLERGIES:    Allergies  Allergen Reactions   Aspirin Swelling   Ibuprofen Rash    CURRENT MEDICATIONS:    Current Outpatient Medications  Medication Sig Dispense Refill   acetaminophen (TYLENOL) 500 MG tablet Take 500 mg by mouth every 6 (six) hours as needed for moderate pain.     albuterol (VENTOLIN HFA) 108 (90 Base) MCG/ACT inhaler Inhale 2 puffs into the lungs every 4 (four) hours as needed for wheezing or shortness of breath. 18 g 0   amLODipine (NORVASC) 5 MG tablet Take 1 tablet (5 mg total) by mouth daily. 90 tablet 1   citalopram (CELEXA) 20 MG tablet Take 20 mg by mouth every morning.     cyclobenzaprine (FLEXERIL) 10 MG tablet Take 1 tablet (10 mg total) by mouth 2 (two) times daily as needed for muscle spasms. 20 tablet 0   HYDROcodone-acetaminophen (NORCO/VICODIN) 5-325 MG tablet Take 1-2 tablets by mouth every 6 (six) hours as needed. 10 tablet 0   lidocaine (LIDODERM) 5 % Place 1 patch onto the skin daily. Remove & Discard patch within 12 hours or as  directed by MD 30 patch 0   paliperidone Palmitate ER (INVEGA TRINZA) 819 MG/2.63ML injection Inject 819 mg into the muscle every 3 (three) months.     No current facility-administered medications for this visit.    REVIEW OF SYSTEMS:   [X]$  denotes positive finding, [ ]$  denotes negative finding Cardiac  Comments:  Chest pain or chest pressure:    Shortness of breath upon exertion:    Short of breath when lying flat:    Irregular heart rhythm:        Vascular    Pain in calf, thigh, or hip brought on by ambulation:    Pain in feet at night that wakes you up from your sleep:     Blood clot in your veins:    Leg swelling:         Pulmonary    Oxygen at home:    Productive cough:     Wheezing:         Neurologic    Sudden weakness in arms or legs:     Sudden numbness in arms or legs:     Sudden onset of difficulty speaking or slurred speech:    Temporary loss of vision in one eye:     Problems with dizziness:         Gastrointestinal    Blood in stool:      Vomited blood:         Genitourinary    Burning when urinating:     Blood in urine:        Psychiatric    Major depression:         Hematologic    Bleeding problems:    Problems with blood clotting too easily:        Skin    Rashes or ulcers:        Constitutional    Fever or chills:     PHYSICAL EXAM:   Vitals:   06/26/22 1208  BP: (!) 168/70  Pulse: 87  Resp: 20  Temp: 98 F (36.7 C)  SpO2: 95%  Weight: 236 lb (107 kg)  Height: 5' 5"$  (1.651 m)    GENERAL: The patient is a well-nourished male, in no acute distress. The vital signs are documented above. CARDIAC: There is a regular rate and rhythm.  VASCULAR: Palpable radial pulses PULMONARY: Nonlabored respirations MUSCULOSKELETAL: There are no major deformities or cyanosis. NEUROLOGIC: No focal weakness or paresthesias are detected. SKIN: There are no ulcers or rashes noted.   STUDIES:   Reviewed  the  following: +-----------------+-------------+----------+---------+  Right Cephalic   Diameter (cm)Depth (cm)Findings   +-----------------+-------------+----------+---------+  Shoulder            0.54                          +-----------------+-------------+----------+---------+  Prox upper arm       0.54               branching  +-----------------+-------------+----------+---------+  Mid upper arm        0.58                          +-----------------+-------------+----------+---------+  Dist upper arm       0.61                          +-----------------+-------------+----------+---------+  Antecubital fossa    0.78                          +-----------------+-------------+----------+---------+  Prox forearm         0.44                          +-----------------+-------------+----------+---------+  Mid forearm          0.44                          +-----------------+-------------+----------+---------+  Dist forearm         0.35                          +-----------------+-------------+----------+---------+   +-----------------+-------------+----------+--------------+  Right Basilic    Diameter (cm)Depth (cm)   Findings     +-----------------+-------------+----------+--------------+  Shoulder                               not visualized  +-----------------+-------------+----------+--------------+  Prox upper arm                          not visualized  +-----------------+-------------+----------+--------------+  Mid upper arm        0.38                               +-----------------+-------------+----------+--------------+  Dist upper arm       0.42                               +-----------------+-------------+----------+--------------+  Antecubital fossa    0.32                               +-----------------+-------------+----------+--------------+  Prox forearm         0.27                                +-----------------+-------------+----------+--------------+   +-----------------+-------------+----------+---------+  Left Cephalic    Diameter (cm)Depth (cm)Findings   +-----------------+-------------+----------+---------+  Shoulder            0.55                          +-----------------+-------------+----------+---------+  Prox upper arm       0.50               branching  +-----------------+-------------+----------+---------+  Mid upper arm        0.44                          +-----------------+-------------+----------+---------+  Dist upper arm       0.46                          +-----------------+-------------+----------+---------+  Antecubital fossa    0.53                          +-----------------+-------------+----------+---------+  Prox forearm         0.36                          +-----------------+-------------+----------+---------+  Mid forearm          0.31               branching  +-----------------+-------------+----------+---------+  Dist forearm         0.35                          +-----------------+-------------+----------+---------+   +-----------------+-------------+----------+---------+  Left Basilic     Diameter (cm)Depth (cm)Findings   +-----------------+-------------+----------+---------+  Prox upper arm       0.47                          +-----------------+-------------+----------+---------+  Mid upper arm        0.46                          +-----------------+-------------+----------+---------+  Dist upper arm       0.40               branching  +-----------------+-------------+----------+---------+  Antecubital fossa 0.41 / 0.33                      +-----------------+-------------+----------+---------+  Prox forearm         0.24                          +-----------------+-------------+----------+---------+   Arterial waveforms are triphasic ASSESSMENT  and PLAN   End-stage renal disease: After reviewing his vein mapping, I think he would be a good candidate for a left brachiocephalic fistula.  I discussed the risks of the procedure including the risk of steal as well as not maturity.  We also discussed the possibility of elevation.  I will get this scheduled in the near future.  All questions were answered.   Leia Alf, MD, FACS [facility] [phone] Pager [phone]

## 2022-06-27 ENCOUNTER — Telehealth: Payer: Self-pay

## 2022-06-27 NOTE — Telephone Encounter (Signed)
Transition Care Management Follow-up Telephone Call Date of discharge and from where: /21/24 How have you been since you were released from the hospital? Per pt he is feeling better. Any questions or concerns? No  Items Reviewed: Did the pt receive and understand the discharge instructions provided? Yes  Medications obtained and verified? Yes  Other? No  Any new allergies since your discharge? No  Dietary orders reviewed? No Do you have support at home? Yes    Follow up appointments reviewed:  PCP Hospital f/u appt confirmed? Yes  Scheduled to see Fola  on 07/05/22 @ 2:40pm. [facility] f/u appt confirmed? yes Scheduled to see cardiology  Are transportation arrangements needed? No  If their condition worsens, is the pt aware to call PCP or go to the Emergency Dept.? Yes Was the patient provided with contact information for the PCP's office or ED? Yes Was to pt encouraged to call back with questions or concerns? Yes   Elyse Jarvis RMA

## 2022-06-30 ENCOUNTER — Other Ambulatory Visit: Payer: Self-pay

## 2022-06-30 DIAGNOSIS — N186 End stage renal disease: Secondary | ICD-10-CM

## 2022-07-05 ENCOUNTER — Inpatient Hospital Stay: Payer: Self-pay | Admitting: Nurse Practitioner

## 2022-07-07 ENCOUNTER — Inpatient Hospital Stay: Payer: Self-pay | Admitting: Nurse Practitioner

## 2022-07-10 ENCOUNTER — Ambulatory Visit (INDEPENDENT_AMBULATORY_CARE_PROVIDER_SITE_OTHER): Payer: Medicaid Other | Admitting: Nurse Practitioner

## 2022-07-10 ENCOUNTER — Encounter: Payer: Self-pay | Admitting: Nurse Practitioner

## 2022-07-10 VITALS — BP 130/91 | HR 80 | Temp 97.5°F

## 2022-07-10 DIAGNOSIS — F321 Major depressive disorder, single episode, moderate: Secondary | ICD-10-CM

## 2022-07-10 DIAGNOSIS — I10 Essential (primary) hypertension: Secondary | ICD-10-CM | POA: Diagnosis not present

## 2022-07-10 DIAGNOSIS — R0981 Nasal congestion: Secondary | ICD-10-CM

## 2022-07-10 DIAGNOSIS — Z720 Tobacco use: Secondary | ICD-10-CM

## 2022-07-10 DIAGNOSIS — M549 Dorsalgia, unspecified: Secondary | ICD-10-CM | POA: Diagnosis not present

## 2022-07-10 DIAGNOSIS — R051 Acute cough: Secondary | ICD-10-CM | POA: Insufficient documentation

## 2022-07-10 LAB — POCT INFLUENZA A/B
Influenza A, POC: NEGATIVE
Influenza B, POC: NEGATIVE

## 2022-07-10 LAB — POC COVID19 BINAXNOW: SARS Coronavirus 2 Ag: NEGATIVE

## 2022-07-10 MED ORDER — CYCLOBENZAPRINE HCL 5 MG PO TABS: 5.0000 mg | ORAL_TABLET | Freq: Three times a day (TID) | ORAL | 0 refills | Status: DC | PRN

## 2022-07-10 MED ORDER — BENZONATATE 100 MG PO CAPS: 100.0000 mg | ORAL_CAPSULE | Freq: Three times a day (TID) | ORAL | 0 refills | Status: DC | PRN

## 2022-07-10 MED ORDER — FLUTICASONE PROPIONATE 50 MCG/ACT NA SUSP: 2.0000 | Freq: Every day | NASAL | 0 refills | Status: DC

## 2022-07-10 NOTE — Assessment & Plan Note (Signed)
Follows up with psychiatrist  Currently on celexa 20mg  daily  Denies SI, HI. Patient was encouraged to maintain close follow up with psychiatrist.

## 2022-07-10 NOTE — Assessment & Plan Note (Signed)
Flonase nasal spray 2 spray into both nostrils daily for nasal congestion ordered

## 2022-07-10 NOTE — Progress Notes (Addendum)
Established Patient Office Visit  Subjective:  Patient ID: William Gregory, male    DOB: Nov 19, 1985  Age: 37 y.o. MRN: [id number]  CC:  Chief Complaint  Patient presents with   Follow-up    F/u from hospital pt report having cough nasal congestion.    HPI William Gregory is a 37 y.o. 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  He has been using lidocaine patch and taking muscle relaxant as needed.  He reports that his pain is much improved.  He denies fever, chills, malaise, numbness, tingling.   Patient complains of acute cough, stuffy nose since the past 2 days.  No known sick contacts reported.  He has been taking OTC cough medication as needed, smokes 5 cigarettes daily.   Has upcoming surgery for left arm AV fistula creation    Past Medical History:  Diagnosis Date   Alcohol abuse    Asthma    Back pain    Depression    Knee joint pain, left    Neck pain    Psychiatric problem    unspecified by patient (receives "injections" bi-weekly)   Renal disorder    Tobacco abuse     Past Surgical History:  Procedure Laterality Date   arm surgery     IR FLUORO GUIDE CV LINE RIGHT  03/31/2022   IR PERC TUN PERIT CATH WO PORT S&I /IMAG  03/31/2022   IR US GUIDE VASC ACCESS RIGHT  03/31/2022    Family History  Problem Relation Age of Onset   Diabetes Mother    Diabetes Father    CVA Father    Dementia Maternal Grandmother    Kidney disease Maternal Grandmother    Heart attack Neg Hx     Social History   Socioeconomic History   Marital status: Single    Spouse name: Not on file   Number of children: Not on file   Years of education: Not on file   Highest education level: Not on file  Occupational History   Not on file   Tobacco Use   Smoking status: Every Day    Packs/day: 1.00    Types: Cigarettes   Smokeless tobacco: Never  Vaping Use   Vaping Use: Former  Substance and Sexual Activity   Alcohol use: Not Currently    Comment: sober x "months"   Drug use: Yes    Types: Marijuana   Sexual activity: Yes  Other Topics Concern   Not on file  Social History Narrative   Right handed   Unemployed/disability   Ninth grade level of education   Social Determinants of Health   Financial Resource Strain: Not on file  Food Insecurity: No Food Insecurity (03/30/2022)   Hunger Vital Sign    Worried About Running Out of Food in the Last Year: Never true    Ran Out of Food in the Last Year: Never true  Recent Concern: Food Insecurity - Food Insecurity Present (03/20/2022)   Hunger Vital Sign    Worried About Running Out of Food in the Last Year: Sometimes true    Ran Out of Food in the Last Year: Patient refused  Transportation Needs: No Transportation Needs (03/30/2022)   PRAPARE - Transportation    Lack of Transportation (Medical): No    Lack  of Transportation (Non-Medical): No  Physical Activity: Not on file  Stress: Not on file  Social Connections: Not on file  Intimate Partner Violence: Unknown (03/20/2022)   Humiliation, Afraid, Rape, and Kick questionnaire    Fear of Current or Ex-Partner: Patient refused    Emotionally Abused: Not on file    Physically Abused: Not on file    Sexually Abused: Not on file    Outpatient Medications Prior to Visit  Medication Sig Dispense Refill   acetaminophen (TYLENOL) 500 MG tablet Take 500 mg by mouth every 6 (six) hours as needed for moderate pain.     albuterol (VENTOLIN HFA) 108 (90 Base) MCG/ACT inhaler Inhale 2 puffs into the lungs every 4 (four) hours as needed for wheezing or shortness of breath. 18 g 0   amLODipine (NORVASC) 5 MG tablet Take 1 tablet (5 mg total) by mouth daily. 90 tablet 1   citalopram (CELEXA) 20 MG tablet Take 20 mg by mouth  every morning.     lidocaine (LIDODERM) 5 % Place 1 patch onto the skin daily. Remove & Discard patch within 12 hours or as directed by MD 30 patch 0   paliperidone Palmitate ER (INVEGA TRINZA) 819 MG/2.63ML injection Inject 819 mg into the muscle every 3 (three) months.     HYDROcodone-acetaminophen (NORCO/VICODIN) 5-325 MG tablet Take 1-2 tablets by mouth every 6 (six) hours as needed. 10 tablet 0   cyclobenzaprine (FLEXERIL) 10 MG tablet Take 1 tablet (10 mg total) by mouth 2 (two) times daily as needed for muscle spasms. 20 tablet 0   No facility-administered medications prior to visit.    Allergies  Allergen Reactions   Aspirin Swelling   Ibuprofen Rash    ROS Review of Systems  Constitutional:  Negative for activity change, appetite change, chills, diaphoresis, fatigue and fever.  HENT:  Positive for congestion. Negative for dental problem, drooling, ear discharge, ear pain, facial swelling and hearing loss.   Respiratory:  Positive for cough. Negative for choking, chest tightness, shortness of breath, wheezing and stridor.   Cardiovascular: Negative.  Negative for chest pain, palpitations and leg swelling.  Gastrointestinal: Negative.  Negative for abdominal distention, abdominal pain and anal bleeding.  Musculoskeletal:  Positive for back pain and myalgias.  Skin: Negative.   Neurological:  Negative for dizziness, seizures, facial asymmetry, light-headedness, numbness and headaches.  Psychiatric/Behavioral:  Negative for agitation, behavioral problems, confusion, decreased concentration, self-injury and suicidal ideas.       Objective:    Physical Exam Constitutional:      General: He is not in acute distress.    Appearance: He is obese. He is not ill-appearing, toxic-appearing or diaphoretic.  Eyes:     General: No scleral icterus.       Right eye: No discharge.        Left eye: No discharge.     Extraocular Movements: Extraocular movements intact.  Cardiovascular:      Rate and Rhythm: Normal rate and regular rhythm.     Pulses: Normal pulses.     Heart sounds: Normal heart sounds. No murmur heard.    No friction rub. No gallop.  Pulmonary:     Effort: Pulmonary effort is normal. No respiratory distress.     Breath sounds: Normal breath sounds. No stridor. No wheezing, rhonchi or rales.  Chest:     Chest wall: No tenderness.  Abdominal:     General: There is no distension.     Palpations: Abdomen is soft.  Tenderness: There is no abdominal tenderness.  Musculoskeletal:        General: Tenderness present.     Cervical back: Normal range of motion.     Right lower leg: No edema.     Left lower leg: No edema.     Comments: Tenderness on palpation of right upper back, able to do ROM of the shoulders, skin warm and dry , has palpable radial pulses.   Skin:    General: Skin is warm and dry.     Capillary Refill: Capillary refill takes less than 2 seconds.  Neurological:     Mental Status: He is alert and oriented to person, place, and time.     Cranial Nerves: No cranial nerve deficit.     Sensory: No sensory deficit.     Motor: No weakness.     Coordination: Coordination normal.     Gait: Gait normal.  Psychiatric:        Mood and Affect: Mood normal.        Behavior: Behavior normal.        Thought Content: Thought content normal.        Judgment: Judgment normal.     BP (!) 130/91   Pulse 80   Temp (!) 97.5 F (36.4 C)   SpO2 100%  Wt Readings from Last 3 Encounters:  06/26/22 236 lb (107 kg)  05/12/22 246 lb 6.4 oz (111.8 kg)  04/03/22 250 lb (113.4 kg)    Lab Results  Component Value Date   TSH 3.36 03/20/2022   Lab Results  Component Value Date   WBC 10.7 (H) 06/25/2022   HGB 10.2 (L) 06/25/2022   HCT 31.0 (L) 06/25/2022   MCV 92.3 06/25/2022   PLT 239 06/25/2022   Lab Results  Component Value Date   NA 135 06/25/2022   K 3.7 06/25/2022   CO2 22 06/25/2022   GLUCOSE 92 06/25/2022   BUN 45 (H) 06/25/2022    CREATININE 9.36 (H) 06/25/2022   BILITOT 0.6 03/31/2022   ALKPHOS 79 03/31/2022   AST 16 03/31/2022   ALT 14 03/31/2022   PROT 7.5 03/31/2022   ALBUMIN 3.5 04/04/2022   CALCIUM 9.1 06/25/2022   ANIONGAP 12 06/25/2022   EGFR 6 (L) 05/12/2022   GFR 6.36 (LL) 03/20/2022   Lab Results  Component Value Date   CHOL 144 04/02/2008   Lab Results  Component Value Date   HDL 49 04/02/2008   Lab Results  Component Value Date   LDLCALC 83 04/02/2008   Lab Results  Component Value Date   TRIG 62 04/02/2008   Lab Results  Component Value Date   CHOLHDL 2.9 Ratio 04/02/2008   Lab Results  Component Value Date   HGBA1C 5.6 03/29/2022      Assessment & Plan:   Problem List Items Addressed This Visit       Cardiovascular and Mediastinum   Essential hypertension    BP Readings from Last 3 Encounters:  07/10/22 (!) 130/91  06/26/22 (!) 168/70  06/25/22 (!) 509/32  Diastolic blood pressure slightly elevated, patient refused blood pressure recheck.  Continue amlodipine 5 mg daily. DASH diet advised engage in regular moderate exercises at least 250 minutes weekly.  He was encouraged to attend dialysis regularly        Other   Tobacco abuse    Smokes 5 cigarettes daily need to quit smoking discussed with the patient he verbalized understanding      Depression  Follows up with psychiatrist  Currently on celexa 20mg  daily  Denies SI, HI. Patient was encouraged to maintain close follow up with psychiatrist.        Upper back pain on right side - Primary    Continue lidocaine patch every 24 hours Flexeril 5 mg 3 times daily as needed ordered       Relevant Medications   cyclobenzaprine (FLEXERIL) 5 MG tablet   Nasal congestion    Flonase nasal spray 2 spray into both nostrils daily for nasal congestion ordered      Acute cough    Negative for flu and COVID Tessalon 100 mg 3 times daily as needed cough ordered.      Relevant Medications   benzonatate  (TESSALON PERLES) 100 MG capsule    Meds ordered this encounter  Medications   cyclobenzaprine (FLEXERIL) 5 MG tablet    Sig: Take 1 tablet (5 mg total) by mouth 3 (three) times daily as needed for muscle spasms.    Dispense:  30 tablet    Refill:  0   benzonatate (TESSALON PERLES) 100 MG capsule    Sig: Take 1 capsule (100 mg total) by mouth 3 (three) times daily as needed for cough.    Dispense:  30 capsule    Refill:  0   fluticasone (FLONASE) 50 MCG/ACT nasal spray    Sig: Place 2 sprays into both nostrils daily.    Dispense:  16 g    Refill:  0    Follow-up: No follow-ups on file.    Renee Rival, FNP

## 2022-07-10 NOTE — Assessment & Plan Note (Signed)
BP Readings from Last 3 Encounters:  07/10/22 (!) 130/91  06/26/22 (!) 168/70  06/25/22 (!) 361/44  Diastolic blood pressure slightly elevated, patient refused blood pressure recheck.  Continue amlodipine 5 mg daily. DASH diet advised engage in regular moderate exercises at least 250 minutes weekly.  He was encouraged to attend dialysis regularly

## 2022-07-10 NOTE — Assessment & Plan Note (Signed)
Continue lidocaine patch every 24 hours Flexeril 5 mg 3 times daily as needed ordered

## 2022-07-10 NOTE — Assessment & Plan Note (Signed)
Negative for flu and COVID Tessalon 100 mg 3 times daily as needed cough ordered.

## 2022-07-10 NOTE — Addendum Note (Signed)
Addended byVonzell Schlatter on: 07/10/2022 01:49 PM   Modules accepted: Orders

## 2022-07-10 NOTE — Patient Instructions (Signed)
Upper back pain on right side  - cyclobenzaprine (FLEXERIL) 5 MG tablet; Take 1 tablet (5 mg total) by mouth 3 (three) times daily as needed for muscle spasms.  Dispense: 30 tablet; Refill: 0  Acute cough  - benzonatate (TESSALON PERLES) 100 MG capsule; Take 1 capsule (100 mg total) by mouth 3 (three) times daily as needed for cough.  Dispense: 30 capsule; Refill: 0  Nasal congestion  Flonase nasal spray 2 spray into both nostril daily.    It is important that you exercise regularly at least 30 minutes 5 times a week as tolerated  Think about what you will eat, plan ahead. Choose " clean, green, fresh or frozen" over canned, processed or packaged foods which are more sugary, salty and fatty. 70 to 75% of food eaten should be vegetables and fruit. Three meals at set times with snacks allowed between meals, but they must be fruit or vegetables. Aim to eat over a 12 hour period , example 7 am to 7 pm, and STOP after  your last meal of the day. Drink water,generally about 64 ounces per day, no other drink is as healthy. Fruit juice is best enjoyed in a healthy way, by EATING the fruit.  Thanks for choosing [facility] we consider it a privelige to serve you.

## 2022-07-10 NOTE — Assessment & Plan Note (Signed)
Smokes 5 cigarettes daily need to quit smoking discussed with the patient he verbalized understanding

## 2022-07-11 NOTE — Progress Notes (Deleted)
Assessment/Plan:   1.  Tardive dyskinesia due to [facility]  -***We previously started him on Ingrezza, 40 mg daily for 1 week and then 80 mg thereafter.  We sent and information to the specialty pharmacy and he never reached back out to them once they tried to contact him multiple times.  Discussed with him that this comes from a specialty pharmacy and he needs to follow-up.   Subjective:   William Gregory was seen today in follow up for tardive dyskinesia due to [place].  He was seen in October, 2023.  We started him on Ingrezza.  Unfortunately, the pharmaceutical company stated that they reached out to him many times to get him started on the medication (we have given him samples) and he did not reach back out.  That same day, we did lab work on him and noted he was in renal failure and sent him to the hospital.  He was admitted immediately and considered for dialysis.  The plan was to place a temporary shunt, but the patient checked himself out [place].  He is currently scheduled to have a left arm AV fistula placed on February 16.  He has been on dialysis with a temporary catheter on Tuesdays/Thursdays/Saturdays.  Patient was in the emergency room with neck pain/muscle spasm in the right trapezius on January 21.  He was treated with a course of Norco.  Current prescribed movement disorder medications: ***   PREVIOUS MEDICATIONS: {Parkinson's RX:18200}  ALLERGIES:   Allergies  Allergen Reactions   Aspirin Swelling   Ibuprofen Rash    CURRENT MEDICATIONS:  No outpatient medications have been marked as taking for the 07/13/22 encounter (Appointment) with Nakota Elsen, Eustace Quail, DO.     Objective:   PHYSICAL EXAMINATION:    VITALS:  There were no vitals filed for this visit.  GEN:  The patient appears stated age and is in NAD. HEENT:  Normocephalic, atraumatic.  The mucous membranes are moist. The superficial temporal arteries are without ropiness or tenderness. CV:   RRR Lungs:  CTAB Neck/HEME:  There are no carotid bruits bilaterally.  Neurological examination:  Orientation: The patient is alert and oriented x3.  Cranial nerves: There is good facial symmetry.  Extraocular muscles are intact. The visual fields are full to confrontational testing. The speech is fluent and clear. Soft palate rises symmetrically and there is no tongue deviation. Hearing is intact to conversational tone. Sensation: Sensation is intact to light touch throughout (facial, trunk, extremities). Vibration is intact at the bilateral big toe. There is no extinction with double simultaneous stimulation.  Motor: Strength is 5/5 in the bilateral upper and lower extremities.   Shoulder shrug is equal and symmetric.  There is no pronator drift. Deep tendon reflexes: Deep tendon reflexes are 0-1/4 at the bilateral biceps, triceps, brachioradialis, patella and achilles. Plantar responses are downgoing bilaterally.   Movement examination: Tone: There is nl tone in the bilateral upper extremities.  The tone in the lower extremities is nl .  Abnormal movements: there is RUE rest tremor with some entrainment (some functional qualities and some not).  There is no postural or intention tremor. Coordination:  There is no decremation with RAM's, Gait and Station: The patient pushes off of the chair to arise.  He ambulates well in the hall.    I have reviewed and interpreted the following labs independently    Chemistry      Component Value Date/Time   NA 135 06/25/2022 1349  NA 142 05/12/2022 1438   K 3.7 06/25/2022 1349   CL 101 06/25/2022 1349   CO2 22 06/25/2022 1349   BUN 45 (H) 06/25/2022 1349   BUN 44 (H) 05/12/2022 1438   CREATININE 9.36 (H) 06/25/2022 1349      Component Value Date/Time   CALCIUM 9.1 06/25/2022 1349   ALKPHOS 79 03/31/2022 0420   AST 16 03/31/2022 0420   ALT 14 03/31/2022 0420   BILITOT 0.6 03/31/2022 0420       Lab Results  Component Value Date   WBC  10.7 (H) 06/25/2022   HGB 10.2 (L) 06/25/2022   HCT 31.0 (L) 06/25/2022   MCV 92.3 06/25/2022   PLT 239 06/25/2022    Lab Results  Component Value Date   TSH 3.36 03/20/2022     Total time spent on today's visit was ***30 minutes, including both face-to-face time and nonface-to-face time.  Time included that spent on review of records (prior notes available to me/labs/imaging if pertinent), discussing treatment and goals, answering patient's questions and coordinating care.  Cc:  Renee Rival, FNP

## 2022-07-13 ENCOUNTER — Ambulatory Visit: Payer: Medicaid Other | Admitting: Neurology

## 2022-07-14 ENCOUNTER — Ambulatory Visit (HOSPITAL_COMMUNITY): Payer: Medicaid Other

## 2022-07-15 ENCOUNTER — Ambulatory Visit (HOSPITAL_COMMUNITY)
Admission: EM | Admit: 2022-07-15 | Discharge: 2022-07-15 | Discharge status: Absent without leave | Payer: Medicaid Other

## 2022-07-16 ENCOUNTER — Ambulatory Visit (HOSPITAL_COMMUNITY): Payer: Medicaid Other

## 2022-07-17 ENCOUNTER — Telehealth: Payer: Self-pay

## 2022-07-17 NOTE — Telephone Encounter (Signed)
Doreatha Massed called and left message requesting a call back on pt's phone number. She stated she is his girlfriend, and asked for pre op arrival time and pt could be heard in background. No further questions/concerns at this time.

## 2022-07-17 NOTE — Telephone Encounter (Signed)
Pt called requesting a sooner surgery date. He has been moved up to this wed. And is aware of his time of arrival. No further questions/concerns at this time.

## 2022-07-18 ENCOUNTER — Encounter (HOSPITAL_COMMUNITY): Payer: Self-pay | Admitting: Surgery

## 2022-07-18 ENCOUNTER — Other Ambulatory Visit: Payer: Self-pay

## 2022-07-18 NOTE — Progress Notes (Signed)
Spoke with pt and his fiance, Aldona Bar for pre-op call. Pt has hx of HTN and CKD. Pt states he is not diabetic. Pt has dialysis on Tu/Th/Sa.  Instructed pt not to smoke as of now until after surgery.  Shower instructions given to pt.

## 2022-07-19 ENCOUNTER — Other Ambulatory Visit: Payer: Self-pay

## 2022-07-19 ENCOUNTER — Encounter (HOSPITAL_COMMUNITY)
Admission: RE | Disposition: A | Discharge status: Home / Self Care | Payer: Self-pay | Source: Home / Self Care | Attending: Surgery

## 2022-07-19 ENCOUNTER — Encounter (HOSPITAL_COMMUNITY): Payer: Self-pay | Admitting: Surgery

## 2022-07-19 ENCOUNTER — Other Ambulatory Visit (HOSPITAL_COMMUNITY): Payer: Self-pay

## 2022-07-19 ENCOUNTER — Ambulatory Visit (HOSPITAL_COMMUNITY)
Admission: RE | Admit: 2022-07-19 | Discharge: 2022-07-19 | Disposition: A | Discharge status: Home / Self Care | Payer: 59 | Attending: Surgery | Admitting: Surgery

## 2022-07-19 ENCOUNTER — Ambulatory Visit (HOSPITAL_BASED_OUTPATIENT_CLINIC_OR_DEPARTMENT_OTHER): Payer: 59 | Admitting: Certified Registered Nurse Anesthetist

## 2022-07-19 ENCOUNTER — Ambulatory Visit (HOSPITAL_COMMUNITY): Payer: 59 | Admitting: Certified Registered Nurse Anesthetist

## 2022-07-19 DIAGNOSIS — J45909 Unspecified asthma, uncomplicated: Secondary | ICD-10-CM | POA: Insufficient documentation

## 2022-07-19 DIAGNOSIS — F17210 Nicotine dependence, cigarettes, uncomplicated: Secondary | ICD-10-CM | POA: Diagnosis not present

## 2022-07-19 DIAGNOSIS — I120 Hypertensive chronic kidney disease with stage 5 chronic kidney disease or end stage renal disease: Secondary | ICD-10-CM | POA: Insufficient documentation

## 2022-07-19 DIAGNOSIS — N186 End stage renal disease: Secondary | ICD-10-CM | POA: Diagnosis present

## 2022-07-19 DIAGNOSIS — Z992 Dependence on renal dialysis: Secondary | ICD-10-CM

## 2022-07-19 DIAGNOSIS — F99 Mental disorder, not otherwise specified: Secondary | ICD-10-CM | POA: Insufficient documentation

## 2022-07-19 DIAGNOSIS — D631 Anemia in chronic kidney disease: Secondary | ICD-10-CM | POA: Diagnosis not present

## 2022-07-19 DIAGNOSIS — I252 Old myocardial infarction: Secondary | ICD-10-CM

## 2022-07-19 DIAGNOSIS — N185 Chronic kidney disease, stage 5: Secondary | ICD-10-CM | POA: Diagnosis not present

## 2022-07-19 DIAGNOSIS — F17200 Nicotine dependence, unspecified, uncomplicated: Secondary | ICD-10-CM | POA: Diagnosis not present

## 2022-07-19 HISTORY — DX: Other specified postprocedural states: Z98.890

## 2022-07-19 HISTORY — DX: Gastro-esophageal reflux disease without esophagitis: K21.9

## 2022-07-19 HISTORY — DX: Nausea with vomiting, unspecified: R11.2

## 2022-07-19 HISTORY — DX: Unspecified osteoarthritis, unspecified site: M19.90

## 2022-07-19 HISTORY — DX: Pneumonia, unspecified organism: J18.9

## 2022-07-19 HISTORY — PX: AV FISTULA PLACEMENT: SHX1204

## 2022-07-19 HISTORY — DX: Essential (primary) hypertension: I10

## 2022-07-19 HISTORY — DX: Chronic kidney disease, unspecified: N18.9

## 2022-07-19 HISTORY — DX: Nausea with vomiting, unspecified: Z98.890

## 2022-07-19 LAB — POCT I-STAT, CHEM 8
BUN: 36 mg/dL — ABNORMAL HIGH (ref 6–20)
Calcium, Ion: 1.25 mmol/L (ref 1.15–1.40)
Chloride: 105 mmol/L (ref 98–111)
Creatinine, Ser: 10 mg/dL — ABNORMAL HIGH (ref 0.61–1.24)
Glucose, Bld: 82 mg/dL (ref 70–99)
HCT: 30 % — ABNORMAL LOW (ref 39.0–52.0)
Hemoglobin: 10.2 g/dL — ABNORMAL LOW (ref 13.0–17.0)
Potassium: 4.3 mmol/L (ref 3.5–5.1)
Sodium: 142 mmol/L (ref 135–145)
TCO2: 25 mmol/L (ref 22–32)

## 2022-07-19 SURGERY — ARTERIOVENOUS (AV) FISTULA CREATION
Anesthesia: General | Site: Arm Upper | Laterality: Left

## 2022-07-19 MED ORDER — OXYCODONE HCL 5 MG PO TABS: 5.0000 mg | ORAL_TABLET | Freq: Once | ORAL | Status: AC | PRN

## 2022-07-19 MED ORDER — ACETAMINOPHEN 325 MG PO TABS: 325.0000 mg | ORAL_TABLET | ORAL | Status: DC | PRN

## 2022-07-19 MED ORDER — FENTANYL CITRATE (PF) 250 MCG/5ML IJ SOLN
INTRAMUSCULAR | Status: DC | PRN
  Administered 2022-07-19: 75 ug via INTRAVENOUS
  Administered 2022-07-19: 25 ug via INTRAVENOUS

## 2022-07-19 MED ORDER — SUGAMMADEX SODIUM 500 MG/5ML IV SOLN
INTRAVENOUS | Status: AC
  Filled 2022-07-19: qty 5

## 2022-07-19 MED ORDER — PROMETHAZINE HCL 25 MG/ML IJ SOLN: 6.2500 mg | INTRAMUSCULAR | Status: DC | PRN

## 2022-07-19 MED ORDER — PROPOFOL 10 MG/ML IV BOLUS
INTRAVENOUS | Status: DC | PRN
  Administered 2022-07-19 (×2): 10 mg via INTRAVENOUS
  Administered 2022-07-19: 150 mg via INTRAVENOUS

## 2022-07-19 MED ORDER — ACETAMINOPHEN 10 MG/ML IV SOLN
INTRAVENOUS | Status: AC
  Filled 2022-07-19: qty 100

## 2022-07-19 MED ORDER — ROCURONIUM BROMIDE 10 MG/ML (PF) SYRINGE
PREFILLED_SYRINGE | INTRAVENOUS | Status: AC
  Filled 2022-07-19: qty 10

## 2022-07-19 MED ORDER — HEPARIN 6000 UNIT IRRIGATION SOLUTION
Status: DC | PRN
  Administered 2022-07-19: 1

## 2022-07-19 MED ORDER — CHLORHEXIDINE GLUCONATE 4 % EX LIQD: 60.0000 mL | Freq: Once | CUTANEOUS | Status: DC

## 2022-07-19 MED ORDER — ONDANSETRON HCL 4 MG/2ML IJ SOLN
INTRAMUSCULAR | Status: DC | PRN
  Administered 2022-07-19: 4 mg via INTRAVENOUS

## 2022-07-19 MED ORDER — LIDOCAINE 2% (20 MG/ML) 5 ML SYRINGE
INTRAMUSCULAR | Status: DC | PRN
  Administered 2022-07-19: 40 mg via INTRAVENOUS

## 2022-07-19 MED ORDER — ONDANSETRON HCL 4 MG/2ML IJ SOLN
INTRAMUSCULAR | Status: AC
  Filled 2022-07-19: qty 2

## 2022-07-19 MED ORDER — GLYCOPYRROLATE PF 0.2 MG/ML IJ SOSY
PREFILLED_SYRINGE | INTRAMUSCULAR | Status: AC
  Filled 2022-07-19: qty 1

## 2022-07-19 MED ORDER — FENTANYL CITRATE (PF) 100 MCG/2ML IJ SOLN
25.0000 ug | INTRAMUSCULAR | Status: DC | PRN
  Administered 2022-07-19: 50 ug via INTRAVENOUS

## 2022-07-19 MED ORDER — SURGIFLO WITH THROMBIN (HEMOSTATIC MATRIX KIT) OPTIME
TOPICAL | Status: DC | PRN
  Administered 2022-07-19: 1 via TOPICAL

## 2022-07-19 MED ORDER — SODIUM CHLORIDE 0.9 % IV SOLN: INTRAVENOUS | Status: DC

## 2022-07-19 MED ORDER — HEPARIN 6000 UNIT IRRIGATION SOLUTION
Status: AC
  Filled 2022-07-19: qty 500

## 2022-07-19 MED ORDER — LIDOCAINE 2% (20 MG/ML) 5 ML SYRINGE
INTRAMUSCULAR | Status: AC
  Filled 2022-07-19: qty 5

## 2022-07-19 MED ORDER — OXYCODONE HCL 5 MG/5ML PO SOLN
ORAL | Status: AC
  Filled 2022-07-19: qty 5

## 2022-07-19 MED ORDER — PROPOFOL 10 MG/ML IV BOLUS
INTRAVENOUS | Status: AC
  Filled 2022-07-19: qty 20

## 2022-07-19 MED ORDER — PHENYLEPHRINE HCL (PRESSORS) 10 MG/ML IV SOLN
INTRAVENOUS | Status: AC
  Filled 2022-07-19: qty 1

## 2022-07-19 MED ORDER — OXYCODONE-ACETAMINOPHEN 5-325 MG PO TABS: 1.0000 | ORAL_TABLET | Freq: Four times a day (QID) | ORAL | 0 refills | Status: DC | PRN

## 2022-07-19 MED ORDER — GLYCOPYRROLATE 0.2 MG/ML IJ SOLN
INTRAMUSCULAR | Status: DC | PRN
  Administered 2022-07-19: .2 mg via INTRAVENOUS

## 2022-07-19 MED ORDER — FENTANYL CITRATE (PF) 250 MCG/5ML IJ SOLN
INTRAMUSCULAR | Status: AC
  Filled 2022-07-19: qty 5

## 2022-07-19 MED ORDER — ACETAMINOPHEN 160 MG/5ML PO SOLN: 325.0000 mg | ORAL | Status: DC | PRN

## 2022-07-19 MED ORDER — FENTANYL CITRATE (PF) 100 MCG/2ML IJ SOLN
INTRAMUSCULAR | Status: AC
  Filled 2022-07-19: qty 2

## 2022-07-19 MED ORDER — CEFAZOLIN SODIUM-DEXTROSE 2-4 GM/100ML-% IV SOLN
2.0000 g | INTRAVENOUS | Status: AC
  Administered 2022-07-19: 2 g via INTRAVENOUS
  Filled 2022-07-19: qty 100

## 2022-07-19 MED ORDER — LIDOCAINE-EPINEPHRINE (PF) 1 %-1:200000 IJ SOLN
INTRAMUSCULAR | Status: AC
  Filled 2022-07-19: qty 30

## 2022-07-19 MED ORDER — ROCURONIUM BROMIDE 10 MG/ML (PF) SYRINGE
PREFILLED_SYRINGE | INTRAVENOUS | Status: DC | PRN
  Administered 2022-07-19: 50 mg via INTRAVENOUS

## 2022-07-19 MED ORDER — ACETAMINOPHEN 10 MG/ML IV SOLN
1000.0000 mg | Freq: Once | INTRAVENOUS | Status: DC | PRN
  Administered 2022-07-19: 1000 mg via INTRAVENOUS

## 2022-07-19 MED ORDER — CHLORHEXIDINE GLUCONATE 0.12 % MT SOLN
15.0000 mL | Freq: Once | OROMUCOSAL | Status: AC
  Administered 2022-07-19: 15 mL via OROMUCOSAL
  Filled 2022-07-19: qty 15

## 2022-07-19 MED ORDER — OXYCODONE-ACETAMINOPHEN 5-325 MG PO TABS
1.0000 | ORAL_TABLET | Freq: Four times a day (QID) | ORAL | 0 refills | Status: DC | PRN
  Filled 2022-07-19: qty 20, 5d supply, fill #0

## 2022-07-19 MED ORDER — 0.9 % SODIUM CHLORIDE (POUR BTL) OPTIME
TOPICAL | Status: DC | PRN
  Administered 2022-07-19: 1000 mL

## 2022-07-19 MED ORDER — SUGAMMADEX SODIUM 200 MG/2ML IV SOLN
INTRAVENOUS | Status: DC | PRN
  Administered 2022-07-19: 500 mg via INTRAVENOUS

## 2022-07-19 MED ORDER — OXYCODONE HCL 5 MG/5ML PO SOLN
5.0000 mg | Freq: Once | ORAL | Status: AC | PRN
  Administered 2022-07-19: 5 mg via ORAL

## 2022-07-19 MED ORDER — ORAL CARE MOUTH RINSE: 15.0000 mL | Freq: Once | OROMUCOSAL | Status: AC

## 2022-07-19 MED ORDER — ALBUTEROL SULFATE HFA 108 (90 BASE) MCG/ACT IN AERS
INHALATION_SPRAY | RESPIRATORY_TRACT | Status: DC | PRN
  Administered 2022-07-19: 5 via RESPIRATORY_TRACT

## 2022-07-19 SURGICAL SUPPLY — 29 items
ADH SKN CLS APL DERMABOND .7 (GAUZE/BANDAGES/DRESSINGS) ×1
AGENT HMST KT MTR STRL THRMB (HEMOSTASIS) ×1
ARMBAND PINK RESTRICT EXTREMIT (MISCELLANEOUS) ×4 IMPLANT
BAG COUNTER SPONGE SURGICOUNT (BAG) ×2 IMPLANT
BAG SPNG CNTER NS LX DISP (BAG) ×1
CANISTER SUCT 3000ML PPV (MISCELLANEOUS) ×2 IMPLANT
CLIP VESOCCLUDE MED 6/CT (CLIP) ×2 IMPLANT
CLIP VESOCCLUDE SM WIDE 6/CT (CLIP) ×2 IMPLANT
COVER PROBE W GEL 5X96 (DRAPES) ×2 IMPLANT
DERMABOND ADVANCED .7 DNX12 (GAUZE/BANDAGES/DRESSINGS) ×2 IMPLANT
ELECT REM PT RETURN 9FT ADLT (ELECTROSURGICAL) ×1
ELECTRODE REM PT RTRN 9FT ADLT (ELECTROSURGICAL) ×2 IMPLANT
GOWN STRL REUS W/ TWL LRG LVL3 (GOWN DISPOSABLE) ×4 IMPLANT
GOWN STRL REUS W/ TWL XL LVL3 (GOWN DISPOSABLE) ×2 IMPLANT
GOWN STRL REUS W/TWL LRG LVL3 (GOWN DISPOSABLE) ×2
GOWN STRL REUS W/TWL XL LVL3 (GOWN DISPOSABLE) ×2
KIT BASIN OR (CUSTOM PROCEDURE TRAY) ×2 IMPLANT
KIT TURNOVER KIT B (KITS) ×2 IMPLANT
NS IRRIG 1000ML POUR BTL (IV SOLUTION) ×2 IMPLANT
PACK CV ACCESS (CUSTOM PROCEDURE TRAY) ×2 IMPLANT
PAD ARMBOARD 7.5X6 YLW CONV (MISCELLANEOUS) ×4 IMPLANT
SURGIFLO W/THROMBIN 8M KIT (HEMOSTASIS) IMPLANT
SUT PROLENE 6 0 CC (SUTURE) ×2 IMPLANT
SUT VIC AB 3-0 SH 27 (SUTURE) ×1
SUT VIC AB 3-0 SH 27X BRD (SUTURE) ×2 IMPLANT
SUT VICRYL 4-0 PS2 18IN ABS (SUTURE) ×2 IMPLANT
TOWEL GREEN STERILE (TOWEL DISPOSABLE) ×2 IMPLANT
UNDERPAD 30X36 HEAVY ABSORB (UNDERPADS AND DIAPERS) ×2 IMPLANT
WATER STERILE IRR 1000ML POUR (IV SOLUTION) ×2 IMPLANT

## 2022-07-19 NOTE — Discharge Instructions (Signed)
° °  [facility] ° °Discharge Instructions ° °AV Fistula or Graft Surgery for Dialysis Access ° °Please refer to the following instructions for your post-procedure care. Your surgeon or physician assistant will discuss any changes with you. ° °Activity ° °You may drive the day following your surgery, if you are comfortable and no longer taking prescription pain medication. Resume full activity as the soreness in your incision resolves. ° °Bathing/Showering ° °You may shower after you go home. Keep your incision dry for 48 hours. Do not soak in a bathtub, hot tub, or swim until the incision heals completely. You may not shower if you have a hemodialysis catheter. ° °Incision Care ° °Clean your incision with mild soap and water after 48 hours. Pat the area dry with a clean towel. You do not need a bandage unless otherwise instructed. Do not apply any ointments or creams to your incision. You may have skin glue on your incision. Do not peel it off. It will come off on its own in about one week. Your arm may swell a bit after surgery. To reduce swelling use pillows to elevate your arm so it is above your heart. Your doctor will tell you if you need to lightly wrap your arm with an ACE bandage. ° °Diet ° °Resume your normal diet. There are not special food restrictions following this procedure. In order to heal from your surgery, it is CRITICAL to get adequate nutrition. Your body requires vitamins, minerals, and protein. Vegetables are the best source of vitamins and minerals. Vegetables also provide the perfect balance of protein. Processed food has little nutritional value, so try to avoid this. ° °Medications ° °Resume taking all of your medications. If your incision is causing pain, you may take over-the counter pain relievers such as acetaminophen (Tylenol). If you were prescribed a stronger pain medication, please be aware these medications can cause nausea and constipation. Prevent  nausea by taking the medication with a snack or meal. Avoid constipation by drinking plenty of fluids and eating foods with high amount of fiber, such as fruits, vegetables, and grains. Do not take Tylenol if you are taking prescription pain medications. ° ° ° ° °Follow up °Your surgeon may want to see you in the office following your access surgery. If so, this will be arranged at the time of your surgery. ° °Please call us immediately for any of the following conditions: ° °Increased pain, redness, drainage (pus) from your incision site °Fever of 101 degrees or higher °Severe or worsening pain at your incision site °Hand pain or numbness. ° °Reduce your risk of vascular disease: ° °Stop smoking. If you would like help, call [facility] at 1-800-QUIT-NOW (1-800-784-8669) or [facility] at [phone] ° °Manage your cholesterol °Maintain a desired weight °Control your diabetes °Keep your blood pressure down ° °Dialysis ° °It will take several weeks to several months for your new dialysis access to be ready for use. Your surgeon will determine when it is OK to use it. Your nephrologist will continue to direct your dialysis. You can continue to use your Permcath until your new access is ready for use. ° °If you have any questions, please call the office at [phone]. ° °

## 2022-07-19 NOTE — Interval H&P Note (Signed)
History and Physical Interval Note:  07/19/2022 7:59 AM  William Gregory  has presented today for surgery, with the diagnosis of ESRD.  The various methods of treatment have been discussed with the patient and family. After consideration of risks, benefits and other options for treatment, the patient has consented to  Procedure(s): LEFT ARM ARTERIOVENOUS (AV) FISTULA VERSUS ARTERIOVENOUS GRAFT CREATION (Left) as a surgical intervention.  The patient's history has been reviewed, patient examined, no change in status, stable for surgery.  I have reviewed the patient's chart and labs.  Questions were answered to the patient's satisfaction.     Annamarie Major

## 2022-07-19 NOTE — Anesthesia Postprocedure Evaluation (Signed)
Anesthesia Post Note  Patient: William Gregory  Procedure(s) Performed: LEFT ARM ARTERIOVENOUS (AV) FISTULA CREATION (Left: Arm Upper)     Patient location during evaluation: PACU Anesthesia Type: General Level of consciousness: awake and alert Pain management: pain level controlled Vital Signs Assessment: post-procedure vital signs reviewed and stable Respiratory status: spontaneous breathing, nonlabored ventilation, respiratory function stable and patient connected to nasal cannula oxygen Cardiovascular status: blood pressure returned to baseline and stable Postop Assessment: no apparent nausea or vomiting Anesthetic complications: no  No notable events documented.  Last Vitals:  Vitals:   07/19/22 1015 07/19/22 1030  BP: (!) 152/102   Pulse: 94   Resp: 14   Temp:  36.4 C  SpO2: 100%     Last Pain:  Vitals:   07/19/22 1000  TempSrc:   PainSc: 0-No pain                 Effie Berkshire

## 2022-07-19 NOTE — Anesthesia Preprocedure Evaluation (Signed)
Anesthesia Evaluation    Reviewed: Allergy & Precautions, NPO status , Patient's Chart, lab work & pertinent test results  Airway Mallampati: II  TM Distance: >3 FB Neck ROM: Full    Dental  (+) Missing, Dental Advisory Given, Poor Dentition, Chipped   Pulmonary asthma , Current Smoker    + decreased breath sounds      Cardiovascular hypertension, Pt. on medications + Past MI   Rhythm:Regular Rate:Normal     Neuro/Psych  PSYCHIATRIC DISORDERS  Depression    negative neurological ROS     GI/Hepatic Neg liver ROS,GERD  ,,  Endo/Other  negative endocrine ROS    Renal/GU ESRF and DialysisRenal disease     Musculoskeletal  (+) Arthritis ,    Abdominal   Peds  Hematology  (+) Blood dyscrasia, anemia   Anesthesia Other Findings   Reproductive/Obstetrics                             Anesthesia Physical Anesthesia Plan  ASA: 3  Anesthesia Plan: General   Post-op Pain Management: Minimal or no pain anticipated   Induction: Intravenous  PONV Risk Score and Plan: 2 and Ondansetron and Midazolam  Airway Management Planned: Oral ETT  Additional Equipment: None  Intra-op Plan:   Post-operative Plan: Extubation in OR  Informed Consent: I have reviewed the patients History and Physical, chart, labs and discussed the procedure including the risks, benefits and alternatives for the proposed anesthesia with the patient or authorized representative who has indicated his/her understanding and acceptance.     Dental advisory given  Plan Discussed with: CRNA  Anesthesia Plan Comments:        Anesthesia Quick Evaluation

## 2022-07-19 NOTE — Transfer of Care (Signed)
Immediate Anesthesia Transfer of Care Note  Patient: William Gregory  Procedure(s) Performed: LEFT ARM ARTERIOVENOUS (AV) FISTULA CREATION (Left: Arm Upper)  Patient Location: PACU  Anesthesia Type:General  Level of Consciousness: patient cooperative and responds to stimulation  Airway & Oxygen Therapy: Patient Spontanous Breathing  Post-op Assessment: Report given to RN, Post -op Vital signs reviewed and stable, and Patient moving all extremities X 4  Post vital signs: Reviewed and stable  Last Vitals:  Vitals Value Taken Time  BP 147/86 07/19/22 1000  Temp    Pulse 112 07/19/22 1001  Resp 18 07/19/22 1001  SpO2 92 % 07/19/22 1001  Vitals shown include unvalidated device data.  Last Pain:  Vitals:   07/19/22 0723  TempSrc:   PainSc: 0-No pain         Complications: No notable events documented.

## 2022-07-19 NOTE — Op Note (Signed)
    Patient name: William Gregory MRN: [id number] DOB: Oct 23, 1985 Sex: male  07/19/2022 Pre-operative Diagnosis: ESRD Post-operative diagnosis:  Same Surgeon:  Annamarie Major Assistants:  Laurence Slate, PA Procedure:   Left brachiocephalic fistula Anesthesia:  General Blood Loss:  minimal Specimens:  none   Findings:  Excellent 5 mm vein, 4 mm artery (disease free)  Indications: This is a 37 year old gentleman with end-stage renal disease in need of permanent access.  I discussed proceeding with fistula.  I also discussed the possibility of elevation at a later date.  Procedure:  The patient was identified in the holding area and taken to [place] [facility]  The patient was then placed supine on the table. general anesthesia was administered.  The patient was prepped and draped in the usual sterile fashion.  A time out was called and antibiotics were administered.  A PA was necessary to expedite the procedure and assist with technical details.  She help with exposure by providing suction and retraction.  She also help with the anastomosis by following the suture.  Ultrasound was used to evaluate the cephalic vein in the upper arm.  This was a very healthy vein measuring 5 mm.  A transverse incision was made just proximal to the antecubital crease.  I first dissected out the brachial artery.  This was a disease-free 4 mm artery.  Next, the cephalic vein was isolated.  There were multiple branches that were ligated.  It was marked for orientation and ligated distally with a 2-0 silk tie.  It distended nicely with heparin saline.  Next, the artery was occluded with vascular clamps.  A #11 blade was used to make an arteriotomy that was extended longitudinally with Potts scissors.  The vein was cut to the appropriate length and spatulated to fit the size the arteriotomy.  A running anastomosis was created 6-0 Prolene.  Prior to completion the appropriate flushing maneuvers were performed and the anastomosis  was completed.  Blood flow was then reestablished to the fistula.  There was a palpable thrill in the fistula and a palpable radial pulse.  The wound was irrigated.  Hemostasis was achieved.  The wound was closed with 2 layers of Vicryl followed by Dermabond.  There were no immediate complications.   Disposition: To PACU stable   V. Annamarie Major, M.D., [facility] [place] Office: [phone] Pager:  [phone]

## 2022-07-20 ENCOUNTER — Encounter (HOSPITAL_COMMUNITY): Payer: Self-pay | Admitting: Surgery

## 2022-08-03 DIAGNOSIS — N186 End stage renal disease: Secondary | ICD-10-CM | POA: Diagnosis not present

## 2022-08-03 DIAGNOSIS — I129 Hypertensive chronic kidney disease with stage 1 through stage 4 chronic kidney disease, or unspecified chronic kidney disease: Secondary | ICD-10-CM | POA: Diagnosis not present

## 2022-08-03 DIAGNOSIS — Z992 Dependence on renal dialysis: Secondary | ICD-10-CM | POA: Diagnosis not present

## 2022-08-07 ENCOUNTER — Other Ambulatory Visit: Payer: Self-pay | Admitting: *Deleted

## 2022-08-07 DIAGNOSIS — N186 End stage renal disease: Secondary | ICD-10-CM

## 2022-08-12 ENCOUNTER — Ambulatory Visit (HOSPITAL_COMMUNITY): Payer: Medicaid Other

## 2022-08-13 ENCOUNTER — Encounter (HOSPITAL_COMMUNITY): Payer: Self-pay

## 2022-08-13 ENCOUNTER — Ambulatory Visit (HOSPITAL_COMMUNITY)
Admission: RE | Admit: 2022-08-13 | Discharge: 2022-08-13 | Disposition: A | Discharge status: Home / Self Care | Payer: Medicaid Other | Source: Ambulatory Visit | Attending: Nurse Practitioner | Admitting: Nurse Practitioner

## 2022-08-13 VITALS — BP 137/76 | HR 86 | Temp 98.6°F | Resp 18

## 2022-08-13 DIAGNOSIS — K0889 Other specified disorders of teeth and supporting structures: Secondary | ICD-10-CM

## 2022-08-13 DIAGNOSIS — K029 Dental caries, unspecified: Secondary | ICD-10-CM | POA: Diagnosis not present

## 2022-08-13 DIAGNOSIS — K047 Periapical abscess without sinus: Secondary | ICD-10-CM | POA: Diagnosis not present

## 2022-08-13 MED ORDER — CHLORHEXIDINE GLUCONATE 0.12 % MT SOLN: 15.0000 mL | Freq: Two times a day (BID) | OROMUCOSAL | 0 refills | Status: DC

## 2022-08-13 MED ORDER — HYDROCODONE-ACETAMINOPHEN 5-325 MG PO TABS: 2.0000 | ORAL_TABLET | ORAL | 0 refills | Status: DC | PRN

## 2022-08-13 MED ORDER — AMOXICILLIN 400 MG/5ML PO SUSR: 1000.0000 mg | Freq: Two times a day (BID) | ORAL | 0 refills | Status: AC

## 2022-08-13 NOTE — Discharge Instructions (Addendum)
Follow-up with a dentist as soon as possible. I have provided you with a hand out with low-cost dental resources. Call and schedule an appointment with one of these providers or one who accepts medicaid   Take antibiotics as prescribed  Use the prescribed mouth rinse after meals and before bed   Take prescribed pain medication as needed  Do not take any additional tylenol Avoid foods or drinks that causes increased pain  Eat soft foods  Do not smoke  Avoid using straws   Go to the ED immediately if:  You are unable to open your mouth. You are having trouble breathing or swallowing. You have a fever. You notice that your face, neck, or jaw is swollen.

## 2022-08-13 NOTE — ED Triage Notes (Signed)
Pt is here for dental pain. Pt tried peroxide and tylenol but, did not help

## 2022-08-13 NOTE — ED Provider Notes (Signed)
[facility]    CSN: [id number] Arrival date & time: 08/13/22  1112      History   Chief Complaint Chief Complaint  Patient presents with   Dental Pain    HPI William Gregory is a 37 y.o. male.   History of Present Illness  William Gregory is a 37 y.o. male who presents with complaint of toothache. Onset of symptoms was 1 week ago. Patient describes pain as aching and throbbing. Pain severity now is 10 /10. The pain radiates throughout the entire left side of  his face. Patient denies jaw swelling or fever >101. Pain is aggravated by movement and use. Pain is alleviated by nothing. He has tried peroxide and tylenol without any relief in symptoms. Patient had dentist appointment on 08/01/22 but cancelled because he had to go to dialysis. He is currently looking for a dentist that accepts Medicaid. The patient denies other complaints.       Past Medical History:  Diagnosis Date   Alcohol abuse    Arthritis    Asthma    Back pain    Chronic kidney disease (CKD)    Stage 5 Dialysis Tu/Th/Sa   Depression    GERD (gastroesophageal reflux disease)    Hypertension    Knee joint pain, left    Neck pain    Pneumonia    PONV (postoperative nausea and vomiting)    Psychiatric problem    unspecified by patient (receives "injections" bi-weekly)   Tobacco abuse     Patient Active Problem List   Diagnosis Date Noted   Nasal congestion 07/10/2022   Acute cough 07/10/2022   ESRD (end stage renal disease) ([place]) 05/12/2022   Renal failure 03/30/2022   Essential hypertension 03/30/2022   Anemia of chronic disease 03/29/2022   Screening for diabetes mellitus 03/29/2022   Marijuana use 03/29/2022   Cocaine abuse in remission ([place]) 03/29/2022   Tobacco abuse counseling 03/29/2022   Health care maintenance 03/29/2022   Tardive dyskinesia 03/29/2022   Need for influenza vaccination 03/29/2022   High blood pressure 03/29/2022   Hyperparathyroidism, secondary renal  ([place]) 03/29/2022   AKI (acute kidney injury) ([place]) 08/14/2015   Nausea vomiting and diarrhea 08/14/2015   Abdominal pain 08/14/2015   GERD (gastroesophageal reflux disease) 08/14/2015   Acute kidney injury ([place])    ACUTE KIDNEY FAILURE UNSPECIFIED 07/09/2009   Tobacco abuse 06/07/2009   KNEE PAIN, RIGHT, CHRONIC 01/15/2009   Upper back pain on right side 01/15/2009   LEG EDEMA, BILATERAL 12/15/2008   Alcohol abuse 04/02/2008   Depression 04/02/2008   MYOCARDIAL INFARCTION, HX OF 04/02/2008   Asthma 04/02/2008    Past Surgical History:  Procedure Laterality Date   arm surgery     AV FISTULA PLACEMENT Left 07/19/2022   Procedure: LEFT ARM ARTERIOVENOUS (AV) FISTULA CREATION;  Surgeon: Serafina Mitchell, MD;  Location: [facility] OR;  Service: Vascular;  Laterality: Left;   IR FLUORO GUIDE CV LINE RIGHT  03/31/2022   IR PERC TUN PERIT CATH WO PORT S&I /IMAG  03/31/2022   IR US GUIDE VASC ACCESS RIGHT  03/31/2022       Home Medications    Prior to Admission medications   Medication Sig Start Date End Date Taking? Authorizing Provider  acetaminophen (TYLENOL) 500 MG tablet Take 500 mg by mouth every 6 (six) hours as needed for moderate pain.   Yes [provider]  albuterol (VENTOLIN HFA) 108 (90 Base) MCG/ACT inhaler Inhale 2 puffs into  the lungs every 4 (four) hours as needed for wheezing or shortness of breath. 02/12/22  Yes Barrett Henle, MD  amLODipine (NORVASC) 5 MG tablet Take 1 tablet (5 mg total) by mouth daily. 05/12/22  Yes Paseda, Dewaine Conger, FNP  amoxicillin (AMOXIL) 400 MG/5ML suspension Take 12.5 mLs (1,000 mg total) by mouth 2 (two) times daily for 7 days. 08/13/22 08/20/22 Yes Enrique Sack, FNP  chlorhexidine (PERIDEX) 0.12 % solution Use as directed 15 mLs in the mouth or throat 2 (two) times daily. Gargle with 15 mL after meals and at bedtime 08/13/22  Yes Jim Philemon, Aldona Bar, FNP  citalopram (CELEXA) 20 MG tablet Take 20 mg by mouth every morning. 05/26/22   Yes [provider]  HYDROcodone-acetaminophen (NORCO/VICODIN) 5-325 MG tablet Take 2 tablets by mouth every 4 (four) hours as needed for severe pain. 08/13/22  Yes Enrique Sack, FNP  benzonatate (TESSALON PERLES) 100 MG capsule Take 1 capsule (100 mg total) by mouth 3 (three) times daily as needed for cough. Patient not taking: Reported on 07/17/2022 07/10/22 07/10/23  Renee Rival, FNP  cyclobenzaprine (FLEXERIL) 5 MG tablet Take 1 tablet (5 mg total) by mouth 3 (three) times daily as needed for muscle spasms. Patient not taking: Reported on 07/17/2022 07/10/22   Renee Rival, FNP  fluticasone (FLONASE) 50 MCG/ACT nasal spray Place 2 sprays into both nostrils daily. Patient not taking: Reported on 07/17/2022 07/10/22   Renee Rival, FNP  lidocaine (LIDODERM) 5 % Place 1 patch onto the skin daily. Remove & Discard patch within 12 hours or as directed by MD Patient taking differently: Place 1 patch onto the skin daily as needed (pain). Remove & Discard patch within 12 hours or as directed by MD 06/25/22   Regan Lemming, MD  paliperidone Palmitate ER (INVEGA TRINZA) 819 MG/2.63ML injection Inject 819 mg into the muscle every 3 (three) months.    [provider]  valbenazine (INGREZZA) 40 MG capsule Take 40 mg by mouth daily.    [provider]    Family History Family History  Problem Relation Age of Onset   Diabetes Mother    Diabetes Father    CVA Father    Dementia Maternal Grandmother    Kidney disease Maternal Grandmother    Heart attack Neg Hx     Social History Social History   Tobacco Use   Smoking status: Every Day    Packs/day: 1.00    Types: Cigarettes   Smokeless tobacco: Never  Vaping Use   Vaping Use: Former  Substance Use Topics   Alcohol use: Not Currently    Comment: sober x "months"   Drug use: Not Currently    Types: Marijuana    Comment: cocaine in the past     Allergies   Aspirin and Ibuprofen   Review of  Systems Review of Systems  Constitutional:  Negative for fever.  HENT:  Positive for dental problem.   Gastrointestinal:  Negative for nausea and vomiting.  Musculoskeletal:  Negative for neck pain.  Neurological:  Positive for headaches.     Physical Exam Triage Vital Signs ED Triage Vitals  Enc Vitals Group     BP 08/13/22 1157 137/76     Pulse Rate 08/13/22 1157 86     Resp 08/13/22 1157 18     Temp 08/13/22 1157 98.6 F (37 C)     Temp Source 08/13/22 1157 Oral     SpO2 08/13/22 1157 96 %  Weight --      Height --      Head Circumference --      Peak Flow --      Pain Score 08/13/22 1154 10     Pain Loc --      Pain Edu? --      Excl. in [place]? --    No data found.  Updated Vital Signs BP 137/76 (BP Location: Left Arm)   Pulse 86   Temp 98.6 F (37 C) (Oral)   Resp 18   SpO2 96%   Visual Acuity Right Eye Distance:   Left Eye Distance:   Bilateral Distance:    Right Eye Near:   Left Eye Near:    Bilateral Near:     Physical Exam Vitals reviewed.  Constitutional:      General: He is not in acute distress.    Appearance: Normal appearance. He is not ill-appearing or toxic-appearing.  HENT:     Head: Normocephalic.     Nose: Nose normal.     Mouth/Throat:     Lips: No lesions.     Mouth: Mucous membranes are moist.     Dentition: Dental tenderness and dental caries present. No gingival swelling or dental abscesses.     Pharynx: Oropharynx is clear. Uvula midline. No pharyngeal swelling, oropharyngeal exudate, posterior oropharyngeal erythema or uvula swelling.     Comments: Poor dentition throughout with multiple caries.  Eyes:     Conjunctiva/sclera: Conjunctivae normal.  Cardiovascular:     Rate and Rhythm: Normal rate.  Pulmonary:     Effort: Pulmonary effort is normal.  Musculoskeletal:        General: Normal range of motion.     Cervical back: Normal range of motion and neck supple.  Skin:    General: Skin is warm and dry.  Neurological:      General: No focal deficit present.     Mental Status: He is alert and oriented to person, place, and time.      UC Treatments / Results  Labs (all labs ordered are listed, but only abnormal results are displayed) Labs Reviewed - No data to display  EKG   Radiology No results found.  Procedures Procedures (including critical care time)  Medications Ordered in UC Medications - No data to display  Initial Impression / Assessment and Plan / UC Course  I have reviewed the triage vital signs and the nursing notes.  Pertinent labs & imaging results that were available during my care of the patient were reviewed by me and considered in my medical decision making (see chart for details).    37 yo male with multiple medical issues including end-stage renal disease on hemodialysis presents with dentalgia.  Patient is afebrile and nontoxic.  He has poor dentition throughout with multiple dental caries.  No obvious abscess noted.  Patient prescribed amoxicillin, chlorhexidine mouth rinses and short course of Vicodin.  Supportive care measures as well as smoking cessation discussed.  Patient should follow-up with dentistry as soon as possible.  Handout with low-cost dental resources provided to patient.  ED precautions reviewed.  Today's evaluation has revealed no signs of a dangerous process. Discussed diagnosis with patient and/or guardian. Patient and/or guardian aware of their diagnosis, possible red flag symptoms to watch out for and need for close follow up. Patient and/or guardian understands verbal and written discharge instructions. Patient and/or guardian comfortable with plan and disposition.  Patient and/or guardian has a clear mental status at  this time, good insight into illness (after discussion and teaching) and has clear judgment to make decisions regarding their care  Documentation was completed with the aid of voice recognition software. Transcription may contain  typographical errors. Final Clinical Impressions(s) / UC Diagnoses   Final diagnoses:  Dental infection  Dental caries  Tooth pain     Discharge Instructions      Follow-up with a dentist as soon as possible. I have provided you with a hand out with low-cost dental resources. Call and schedule an appointment with one of these providers or one who accepts medicaid   Take antibiotics as prescribed  Use the prescribed mouth rinse after meals and before bed   Take prescribed pain medication as needed  Do not take any additional tylenol Avoid foods or drinks that causes increased pain  Eat soft foods  Do not smoke  Avoid using straws   Go to the ED immediately if:  You are unable to open your mouth. You are having trouble breathing or swallowing. You have a fever. You notice that your face, neck, or jaw is swollen.     ED Prescriptions     Medication Sig Dispense Auth. Provider   amoxicillin (AMOXIL) 400 MG/5ML suspension Take 12.5 mLs (1,000 mg total) by mouth 2 (two) times daily for 7 days. 175 mL Enrique Sack, FNP   HYDROcodone-acetaminophen (NORCO/VICODIN) 5-325 MG tablet Take 2 tablets by mouth every 4 (four) hours as needed for severe pain. 10 tablet Enrique Sack, FNP   chlorhexidine (PERIDEX) 0.12 % solution Use as directed 15 mLs in the mouth or throat 2 (two) times daily. Gargle with 15 mL after meals and at bedtime 118 mL Enrique Sack, FNP      I have reviewed the PDMP during this encounter.   Enrique Sack, [place] 08/13/22 1318

## 2022-08-21 ENCOUNTER — Ambulatory Visit (INDEPENDENT_AMBULATORY_CARE_PROVIDER_SITE_OTHER): Payer: Medicaid Other | Admitting: Physician Assistant

## 2022-08-21 ENCOUNTER — Ambulatory Visit (HOSPITAL_COMMUNITY)
Admission: RE | Admit: 2022-08-21 | Discharge: 2022-08-21 | Disposition: A | Discharge status: Home / Self Care | Payer: Medicaid Other | Source: Ambulatory Visit | Attending: Surgery | Admitting: Surgery

## 2022-08-21 VITALS — BP 108/66 | HR 61 | Temp 97.8°F | Resp 18 | Ht 65.0 in | Wt 224.0 lb

## 2022-08-21 DIAGNOSIS — N186 End stage renal disease: Secondary | ICD-10-CM | POA: Diagnosis not present

## 2022-08-21 NOTE — Progress Notes (Signed)
POST OPERATIVE OFFICE NOTE    CC:  F/u for surgery  HPI:  This is a 37 y.o. male who is s/p left BC AVF on 07/19/2022 by Dr. Trula Slade.  He has [place] that was placed 03/31/2022 by IR.  Pt states he does not have pain/numbness in the left hand.    Patient takes medication for asthma. He has a psychiatric disorder. He is medically managed for hypertension. He is a current smoker.   The pt is on dialysis T/T/S at [facility] location.   Allergies  Allergen Reactions   Aspirin Swelling   Ibuprofen Rash    Current Outpatient Medications  Medication Sig Dispense Refill   acetaminophen (TYLENOL) 500 MG tablet Take 500 mg by mouth every 6 (six) hours as needed for moderate pain.     albuterol (VENTOLIN HFA) 108 (90 Base) MCG/ACT inhaler Inhale 2 puffs into the lungs every 4 (four) hours as needed for wheezing or shortness of breath. 18 g 0   amLODipine (NORVASC) 5 MG tablet Take 1 tablet (5 mg total) by mouth daily. 90 tablet 1   benzonatate (TESSALON PERLES) 100 MG capsule Take 1 capsule (100 mg total) by mouth 3 (three) times daily as needed for cough. (Patient not taking: Reported on 07/17/2022) 30 capsule 0   chlorhexidine (PERIDEX) 0.12 % solution Use as directed 15 mLs in the mouth or throat 2 (two) times daily. Gargle with 15 mL after meals and at bedtime 118 mL 0   citalopram (CELEXA) 20 MG tablet Take 20 mg by mouth every morning.     cyclobenzaprine (FLEXERIL) 5 MG tablet Take 1 tablet (5 mg total) by mouth 3 (three) times daily as needed for muscle spasms. (Patient not taking: Reported on 07/17/2022) 30 tablet 0   fluticasone (FLONASE) 50 MCG/ACT nasal spray Place 2 sprays into both nostrils daily. (Patient not taking: Reported on 07/17/2022) 16 g 0   HYDROcodone-acetaminophen (NORCO/VICODIN) 5-325 MG tablet Take 2 tablets by mouth every 4 (four) hours as needed for severe pain. 10 tablet 0   lidocaine (LIDODERM) 5 % Place 1 patch onto the skin daily. Remove & Discard patch within 12 hours  or as directed by MD (Patient taking differently: Place 1 patch onto the skin daily as needed (pain). Remove & Discard patch within 12 hours or as directed by MD) 30 patch 0   paliperidone Palmitate ER (INVEGA TRINZA) 819 MG/2.63ML injection Inject 819 mg into the muscle every 3 (three) months.     valbenazine (INGREZZA) 40 MG capsule Take 40 mg by mouth daily.     No current facility-administered medications for this visit.     ROS:  See HPI  Physical Exam:  Today's Vitals   08/21/22 1105  BP: 108/66  Pulse: 61  Resp: 18  Temp: 97.8 F (36.6 C)  TempSrc: Temporal  SpO2: 100%  Weight: 224 lb (101.6 kg)  Height: 5\' 5"  (1.651 m)   Body mass index is 37.28 kg/m.   Incision:  well healed Extremities:   There is a palpable left radial pulse.   Motor and sensory are in tact.   There is a thrill/bruit present.  Access is  easily palpable   Dialysis Duplex on 08/21/2022: +------------+----------+-------------+----------+--------+  OUTFLOW VEINPSV (cm/s)Diameter (cm)Depth (cm)Describe  +------------+----------+-------------+----------+--------+  Prox UA        161        0.75        1.06             +------------+----------+-------------+----------+--------+  Mid UA         366        0.62        0.53             +------------+----------+-------------+----------+--------+  Dist UA        250        0.80        0.46             +------------+----------+-------------+----------+--------+  AC Fossa       521        0.69        0.80             +------------+----------+-------------+----------+--------+    Assessment/Plan:  This is a 38 y.o. male who is s/p: left BC AVF on 07/19/2022 by Dr. Trula Slade.  He has [place] that was placed 03/31/2022 by IR.  -the pt does not have evidence of steal. -pt's access can be used Oct 17, 2022. -pt requested pain medication-discussed that at 6 weeks out, he should not require narcotic pain medication.  -if pt has  tunneled catheter, this can be removed at the discretion of the dialysis center once the pt's access has been successfully cannulated to their satisfaction.  -discussed with pt that access does not last forever and will need intervention or even new access at some point.  -the pt will follow up as needed.    Leontine Locket, [facility] [phone]  Clinic MD:  Trula Slade

## 2022-09-11 ENCOUNTER — Ambulatory Visit: Payer: Medicaid Other | Admitting: Nurse Practitioner

## 2022-09-13 ENCOUNTER — Ambulatory Visit: Payer: Medicaid Other | Admitting: Nurse Practitioner

## 2022-10-04 ENCOUNTER — Encounter: Payer: Self-pay | Admitting: Nurse Practitioner

## 2022-10-04 ENCOUNTER — Ambulatory Visit (INDEPENDENT_AMBULATORY_CARE_PROVIDER_SITE_OTHER): Payer: Medicaid Other | Admitting: Nurse Practitioner

## 2022-10-04 VITALS — BP 137/80 | HR 70 | Temp 97.7°F | Ht 65.0 in | Wt 228.6 lb

## 2022-10-04 DIAGNOSIS — N186 End stage renal disease: Secondary | ICD-10-CM | POA: Diagnosis not present

## 2022-10-04 DIAGNOSIS — G2401 Drug induced subacute dyskinesia: Secondary | ICD-10-CM

## 2022-10-04 DIAGNOSIS — F1290 Cannabis use, unspecified, uncomplicated: Secondary | ICD-10-CM

## 2022-10-04 DIAGNOSIS — I10 Essential (primary) hypertension: Secondary | ICD-10-CM | POA: Diagnosis not present

## 2022-10-04 DIAGNOSIS — N2581 Secondary hyperparathyroidism of renal origin: Secondary | ICD-10-CM

## 2022-10-04 DIAGNOSIS — Z716 Tobacco abuse counseling: Secondary | ICD-10-CM

## 2022-10-04 DIAGNOSIS — F1411 Cocaine abuse, in remission: Secondary | ICD-10-CM | POA: Diagnosis not present

## 2022-10-04 DIAGNOSIS — Z720 Tobacco use: Secondary | ICD-10-CM

## 2022-10-04 DIAGNOSIS — F32A Depression, unspecified: Secondary | ICD-10-CM

## 2022-10-04 MED ORDER — CITALOPRAM HYDROBROMIDE 20 MG PO TABS: 20.0000 mg | ORAL_TABLET | Freq: Every morning | ORAL | 1 refills | Status: DC

## 2022-10-04 MED ORDER — AMLODIPINE BESYLATE 5 MG PO TABS: 5.0000 mg | ORAL_TABLET | Freq: Every day | ORAL | 1 refills | Status: DC

## 2022-10-04 NOTE — Assessment & Plan Note (Signed)
BP Readings from Last 3 Encounters:  10/04/22 137/80  08/21/22 108/66  08/13/22 137/76   HTN Controlled on amlodipine 5 mg daily medication refilled Continue current medications. No changes in management. Discussed DASH diet and dietary sodium restrictions Continue to increase dietary efforts and exercise.  Follow-up in 4 months

## 2022-10-04 NOTE — Progress Notes (Signed)
Established Patient Office Visit  Subjective:  Patient ID: William Gregory, male    DOB: 02/22/1986  Age: 37 y.o. MRN: [id number]  CC:  Chief Complaint  Patient presents with   Follow-up    HPI William Gregory is a 37 y.o. male  has a past medical history of Alcohol abuse, Arthritis, Asthma, Back pain, Chronic kidney disease (CKD), Depression, GERD (gastroesophageal reflux disease), Hypertension, Knee joint pain, left, Neck pain, Pneumonia, PONV (postoperative nausea and vomiting), Psychiatric problem, and Tobacco abuse.  Patient presents for follow-up for his chronic medical conditions.  Patient denies any adverse reactions to current medications   Continues to attend dialysis on Tuesday Thursdays and Saturdays, has a hemodialysis AV fistula on left arm, HD catheter on right chest.  1 pack of cigarettes not last him 4 days, he vapes every day.  He denies current use of cocaine but smokes marijuana and vapes everyday.   Patient denies fever, chills, malaise, shortness of breath, chest pain, nausea, vomiting, diarrhea, dizziness  He is accompanied by his mom  He Is out of his [place] and [place].  He was encouraged to contact neurology office and psychiatrist for medication refill.   Past Medical History:  Diagnosis Date   Alcohol abuse    Arthritis    Asthma    Back pain    Chronic kidney disease (CKD)    Stage 5 Dialysis Tu/Th/Sa   Depression    GERD (gastroesophageal reflux disease)    Hypertension    Knee joint pain, left    Neck pain    Pneumonia    PONV (postoperative nausea and vomiting)    Psychiatric problem    unspecified by patient (receives "injections" bi-weekly)   Tobacco abuse     Past Surgical History:  Procedure Laterality Date   arm surgery     AV FISTULA PLACEMENT Left 07/19/2022   Procedure: LEFT ARM ARTERIOVENOUS (AV) FISTULA CREATION;  Surgeon: Nada Libman, MD;  Location: [facility] OR;  Service: Vascular;  Laterality: Left;   IR FLUORO  GUIDE CV LINE RIGHT  03/31/2022   IR PERC TUN PERIT CATH WO PORT S&I /IMAG  03/31/2022   IR US GUIDE VASC ACCESS RIGHT  03/31/2022    Family History  Problem Relation Age of Onset   Diabetes Mother    Diabetes Father    CVA Father    Dementia Maternal Grandmother    Kidney disease Maternal Grandmother    Heart attack Neg Hx     Social History   Socioeconomic History   Marital status: Single    Spouse name: Not on file   Number of children: Not on file   Years of education: Not on file   Highest education level: Not on file  Occupational History   Not on file  Tobacco Use   Smoking status: Every Day    Packs/day: 1    Types: Cigarettes   Smokeless tobacco: Never  Vaping Use   Vaping Use: Former  Substance and Sexual Activity   Alcohol use: Not Currently    Comment: sober x "months"   Drug use: Not Currently    Types: Marijuana    Comment: cocaine in the past   Sexual activity: Yes  Other Topics Concern   Not on file  Social History Narrative   Right handed   Unemployed/disability   Ninth grade level of education   Social Determinants of Health   Financial Resource Strain: Not on file  Food Insecurity:  No Food Insecurity (03/30/2022)   Hunger Vital Sign    Worried About Running Out of Food in the Last Year: Never true    Ran Out of Food in the Last Year: Never true  Recent Concern: Food Insecurity - Food Insecurity Present (03/20/2022)   Hunger Vital Sign    Worried About Running Out of Food in the Last Year: Sometimes true    Ran Out of Food in the Last Year: Patient declined  Transportation Needs: No Transportation Needs (03/30/2022)   PRAPARE - Administrator, Civil Service (Medical): No    Lack of Transportation (Non-Medical): No  Physical Activity: Not on file  Stress: Not on file  Social Connections: Not on file  Intimate Partner Violence: Unknown (03/20/2022)   Humiliation, Afraid, Rape, and Kick questionnaire    Fear of Current or  Ex-Partner: Patient declined    Emotionally Abused: Not on file    Physically Abused: Not on file    Sexually Abused: Not on file    Outpatient Medications Prior to Visit  Medication Sig Dispense Refill   acetaminophen (TYLENOL) 500 MG tablet Take 500 mg by mouth every 6 (six) hours as needed for moderate pain.     albuterol (VENTOLIN HFA) 108 (90 Base) MCG/ACT inhaler Inhale 2 puffs into the lungs every 4 (four) hours as needed for wheezing or shortness of breath. 18 g 0   chlorhexidine (PERIDEX) 0.12 % solution Use as directed 15 mLs in the mouth or throat 2 (two) times daily. Gargle with 15 mL after meals and at bedtime 118 mL 0   cyclobenzaprine (FLEXERIL) 5 MG tablet Take 1 tablet (5 mg total) by mouth 3 (three) times daily as needed for muscle spasms. 30 tablet 0   fluticasone (FLONASE) 50 MCG/ACT nasal spray Place 2 sprays into both nostrils daily. 16 g 0   amLODipine (NORVASC) 5 MG tablet Take 1 tablet (5 mg total) by mouth daily. 90 tablet 1   citalopram (CELEXA) 20 MG tablet Take 20 mg by mouth every morning.     lidocaine (LIDODERM) 5 % Place 1 patch onto the skin daily. Remove & Discard patch within 12 hours or as directed by MD (Patient not taking: Reported on 10/04/2022) 30 patch 0   paliperidone Palmitate ER (INVEGA TRINZA) 819 MG/2.63ML injection Inject 819 mg into the muscle every 3 (three) months. (Patient not taking: Reported on 10/04/2022)     valbenazine (INGREZZA) 40 MG capsule Take 40 mg by mouth daily. (Patient not taking: Reported on 10/04/2022)     benzonatate (TESSALON PERLES) 100 MG capsule Take 1 capsule (100 mg total) by mouth 3 (three) times daily as needed for cough. (Patient not taking: Reported on 07/17/2022) 30 capsule 0   HYDROcodone-acetaminophen (NORCO/VICODIN) 5-325 MG tablet Take 2 tablets by mouth every 4 (four) hours as needed for severe pain. (Patient not taking: Reported on 10/04/2022) 10 tablet 0   No facility-administered medications prior to visit.     Allergies  Allergen Reactions   Aspirin Swelling   Ibuprofen Rash    ROS Review of Systems  Constitutional:  Negative for activity change, appetite change, chills, diaphoresis, fatigue, fever and unexpected weight change.  HENT:  Negative for congestion, dental problem, drooling and ear discharge.   Eyes:  Negative for pain, discharge, redness and itching.  Respiratory:  Negative for apnea, cough, choking, chest tightness, shortness of breath and wheezing.   Cardiovascular: Negative.  Negative for chest pain, palpitations and leg swelling.  Gastrointestinal:  Negative for abdominal distention, abdominal pain, anal bleeding, blood in stool, constipation, diarrhea and vomiting.  Endocrine: Negative for polydipsia, polyphagia and polyuria.  Genitourinary:  Negative for difficulty urinating, flank pain, frequency and genital sores.  Musculoskeletal: Negative.  Negative for arthralgias, back pain, gait problem and joint swelling.  Skin:  Negative for color change, pallor and rash.  Neurological:  Negative for dizziness, facial asymmetry, light-headedness, numbness and headaches.  Psychiatric/Behavioral:  Negative for agitation, behavioral problems, confusion, hallucinations, self-injury, sleep disturbance and suicidal ideas.       Objective:    Physical Exam Vitals and nursing note reviewed.  Constitutional:      General: He is not in acute distress.    Appearance: Normal appearance. He is obese. He is not ill-appearing, toxic-appearing or diaphoretic.  HENT:     Mouth/Throat:     Mouth: Mucous membranes are moist.     Pharynx: Oropharynx is clear. No oropharyngeal exudate or posterior oropharyngeal erythema.  Eyes:     General: No scleral icterus.       Right eye: No discharge.        Left eye: No discharge.     Extraocular Movements: Extraocular movements intact.     Conjunctiva/sclera: Conjunctivae normal.  Cardiovascular:     Rate and Rhythm: Normal rate and regular  rhythm.     Pulses: Normal pulses.     Heart sounds: Normal heart sounds. No murmur heard.    No friction rub. No gallop.  Pulmonary:     Effort: Pulmonary effort is normal. No respiratory distress.     Breath sounds: Normal breath sounds. No stridor. No wheezing, rhonchi or rales.  Chest:     Chest wall: No tenderness.  Abdominal:     General: There is no distension.     Palpations: Abdomen is soft.     Tenderness: There is no abdominal tenderness. There is no right CVA tenderness, left CVA tenderness or guarding.  Musculoskeletal:        General: No swelling, tenderness, deformity or signs of injury.     Right lower leg: No edema.     Left lower leg: No edema.  Skin:    General: Skin is warm and dry.     Capillary Refill: Capillary refill takes less than 2 seconds.     Coloration: Skin is not jaundiced or pale.     Findings: No bruising, erythema or lesion.     Comments: Right  chest hemodialysis catheter dressing clean and dry  Neurological:     Mental Status: He is alert and oriented to person, place, and time.     Motor: No weakness.     Coordination: Coordination normal.     Gait: Gait normal.  Psychiatric:        Mood and Affect: Mood normal.        Behavior: Behavior normal.        Thought Content: Thought content normal.        Judgment: Judgment normal.     BP 137/80   Pulse 70   Temp 97.7 F (36.5 C)   Ht 5\' 5"  (1.651 m)   Wt 228 lb 9.6 oz (103.7 kg)   SpO2 97%   BMI 38.04 kg/m  Wt Readings from Last 3 Encounters:  10/04/22 228 lb 9.6 oz (103.7 kg)  08/21/22 224 lb (101.6 kg)  07/19/22 250 lb (113.4 kg)    Lab Results  Component Value Date   TSH 3.36 03/20/2022  Lab Results  Component Value Date   WBC 10.7 (H) 06/25/2022   HGB 10.2 (L) 07/19/2022   HCT 30.0 (L) 07/19/2022   MCV 92.3 06/25/2022   PLT 239 06/25/2022   Lab Results  Component Value Date   NA 142 07/19/2022   K 4.3 07/19/2022   CO2 22 06/25/2022   GLUCOSE 82 07/19/2022   BUN  36 (H) 07/19/2022   CREATININE 10.00 (H) 07/19/2022   BILITOT 0.6 03/31/2022   ALKPHOS 79 03/31/2022   AST 16 03/31/2022   ALT 14 03/31/2022   PROT 7.5 03/31/2022   ALBUMIN 3.5 04/04/2022   CALCIUM 9.1 06/25/2022   ANIONGAP 12 06/25/2022   EGFR 6 (L) 05/12/2022   GFR 6.36 (LL) 03/20/2022   Lab Results  Component Value Date   CHOL 144 04/02/2008   Lab Results  Component Value Date   HDL 49 04/02/2008   Lab Results  Component Value Date   LDLCALC 83 04/02/2008   Lab Results  Component Value Date   TRIG 62 04/02/2008   Lab Results  Component Value Date   CHOLHDL 2.9 Ratio 04/02/2008   Lab Results  Component Value Date   HGBA1C 5.6 03/29/2022      Assessment & Plan:   Problem List Items Addressed This Visit       Cardiovascular and Mediastinum   Essential hypertension - Primary    BP Readings from Last 3 Encounters:  10/04/22 137/80  08/21/22 108/66  08/13/22 137/76   HTN Controlled on amlodipine 5 mg daily medication refilled Continue current medications. No changes in management. Discussed DASH diet and dietary sodium restrictions Continue to increase dietary efforts and exercise.  Follow-up in 4 months        Relevant Medications   amLODipine (NORVASC) 5 MG tablet     Endocrine   Hyperparathyroidism, secondary renal (HCC)    Secondary to CKD Will defer management to nephrology Continue hemodialysis as scheduled and maintain close follow-up with nephrologist        Nervous and Auditory   Tardive dyskinesia    Currently not on medication patient encouraged to contact neurology office for medication refill and follow-up        Genitourinary   ESRD (end stage renal disease) (HCC)    Now has  AV fistula in place.  Also has HD catheter on right chest dressing clean and dry. Continue hemodialysis on Tuesday Thursdays and Saturdays as planned        Other   Depression    Well-controlled on Celexa 20 mg daily Medication refilled He denies  SI, HI He was encouraged to maintain close follow-up with psychiatrist      Relevant Medications   citalopram (CELEXA) 20 MG tablet   Marijuana use    Need to avoid marijuana and illicit drugs discussed      Cocaine abuse in remission [facility])    Denies recent use of cocaine patient encouraged to continue to abstain from cocaine abuse      Tobacco abuse counseling    Smokes about 5 cigarettes /day  Asked about quitting: confirms that he/she currently smokes cigarettes Advise to quit smoking: Educated about QUITTING to reduce the risk of cancer, cardio and cerebrovascular disease. Assess willingness: Unwilling to quit at this time, but is working on cutting back. Assist with counseling and pharmacotherapy: Counseled for 5 minutes and literature provided. Arrange for follow up: follow up in 4months and continue to offer help.       Vapes  nicotine containing substance    Need to avoid vaping discussed with the patient       Meds ordered this encounter  Medications   citalopram (CELEXA) 20 MG tablet    Sig: Take 1 tablet (20 mg total) by mouth every morning.    Dispense:  90 tablet    Refill:  1   amLODipine (NORVASC) 5 MG tablet    Sig: Take 1 tablet (5 mg total) by mouth daily.    Dispense:  90 tablet    Refill:  1    Follow-up: Return in about 4 months (around 02/04/2023) for HTN.    Donell Beers, FNP

## 2022-10-04 NOTE — Assessment & Plan Note (Signed)
Need to avoid marijuana and illicit drugs discussed

## 2022-10-04 NOTE — Assessment & Plan Note (Signed)
Currently not on medication patient encouraged to contact neurology office for medication refill and follow-up

## 2022-10-04 NOTE — Patient Instructions (Signed)

## 2022-10-04 NOTE — Assessment & Plan Note (Signed)
Denies recent use of cocaine patient encouraged to continue to abstain from cocaine abuse

## 2022-10-04 NOTE — Assessment & Plan Note (Signed)
Need to avoid vaping discussed with the patient

## 2022-10-04 NOTE — Assessment & Plan Note (Signed)
Smokes about 5 cigarettes /day  Asked about quitting: confirms that he/she currently smokes cigarettes Advise to quit smoking: Educated about QUITTING to reduce the risk of cancer, cardio and cerebrovascular disease. Assess willingness: Unwilling to quit at this time, but is working on cutting back. Assist with counseling and pharmacotherapy: Counseled for 5 minutes and literature provided. Arrange for follow up: follow up in 4months and continue to offer help.

## 2022-10-04 NOTE — Assessment & Plan Note (Signed)
Now has  AV fistula in place.  Also has HD catheter on right chest dressing clean and dry. Continue hemodialysis on Tuesday Thursdays and Saturdays as planned

## 2022-10-04 NOTE — Assessment & Plan Note (Signed)
Secondary to CKD Will defer management to nephrology Continue hemodialysis as scheduled and maintain close follow-up with nephrologist

## 2022-10-04 NOTE — Assessment & Plan Note (Signed)
Well-controlled on Celexa 20 mg daily Medication refilled He denies SI, HI He was encouraged to maintain close follow-up with psychiatrist

## 2022-11-23 NOTE — Progress Notes (Signed)
Assessment/Plan:   1.  Tardive dyskinesia due to [place]  -He really seems most appropriate that his psychiatry managed for tardive dyskinesia since they are managing the Invega, but it has been difficult to get a hold of them.  Restart ingrezza 40 mg daily x 1 week and then increase to previous 80 mg daily.  R/b/se discussed with pt/wife (wife not here previously)  2.  End-stage renal disease  -On hemodialysis  3.  History of cocaine abuse  -Patient denies use currently   -wife here today and reports that she is trying to manage his meds and appts now   Subjective:   William Gregory was seen today in follow up for tardive dyskinesia, due to [place].  My previous records as well as any outside records available were reviewed prior to todays visit.  I have not seen the patient since October, 2023.  At that point, we started him on [place].  At that visit, we quickly identified that the patient had end-stage renal disease and referred him to the hospital.  He checked himself out AMA.  He ultimately did start undergoing outpatient hemodialysis and had a fistula placed July 19, 2022 with Dr. Myra Gianotti.  He presented to primary care May 1 and stated that he was out of his [place] and his Ingrezza.  He has been out of it for about a month.  The wife thinks that the invega HELPS the tremor.  He denies cocaine use - "if you ask me about marijuana, I use that."      CURRENT MEDICATIONS:  Outpatient Encounter Medications as of 11/27/2022  Medication Sig   acetaminophen (TYLENOL) 500 MG tablet Take 500 mg by mouth every 6 (six) hours as needed for moderate pain.   albuterol (VENTOLIN HFA) 108 (90 Base) MCG/ACT inhaler Inhale 2 puffs into the lungs every 4 (four) hours as needed for wheezing or shortness of breath.   amLODipine (NORVASC) 5 MG tablet Take 1 tablet (5 mg total) by mouth daily.   chlorhexidine (PERIDEX) 0.12 % solution Use as directed 15 mLs in the mouth or throat 2 (two) times  daily. Gargle with 15 mL after meals and at bedtime   citalopram (CELEXA) 20 MG tablet Take 1 tablet (20 mg total) by mouth every morning.   cyclobenzaprine (FLEXERIL) 5 MG tablet Take 1 tablet (5 mg total) by mouth 3 (three) times daily as needed for muscle spasms.   fluticasone (FLONASE) 50 MCG/ACT nasal spray Place 2 sprays into both nostrils daily.   valbenazine (INGREZZA) 40 MG capsule Take 40 mg by mouth daily. (Patient not taking: Reported on 10/04/2022)   [DISCONTINUED] lidocaine (LIDODERM) 5 % Place 1 patch onto the skin daily. Remove & Discard patch within 12 hours or as directed by MD (Patient not taking: Reported on 10/04/2022)   [DISCONTINUED] paliperidone Palmitate ER (INVEGA TRINZA) 819 MG/2.63ML injection Inject 819 mg into the muscle every 3 (three) months. (Patient not taking: Reported on 10/04/2022)   No facility-administered encounter medications on file as of 11/27/2022.     Objective:   PHYSICAL EXAMINATION:    VITALS:   Vitals:   11/27/22 0808  BP: 122/80  Pulse: 71  SpO2: 96%  Weight: 217 lb 3.2 oz (98.5 kg)  Height: 5\' 5"  (1.651 m)    GEN:  The patient appears stated age and is in NAD. HEENT:  Normocephalic, atraumatic.  The mucous membranes are moist. The superficial temporal arteries are without ropiness or tenderness. CV:  RRR  Lungs:  CTAB Neck/HEME:  There are no carotid bruits bilaterally.  Neurological examination:  Orientation: The patient is alert and oriented x3.  He is younger than stated age Cranial nerves: There is good facial symmetry.The speech is fluent and clear. Soft palate rises symmetrically and there is no tongue deviation. Hearing is intact to conversational tone. Sensation: Sensation is intact to light touch throughout Motor: Strength is at least antigravity x4.  Movement examination: Tone: There is normal tone in the UE/LE Abnormal movements:  no tremor.  No myoclonus.  No asterixis.  He has some dyskinetic movement in the L leg.  He  has some mild mouth/tongue movement but it stays within the mouth Coordination:  There is no decremation with RAM's. Gait and Station: The patient has no difficulty arising out of a deep-seated chair without the use of the hands. The patient's stride length is good.       Cc:  Donell Beers, FNP

## 2022-11-27 ENCOUNTER — Ambulatory Visit (INDEPENDENT_AMBULATORY_CARE_PROVIDER_SITE_OTHER): Payer: PRIVATE HEALTH INSURANCE | Admitting: Neurology

## 2022-11-27 ENCOUNTER — Encounter: Payer: Self-pay | Admitting: Neurology

## 2022-11-27 VITALS — BP 122/80 | HR 71 | Ht 65.0 in | Wt 217.2 lb

## 2022-11-27 DIAGNOSIS — G2401 Drug induced subacute dyskinesia: Secondary | ICD-10-CM | POA: Diagnosis not present

## 2022-11-27 MED ORDER — VALBENAZINE TOSYLATE 80 MG PO CAPS: 80.0000 mg | ORAL_CAPSULE | Freq: Every day | ORAL | 0 refills | Status: DC

## 2022-11-27 MED ORDER — VALBENAZINE TOSYLATE 40 MG PO CAPS: ORAL_CAPSULE | ORAL | 0 refills | Status: DC

## 2022-11-27 MED ORDER — VALBENAZINE TOSYLATE 80 MG PO CAPS: ORAL_CAPSULE | ORAL | 0 refills | Status: AC

## 2022-11-27 NOTE — Addendum Note (Signed)
Addended by: Ila Mcgill C on: 11/27/2022 11:14 AM   Modules accepted: Orders

## 2022-12-03 ENCOUNTER — Encounter (HOSPITAL_COMMUNITY): Payer: Self-pay

## 2022-12-03 ENCOUNTER — Emergency Department (HOSPITAL_COMMUNITY)
Admission: EM | Admit: 2022-12-03 | Discharge: 2022-12-03 | Disposition: A | Discharge status: Home / Self Care | Payer: Medicaid Other | Attending: Emergency Medicine | Admitting: Emergency Medicine

## 2022-12-03 ENCOUNTER — Other Ambulatory Visit: Payer: Self-pay

## 2022-12-03 DIAGNOSIS — Z4801 Encounter for change or removal of surgical wound dressing: Secondary | ICD-10-CM

## 2022-12-03 NOTE — ED Triage Notes (Signed)
Pt states he just needs a new dressing over hemodialysis catheter on right chest and wants extra bandages for home.

## 2022-12-03 NOTE — Discharge Instructions (Addendum)
Your dressing was changed today in the emergency department.  Continue with your medications as prescribed.  Follow-up with your care team as directed.  Return to the emergency department for experience increasing/worsening symptoms.

## 2022-12-03 NOTE — ED Notes (Signed)
I applied a tegaderm dressing to pt's port and gave him some tegaderms

## 2022-12-03 NOTE — ED Provider Notes (Signed)
[place] EMERGENCY DEPARTMENT AT [facility] Provider Note   CSN: [id number] Arrival date & time: 12/03/22  4098     History  Chief Complaint  Patient presents with   Dressing Change    William Gregory is a 37 y.o. male who presents emergency department with concerns for dressing change onset today. Notes that he needs his dressing changed.  Denies fever, drainage, redness.  The history is provided by the patient. No language interpreter was used.       Home Medications Prior to Admission medications   Medication Sig Start Date End Date Taking? Authorizing Provider  acetaminophen (TYLENOL) 500 MG tablet Take 500 mg by mouth every 6 (six) hours as needed for moderate pain.    [provider]  albuterol (VENTOLIN HFA) 108 (90 Base) MCG/ACT inhaler Inhale 2 puffs into the lungs every 4 (four) hours as needed for wheezing or shortness of breath. 02/12/22   Zenia Resides, MD  amLODipine (NORVASC) 5 MG tablet Take 1 tablet (5 mg total) by mouth daily. 10/04/22   Paseda, Baird Kay, FNP  chlorhexidine (PERIDEX) 0.12 % solution Use as directed 15 mLs in the mouth or throat 2 (two) times daily. Gargle with 15 mL after meals and at bedtime 08/13/22   Lurline Idol, FNP  citalopram (CELEXA) 20 MG tablet Take 1 tablet (20 mg total) by mouth every morning. 10/04/22   Paseda, Baird Kay, FNP  cyclobenzaprine (FLEXERIL) 5 MG tablet Take 1 tablet (5 mg total) by mouth 3 (three) times daily as needed for muscle spasms. 07/10/22   Paseda, Baird Kay, FNP  fluticasone (FLONASE) 50 MCG/ACT nasal spray Place 2 sprays into both nostrils daily. 07/10/22   Paseda, Baird Kay, FNP  valbenazine (INGREZZA) 40 MG capsule Take 40 mg by mouth daily. Patient not taking: Reported on 10/04/2022    [provider]  valbenazine [facility]) 40 MG capsule Samples of this drug were given to the patient, quantity 1, Lot Number [id number] 11/27/22   Tat, Octaviano Batty, DO  valbenazine [facility])  80 MG capsule Samples of this drug were given to the patient, quantity 1, Lot Number [id number] 11/27/22   Tat, Octaviano Batty, DO  valbenazine (INGREZZA) 80 MG capsule Take 1 capsule (80 mg total) by mouth daily. 11/27/22   TatOctaviano Batty, DO      Allergies    Aspirin and Ibuprofen    Review of Systems   Review of Systems  All other systems reviewed and are negative.   Physical Exam Updated Vital Signs BP 127/83   Pulse 88   Temp 98.5 F (36.9 C)   Resp 16   Ht 5\' 5"  (1.651 m)   Wt 98.5 kg   SpO2 97%   BMI 36.14 kg/m  Physical Exam Vitals and nursing note reviewed.  Constitutional:      General: He is not in acute distress.    Appearance: Normal appearance.  Eyes:     General: No scleral icterus.    Extraocular Movements: Extraocular movements intact.  Cardiovascular:     Rate and Rhythm: Normal rate.  Pulmonary:     Effort: Pulmonary effort is normal. No respiratory distress.  Abdominal:     Palpations: Abdomen is soft. There is no mass.     Tenderness: There is no abdominal tenderness.  Musculoskeletal:        General: Normal range of motion.     Cervical back: Neck supple.  Skin:    General: Skin is  warm and dry.     Findings: No rash.     Comments: Catheter site in place to right upper chest wall without surrounding erythema, TTP, or drainage noted.   Neurological:     Mental Status: He is alert.     Sensory: Sensation is intact.     Motor: Motor function is intact.  Psychiatric:        Behavior: Behavior normal.     ED Results / Procedures / Treatments   Labs (all labs ordered are listed, but only abnormal results are displayed) Labs Reviewed - No data to display  EKG None  Radiology No results found.  Procedures Procedures    Medications Ordered in ED Medications - No data to display  ED Course/ Medical Decision Making/ A&P                             Medical Decision Making  Pt presents with concerns for dressing change. Vital signs, pt  afebrile. On exam, pt with Catheter site in place to right upper chest wall without surrounding erythema, TTP, or drainage noted.   Co morbidities that complicate the patient evaluation: CKD, HTN, GERD   Disposition: Presenting suspicious for dressing change.  Doubt concerns at this time for cellulitis or abscess. After consideration of the diagnostic results and the patients response to treatment, I feel that the patient would benefit from Discharge home.  Patient is dressing changed in the emergency department.  Patient supplied with additional dressing supplies.  Instructed patient to follow-up with care team as directed.  Supportive care measures and strict return precautions discussed with patient at bedside. Pt acknowledges and verbalizes understanding. Pt appears safe for discharge. Follow up as indicated in discharge paperwork.    This chart was dictated using voice recognition software, Dragon. Despite the best efforts of this provider to proofread and correct errors, errors may still occur which can change documentation meaning.   Final Clinical Impression(s) / ED Diagnoses Final diagnoses:  Dressing change or removal, surgical wound    Rx / DC Orders ED Discharge Orders     None         Tahmid Stonehocker A, PA-C 12/03/22 1610    Rozelle Logan, DO 12/03/22 1440

## 2022-12-04 ENCOUNTER — Telehealth: Payer: Self-pay

## 2022-12-04 NOTE — Transitions of Care (Post Inpatient/ED Visit) (Signed)
   12/04/2022  Name: William Gregory MRN: [id number] DOB: 1985-07-15  Today's TOC FU Call Status: Today's TOC FU Call Status:: Unsuccessul Call (1st Attempt) Unsuccessful Call (1st Attempt) Date: 12/04/22  Attempted to reach the patient regarding the most recent Inpatient/ED visit.  Follow Up Plan: Additional outreach attempts will be made to reach the patient to complete the Transitions of Care (Post Inpatient/ED visit) call.   Signature Renelda Loma RMA

## 2022-12-05 ENCOUNTER — Telehealth: Payer: Self-pay

## 2022-12-05 NOTE — Transitions of Care (Post Inpatient/ED Visit) (Cosign Needed)
12/05/2022  Name: William Gregory MRN: [id number] DOB: 1985-12-04  Today's TOC FU Call Status: Today's TOC FU Call Status:: Successful TOC FU Call Competed TOC FU Call Complete Date: 12/05/22  Transition Care Management Follow-up Telephone Call Discharge Facility: [facility]) Type of Discharge: Emergency Department Reason for ED Visit: Other: How have you been since you were released from the hospital?: Better Any questions or concerns?: No  Items Reviewed: Did you receive and understand the discharge instructions provided?: Yes Medications obtained,verified, and reconciled?: Yes (Medications Reviewed) Any new allergies since your discharge?: No Dietary orders reviewed?: NA Do you have support at home?: Yes People in Home: significant other  Medications Reviewed Today: Medications Reviewed Today     Reviewed by Renelda Loma, RMA (Registered Medical Assistant) on 12/05/22 at [phone]  Med List Status: <None>   Medication Order Taking? Sig Documenting Provider Last Dose Status Informant  acetaminophen (TYLENOL) 500 MG tablet [id number] Yes Take 500 mg by mouth every 6 (six) hours as needed for moderate pain. [provider] Taking Active Self, Spouse/Significant Other           Med Note Sherilyn Cooter, [facility]   Fri May 12, 2022  2:02 PM) prn  albuterol (VENTOLIN HFA) 108 (90 Base) MCG/ACT inhaler [id number] Yes Inhale 2 puffs into the lungs every 4 (four) hours as needed for wheezing or shortness of breath. Zenia Resides, MD Taking Active Self, Spouse/Significant Other           Med Note Sherilyn Cooter, [facility]   Fri May 12, 2022  2:02 PM) Prn   amLODipine (NORVASC) 5 MG tablet [id number] Yes Take 1 tablet (5 mg total) by mouth daily. Donell Beers, FNP Taking Active   chlorhexidine (PERIDEX) 0.12 % solution [id number] Yes Use as directed 15 mLs in the mouth or throat 2 (two) times daily. Gargle with 15 mL after meals and at bedtime Lurline Idol, FNP Taking Active    citalopram (CELEXA) 20 MG tablet [id number] Yes Take 1 tablet (20 mg total) by mouth every morning. Donell Beers, FNP Taking Active   cyclobenzaprine (FLEXERIL) 5 MG tablet [id number] Yes Take 1 tablet (5 mg total) by mouth 3 (three) times daily as needed for muscle spasms. Donell Beers, FNP Taking Active Self, Spouse/Significant Other           Med Note Junius Finner, GEORGINA   Wed Oct 04, 2022  1:47 PM) PRN   fluticasone (FLONASE) 50 MCG/ACT nasal spray [id number] Yes Place 2 sprays into both nostrils daily. Donell Beers, FNP Taking Active Self, Spouse/Significant Other  valbenazine (INGREZZA) 40 MG capsule [id number] No Take 40 mg by mouth daily.  Patient not taking: Reported on 10/04/2022   [provider] Not Taking Active Self, Spouse/Significant Other           Med Note Kimber Relic Jul 17, 2022  3:55 PM) Get from [place]  valbenazine [facility]) 40 MG capsule [id number] Yes Samples of this drug were given to the patient, quantity 1, Lot Number [id number] Tat, Octaviano Batty, DO Taking Active   valbenazine [facility]) 80 MG capsule [id number] Yes Samples of this drug were given to the patient, quantity 1, Lot Number [id number] Tat, Octaviano Batty, DO Taking Active   valbenazine [facility]) 80 MG capsule [id number] Yes Take 1 capsule (80 mg total) by mouth daily. Vladimir Faster, DO Taking Active             Home Care and Equipment/Supplies: Were Home  Health Services Ordered?: NA Any new equipment or medical supplies ordered?: NA  Functional Questionnaire: Do you need assistance with bathing/showering or dressing?: No Do you need assistance with meal preparation?: No Do you need assistance with eating?: No Do you need assistance with getting out of bed/getting out of a chair/moving?: No Do you have difficulty managing or taking your medications?: No  Follow up appointments reviewed: PCP Follow-up appointment confirmed?: Yes Date of PCP follow-up  appointment?: 12/20/22 Follow-up Provider: Astrid Drafts Renelda Loma RMA

## 2022-12-11 ENCOUNTER — Telehealth: Payer: Self-pay

## 2022-12-11 NOTE — Telephone Encounter (Signed)
Patient called and claims the ingrezza he can't swallow then he said it isn't working patient wants another follow up to see Dr. Arbutus Leas about a different med

## 2022-12-11 NOTE — Telephone Encounter (Signed)
Called patients wife and left voicemail ?

## 2022-12-11 NOTE — Telephone Encounter (Signed)
Called patient back no answer 

## 2022-12-12 NOTE — Telephone Encounter (Signed)
I tried patient and spouse again no answer

## 2022-12-20 ENCOUNTER — Inpatient Hospital Stay: Payer: Self-pay | Admitting: Nurse Practitioner

## 2022-12-20 ENCOUNTER — Telehealth: Payer: Self-pay | Admitting: Pharmacy Technician

## 2022-12-20 ENCOUNTER — Other Ambulatory Visit (HOSPITAL_COMMUNITY): Payer: Self-pay

## 2022-12-20 NOTE — Telephone Encounter (Signed)
Pharmacy Patient Advocate Encounter   Received notification from Physician's Office that prior authorization for [facility] 80MG  is required/requested.   Insurance verification completed.   The patient is insured through [employer] [place] .   Per test claim: PA submitted to [facility] via CoverMyMeds Key/confirmation #/EOC [id number] Status is pending

## 2022-12-25 ENCOUNTER — Other Ambulatory Visit (HOSPITAL_COMMUNITY): Payer: Self-pay

## 2022-12-25 ENCOUNTER — Other Ambulatory Visit: Payer: Self-pay

## 2022-12-25 NOTE — Telephone Encounter (Signed)
Called  [employer] and verified that this patient has received the prior auth needed for [employer] AND THEY ARE TRYING TO CONECCT TO [employer]

## 2022-12-25 NOTE — Telephone Encounter (Signed)
Pharmacy Patient Advocate Encounter  Received notification from [facility] that Prior Authorization for [facility] 80MG  capsules has been APPROVED from 12-20-2022 to 03-05-2023.Marland Kitchen  PA #/Case ID/Reference #: [id number]  Filled 12-21-2022

## 2023-01-01 ENCOUNTER — Other Ambulatory Visit: Payer: Self-pay | Admitting: Nurse Practitioner

## 2023-01-01 ENCOUNTER — Telehealth: Payer: Self-pay | Admitting: Nurse Practitioner

## 2023-01-01 DIAGNOSIS — I10 Essential (primary) hypertension: Secondary | ICD-10-CM

## 2023-01-01 MED ORDER — AMLODIPINE BESYLATE 5 MG PO TABS: 5.0000 mg | ORAL_TABLET | Freq: Every day | ORAL | 1 refills | Status: DC

## 2023-01-01 NOTE — Telephone Encounter (Signed)
Caller & Relationship to patient:  MRN #  [id number]   Call Back Number:   Date of Last Office Visit: 12/20/2022     Date of Next Office Visit: 02/05/2023    Medication(s) to be Refilled: Amlodipine  Preferred Pharmacy: [facility] [address]  ** Please notify patient to allow 48-72 hours to process** **Let patient know to contact pharmacy at the end of the day to make sure medication is ready. ** **If patient has not been seen in a year or longer, book an appointment **Advise to use MyChart for refill requests OR to contact their pharmacy

## 2023-02-05 ENCOUNTER — Ambulatory Visit: Payer: Self-pay | Admitting: Nurse Practitioner

## 2023-02-12 ENCOUNTER — Ambulatory Visit: Payer: Self-pay | Admitting: Nurse Practitioner

## 2023-02-14 ENCOUNTER — Ambulatory Visit: Payer: Self-pay | Admitting: Nurse Practitioner

## 2023-02-21 ENCOUNTER — Ambulatory Visit: Payer: Self-pay | Admitting: Nurse Practitioner

## 2023-02-26 ENCOUNTER — Telehealth: Payer: Self-pay | Admitting: Neurology

## 2023-02-26 NOTE — Telephone Encounter (Signed)
Pharmacy called in needing prior auth for ingrezza

## 2023-03-01 ENCOUNTER — Other Ambulatory Visit (HOSPITAL_COMMUNITY): Payer: Self-pay

## 2023-03-02 ENCOUNTER — Ambulatory Visit: Payer: Self-pay | Admitting: Nurse Practitioner

## 2023-03-02 NOTE — Telephone Encounter (Signed)
Left message with the after hour service on 03-02-23 at 12:26 pm   Caller states that she is calling from [facility] is calling to confirm status of the RX ordered faxed    They do have a prior auth that is expiring  soon

## 2023-03-05 ENCOUNTER — Emergency Department (HOSPITAL_COMMUNITY)
Admission: EM | Admit: 2023-03-05 | Discharge: 2023-03-05 | Discharge status: Against Medical Advice | Payer: MEDICAID | Attending: Emergency Medicine | Admitting: Emergency Medicine

## 2023-03-05 ENCOUNTER — Other Ambulatory Visit: Payer: Self-pay

## 2023-03-05 DIAGNOSIS — Z5321 Procedure and treatment not carried out due to patient leaving prior to being seen by health care provider: Secondary | ICD-10-CM | POA: Diagnosis not present

## 2023-03-05 DIAGNOSIS — R251 Tremor, unspecified: Secondary | ICD-10-CM | POA: Diagnosis present

## 2023-03-05 LAB — CBC WITH DIFFERENTIAL/PLATELET
Abs Immature Granulocytes: 0.02 10*3/uL (ref 0.00–0.07)
Basophils Absolute: 0.1 10*3/uL (ref 0.0–0.1)
Basophils Relative: 1 %
Eosinophils Absolute: 0.2 10*3/uL (ref 0.0–0.5)
Eosinophils Relative: 2 %
HCT: 32.4 % — ABNORMAL LOW (ref 39.0–52.0)
Hemoglobin: 10.3 g/dL — ABNORMAL LOW (ref 13.0–17.0)
Immature Granulocytes: 0 %
Lymphocytes Relative: 24 %
Lymphs Abs: 1.8 10*3/uL (ref 0.7–4.0)
MCH: 29.3 pg (ref 26.0–34.0)
MCHC: 31.8 g/dL (ref 30.0–36.0)
MCV: 92 fL (ref 80.0–100.0)
Monocytes Absolute: 0.6 10*3/uL (ref 0.1–1.0)
Monocytes Relative: 8 %
Neutro Abs: 4.9 10*3/uL (ref 1.7–7.7)
Neutrophils Relative %: 65 %
Platelets: 212 10*3/uL (ref 150–400)
RBC: 3.52 MIL/uL — ABNORMAL LOW (ref 4.22–5.81)
RDW: 15.4 % (ref 11.5–15.5)
WBC: 7.5 10*3/uL (ref 4.0–10.5)
nRBC: 0 % (ref 0.0–0.2)

## 2023-03-05 LAB — COMPREHENSIVE METABOLIC PANEL
ALT: 16 U/L (ref 0–44)
AST: 15 U/L (ref 15–41)
Albumin: 3.8 g/dL (ref 3.5–5.0)
Alkaline Phosphatase: 85 U/L (ref 38–126)
Anion gap: 14 (ref 5–15)
BUN: 49 mg/dL — ABNORMAL HIGH (ref 6–20)
CO2: 18 mmol/L — ABNORMAL LOW (ref 22–32)
Calcium: 9 mg/dL (ref 8.9–10.3)
Chloride: 106 mmol/L (ref 98–111)
Creatinine, Ser: 10.53 mg/dL — ABNORMAL HIGH (ref 0.61–1.24)
GFR, Estimated: 6 mL/min — ABNORMAL LOW (ref >60)
Glucose, Bld: 85 mg/dL (ref 70–99)
Potassium: 4 mmol/L (ref 3.5–5.1)
Sodium: 138 mmol/L (ref 135–145)
Total Bilirubin: 0.6 mg/dL (ref 0.3–1.2)
Total Protein: 6.9 g/dL (ref 6.5–8.1)

## 2023-03-05 NOTE — ED Notes (Signed)
Pt was called 3 different times and No answer.

## 2023-03-05 NOTE — ED Triage Notes (Signed)
Patient reports tremors at both arms/mouth since yesterday , missed his hemodialysis yesterday , respirations unlabored /alert and oriented .

## 2023-03-05 NOTE — ED Notes (Signed)
Pt was called  x4 ,No answer

## 2023-03-06 DIAGNOSIS — F419 Anxiety disorder, unspecified: Secondary | ICD-10-CM | POA: Diagnosis not present

## 2023-03-06 DIAGNOSIS — F209 Schizophrenia, unspecified: Secondary | ICD-10-CM | POA: Diagnosis not present

## 2023-03-08 NOTE — Telephone Encounter (Signed)
Sent to PA team

## 2023-03-08 NOTE — Telephone Encounter (Signed)
Left message with the after hour service on 03-08-23 at 12:25 pm   Caller states that she is [place] with [facility]  and would like a call back at [phone].   PA status

## 2023-03-14 ENCOUNTER — Telehealth: Payer: Self-pay | Admitting: Neurology

## 2023-03-14 ENCOUNTER — Telehealth: Payer: Self-pay | Admitting: Pharmacy Technician

## 2023-03-14 ENCOUNTER — Other Ambulatory Visit (HOSPITAL_COMMUNITY): Payer: Self-pay

## 2023-03-14 NOTE — Telephone Encounter (Signed)
Pharmacy Patient Advocate Encounter   Received notification from Patient Advice Request messages that prior authorization for [facility] 80MG  is required/requested.   Insurance verification completed.   The patient is insured through [facility] .   Per test claim: PA required; PA submitted to [facility] Medicaid via CoverMyMeds Key/confirmation #/EOC [id number] Status is pending

## 2023-03-14 NOTE — Telephone Encounter (Signed)
Pharmacy called in and left a message. Called about PA for Ingrezza. There is one on cover my meds and she sent [place] one. Key Code: [id number]

## 2023-03-14 NOTE — Telephone Encounter (Signed)
Called [facility] to let them know I have reached out to [employer] and let them know I have received several calls from them and from the patient and his wife and that the PA team is working hard on it. Patient is not out of meds and has a full unopened bottle of meds at home right now

## 2023-03-14 NOTE — Telephone Encounter (Signed)
Checked on PA with [place] she has received and initiated it. Told patient to get laxative at [facility] or [facility]

## 2023-03-14 NOTE — Telephone Encounter (Signed)
Pharmacy Patient Advocate Encounter  Received notification from [facility] that Prior Authorization for [facility] 80MG  has been APPROVED from 10.9.24 to 1.31.25. Ran test claim, Copay is $4. This test claim was processed through [facility]- copay amounts may vary at other pharmacies due to pharmacy/plan contracts, or as the patient moves through the different stages of their insurance plan.   PA #/Case ID/Reference #:

## 2023-03-14 NOTE — Telephone Encounter (Signed)
Patient and his gf came into the office to check on the PA for his medication, he states he has been trying to get in touch with the doctor and nurse. Patient is also needing a X for a Laxative

## 2023-04-18 ENCOUNTER — Telehealth: Payer: Self-pay | Admitting: Neurology

## 2023-04-18 DIAGNOSIS — F209 Schizophrenia, unspecified: Secondary | ICD-10-CM | POA: Diagnosis not present

## 2023-04-18 DIAGNOSIS — F419 Anxiety disorder, unspecified: Secondary | ICD-10-CM | POA: Diagnosis not present

## 2023-04-18 NOTE — Telephone Encounter (Signed)
Pt's mother called in stating the pt's shaking has gotten really bad and the medication is not working. He has also started drooling. The pt saw different doctor today and they are afraid it could be Parkinson's.

## 2023-04-19 NOTE — Telephone Encounter (Signed)
Called and left voicemail message.

## 2023-04-20 ENCOUNTER — Ambulatory Visit: Payer: Self-pay | Admitting: Nurse Practitioner

## 2023-04-20 NOTE — Telephone Encounter (Signed)
Called and spoke to patients mom about the psych drugs and the difference in meds

## 2023-06-05 NOTE — Progress Notes (Deleted)
 Assessment/Plan:   1.  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  Restart ingrezza  40 mg daily x 1 week and then increase to previous 80 mg daily.  R/b/se discussed with pt/wife (wife not here previously)  2.  End-stage renal disease  -On hemodialysis  3.  History of cocaine  abuse  -Patient denies use currently   -wife here today and reports that she is trying to manage his meds and appts now   Subjective:   William Gregory was seen today in follow up for tardive dyskinesia, due to Invega.  My previous records as well as any outside records available were reviewed prior to todays visit.  We restarted his Ingrezza  last visit for tardive dyskinesia due to his Invega.  He called me only a few weeks later and stated that the medication was not working.  We tried to call them back multiple times, but nobody answered.  The same thing happened when the pharmacy tried to ship the patient's medication.  They called them multiple times for permission to ship, without a response back.  He has no showed his primary care visit in November.  He reports he is still on dialysis.    CURRENT MEDICATIONS:  Outpatient Encounter Medications as of 06/08/2023  Medication Sig   acetaminophen  (TYLENOL ) 500 MG tablet Take 500 mg by mouth every 6 (six) hours as needed for moderate pain.   albuterol  (VENTOLIN  HFA) 108 (90 Base) MCG/ACT inhaler Inhale 2 puffs into the lungs every 4 (four) hours as needed for wheezing or shortness of breath.   amLODipine  (NORVASC ) 5 MG tablet Take 1 tablet (5 mg total) by mouth daily.   chlorhexidine  (PERIDEX ) 0.12 % solution Use as directed 15 mLs in the mouth or throat 2 (two) times daily. Gargle with 15 mL after meals and at bedtime   citalopram  (CELEXA ) 20 MG tablet Take 1 tablet (20 mg total) by mouth every morning.    cyclobenzaprine  (FLEXERIL ) 5 MG tablet Take 1 tablet (5 mg total) by mouth 3 (three) times daily as needed for muscle spasms.   fluticasone  (FLONASE ) 50 MCG/ACT nasal spray Place 2 sprays into both nostrils daily.   valbenazine  (INGREZZA ) 40 MG capsule Take 40 mg by mouth daily. (Patient not taking: Reported on 10/04/2022)   valbenazine  (INGREZZA ) 40 MG capsule Samples of this drug were given to the patient, quantity 1, Lot Number [id number]   valbenazine  (INGREZZA ) 80 MG capsule Samples of this drug were given to the patient, quantity 1, Lot Number [id number]   valbenazine  (INGREZZA ) 80 MG capsule Take 1 capsule (80 mg total) by mouth daily.   No facility-administered encounter medications on file as of 06/08/2023.     Objective:   PHYSICAL EXAMINATION:    VITALS:   There were no vitals filed for this visit.   GEN:  The patient appears stated age and is in NAD. HEENT:  Normocephalic, atraumatic.  The mucous membranes are moist. The superficial temporal arteries are without ropiness or tenderness. CV:  RRR Lungs:  CTAB Neck/HEME:  There are no carotid bruits bilaterally.  Neurological examination:  Orientation: The patient is alert and oriented x3.  He is younger than stated age Cranial nerves: There is good facial symmetry.The speech is fluent and clear. Soft palate rises symmetrically and there is no tongue deviation. Hearing is  intact to conversational tone. Sensation: Sensation is intact to light touch throughout Motor: Strength is at least antigravity x4.  Movement examination: Tone: There is normal tone in the UE/LE Abnormal movements:  no tremor.  No myoclonus.  No asterixis.  He has some dyskinetic movement in the L leg.  He has some mild mouth/tongue movement but it stays within the mouth Coordination:  There is no decremation with RAM's. Gait and Station: The patient has no difficulty arising out of a deep-seated chair without the use of the hands. The patient's stride length  is good.       Cc:  Paseda, Folashade R, FNP

## 2023-06-08 ENCOUNTER — Ambulatory Visit: Payer: Commercial Managed Care - HMO | Admitting: Neurology

## 2023-06-08 ENCOUNTER — Encounter: Payer: Self-pay | Admitting: Neurology

## 2023-07-02 ENCOUNTER — Other Ambulatory Visit: Payer: Self-pay | Admitting: Neurology

## 2023-07-02 DIAGNOSIS — G2401 Drug induced subacute dyskinesia: Secondary | ICD-10-CM

## 2023-07-03 DIAGNOSIS — F209 Schizophrenia, unspecified: Secondary | ICD-10-CM | POA: Diagnosis not present

## 2023-07-25 ENCOUNTER — Other Ambulatory Visit: Payer: Self-pay | Admitting: Nurse Practitioner

## 2023-07-25 DIAGNOSIS — I10 Essential (primary) hypertension: Secondary | ICD-10-CM

## 2023-08-02 ENCOUNTER — Emergency Department (HOSPITAL_COMMUNITY): Payer: No Typology Code available for payment source

## 2023-08-02 ENCOUNTER — Inpatient Hospital Stay (HOSPITAL_COMMUNITY)
Admission: EM | Admit: 2023-08-02 | Discharge: 2023-08-02 | DRG: 100 | Discharge status: Against Medical Advice | Payer: No Typology Code available for payment source | Attending: Internal Medicine | Admitting: Internal Medicine

## 2023-08-02 DIAGNOSIS — Z820 Family history of epilepsy and other diseases of the nervous system: Secondary | ICD-10-CM

## 2023-08-02 DIAGNOSIS — F17210 Nicotine dependence, cigarettes, uncomplicated: Secondary | ICD-10-CM | POA: Diagnosis present

## 2023-08-02 DIAGNOSIS — N186 End stage renal disease: Secondary | ICD-10-CM | POA: Diagnosis not present

## 2023-08-02 DIAGNOSIS — Z6830 Body mass index (BMI) 30.0-30.9, adult: Secondary | ICD-10-CM | POA: Diagnosis not present

## 2023-08-02 DIAGNOSIS — Z992 Dependence on renal dialysis: Secondary | ICD-10-CM | POA: Diagnosis not present

## 2023-08-02 DIAGNOSIS — J45909 Unspecified asthma, uncomplicated: Secondary | ICD-10-CM | POA: Diagnosis present

## 2023-08-02 DIAGNOSIS — R569 Unspecified convulsions: Principal | ICD-10-CM

## 2023-08-02 DIAGNOSIS — G9341 Metabolic encephalopathy: Secondary | ICD-10-CM | POA: Diagnosis present

## 2023-08-02 DIAGNOSIS — Z91158 Patient's noncompliance with renal dialysis for other reason: Secondary | ICD-10-CM

## 2023-08-02 DIAGNOSIS — D631 Anemia in chronic kidney disease: Secondary | ICD-10-CM | POA: Diagnosis present

## 2023-08-02 DIAGNOSIS — N2581 Secondary hyperparathyroidism of renal origin: Secondary | ICD-10-CM | POA: Diagnosis present

## 2023-08-02 DIAGNOSIS — M6282 Rhabdomyolysis: Secondary | ICD-10-CM

## 2023-08-02 DIAGNOSIS — R4182 Altered mental status, unspecified: Secondary | ICD-10-CM | POA: Diagnosis present

## 2023-08-02 DIAGNOSIS — F1490 Cocaine use, unspecified, uncomplicated: Secondary | ICD-10-CM

## 2023-08-02 DIAGNOSIS — Z91199 Patient's noncompliance with other medical treatment and regimen due to unspecified reason: Secondary | ICD-10-CM

## 2023-08-02 DIAGNOSIS — F19939 Other psychoactive substance use, unspecified with withdrawal, unspecified: Secondary | ICD-10-CM | POA: Diagnosis present

## 2023-08-02 DIAGNOSIS — Z5329 Procedure and treatment not carried out because of patient's decision for other reasons: Secondary | ICD-10-CM | POA: Diagnosis present

## 2023-08-02 DIAGNOSIS — G928 Other toxic encephalopathy: Secondary | ICD-10-CM | POA: Diagnosis present

## 2023-08-02 DIAGNOSIS — R7989 Other specified abnormal findings of blood chemistry: Secondary | ICD-10-CM

## 2023-08-02 DIAGNOSIS — E872 Acidosis, unspecified: Secondary | ICD-10-CM | POA: Diagnosis present

## 2023-08-02 DIAGNOSIS — F259 Schizoaffective disorder, unspecified: Secondary | ICD-10-CM | POA: Diagnosis present

## 2023-08-02 DIAGNOSIS — G2401 Drug induced subacute dyskinesia: Secondary | ICD-10-CM | POA: Diagnosis present

## 2023-08-02 DIAGNOSIS — R410 Disorientation, unspecified: Secondary | ICD-10-CM

## 2023-08-02 DIAGNOSIS — K219 Gastro-esophageal reflux disease without esophagitis: Secondary | ICD-10-CM | POA: Diagnosis present

## 2023-08-02 DIAGNOSIS — F1410 Cocaine abuse, uncomplicated: Secondary | ICD-10-CM | POA: Diagnosis present

## 2023-08-02 DIAGNOSIS — I120 Hypertensive chronic kidney disease with stage 5 chronic kidney disease or end stage renal disease: Secondary | ICD-10-CM | POA: Diagnosis present

## 2023-08-02 DIAGNOSIS — Z79899 Other long term (current) drug therapy: Secondary | ICD-10-CM | POA: Diagnosis not present

## 2023-08-02 HISTORY — DX: Unspecified asthma, uncomplicated: J45.909

## 2023-08-02 MED ORDER — LORAZEPAM 2 MG/ML IJ SOLN
1.0000 mg | Freq: Once | INTRAMUSCULAR | Status: AC
  Administered 2023-08-02: 1 mg via INTRAVENOUS
  Filled 2023-08-02: qty 1

## 2023-08-02 MED ORDER — CALCITRIOL 0.5 MCG PO CAPS
1.2500 ug | ORAL_CAPSULE | ORAL | Status: DC
  Administered 2023-08-02: 1.25 ug via ORAL
  Filled 2023-08-02: qty 1

## 2023-08-02 MED ORDER — HEPARIN SODIUM (PORCINE) 1000 UNIT/ML DIALYSIS: 1000.0000 [IU] | INTRAMUSCULAR | Status: DC | PRN

## 2023-08-02 MED ORDER — LORAZEPAM 2 MG/ML IJ SOLN
1.0000 mg | Freq: Once | INTRAMUSCULAR | Status: AC
  Administered 2023-08-02: 1 mg via INTRAVENOUS

## 2023-08-02 MED ORDER — PENTAFLUOROPROP-TETRAFLUOROETH EX AERO: 1.0000 | INHALATION_SPRAY | CUTANEOUS | Status: DC | PRN

## 2023-08-02 MED ORDER — LIDOCAINE-PRILOCAINE 2.5-2.5 % EX CREA: 1.0000 | TOPICAL_CREAM | CUTANEOUS | Status: DC | PRN

## 2023-08-02 MED ORDER — LORAZEPAM 2 MG/ML IJ SOLN
INTRAMUSCULAR | Status: AC
  Filled 2023-08-02: qty 1

## 2023-08-02 MED ORDER — ALTEPLASE 2 MG IJ SOLR: 2.0000 mg | Freq: Once | INTRAMUSCULAR | Status: DC | PRN

## 2023-08-02 MED ORDER — HEPARIN SODIUM (PORCINE) 1000 UNIT/ML DIALYSIS
3400.0000 [IU] | Freq: Once | INTRAMUSCULAR | Status: DC
  Filled 2023-08-02: qty 4

## 2023-08-02 MED ORDER — HEPARIN SODIUM (PORCINE) 5000 UNIT/ML IJ SOLN: 5000.0000 [IU] | Freq: Three times a day (TID) | INTRAMUSCULAR | Status: DC

## 2023-08-02 MED ORDER — ANTICOAGULANT SODIUM CITRATE 4% (200MG/5ML) IV SOLN: 5.0000 mL | Status: DC | PRN

## 2023-08-03 ENCOUNTER — Encounter (HOSPITAL_COMMUNITY): Payer: Self-pay

## 2023-08-20 ENCOUNTER — Ambulatory Visit: Payer: Self-pay | Admitting: Nurse Practitioner

## 2023-08-31 ENCOUNTER — Other Ambulatory Visit: Payer: Self-pay | Admitting: Nurse Practitioner

## 2023-08-31 ENCOUNTER — Emergency Department (HOSPITAL_COMMUNITY): Payer: MEDICAID

## 2023-08-31 ENCOUNTER — Observation Stay (HOSPITAL_COMMUNITY): Payer: MEDICAID

## 2023-08-31 ENCOUNTER — Inpatient Hospital Stay (HOSPITAL_COMMUNITY)
Admission: EM | Admit: 2023-08-31 | Discharge: 2023-09-02 | DRG: 917 | Disposition: A | Discharge status: Home / Self Care | Payer: MEDICAID | Attending: Internal Medicine | Admitting: Internal Medicine

## 2023-08-31 DIAGNOSIS — Z5900 Homelessness unspecified: Secondary | ICD-10-CM

## 2023-08-31 DIAGNOSIS — Z992 Dependence on renal dialysis: Secondary | ICD-10-CM

## 2023-08-31 DIAGNOSIS — R051 Acute cough: Secondary | ICD-10-CM

## 2023-08-31 DIAGNOSIS — I10 Essential (primary) hypertension: Secondary | ICD-10-CM | POA: Diagnosis not present

## 2023-08-31 DIAGNOSIS — F17200 Nicotine dependence, unspecified, uncomplicated: Secondary | ICD-10-CM | POA: Diagnosis present

## 2023-08-31 DIAGNOSIS — F259 Schizoaffective disorder, unspecified: Secondary | ICD-10-CM | POA: Diagnosis present

## 2023-08-31 DIAGNOSIS — G40509 Epileptic seizures related to external causes, not intractable, without status epilepticus: Secondary | ICD-10-CM | POA: Diagnosis present

## 2023-08-31 DIAGNOSIS — R569 Unspecified convulsions: Secondary | ICD-10-CM

## 2023-08-31 DIAGNOSIS — Z91148 Patient's other noncompliance with medication regimen for other reason: Secondary | ICD-10-CM

## 2023-08-31 DIAGNOSIS — G2401 Drug induced subacute dyskinesia: Secondary | ICD-10-CM | POA: Diagnosis present

## 2023-08-31 DIAGNOSIS — F1410 Cocaine abuse, uncomplicated: Secondary | ICD-10-CM | POA: Diagnosis present

## 2023-08-31 DIAGNOSIS — S42031A Displaced fracture of lateral end of right clavicle, initial encounter for closed fracture: Secondary | ICD-10-CM | POA: Diagnosis present

## 2023-08-31 DIAGNOSIS — M25512 Pain in left shoulder: Secondary | ICD-10-CM

## 2023-08-31 DIAGNOSIS — R4182 Altered mental status, unspecified: Principal | ICD-10-CM

## 2023-08-31 DIAGNOSIS — T405X1A Poisoning by cocaine, accidental (unintentional), initial encounter: Principal | ICD-10-CM | POA: Diagnosis present

## 2023-08-31 DIAGNOSIS — N2581 Secondary hyperparathyroidism of renal origin: Secondary | ICD-10-CM | POA: Diagnosis present

## 2023-08-31 DIAGNOSIS — N186 End stage renal disease: Secondary | ICD-10-CM | POA: Diagnosis not present

## 2023-08-31 DIAGNOSIS — N250 Renal osteodystrophy: Secondary | ICD-10-CM | POA: Diagnosis present

## 2023-08-31 DIAGNOSIS — I120 Hypertensive chronic kidney disease with stage 5 chronic kidney disease or end stage renal disease: Secondary | ICD-10-CM | POA: Diagnosis present

## 2023-08-31 DIAGNOSIS — Z91158 Patient's noncompliance with renal dialysis for other reason: Secondary | ICD-10-CM

## 2023-08-31 DIAGNOSIS — D631 Anemia in chronic kidney disease: Secondary | ICD-10-CM | POA: Diagnosis present

## 2023-08-31 DIAGNOSIS — Z91199 Patient's noncompliance with other medical treatment and regimen due to unspecified reason: Secondary | ICD-10-CM

## 2023-08-31 LAB — HIV ANTIBODY (ROUTINE TESTING W REFLEX): HIV Screen 4th Generation wRfx: NONREACTIVE

## 2023-08-31 LAB — CBC WITH DIFFERENTIAL/PLATELET
Abs Immature Granulocytes: 0.02 10*3/uL (ref 0.00–0.07)
Basophils Absolute: 0.1 10*3/uL (ref 0.0–0.1)
Basophils Relative: 1 %
Eosinophils Absolute: 0.1 10*3/uL (ref 0.0–0.5)
Eosinophils Relative: 2 %
HCT: 31.9 % — ABNORMAL LOW (ref 39.0–52.0)
Hemoglobin: 10 g/dL — ABNORMAL LOW (ref 13.0–17.0)
Immature Granulocytes: 0 %
Lymphocytes Relative: 19 %
Lymphs Abs: 1.4 10*3/uL (ref 0.7–4.0)
MCH: 29.6 pg (ref 26.0–34.0)
MCHC: 31.3 g/dL (ref 30.0–36.0)
MCV: 94.4 fL (ref 80.0–100.0)
Monocytes Absolute: 0.6 10*3/uL (ref 0.1–1.0)
Monocytes Relative: 8 %
Neutro Abs: 5.2 10*3/uL (ref 1.7–7.7)
Neutrophils Relative %: 70 %
Platelets: 210 10*3/uL (ref 150–400)
RBC: 3.38 MIL/uL — ABNORMAL LOW (ref 4.22–5.81)
RDW: 15.3 % (ref 11.5–15.5)
WBC: 7.4 10*3/uL (ref 4.0–10.5)
nRBC: 0 % (ref 0.0–0.2)

## 2023-08-31 LAB — CBC
HCT: 31.4 % — ABNORMAL LOW (ref 39.0–52.0)
Hemoglobin: 10.2 g/dL — ABNORMAL LOW (ref 13.0–17.0)
MCH: 29.7 pg (ref 26.0–34.0)
MCHC: 32.5 g/dL (ref 30.0–36.0)
MCV: 91.5 fL (ref 80.0–100.0)
Platelets: 195 10*3/uL (ref 150–400)
RBC: 3.43 MIL/uL — ABNORMAL LOW (ref 4.22–5.81)
RDW: 15.3 % (ref 11.5–15.5)
WBC: 8 10*3/uL (ref 4.0–10.5)
nRBC: 0 % (ref 0.0–0.2)

## 2023-08-31 LAB — COMPREHENSIVE METABOLIC PANEL WITH GFR
ALT: 27 U/L (ref 0–44)
AST: 26 U/L (ref 15–41)
Albumin: 3.7 g/dL (ref 3.5–5.0)
Alkaline Phosphatase: 73 U/L (ref 38–126)
Anion gap: 24 — ABNORMAL HIGH (ref 5–15)
BUN: 57 mg/dL — ABNORMAL HIGH (ref 6–20)
CO2: 14 mmol/L — ABNORMAL LOW (ref 22–32)
Calcium: 8.9 mg/dL (ref 8.9–10.3)
Chloride: 103 mmol/L (ref 98–111)
Creatinine, Ser: 10.81 mg/dL — ABNORMAL HIGH (ref 0.61–1.24)
GFR, Estimated: 6 mL/min — ABNORMAL LOW (ref >60)
Glucose, Bld: 104 mg/dL — ABNORMAL HIGH (ref 70–99)
Potassium: 4.3 mmol/L (ref 3.5–5.1)
Sodium: 141 mmol/L (ref 135–145)
Total Bilirubin: 0.6 mg/dL (ref 0.0–1.2)
Total Protein: 6.4 g/dL — ABNORMAL LOW (ref 6.5–8.1)

## 2023-08-31 LAB — CBG MONITORING, ED: Glucose-Capillary: 94 mg/dL (ref 70–99)

## 2023-08-31 LAB — CK: Total CK: 685 U/L — ABNORMAL HIGH (ref 49–397)

## 2023-08-31 LAB — CREATININE, SERUM
Creatinine, Ser: 11.35 mg/dL — ABNORMAL HIGH (ref 0.61–1.24)
GFR, Estimated: 5 mL/min — ABNORMAL LOW (ref >60)

## 2023-08-31 LAB — ETHANOL: Alcohol, Ethyl (B): <10 mg/dL (ref <10)

## 2023-08-31 LAB — HEPATITIS B SURFACE ANTIGEN: Hepatitis B Surface Ag: NONREACTIVE

## 2023-08-31 MED ORDER — CHLORHEXIDINE GLUCONATE CLOTH 2 % EX PADS
6.0000 | MEDICATED_PAD | Freq: Every day | CUTANEOUS | Status: DC
  Administered 2023-09-01 – 2023-09-02 (×2): 6 via TOPICAL

## 2023-08-31 MED ORDER — HEPARIN SODIUM (PORCINE) 5000 UNIT/ML IJ SOLN
5000.0000 [IU] | Freq: Three times a day (TID) | INTRAMUSCULAR | Status: DC
  Administered 2023-08-31 – 2023-09-02 (×5): 5000 [IU] via SUBCUTANEOUS
  Filled 2023-08-31 (×6): qty 1

## 2023-08-31 MED ORDER — ONDANSETRON HCL 4 MG/2ML IJ SOLN: 4.0000 mg | Freq: Four times a day (QID) | INTRAMUSCULAR | Status: DC | PRN

## 2023-08-31 MED ORDER — HYDROMORPHONE HCL 1 MG/ML IJ SOLN: 0.5000 mg | INTRAMUSCULAR | Status: DC | PRN

## 2023-08-31 MED ORDER — ACETAMINOPHEN 650 MG RE SUPP: 650.0000 mg | Freq: Four times a day (QID) | RECTAL | Status: DC | PRN

## 2023-08-31 MED ORDER — CALCITRIOL 0.25 MCG PO CAPS
1.2500 ug | ORAL_CAPSULE | ORAL | Status: DC
  Administered 2023-09-01: 1.25 ug via ORAL
  Filled 2023-08-31: qty 5

## 2023-08-31 MED ORDER — LORAZEPAM 2 MG/ML IJ SOLN
1.0000 mg | Freq: Once | INTRAMUSCULAR | Status: AC
  Administered 2023-08-31: 1 mg via INTRAVENOUS
  Filled 2023-08-31: qty 1

## 2023-08-31 MED ORDER — ONDANSETRON HCL 4 MG PO TABS: 4.0000 mg | ORAL_TABLET | Freq: Four times a day (QID) | ORAL | Status: DC | PRN

## 2023-08-31 MED ORDER — ACETAMINOPHEN 325 MG PO TABS
650.0000 mg | ORAL_TABLET | Freq: Four times a day (QID) | ORAL | Status: DC | PRN
  Administered 2023-09-02: 650 mg via ORAL
  Filled 2023-08-31: qty 2

## 2023-08-31 NOTE — Plan of Care (Signed)

## 2023-08-31 NOTE — Assessment & Plan Note (Signed)
 Positive deformity of left shoulder joint, it is tender to palpation and decreased range of movement.  Further work up with radiograph of the left shoulder joint.  Pain control for now with acetaminophen, avoid sedative narcotics.

## 2023-08-31 NOTE — ED Triage Notes (Signed)
 EMS found pt in middle of street Pt is confused, not able to answer question. Do not know baseline Dialysis pt: unknown last dialysis or schedule 180/90 104 99% room air Cbg 102

## 2023-08-31 NOTE — Assessment & Plan Note (Signed)
 Patient with no volume overloaded, his K is 4.3 and BUN is 57   Apparently his schedule for HD is Tuesday, Thursday and Saturday.  No volume overload, no hyperkalemic or acidosis.  Not clear if he is under the effects of any toxins.   May need HD today from encephalopathy perspective, will consult nephrology for further recommendations.

## 2023-08-31 NOTE — Consult Note (Signed)
 [name] Renal Consultation Note    Indication for Consultation:  Management of ESRD/hemodialysis; anemia, hypertension/volume and secondary hyperparathyroidism PCP: None on file Nephrologist: Dr. Marisue Humble  PLEASE NOTE THAT CHART HAS BEEN MARKED FOR MERGE. CORRECT MR [id number]  HPI: William Gregory is a 38 y.o. male on hemodialysis T,Th,S at [facility]. No HD since 07/31/2023 until 08/25/2023. Noncompliant with HD. He presented to ED after being found down in street. He is homeless. VWU:[id number], hypertension, polysubstance abuse.  Was admitted 08/02/2023 for seizure like activity/abnormal behavior at home. He became agitated and combative in HD unit. Was escorted from unit by security. Left AMA.  He has been admitted as observation patient of altered mental status.   Patient seen in room. Able to wake pt but he turned over on his stomach, lying face down, refuses to talk to me. HPI gathered from medical records.   Note: Last HD 08/25/2023 after missing almost one month of HD. Will need to lower HD time to avoid dialysis disequilibrium syndrome.    No family history on file. Social History:  has no history on file for tobacco use, alcohol use, and drug use. Not on File Prior to Admission medications   Not on File   Current Facility-Administered Medications  Medication Dose Route Frequency Provider Last Rate Last Admin   acetaminophen (TYLENOL) tablet 650 mg  650 mg Oral Q6H PRN Arrien, York Ram, MD       Or   acetaminophen (TYLENOL) suppository 650 mg  650 mg Rectal Q6H PRN Arrien, York Ram, MD       heparin injection 5,000 Units  5,000 Units Subcutaneous Q8H Arrien, York Ram, MD       ondansetron [facility]) tablet 4 mg  4 mg Oral Q6H PRN Arrien, York Ram, MD       Or   ondansetron [facility]) injection 4 mg  4 mg Intravenous Q6H PRN Arrien, York Ram, MD       Labs: Basic Metabolic Panel: Recent Labs  Lab 08/31/23 0520  NA 141   K 4.3  CL 103  CO2 14*  GLUCOSE 104*  BUN 57*  CREATININE 10.81*  CALCIUM 8.9   Liver Function Tests: Recent Labs  Lab 08/31/23 0520  AST 26  ALT 27  ALKPHOS 73  BILITOT 0.6  PROT 6.4*  ALBUMIN 3.7   No results for input(s): "LIPASE", "AMYLASE" in the last 168 hours. No results for input(s): "AMMONIA" in the last 168 hours. CBC: Recent Labs  Lab 08/31/23 0520  WBC 7.4  NEUTROABS 5.2  HGB 10.0*  HCT 31.9*  MCV 94.4  PLT 210   Cardiac Enzymes: Recent Labs  Lab 08/31/23 0520  CKTOTAL 685*   CBG: Recent Labs  Lab 08/31/23 0505  GLUCAP 94   Iron Studies: No results for input(s): "IRON", "TIBC", "TRANSFERRIN", "FERRITIN" in the last 72 hours. Studies/Results: CT Head Wo Contrast Result Date: 08/31/2023 CLINICAL DATA:  Mental status change with unknown cause, possible seizure. EXAM: CT HEAD WITHOUT CONTRAST TECHNIQUE: Contiguous axial images were obtained from the base of the skull through the vertex without intravenous contrast. RADIATION DOSE REDUCTION: This exam was performed according to the departmental dose-optimization program which includes automated exposure control, adjustment of the mA and/or kV according to patient size and/or use of iterative reconstruction technique. COMPARISON:  02/09/2018 FINDINGS: Brain: No evidence of acute infarction, hemorrhage, hydrocephalus, extra-axial collection or mass lesion/mass effect. Vascular: No hyperdense vessel or unexpected calcification. Skull: Normal. Negative for fracture or focal lesion.  Sinuses/Orbits: No acute finding. IMPRESSION: Negative head CT Electronically Signed   By: Tiburcio Pea M.D.   On: 08/31/2023 06:13   DG Chest Portable 1 View Result Date: 08/31/2023 CLINICAL DATA:  Altered mental status. EXAM: PORTABLE CHEST 1 VIEW COMPARISON:  03/30/2022 FINDINGS: There is a right chest wall dialysis catheter with tips in the distal SVC. No pneumothorax identified. No pleural fluid, interstitial edema or airspace  disease. The visualized osseous structures are unremarkable. IMPRESSION: 1. No acute findings. 2. Right chest wall dialysis catheter with tips in the distal SVC. Electronically Signed   By: Signa Kell M.D.   On: 08/31/2023 05:48    ROS: As per HPI otherwise negative.   Physical Exam: Vitals:   08/31/23 0945 08/31/23 1115 08/31/23 1122 08/31/23 1150  BP:  (!) 152/98  (!) 152/95  Pulse:  66  66  Resp:  10  16  Temp: 97.6 F (36.4 C)  97.7 F (36.5 C) 98 F (36.7 C)  TempSrc: Oral  Oral Oral  SpO2:  97%  92%  Weight:      Height:         General: Chronically ill appearing male in NAD Head: Normocephalic, atraumatic Neck: Supple. JVD not elevated. Lungs: CTAB posteriorly Heart: unable to assess-patient lying on stomach Abdomen: Unable to assess-patient lying on stomach Lower extremities:No LE edema Neuro: Unable to assess. Refusing to talk, does not follow commands Psych:  Refuses to talk  Dialysis Access: TDC L AVF +T  Dialysis Orders: [facility] T,Th,S 4 hrs 180NRe 400/600 83 kg 2.0 K/ 2.0 Ca TDC/AVF - Heparin 3400 units IV - Venofer 50 mg IV q week - Mircera 60 mcg IV q 2 weeks (last dose 08/25/2023) - Calcitriol 1.25 mcg PO three times per week   Assessment/Plan:  AMS-work up per primary. CT of head unremarkable. BUN 57 missed HD treatments. Also H/O polysubstance abuse and seizure disorder  ESRD -  T, Th, S-no indications for HD today   Hypertension/volume  - CXR unremarkable. Does not appear volume overloaded by exam. Close to OP EDW 08/28/2023.UF as tolerated.   Anemia  - HGB 10.0. Recent ESA. Follow HGB  Metabolic bone disease -  Add PO4 to labs. Continue velphoro binder, VDRA, sensipar  Nutrition - Currently NPO. Albumin 3.7.  Seizure disorder-no AED on med list.     Dene Gentry. Manson Passey, NP-C 08/31/2023, 1:32 PM  [employer] [phone]

## 2023-08-31 NOTE — H&P (Addendum)
 History and Physical    Patient: William Gregory ZOX:[id number] DOB: 1985-06-13 DOA: 08/31/2023 DOS: the patient was seen and examined on 08/31/2023 PCP: Pcp, No  Patient coming from: Home  Chief Complaint:  Chief Complaint  Patient presents with   Altered Mental Status   HPI: William Gregory is a 38 y.o. male with medical history significant of ESRD on HD, who presented with altered mental status. He was found down in the middle of the street, confused and disorientated. In the ED he was note able to answer any detailed questions, apparently his HD schedule is Tuesday, Thursday and Saturday. He knew he was in the hospital and was able to give his name to the ED staff.   At the time of my examination he is very somnolent and very difficult to arouse. When awake he kept his eyes closed and mentioned having left shoulder pain. He stood from the stretcher and was able to walk a few steps, then he got back to the stretcher. Not willing to answer any further questions.    Review of Systems: Unable to review all systems due to lack of cooperation from patient. No past medical history on file.  Social History:  has no history on file for tobacco use, alcohol use, and drug use.  Not on File  No family history on file.  Prior to Admission medications   Not on File    Physical Exam: Vitals:   08/31/23 0500 08/31/23 0504 08/31/23 0945  BP:  (!) 160/91   Pulse:  97   Resp:  20   Temp:  97.6 F (36.4 C) 97.6 F (36.4 C)  TempSrc:  Oral Oral  SpO2:  100%   Weight: 81.6 kg    Height: 5\' 8"  (1.727 m)     Neurology eyes closed, somnolent and very difficult to arouse. He was able to walk with no assistance. Not cooperative with formal neurologic examination. ENT with mild pallor Cardiovascular with S1 and S2 present and regular with no gallops, rubs or murmurs Respiratory with bilateral rhonchi with no rales or wheezing Abdomen with no distention  No lower extremity edema.  AV  fistula on his left upper extremity with palpable bruit, HD cathter tunneled to the internal jugular vein. Positive painful deformity on his left shoulder with apparent limited mobility due to pain.  Data Reviewed:   Na 141, K 4,3 Cl 103, bicarbonate 14 glucose 104 BUN 57 Cr 10,8 AST 26 ALT 27  Ck 685  Wbc 7.4 hgb 10.0 plt 210  Alcohol < 10 CT head negative for acute changes.   Chest radiography with no cardiomegaly, no infiltrates, no effusions.   EKG 99 bpm, normal axis, normal intervals, qtc 470, sinus rhythm with no significant ST segment or T wave changes.   Assessment and Plan: * Altered mental status Patient continue to be somnolent, not clear if he has diagnosis of seizures.  His Ck is mildly elevated.  He is hemodynamically stable.   Plan to admit to medical telemetry for further work up.  Check lactic acid.  Neuro checks q 4 hrs.  Follow up with neurology recommendations.   End stage renal disease on dialysis [facility]) Patient with no volume overloaded, his K is 4.3 and BUN is 57   Apparently his schedule for HD is Tuesday, Thursday and Saturday.  No volume overload, no hyperkalemic or acidosis.  Not clear if he is under the effects of any toxins.   May need HD today from  encephalopathy perspective, will consult nephrology for further recommendations.   Essential hypertension Not clear if patient taking antihypertensive medications.  Will continue blood pressure monitoring for now.   Left shoulder pain Positive deformity of left shoulder joint, it is tender to palpation and decreased range of movement.  Further work up with radiograph of the left shoulder joint.  Pain control for now with acetaminophen, avoid sedative narcotics.       Advance Care Planning:   Code Status: Full Code   Consults: Neurology   Family Communication: no family at the bedside   Severity of Illness: The appropriate patient status for this patient is OBSERVATION. Observation status  is judged to be reasonable and necessary in order to provide the required intensity of service to ensure the patient's safety. The patient's presenting symptoms, physical exam findings, and initial radiographic and laboratory data in the context of their medical condition is felt to place them at decreased risk for further clinical deterioration. Furthermore, it is anticipated that the patient will be medically stable for discharge from the hospital within 2 midnights of admission.   Author: Coralie Keens, MD 08/31/2023 11:02 AM  For on call review [email or web address].

## 2023-08-31 NOTE — ED Notes (Signed)
 Pocket knife seen on pt after ripping off equipment and walking to bathroom despite staff insist ance. Security called for removal of knife, knife in possession of security for pts departure.

## 2023-08-31 NOTE — Progress Notes (Addendum)
 Follow up left shoulder radiograph with mildly displaced and comminuted distal right clavicular fracture.   I spoke with Orthopedics, recommended sling.  Will add pain control with hydromorphone as needed.  Will need follow radiograph in one week.   I spoke with patient's mother at the bedside, we talked in detail about patient's condition, plan of care and prognosis and all questions were addressed.

## 2023-08-31 NOTE — ED Provider Notes (Signed)
 Patient signed out to me pending recommendations by neurology.  Discussed with neurohospitalist and patient will be admitted for EEG and MRI   Lorre Nick, MD 08/31/23 [phone]

## 2023-08-31 NOTE — Progress Notes (Addendum)
 Plan of care is reviewed.   Pt has been drowsy and sleeping since his arrival. He is more combative this evening. He does not want to be bothered with the routine nursing care.   He refused Heparin SQ tonight at bedtime and refused to wear arm sling. He woke up non-compliant with safety precaution and high fall risk, got out of bed, pulled monitor out, combative and irritable, oriented x 1 to self only. He frequently requested for [facility]. He is under fluid restriction <1200 ml per day. He already drank more than 1200 ml tonight. He became more irritable and yelled out loud if we don't give him soda drink.     Stable hemodynamically, NSR on the monitor, HR 80s, BP 196/84 mmHg from his left leg ( the only way he let us get BP at this time), RR 14, normal respiratory effort, SPO2 93-94% on room air. No distress.   Later Pt let us check his BP on right arm. BP 155/110 mmHg.    We will continue to monitor.   Filiberto Pinks, RN

## 2023-08-31 NOTE — Assessment & Plan Note (Addendum)
 Patient continue to be somnolent, not clear if he has diagnosis of seizures.  He had lorazepam 1 mg IV at 5:30 am.  His Ck is mildly elevated.  He is hemodynamically stable.   Plan to admit to medical telemetry for further work up.  Check lactic acid.  Neuro checks q 4 hrs.  Follow up with neurology recommendations.

## 2023-08-31 NOTE — Progress Notes (Signed)
 Orthopedic Tech Progress Note Patient Details:  William Gregory 02-12-1986 [id number]  Ortho Devices Type of Ortho Device: Sling immobilizer Ortho Device/Splint Location: LUE? pt. sleeping left at bedside Ortho Device/Splint Interventions: Ordered   Post Interventions Patient Tolerated: Other (comment)  [name] 08/31/2023, 6:14 PM

## 2023-08-31 NOTE — Care Management (Addendum)
 Transition of Care [facility]) - Inpatient Brief Assessment   Patient Details  Name: William Gregory MRN: [id number] Date of Birth: [birth year]  [facility] CM/SW Contact:    Lockie Pares, RN Phone Number: 08/31/2023, 12:24 PM   Clinical Narrative:  38 yo picked up  in the middle of the street confused. He apparently does dialysis T-TH-S.  Not known which center he uses. He has  kusted a SO and mother. SO called and left a message to return call. Mother Ms Kleckner called for collateral information. She was not aware that he was in the hospital and is coming right away. Notified  Nursing and provider.  Notified renal CSW of admission.  Transition of Care Asessment: Insurance and Status: Insurance coverage has been reviewed Patient has primary care physician: No Home environment has been reviewed: unsure, Prior level of function:: Independent Prior/Current Home Services: No current home services Social Drivers of Health Review:  (Unknown) Readmission risk has been reviewed: Yes Transition of care needs: transition of care needs identified, TOC will continue to follow

## 2023-08-31 NOTE — ED Notes (Signed)
 Report given to [facility] RN no questions at this time

## 2023-08-31 NOTE — ED Provider Notes (Signed)
 [place] EMERGENCY DEPARTMENT AT [facility] Provider Note   CSN: [id number] Arrival date & time: 08/31/23  0455     History  Chief Complaint  Patient presents with   Altered Mental Status    William Gregory is a 38 y.o. male.  Patient brought into the emergency department via EMS.  Initially unable to tell his name or answer any questions.  Level 5 caveat due to patient's mental status.  Patient with permacath in right chest, appears to be dialysis patient but unknown schedule. Patient slowly began to be able to give name, birthday, and acknowledged that he was in the hospital.  Patient unable to answer questions about last dialysis session, why he is in the hospital.  He has movements consistent with baseline of tardive dyskinesia, also concerning for possible seizure-like activity.  Chart review shows the patient is a Tuesday, Thursday, Saturday dialysis patient.   Altered Mental Status      Home Medications Prior to Admission medications   Not on File      Allergies    Patient has no allergy information on record.    Review of Systems   Review of Systems  Physical Exam Updated Vital Signs BP (!) 160/91 (BP Location: Right Arm)   Pulse 97   Temp 97.6 F (36.4 C) (Oral)   Resp 20   Ht 5\' 8"  (1.727 m)   Wt 81.6 kg   SpO2 100%   BMI 27.37 kg/m  Physical Exam Vitals reviewed.  Constitutional:      General: He is not in acute distress.    Appearance: He is well-developed. He is not diaphoretic.  HENT:     Head: Normocephalic and atraumatic.     Nose: Nose normal.  Eyes:     General: No scleral icterus.       Right eye: No discharge.        Left eye: No discharge.     Conjunctiva/sclera: Conjunctivae normal.     Pupils: Pupils are equal, round, and reactive to light.  Cardiovascular:     Rate and Rhythm: Normal rate and regular rhythm.     Heart sounds: No murmur heard.    Arteriovenous access: Left arteriovenous access is  present. Pulmonary:     Effort: Pulmonary effort is normal. No respiratory distress.     Breath sounds: Normal breath sounds. No stridor. No rales.  Chest:     Abdominal:     General: There is no distension.     Palpations: Abdomen is soft.     Tenderness: There is no abdominal tenderness.  Musculoskeletal:        General: No tenderness.     Cervical back: Normal range of motion and neck supple.  Skin:    General: Skin is warm and dry.     Findings: No erythema or rash.  Neurological:     Mental Status: He is alert.     Comments: States his name and acknowledges being in hospital Follows simple commands. Moves all extremities.  ED Results / Procedures / Treatments   Labs (all labs ordered are listed, but only abnormal results are displayed) Labs Reviewed  CBC WITH DIFFERENTIAL/PLATELET - Abnormal; Notable for the following components:      Result Value   RBC 3.38 (*)    Hemoglobin 10.0 (*)    HCT 31.9 (*)    All other components within normal limits  COMPREHENSIVE METABOLIC PANEL WITH GFR  CK  ETHANOL  RAPID  URINE DRUG SCREEN, HOSP PERFORMED  URINALYSIS, ROUTINE W REFLEX MICROSCOPIC  CBG MONITORING, ED    EKG None  Radiology No results found.  Procedures Procedures    Medications Ordered in ED Medications  LORazepam (ATIVAN) injection 1 mg (has no administration in time range)    ED Course/ Medical Decision Making/ A&P                                 Medical Decision Making Amount and/or Complexity of Data Reviewed Labs: ordered. Radiology: ordered.  Risk Prescription drug management.   This patient presents to the ED for concern of altered mental status, this involves an extensive number of treatment options, and is a complaint that carries with it a high risk of complications and morbidity.  The differential diagnosis includes seizure, substance use, infection, electrolyte imbalance, intracranial abnormality, others   Co morbidities that  complicate the patient evaluation  End-stage renal disease on hemodialysis   Additional history obtained:  Additional history obtained from EMS External records from outside source obtained and reviewed including previous discharge summary   Lab Tests:  I Ordered, and personally interpreted labs.  The pertinent results include: CK6 85   Imaging Studies ordered:  I ordered imaging studies including chest x-ray and CT head without contrast I independently visualized and interpreted imaging which showed  1. No acute findings.  2. Right chest wall dialysis catheter with tips in the distal SVC   I agree with the radiologist interpretation   Consultations Obtained:  I requested consultation with the neurologist, Dr. Derry Lory,  and discussed lab and imaging findings   Problem List / ED Course / Critical interventions / Medication management   I ordered medication including ativan for seizure like activity  Reevaluation of the patient after these medicines showed that the patient improved I have reviewed the patients home medicines and have made adjustments as needed   Social Determinants of Health:  Patient smokes tobacco   Test / Admission - Considered:  Patient care signed out to [facility] at shift handoff. Plan likely hospital admission for altered mental status. At last admission it appears that an MRI and EEG were recommended but patient left AMA prior to completion.          Final Clinical Impression(s) / ED Diagnoses Final diagnoses:  None    Rx / DC Orders ED Discharge Orders     None         Pamala Duffel 08/31/23 1191    Nira Conn, MD 08/31/23 [phone]

## 2023-08-31 NOTE — Assessment & Plan Note (Signed)
 Not clear if patient taking antihypertensive medications.  Will continue blood pressure monitoring for now.

## 2023-09-01 ENCOUNTER — Inpatient Hospital Stay (HOSPITAL_COMMUNITY): Payer: MEDICAID

## 2023-09-01 ENCOUNTER — Observation Stay (HOSPITAL_COMMUNITY): Payer: MEDICAID

## 2023-09-01 ENCOUNTER — Ambulatory Visit (HOSPITAL_COMMUNITY): Payer: MEDICAID

## 2023-09-01 DIAGNOSIS — S42031A Displaced fracture of lateral end of right clavicle, initial encounter for closed fracture: Secondary | ICD-10-CM | POA: Diagnosis present

## 2023-09-01 DIAGNOSIS — R410 Disorientation, unspecified: Secondary | ICD-10-CM

## 2023-09-01 DIAGNOSIS — Z992 Dependence on renal dialysis: Secondary | ICD-10-CM | POA: Diagnosis not present

## 2023-09-01 DIAGNOSIS — Z91158 Patient's noncompliance with renal dialysis for other reason: Secondary | ICD-10-CM | POA: Diagnosis not present

## 2023-09-01 DIAGNOSIS — Z5900 Homelessness unspecified: Secondary | ICD-10-CM | POA: Diagnosis not present

## 2023-09-01 DIAGNOSIS — G40509 Epileptic seizures related to external causes, not intractable, without status epilepticus: Secondary | ICD-10-CM | POA: Diagnosis present

## 2023-09-01 DIAGNOSIS — R569 Unspecified convulsions: Secondary | ICD-10-CM

## 2023-09-01 DIAGNOSIS — F17200 Nicotine dependence, unspecified, uncomplicated: Secondary | ICD-10-CM | POA: Diagnosis present

## 2023-09-01 DIAGNOSIS — T405X1A Poisoning by cocaine, accidental (unintentional), initial encounter: Secondary | ICD-10-CM | POA: Diagnosis present

## 2023-09-01 DIAGNOSIS — F259 Schizoaffective disorder, unspecified: Secondary | ICD-10-CM | POA: Diagnosis present

## 2023-09-01 DIAGNOSIS — Z91148 Patient's other noncompliance with medication regimen for other reason: Secondary | ICD-10-CM | POA: Diagnosis not present

## 2023-09-01 DIAGNOSIS — N250 Renal osteodystrophy: Secondary | ICD-10-CM | POA: Diagnosis present

## 2023-09-01 DIAGNOSIS — I120 Hypertensive chronic kidney disease with stage 5 chronic kidney disease or end stage renal disease: Secondary | ICD-10-CM | POA: Diagnosis present

## 2023-09-01 DIAGNOSIS — G2401 Drug induced subacute dyskinesia: Secondary | ICD-10-CM | POA: Diagnosis present

## 2023-09-01 DIAGNOSIS — D631 Anemia in chronic kidney disease: Secondary | ICD-10-CM | POA: Diagnosis present

## 2023-09-01 DIAGNOSIS — N186 End stage renal disease: Secondary | ICD-10-CM | POA: Diagnosis present

## 2023-09-01 DIAGNOSIS — R251 Tremor, unspecified: Secondary | ICD-10-CM

## 2023-09-01 DIAGNOSIS — N2581 Secondary hyperparathyroidism of renal origin: Secondary | ICD-10-CM | POA: Diagnosis present

## 2023-09-01 DIAGNOSIS — F1410 Cocaine abuse, uncomplicated: Secondary | ICD-10-CM | POA: Diagnosis present

## 2023-09-01 DIAGNOSIS — R4182 Altered mental status, unspecified: Secondary | ICD-10-CM | POA: Diagnosis present

## 2023-09-01 DIAGNOSIS — Z91199 Patient's noncompliance with other medical treatment and regimen due to unspecified reason: Secondary | ICD-10-CM | POA: Diagnosis not present

## 2023-09-01 LAB — HEPATITIS B SURFACE ANTIBODY, QUANTITATIVE: Hep B S AB Quant (Post): 217 m[IU]/mL

## 2023-09-01 LAB — CBC
HCT: 30.5 % — ABNORMAL LOW (ref 39.0–52.0)
Hemoglobin: 9.8 g/dL — ABNORMAL LOW (ref 13.0–17.0)
MCH: 29.8 pg (ref 26.0–34.0)
MCHC: 32.1 g/dL (ref 30.0–36.0)
MCV: 92.7 fL (ref 80.0–100.0)
Platelets: 196 10*3/uL (ref 150–400)
RBC: 3.29 MIL/uL — ABNORMAL LOW (ref 4.22–5.81)
RDW: 15.3 % (ref 11.5–15.5)
WBC: 8 10*3/uL (ref 4.0–10.5)
nRBC: 0 % (ref 0.0–0.2)

## 2023-09-01 LAB — BASIC METABOLIC PANEL WITH GFR
Anion gap: 14 (ref 5–15)
BUN: 61 mg/dL — ABNORMAL HIGH (ref 6–20)
CO2: 20 mmol/L — ABNORMAL LOW (ref 22–32)
Calcium: 8.7 mg/dL — ABNORMAL LOW (ref 8.9–10.3)
Chloride: 107 mmol/L (ref 98–111)
Creatinine, Ser: 11.43 mg/dL — ABNORMAL HIGH (ref 0.61–1.24)
GFR, Estimated: 5 mL/min — ABNORMAL LOW (ref >60)
Glucose, Bld: 93 mg/dL (ref 70–99)
Potassium: 4.2 mmol/L (ref 3.5–5.1)
Sodium: 141 mmol/L (ref 135–145)

## 2023-09-01 MED ORDER — HYDRALAZINE HCL 20 MG/ML IJ SOLN
10.0000 mg | Freq: Four times a day (QID) | INTRAMUSCULAR | Status: DC | PRN
  Administered 2023-09-01: 10 mg via INTRAVENOUS
  Filled 2023-09-01: qty 1

## 2023-09-01 MED ORDER — VALBENAZINE TOSYLATE 40 MG PO CAPS
80.0000 mg | ORAL_CAPSULE | Freq: Every day | ORAL | Status: DC
  Administered 2023-09-01: 80 mg via ORAL
  Filled 2023-09-01 (×2): qty 2

## 2023-09-01 MED ORDER — HYDRALAZINE HCL 20 MG/ML IJ SOLN: 20.0000 mg | Freq: Four times a day (QID) | INTRAMUSCULAR | Status: DC | PRN

## 2023-09-01 MED ORDER — HYDRALAZINE HCL 20 MG/ML IJ SOLN
10.0000 mg | Freq: Once | INTRAMUSCULAR | Status: AC
  Administered 2023-09-01: 10 mg via INTRAVENOUS

## 2023-09-01 MED ORDER — AMLODIPINE BESYLATE 10 MG PO TABS
10.0000 mg | ORAL_TABLET | Freq: Every day | ORAL | Status: DC
  Administered 2023-09-01 – 2023-09-02 (×2): 10 mg via ORAL
  Filled 2023-09-01 (×2): qty 1

## 2023-09-01 MED ORDER — HYDROCODONE-ACETAMINOPHEN 5-325 MG PO TABS
1.0000 | ORAL_TABLET | Freq: Four times a day (QID) | ORAL | Status: DC | PRN
  Administered 2023-09-01 – 2023-09-02 (×3): 1 via ORAL
  Filled 2023-09-01 (×3): qty 1

## 2023-09-01 MED ORDER — CITALOPRAM HYDROBROMIDE 20 MG PO TABS
20.0000 mg | ORAL_TABLET | Freq: Every morning | ORAL | Status: DC
  Administered 2023-09-01 – 2023-09-02 (×2): 20 mg via ORAL
  Filled 2023-09-01 (×2): qty 1

## 2023-09-01 MED ORDER — BENZTROPINE MESYLATE 1 MG PO TABS
1.0000 mg | ORAL_TABLET | Freq: Two times a day (BID) | ORAL | Status: DC
  Administered 2023-09-01 – 2023-09-02 (×2): 1 mg via ORAL
  Filled 2023-09-01 (×4): qty 1

## 2023-09-01 MED ORDER — CINACALCET HCL 30 MG PO TABS
30.0000 mg | ORAL_TABLET | Freq: Every evening | ORAL | Status: DC
  Administered 2023-09-01: 30 mg via ORAL
  Filled 2023-09-01: qty 1

## 2023-09-01 MED ORDER — HEPARIN SODIUM (PORCINE) 1000 UNIT/ML IJ SOLN
INTRAMUSCULAR | Status: AC
  Filled 2023-09-01: qty 4

## 2023-09-01 MED ORDER — HEPARIN SODIUM (PORCINE) 1000 UNIT/ML IJ SOLN
INTRAMUSCULAR | Status: AC
Start: 2023-09-01 — End: 2023-09-01
  Filled 2023-09-01: qty 4

## 2023-09-01 NOTE — Progress Notes (Addendum)
 PROGRESS NOTE                                                                                                                                                                                                             Patient Demographics:    William Gregory, is a 38 y.o. male, DOB - 1985-09-26, WUJ:[id number]  Outpatient Primary MD for the patient is Pcp, No    LOS - 0  Admit date - 08/31/2023    Chief Complaint  Patient presents with   Altered Mental Status       Brief Narrative (HPI from H&P)   38 y.o. male with medical history significant of ESRD on HD, who presented with altered mental status. He was found down in the middle of the street, confused and disorientated. In the ED he was note able to answer any detailed questions, apparently his HD schedule is Tuesday, Thursday and Saturday. He knew he was in the hospital and was able to give his name to the ED staff.  He was admitted for possible seizure-like activity, was also found to have left clavicular fracture, nephrology, neurology and orthopedics were consulted.   Subjective:    William Gregory today in bed sleeping, refuses to answer questions   Assessment  & Plan :   Altered mental status, history of substance abuse, ongoing cocaine abuse.  Suspicious for possible seizure-like activity. By neurology, CT head nonacute, seizure-like activity thought to be due to history of ongoing cocaine abuse per neurology, MRI and EEG have been ordered patient reluctant to undergo test has been counseled, continue to monitor, mental status much improved morning of 09/01/2023  [facility] stage [facility] Patient with no volume overloaded, his K is 4.3 and BUN is 57, nephrology consulted on TTS schedule, HD on 09/01/2023.  Essential hypertension Start Norvasc along with as needed hydralazine, noncompliant with home medications  Left shoulder pain Positive  deformity of left shoulder joint, it is tender to palpation and decreased range of movement.  Imaging suggests left clavicular fracture, orthopedics consulted over the phone request placing the left arm and shoulder in a sling, patient refusing to wear sling despite counseling.  Monitor.  Schizoaffective disorder with Tardive dyskinesia.  Resume Ingrezza, Cogentin and Celexa, takes Paliperidone Palmitate ER shots every 3 months next due mid April.  3 of noncompliance with medications, noncompliance with therapeutic recommendations this admission, not wearing orthopedic sling in the left arm, signing out AMA in the past.  Counseled on compliance.      Condition - Fair  Family Communication  :  None present  Code Status :   Full  Consults  :  Neuro, Renal  PUD Prophylaxis :    Procedures  :     EEG.    MRI.    CT head.  Nonacute      Disposition Plan  :    Status is: Observation  DVT Prophylaxis  :    heparin injection 5,000 Units Start: 08/31/23 1400 SCDs Start: 08/31/23 1217    Lab Results  Component Value Date   PLT 195 08/31/2023    Diet :  Diet Order             Diet Heart Fluid consistency: Thin; Fluid restriction: 1200 mL Fluid  Diet effective now                    Inpatient Medications  Scheduled Meds:  amLODipine  10 mg Oral Daily   calcitRIOL  1.25 mcg Oral Q T,Th,Sa-HD   Chlorhexidine Gluconate Cloth  6 each Topical Q0600   heparin  5,000 Units Subcutaneous Q8H   Continuous Infusions: PRN Meds:.acetaminophen **OR** acetaminophen, hydrALAZINE, HYDROmorphone (DILAUDID) injection, ondansetron **OR** ondansetron (ZOFRAN) IV  Antibiotics  :    Anti-infectives (From admission, onward)    None         Objective:   Vitals:   09/01/23 0220 09/01/23 0400 09/01/23 0802 09/01/23 0806  BP: (S) (!) 155/110 (!) 188/82 (!) 217/84 (!) 185/96  Pulse: 94 90 (!) 102 98  Resp: 16 12 (!) 26 20  Temp: 98.9 F (37.2 C) 98.4 F (36.9 C) 98.7 F  (37.1 C)   TempSrc: Oral Axillary    SpO2: 99% 97% 100% 100%  Weight:   81.6 kg   Height:        Wt Readings from Last 3 Encounters:  09/01/23 81.6 kg     Intake/Output Summary (Last 24 hours) at 09/01/2023 0814 Last data filed at 09/01/2023 0244 Gross per 24 hour  Intake 750 ml  Output --  Net 750 ml     Physical Exam  sleeping in bed, William not answer questions, does not want to be woken up, not wearing sling on his left shoulder despite counseling [place].AT,PERRAL Supple Neck, No JVD,   Symmetrical Chest wall movement, Good air movement bilaterally, CTAB RRR,No Gallops,Rubs or new Murmurs,  +ve B.Sounds, Abd Soft, No tenderness,   No Cyanosis, Clubbing or edema       Data Review:    Recent Labs  Lab 08/31/23 0520 08/31/23 1625  WBC 7.4 8.0  HGB 10.0* 10.2*  HCT 31.9* 31.4*  PLT 210 195  MCV 94.4 91.5  MCH 29.6 29.7  MCHC 31.3 32.5  RDW 15.3 15.3  LYMPHSABS 1.4  --   MONOABS 0.6  --   EOSABS 0.1  --   BASOSABS 0.1  --     Recent Labs  Lab 08/31/23 0520 08/31/23 1625  NA 141  --   K 4.3  --   CL 103  --   CO2 14*  --   ANIONGAP 24*  --   GLUCOSE 104*  --   BUN 57*  --   CREATININE 10.81* 11.35*  AST 26  --   ALT 27  --  ALKPHOS 73  --   BILITOT 0.6  --   ALBUMIN 3.7  --   CALCIUM 8.9  --       Recent Labs  Lab 08/31/23 0520  CALCIUM 8.9      Micro Results No results found for this or any previous visit (from the past 240 hours).  Radiology Report DG Shoulder 1V Left Result Date: 08/31/2023 CLINICAL DATA:  Left shoulder pain. EXAM: LEFT SHOULDER COMPARISON:  None Available. FINDINGS: Mildly displaced comminuted fracture is seen involving the distal right clavicle. No evidence of dislocation is seen involving right glenohumeral joint. IMPRESSION: Mildly displaced and comminuted distal right clavicular fracture. Electronically Signed   By: Lupita Raider M.D.   On: 08/31/2023 13:55   CT Head Wo Contrast Result Date:  08/31/2023 CLINICAL DATA:  Mental status change with unknown cause, possible seizure. EXAM: CT HEAD WITHOUT CONTRAST TECHNIQUE: Contiguous axial images were obtained from the base of the skull through the vertex without intravenous contrast. RADIATION DOSE REDUCTION: This exam was performed according to the departmental dose-optimization program which includes automated exposure control, adjustment of the mA and/or kV according to patient size and/or use of iterative reconstruction technique. COMPARISON:  02/09/2018 FINDINGS: Brain: No evidence of acute infarction, hemorrhage, hydrocephalus, extra-axial collection or mass lesion/mass effect. Vascular: No hyperdense vessel or unexpected calcification. Skull: Normal. Negative for fracture or focal lesion. Sinuses/Orbits: No acute finding. IMPRESSION: Negative head CT Electronically Signed   By: Tiburcio Pea M.D.   On: 08/31/2023 06:13   DG Chest Portable 1 View Result Date: 08/31/2023 CLINICAL DATA:  Altered mental status. EXAM: PORTABLE CHEST 1 VIEW COMPARISON:  03/30/2022 FINDINGS: There is a right chest wall dialysis catheter with tips in the distal SVC. No pneumothorax identified. No pleural fluid, interstitial edema or airspace disease. The visualized osseous structures are unremarkable. IMPRESSION: 1. No acute findings. 2. Right chest wall dialysis catheter with tips in the distal SVC. Electronically Signed   By: Signa Kell M.D.   On: 08/31/2023 05:48     Signature  -   Susa Raring M.D on 09/01/2023 at 8:14 AM   -  To page go to [email or web address]

## 2023-09-01 NOTE — Consult Note (Signed)
 NEUROLOGY CONSULT NOTE   Date of service: September 01, 2023 Patient Name: William Gregory MRN:  [id number] DOB:  12-Mar-1986 Chief Complaint: "concern for seizures" Requesting Provider: Leroy Sea, MD  History of Present Illness  Kindred E Coor is a 38 y.o. 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  There was concern that he could have had a seizure.  He was evaluated by her team in February this year and he is well-known to our service.  Back in late February of this year, who presented after he had a seizure at his girlfriend's house.  He was admitted and was evaluated by our team and recommended an MRI and the routine EEG.  His drug screen was positive for cocaine.  He declined any testing and left AMA.  He he is aware that he is at the hospital.  He wants to eat food.  He is upset that he is on fluid restriction and cannot have more soda.  He has agreed to blood draw to test for cocaine and to an MRI and routine EEG this time.  He is not sure if he had a seizure.  He declined to answer my question about whether he is still using cocaine.  I talked to his mom who is very aware of the situation.  She tells me that patient hides his substance use from her and the rest of the family.    ROS  Unable to ascertain due to due to agitation from a line of questioning.  Past History  No past medical history on file.  Family History: No family history on file.  Social History  has no history on file for tobacco use, alcohol use, and drug use.  Not on File  Medications   Current Facility-Administered Medications:    acetaminophen (TYLENOL) tablet 650 mg, 650 mg, Oral, Q6H PRN **OR**  acetaminophen (TYLENOL) suppository 650 mg, 650 mg, Rectal, Q6H PRN, Arrien, York Ram, MD   amLODipine (NORVASC) tablet 10 mg, 10 mg, Oral, Daily, Leroy Sea, MD, 10 mg at 09/01/23 0865   calcitRIOL (ROCALTROL) capsule 1.25 mcg, 1.25 mcg, Oral, Q T,Th,Sa-HD, Pola Corn, NP   Chlorhexidine Gluconate Cloth 2 % PADS 6 each, 6 each, Topical, Q0600, Pola Corn, NP, 6 each at 09/01/23 0238   heparin injection 5,000 Units, 5,000 Units, Subcutaneous, Q8H, Arrien, York Ram, MD, 5,000 Units at 09/01/23 0239   hydrALAZINE (APRESOLINE) injection 10 mg, 10 mg, Intravenous, Q6H PRN, Leroy Sea, MD   HYDROmorphone (DILAUDID) injection 0.5 mg, 0.5 mg, Intravenous, Q2H PRN, Arrien, York Ram, MD   ondansetron [facility]) tablet 4 mg, 4 mg, Oral, Q6H PRN **OR** ondansetron (ZOFRAN) injection 4 mg, 4 mg, Intravenous, Q6H PRN, Coralie Keens, MD  Vitals   Vitals:   09/01/23 0000 09/01/23 0200 09/01/23 0220 09/01/23 0400  BP: (!) 177/77  (S) (!) 155/110 (!) 188/82  Pulse: 73 79 94 90  Resp: 16 11 16 12   Temp: 98 F (36.7 C)  98.9 F (37.2 C) 98.4 F (36.9 C)  TempSrc: Axillary  Oral Axillary  SpO2: 94% 93% 99% 97%  Weight:      Height:        Body mass index is 27.37 kg/m.  Physical Exam   General: Laying comfortably in bed; in no acute distress.  HENT: Normal oropharynx and mucosa. Normal external appearance of ears and nose.  Neck: Supple, no pain or tenderness  CV: No JVD. No peripheral edema.  Pulmonary: Symmetric Chest rise. Normal respiratory effort.  Abdomen: Soft to touch, non-tender.  Ext: No cyanosis, edema, or deformity  Skin: No rash. Normal palpation of skin.   Musculoskeletal: Normal digits and nails by inspection. No clubbing.   Neurologic Examination limited by patient's participation.  Mental status/Cognition: Alert, oriented to self, place. Desclined to answer further orientation questions. Speech/language: Fluent,  comprehension intact, object naming intact, repetition intact. Cranial nerves:   CN II Pupils equal and reactive to light, makes eye contact.   CN III,IV,VI EOM intact, no gaze preference or deviation, no nystagmus    CN V normal sensation in V1, V2, and V3 segments bilaterally    CN VII no asymmetry, no nasolabial fold flattening    CN VIII normal hearing to speech    CN IX & X normal palatal elevation, no uvular deviation    CN XI 5/5 head turn and 5/5 shoulder shrug bilaterally    CN XII midline tongue protrusion    Motor:  Muscle bulk: normal, tone normal, pronator drift none tremor noted tardive dyskinesia  He moves all extremities spontaneously antigravity.  He is sitting up in the bed and eating his breakfast.  Mvmt Root Nerve  Muscle Right Left Comments  SA C5/6 Ax Deltoid     EF C5/6 Mc Biceps     EE C6/7/8 Rad Triceps     WF C6/7 Med FCR     WE C7/8 PIN ECU     F Ab C8/T1 U ADM/FDI 5 5   HF L1/2/3 Fem Illopsoas     KE L2/3/4 Fem Quad     DF L4/5 D Peron Tib Ant     PF S1/2 Tibial Grc/Sol      Sensation:  Light touch Intact throughout   Pin prick    Temperature    Vibration   Proprioception    Coordination/Complex Motor:  - Finger to Nose intact BL - Heel to shin intact BL - Rapid alternating movement are slowed. - Gait: deferred.  Labs/Imaging/Neurodiagnostic studies   CBC:  Recent Labs  Lab 2023/09/09 0520 09-09-23 1625  WBC 7.4 8.0  NEUTROABS 5.2  --   HGB 10.0* 10.2*  HCT 31.9* 31.4*  MCV 94.4 91.5  PLT 210 195   Basic Metabolic Panel:  Lab Results  Component Value Date   NA 141 09/09/2023   K 4.3 2023-09-09   CO2 14 (L) September 09, 2023   GLUCOSE 104 (H) Sep 09, 2023   BUN 57 (H) September 09, 2023   CREATININE 11.35 (H) 2023/09/09   CALCIUM 8.9 09-09-23   GFRNONAA 5 (L) 09-09-23   Lipid Panel: No results found for: "LDLCALC" HgbA1c: No results found for: "HGBA1C" Urine Drug Screen: No results found for: "LABOPIA", "COCAINSCRNUR", "LABBENZ",  "AMPHETMU", "THCU", "LABBARB"  Alcohol Level     Component Value Date/Time   ETH <10 09-Sep-2023 0520   INR No results found for: "INR" APTT No results found for: "APTT" AED levels: No results found for: "PHENYTOIN", "ZONISAMIDE", "LAMOTRIGINE", "LEVETIRACETA"  CT Head without contrast(Personally reviewed): CTH was negative for a large hypodensity concerning for a large territory infarct or hyperdensity concerning for an ICH.  ASSESSMENT   Jiovanni E Plata is a 38 y.o. 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  There was concern that he could have had a seizure with post ictal confusion.  It may very well be that he could have had a potential seizure at presentation contributing to his confusion at presentaiton.   He does have a history of cocaine use and per my discussion with his mother, high suspicion that he continues to use cocaine and I suspect that seizures are probably secondary to cocaine use.   He declined MRI and rEEG last month during hospitalization as part of seizure workup and instead left AMA.  I am not sure if he is going to let us get MRI Brain and rEEG this time. However, he has agreed to it, so we will order.  The hand tremulousness that he has is more consistent with tardive dyskinesias rather than seizures. He is noncompliant with ingrezza.  I would request ED and hospitalist team that he is known to our service and his cocaine use puts him at risk of both strokes and seizures. I would recommend that if in the future, he comes in with concern for seizure, to get an MRI brain and routine EEG for workup and to consult [place] only if these are positive or if there is concern for  persistent seizures or status epilepticus.  RECOMMENDATIONS  - MRI Brain w/o contrast - rEEG - serum drug screen ordered. ______________________________________________________________________  Welton Flakes, MD [facility]

## 2023-09-01 NOTE — Progress Notes (Cosign Needed Addendum)
 EEG complete - results pending

## 2023-09-01 NOTE — Procedures (Signed)
 I was present at this dialysis session. I have reviewed the session itself and made appropriate changes.   Vital signs in last 24 hours:  Temp:  [97.7 F (36.5 C)-98.9 F (37.2 C)] 98.7 F (37.1 C) (03/29 0802) Pulse Rate:  [66-102] 101 (03/29 0930) Resp:  [10-26] 14 (03/29 0930) BP: (152-217)/(77-110) 205/89 (03/29 0930) SpO2:  [92 %-100 %] 98 % (03/29 0930) Weight:  [81.6 kg] 81.6 kg (03/29 0802) Weight change:  Filed Weights   08/31/23 0500 09/01/23 0802  Weight: 81.6 kg 81.6 kg    Recent Labs  Lab 09/01/23 0813  NA 141  K 4.2  CL 107  CO2 20*  GLUCOSE 93  BUN 61*  CREATININE 11.43*  CALCIUM 8.7*    Recent Labs  Lab 08/31/23 0520 08/31/23 1625 09/01/23 0813  WBC 7.4 8.0 8.0  NEUTROABS 5.2  --   --   HGB 10.0* 10.2* 9.8*  HCT 31.9* 31.4* 30.5*  MCV 94.4 91.5 92.7  PLT 210 195 196    Scheduled Meds:  amLODipine  10 mg Oral Daily   benztropine  1 mg Oral BID   calcitRIOL  1.25 mcg Oral Q T,Th,Sa-HD   Chlorhexidine Gluconate Cloth  6 each Topical Q0600   cinacalcet  30 mg Oral QPM   citalopram  20 mg Oral q morning   heparin  5,000 Units Subcutaneous Q8H   valbenazine  80 mg Oral QHS   Continuous Infusions: PRN Meds:.acetaminophen **OR** acetaminophen, hydrALAZINE, HYDROcodone-acetaminophen, HYDROmorphone (DILAUDID) injection, ondansetron **OR** ondansetron (ZOFRAN) IV   Irena Cords,  MD 09/01/2023, 9:53 AM

## 2023-09-01 NOTE — Progress Notes (Signed)
 Pt just Transported to HD and Not available for EEG. Will follow up as schedule permits.

## 2023-09-01 NOTE — Progress Notes (Signed)
 Call received from Hemodialysis Nurse Evalyn, RN that patient is now agreeable to wear left arm sling. NT Bridget to walk the splint over to Hemodialysis unit for patient to wear.

## 2023-09-01 NOTE — Progress Notes (Addendum)
 Received patient in bed to unit.  Alert and oriented x2 Informed consent signed and in chart.   TX duration: 3 hours   Patient tolerated tx with multiple complaints  Transported back to the room per bed Alert, without acute distress.  Hand-off given to patient's nurse. Ardeen Fillers RN  Access used: Right CVC Access issues: None  Total UF removed: 900 Medication(s) given: oxycodone Post HD weight: 82.2 KG Post HD VS: 185/91, 100 % on room air , 85 Pulse , 14 Resp    Berdena Cisek S [facility]

## 2023-09-01 NOTE — Progress Notes (Signed)
 Transported off unit to Hemodialysis at this time via bed with Patient Transporter Staff member.  Refuses to wear left arm sling.

## 2023-09-01 NOTE — Progress Notes (Signed)
 Arrived back to 5W from Hemodialysis.  Lunch tray ordered.     09/01/23 1318  Vitals  Temp 98.7 F (37.1 C)  Temp Source Oral  BP (!) 189/89  MAP (mmHg) 112  BP Location Right Leg  BP Method Automatic  Patient Position (if appropriate) Lying  Pulse Rate 96  Pulse Rate Source Monitor  ECG Heart Rate 98  Resp 15  Level of Consciousness  Level of Consciousness Alert  MEWS COLOR  MEWS Score Color Green  Oxygen Therapy  SpO2 98 %  O2 Device Room Air  Pain Assessment  Pain Scale 0-10  Pain Score 0  MEWS Score  MEWS Temp 0  MEWS Systolic 0  MEWS Pulse 0  MEWS RR 0  MEWS LOC 0  MEWS Score 0

## 2023-09-01 NOTE — Plan of Care (Signed)
 Alert and oriented.  Able to ambulate in room.  Medicated for pain.   Problem: Education: Goal: Knowledge of General Education information will improve Description: Including pain rating scale, medication(s)/side effects and non-pharmacologic comfort measures Outcome: Progressing   Problem: Health Behavior/Discharge Planning: Goal: Ability to manage health-related needs will improve Outcome: Progressing   Problem: Clinical Measurements: Goal: Ability to maintain clinical measurements within normal limits will improve Outcome: Progressing Goal: Will remain free from infection Outcome: Progressing Goal: Diagnostic test results will improve Outcome: Progressing

## 2023-09-01 NOTE — Progress Notes (Signed)
 IV team was consulted for HD cath status evaluation. HD cath double lumens appear to have blood clot inside the two lumens and no labeling on last gauze dressing changed.  Filiberto Pinks, RN

## 2023-09-02 DIAGNOSIS — R4182 Altered mental status, unspecified: Secondary | ICD-10-CM

## 2023-09-02 DIAGNOSIS — R569 Unspecified convulsions: Secondary | ICD-10-CM

## 2023-09-02 MED ORDER — HYDRALAZINE HCL 50 MG PO TABS
50.0000 mg | ORAL_TABLET | Freq: Three times a day (TID) | ORAL | Status: DC
  Administered 2023-09-02: 50 mg via ORAL
  Filled 2023-09-02: qty 1

## 2023-09-02 MED ORDER — CARVEDILOL 6.25 MG PO TABS: 6.2500 mg | ORAL_TABLET | Freq: Two times a day (BID) | ORAL | Status: DC

## 2023-09-02 MED ORDER — IBUPROFEN 600 MG PO TABS: 600.0000 mg | ORAL_TABLET | Freq: Three times a day (TID) | ORAL | 0 refills | Status: DC | PRN

## 2023-09-02 MED ORDER — ACETAMINOPHEN 500 MG PO TABS: 500.0000 mg | ORAL_TABLET | Freq: Three times a day (TID) | ORAL | 0 refills | Status: DC | PRN

## 2023-09-02 MED ORDER — HYDROCODONE-ACETAMINOPHEN 5-325 MG PO TABS
1.0000 | ORAL_TABLET | Freq: Two times a day (BID) | ORAL | 0 refills | Status: AC | PRN
Start: 2023-09-02 — End: ?

## 2023-09-02 NOTE — Discharge Summary (Signed)
 William Gregory ZOX:[id number] DOB: [birth year] DOA: 08/31/2023  PCP: Pcp, No  Admit date: 08/31/2023  Discharge date: 09/02/2023  Admitted From: Home   Disposition:  Home   Recommendations for Outpatient Follow-up:   Follow up with PCP in 1-2 weeks  PCP Please obtain BMP/CBC, 2 view CXR in 1week,  (see Discharge instructions)   PCP Please follow up on the following pending results:    Home Health: None   Equipment/Devices: Sling  Consultations: Neurology, orthopedics over the phone Discharge Condition: Stable    CODE STATUS: Full    Diet Recommendation: Renal Diet with 1.2 L fluid restriction per day    Chief Complaint  Patient presents with   Altered Mental Status     Brief history of present illness from the day of admission and additional interim summary    38 y.o. male with medical history significant of ESRD on HD, who presented with altered mental status. He was found down in the middle of the street, confused and disorientated. In the ED he was note able to answer any detailed questions, apparently his HD schedule is Tuesday, Thursday and Saturday. He knew he was in the hospital and was able to give his name to the ED staff.  He was admitted for possible seizure-like activity, was also found to have left clavicular fracture, nephrology, neurology and orthopedics were consulted.                                                                  Hospital Course   Altered mental status, history of substance abuse, ongoing cocaine abuse.  Suspicious for possible seizure-like activity. By neurology, CT head nonacute, seizure-like activity thought to be due to history of ongoing cocaine abuse per neurology, MRI and EEG were nonacute, per neurology no AEDs required, abstain from using cocaine, abstain any  substance abuse, seizure instructions provided in writing.  Patient's mental status back to baseline.  Plan discussed with patient and patient's mother bedside.  Discharge with PCP and neurology outpatient follow-up.   End stage renal disease on dialysis [facility]) Patient with no volume overloaded, his K is 4.3 and BUN is 57, nephrology consulted on TTS schedule, HD on 09/01/2023.   Essential hypertension Start Norvasc along with as needed hydralazine, noncompliant with home medications   Left shoulder pain Positive deformity of left shoulder joint, it is tender to palpation and decreased range of movement.  Imaging suggests left clavicular fracture, orthopedics consulted over the phone request placing the left arm and shoulder in a sling, patient refusing to wear sling despite counseling, eventually wore a sling after much deliberation and counseling, will be discharged home with left shoulder sling, nonweightbearing eft arm and orthopedics follow-up in a week.  Will get some as needed NSAIDs cleared by Dr. Ronalee Belts nephrology.  Patient is established ESRD patient.   Schizoaffective disorder with Tardive dyskinesia.  Resume Ingrezza, Cogentin and Celexa, takes Paliperidone Palmitate ER shots every 3 months next due mid April.        HX of noncompliance with medications, noncompliance with therapeutic recommendations this admission, not wearing orthopedic sling in the left arm, signing out AMA in the past.  Counseled on compliance.    Discharge diagnosis     Principal Problem:   Altered mental status Active Problems:   End stage renal disease on dialysis [facility])   Essential hypertension   Left shoulder pain   Seizures (HCC)    Discharge instructions    Discharge Instructions     Discharge instructions   Complete by: As directed    Do not drive, operate heavy machinery, perform activities at heights, swimming or participation in water activities or provide baby sitting services until  you have seen by Primary MD or a Neurologist and advised to do so again.  Follow with Primary MD in 7 days   Get CBC, CMP, 2 view Chest X ray -  checked next visit with your primary MD    Activity: Keep your left shoulder in sling which is provided, no weightbearing in the left arm, further activity as tolerated with Full fall precautions use walker/cane & assistance as needed  Disposition Home   Diet: Renal diet with strict 1.2 L fluid restriction per day.  Special Instructions: If you have smoked or chewed Tobacco  in the last 2 yrs please stop smoking, stop any regular Alcohol  and or any Recreational drug use.  On your next visit with your primary care physician please Get Medicines reviewed and adjusted.  Please request your Prim.MD to go over all Hospital Tests and Procedure/Radiological results at the follow up, please get all Hospital records sent to your Prim MD by signing hospital release before you go home.  If you experience worsening of your admission symptoms, develop shortness of breath, life threatening emergency, suicidal or homicidal thoughts you must seek medical attention immediately by calling 911 or calling your MD immediately  if symptoms less severe.  You Must read complete instructions/literature along with all the possible adverse reactions/side effects for all the Medicines you take and that have been prescribed to you. Take any new Medicines after you have completely understood and accpet all the possible adverse reactions/side effects.   Do not drive when taking Pain medications.  Do not take more than prescribed Pain, Sleep and Anxiety Medications  Wear Seat belts while driving.   Increase activity slowly   Complete by: As directed        Discharge Medications   Allergies as of 09/02/2023   No Known Allergies      Medication List     TAKE these medications    acetaminophen 500 MG tablet Commonly known as: TYLENOL Take 1 tablet (500 mg total)  by mouth every 8 (eight) hours as needed for mild pain (pain score 1-3) or moderate pain (pain score 4-6) (or Fever >/= 101).   amLODipine 5 MG tablet Commonly known as: NORVASC Take 5 mg by mouth daily.   benztropine 1 MG tablet Commonly known as: COGENTIN Take 1 mg by mouth 2 (two) times daily.   cinacalcet 30 MG tablet Commonly known as: SENSIPAR Take 30 mg by mouth every evening.   citalopram 20 MG tablet Commonly known as: CELEXA Take 20 mg by mouth every morning.   HYDROcodone-acetaminophen 5-325 MG tablet Commonly  known as: NORCO/VICODIN Take 1 tablet by mouth every 12 (twelve) hours as needed for severe pain (pain score 7-10) (breakthrough).   ibuprofen 600 MG tablet Commonly known as: ADVIL Take 1 tablet (600 mg total) by mouth every 8 (eight) hours as needed (severe pain 7-10).   Ingrezza 80 MG capsule Generic drug: valbenazine Take 80 mg by mouth at bedtime.   Hinda Glatter Trinza 819 MG/2.63ML injection Generic drug: paliperidone Palmitate ER Inject 819 mg into the muscle every 3 (three) months.         Follow-up Information     [facility]. Schedule an appointment as soon as possible for a visit in 1 week(s).   Contact information: [address]     [address] [place] [address] [phone]        [facility]. Schedule an appointment as soon as possible for a visit in 1 week(s).   Contact information: [address] [place] [address] [phone]        Joen Laura, MD. Schedule an appointment as soon as possible for a visit in 1 week(s).   Specialty: Orthopedic Surgery Contact information: [address] [place] [address] [phone]                 Major procedures and Radiology Reports - PLEASE review detailed and final reports thoroughly  -     EEG adult Result Date: 09/02/2023 Charlsie Quest, MD      09/02/2023  7:47 AM Patient Name: CLEOTIS SPARR MRN: [id number] Epilepsy Attending: Charlsie Quest Referring Physician/Provider: Leroy Sea, MD Date: 09/01/2023 Duration: 26.54 mins Patient history: 38 y.o. 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 EEG to evaluate for seizure Level of alertness:  comatose/ lethargic AEDs during EEG study: None Technical aspects: This EEG study was done with scalp electrodes positioned according to the 10-20 International system of electrode placement. Electrical activity was reviewed with band pass filter of 1-70Hz , sensitivity of 7 uV/mm, display speed of 35mm/sec with a 60Hz  notched filter applied as appropriate. EEG data were recorded continuously and digitally stored.  Video monitoring was available and reviewed as appropriate. Description: EEG showed continuous generalized polymorphic mixed frequencies with predominantly 6 to 9 Hz theta-alpha activity admixed with intermittent 2-3hz  delta slowing. Hyperventilation and photic stimulation were not performed.   ABNORMALITY - Continuous slow, generalized IMPRESSION: This study is suggestive of moderate diffuse encephalopathy. No seizures or epileptiform discharges were seen throughout the recording. Charlsie Quest   MR BRAIN WO CONTRAST Result Date: 09/01/2023 CLINICAL DATA:  Mental status change, unknown cause Seizure, new-onset, no history of trauma EXAM: MRI HEAD WITHOUT CONTRAST TECHNIQUE: Multiplanar, multiecho pulse sequences of the brain and surrounding structures were obtained without intravenous contrast. COMPARISON:  CT head from August 31, 2023 FINDINGS: Brain: Moderately motion limited study. No evidence of acute  infarct, acute hemorrhage, mass lesion or midline shift. No hydrocephalus. Vascular: Major arterial flow voids are maintained. Skull and upper cervical spine: Normal marrow signal. Sinuses/Orbits: Mild paranasal sinus mucosal thickening. No acute orbital findings. Other: No mastoid effusions. IMPRESSION: Moderately motion limited study without evidence of acute intracranial abnormality. Electronically Signed   By:  Feliberto Harts M.D.   On: 09/01/2023 22:38   DG Shoulder 1V Left Result Date: 08/31/2023 CLINICAL DATA:  Left shoulder pain. EXAM: LEFT SHOULDER COMPARISON:  None Available. FINDINGS: Mildly displaced comminuted fracture is seen involving the distal right clavicle. No evidence of dislocation is seen involving right glenohumeral joint. IMPRESSION: Mildly displaced and comminuted distal right clavicular fracture. Electronically Signed   By: Lupita Raider M.D.   On: 08/31/2023 13:55   CT Head Wo Contrast Result Date: 08/31/2023 CLINICAL DATA:  Mental status change with unknown cause, possible seizure. EXAM: CT HEAD WITHOUT CONTRAST TECHNIQUE: Contiguous axial images were obtained from the base of the skull through the vertex without intravenous contrast. RADIATION DOSE REDUCTION: This exam was performed according to the departmental dose-optimization program which includes automated exposure control, adjustment of the mA and/or kV according to patient size and/or use of iterative reconstruction technique. COMPARISON:  02/09/2018 FINDINGS: Brain: No evidence of acute infarction, hemorrhage, hydrocephalus, extra-axial collection or mass lesion/mass effect. Vascular: No hyperdense vessel or unexpected calcification. Skull: Normal. Negative for fracture or focal lesion. Sinuses/Orbits: No acute finding. IMPRESSION: Negative head CT Electronically Signed   By: Tiburcio Pea M.D.   On: 08/31/2023 06:13   DG Chest Portable 1 View Result Date: 08/31/2023 CLINICAL DATA:  Altered mental status. EXAM:  PORTABLE CHEST 1 VIEW COMPARISON:  03/30/2022 FINDINGS: There is a right chest wall dialysis catheter with tips in the distal SVC. No pneumothorax identified. No pleural fluid, interstitial edema or airspace disease. The visualized osseous structures are unremarkable. IMPRESSION: 1. No acute findings. 2. Right chest wall dialysis catheter with tips in the distal SVC. Electronically Signed   By: Signa Kell M.D.   On: 08/31/2023 05:48    Micro Results    No results found for this or any previous visit (from the past 240 hours).  Today   Subjective    Ashwin Stueve today has no headache,no chest abdominal pain,no new weakness tingling or numbness, feels much better wants to go home today.    Objective   Blood pressure (!) 119/57, pulse 70, temperature 98.7 F (37.1 C), temperature source Oral, resp. rate 12, height 5\' 8"  (1.727 m), weight 82.2 kg, SpO2 96%.   Intake/Output Summary (Last 24 hours) at 09/02/2023 1023 Last data filed at 09/01/2023 2215 Gross per 24 hour  Intake 60 ml  Output 2000 ml  Net -1940 ml    Exam  Awake Alert, No new F.N deficits, left shoulder in sling, [place].AT,PERRAL Supple Neck,   Symmetrical Chest wall movement, Good air movement bilaterally, CTAB RRR,No Gallops,   +ve B.Sounds, Abd Soft, Non tender,  No Cyanosis, Clubbing or edema    Data Review   Recent Labs  Lab 08/31/23 0520 08/31/23 1625 09/01/23 0813  WBC 7.4 8.0 8.0  HGB 10.0* 10.2* 9.8*  HCT 31.9* 31.4* 30.5*  PLT 210 195 196  MCV 94.4 91.5 92.7  MCH 29.6 29.7 29.8  MCHC 31.3 32.5 32.1  RDW 15.3 15.3 15.3  LYMPHSABS 1.4  --   --   MONOABS 0.6  --   --   EOSABS 0.1  --   --   BASOSABS 0.1  --   --     Recent Labs  Lab 08/31/23 0520 08/31/23 1625 09/01/23 0813  NA 141  --  141  K 4.3  --  4.2  CL 103  --  107  CO2 14*  --  20*  ANIONGAP 24*  --  14  GLUCOSE 104*  --  93  BUN 57*  --  61*  CREATININE 10.81* 11.35* 11.43*  AST 26  --   --   ALT 27  --   --    ALKPHOS 73  --   --   BILITOT 0.6  --   --   ALBUMIN 3.7  --   --   CALCIUM 8.9  --  8.7*    Total Time in preparing paper work, data evaluation and todays exam - 35 minutes  Signature  -    Susa Raring M.D on 09/02/2023 at 10:23 AM   -  To page go to [email or web address]

## 2023-09-02 NOTE — Plan of Care (Signed)
  Problem: Education: Goal: Knowledge of General Education information will improve Description: Including pain rating scale, medication(s)/side effects and non-pharmacologic comfort measures Outcome: Progressing   Problem: Health Behavior/Discharge Planning: Goal: Ability to manage health-related needs will improve Outcome: Progressing   Problem: Nutrition: Goal: Adequate nutrition will be maintained Outcome: Progressing   Problem: Coping: Goal: Level of anxiety will decrease Outcome: Progressing   Problem: Pain Managment: Goal: General experience of comfort will improve and/or be controlled Outcome: Progressing   Problem: Safety: Goal: Ability to remain free from injury will improve Outcome: Progressing

## 2023-09-02 NOTE — Progress Notes (Signed)
  [facility] Progress Note   Subjective: seen in room actually talking and answering questions appropriately. Talking with his mother and MSW. Actually smiled! Discharge home today. Reminded to go to OP HD 09/04/2023  Objective Vitals:   09/01/23 2300 09/02/23 0000 09/02/23 0043 09/02/23 0200  BP: (!) 165/71 (!) 163/77 (!) 119/57   Pulse: 95 87 76 70  Resp: 16 14 13 12   Temp:      TempSrc:      SpO2: 97% 96% 95% 96%  Weight:      Height:       General: Chronically ill appearing male in NAD Head: Normocephalic, atraumatic Neck: Supple. JVD not elevated. Lungs: CTAB posteriorly Heart: RRR  Abdomen: NABS, NT Lower extremities:No LE edema. Sling L shoulder. Guarding L shoulder.  Neuro: today oriented X 3 Psych:  talking with mother and MSW-conversation is appropriate Dialysis Access: [facility] CDI. L AVF +T/B  Additional Objective Labs: Basic Metabolic Panel: Recent Labs  Lab 08/31/23 0520 08/31/23 1625 09/01/23 0813  NA 141  --  141  K 4.3  --  4.2  CL 103  --  107  CO2 14*  --  20*  GLUCOSE 104*  --  93  BUN 57*  --  61*  CREATININE 10.81* 11.35* 11.43*  CALCIUM 8.9  --  8.7*   Liver Function Tests: Recent Labs  Lab 08/31/23 0520  AST 26  ALT 27  ALKPHOS 73  BILITOT 0.6  PROT 6.4*  ALBUMIN 3.7   No results for input(s): "LIPASE", "AMYLASE" in the last 168 hours. CBC: Recent Labs  Lab 08/31/23 0520 08/31/23 1625 09/01/23 0813  WBC 7.4 8.0 8.0  NEUTROABS 5.2  --   --   HGB 10.0* 10.2* 9.8*  HCT 31.9* 31.4* 30.5*  MCV 94.4 91.5 92.7  PLT 210 195 196   Blood Culture No results found for: "SDES", "SPECREQUEST", "CULT", "REPTSTATUS"  Cardiac Enzymes: Recent Labs  Lab 08/31/23 0520  CKTOTAL 685*   CBG: Recent Labs  Lab 08/31/23 0505  GLUCAP 94   Iron Studies: No results for input(s): "IRON", "TIBC", "TRANSFERRIN", "FERRITIN" in the last 72 hours. @lablastinr3 @ Studies/Results: EEG adult Result Date: 09/02/2023 Charlsie Quest, MD     09/02/2023  7:47 AM Patient Name: William Gregory MRN: [id number] Epilepsy Attending: Charlsie Quest Referring Physician/Provider: Leroy Sea, MD Date: 09/01/2023 Duration: 26.54 mins Patient history: 38 y.o. 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 EEG to evaluate for seizure Level of alertness:  comatose/ lethargic AEDs during EEG study: None Technical aspects: This EEG study was done with scalp electrodes positioned according to the 10-20 International system of electrode placement. Electrical activity was reviewed with band pass filter of 1-70Hz , sensitivity of 7 uV/mm, display speed of 15mm/sec with a 60Hz  notched filter applied as appropriate. EEG data were recorded continuously and digitally stored.  Video monitoring was available and reviewed as appropriate. Description: EEG showed continuous generalized polymorphic mixed frequencies with predominantly 6 to 9 Hz theta-alpha activity admixed with intermittent 2-3hz  delta slowing. Hyperventilation and photic stimulation were not performed.   ABNORMALITY - Continuous slow, generalized IMPRESSION: This study is suggestive of moderate diffuse encephalopathy. No seizures or epileptiform discharges were seen throughout the recording.  Charlsie Quest   MR BRAIN WO CONTRAST Result Date: 09/01/2023 CLINICAL DATA:  Mental status change, unknown cause Seizure, new-onset, no history of trauma EXAM: MRI HEAD WITHOUT CONTRAST TECHNIQUE: Multiplanar, multiecho pulse sequences of the brain and surrounding structures were obtained without intravenous contrast. COMPARISON:  CT head from August 31, 2023 FINDINGS: Brain: Moderately motion limited study. No  evidence of acute infarct, acute hemorrhage, mass lesion or midline shift. No hydrocephalus. Vascular: Major arterial flow voids are maintained. Skull and upper cervical spine: Normal marrow signal. Sinuses/Orbits: Mild paranasal sinus mucosal thickening. No acute orbital findings. Other: No mastoid effusions. IMPRESSION: Moderately motion limited study without evidence of acute intracranial abnormality. Electronically Signed   By: Feliberto Harts M.D.   On: 09/01/2023 22:38   DG Shoulder 1V Left Result Date: 08/31/2023 CLINICAL DATA:  Left shoulder pain. EXAM: LEFT SHOULDER COMPARISON:  None Available. FINDINGS: Mildly displaced comminuted fracture is seen involving the distal right clavicle. No evidence of dislocation is seen involving right glenohumeral joint. IMPRESSION: Mildly displaced and comminuted distal right clavicular fracture. Electronically Signed   By: Lupita Raider M.D.   On: 08/31/2023 13:55   Medications:   amLODipine  10 mg Oral Daily   benztropine  1 mg Oral BID   calcitRIOL  1.25 mcg Oral Q T,Th,Sa-HD   Chlorhexidine Gluconate Cloth  6 each Topical Q0600   cinacalcet  30 mg Oral QPM   citalopram  20 mg Oral q morning   heparin  5,000 Units Subcutaneous Q8H   hydrALAZINE  50 mg Oral Q8H   valbenazine  80 mg Oral QHS     Dialysis Orders: [facility] T,Th,S 4 hrs 180NRe 400/600 83 kg 2.0 K/ 2.0 Ca TDC/AVF - Heparin 3400 units IV - Venofer 50 mg IV q week - Mircera 60 mcg IV q 2 weeks (last dose 08/25/2023) - Calcitriol 1.25 mcg PO three times per week     Assessment/Plan:  AMS-work up per primary. CT of head unremarkable. BUN 57 missed HD treatments. Also H/O polysubstance abuse and seizure disorder L shoulder pain-L clavicular fx. Wearing sling. Per primary Schizoaffective disorder-psych meds resumed.   ESRD -  T, Th, S-next HD 09/04/2023  Hypertension/volume  - CXR unremarkable. Does not appear volume overloaded by exam. Close to OP EDW 08/28/2023.UF as tolerated. Net  UF 2.0 L with HD 09/01/2023  Anemia  - HGB 10.0. Recent ESA. Follow HGB  Metabolic bone disease -  Add PO4 to labs. Continue velphoro binder, VDRA, sensipar  Nutrition - Currently NPO. Albumin 3.7.  Seizure disorder-no AED on med list.  Dene Gentry. Hue Steveson NP-C 09/02/2023, 11:31 AM  [employer] [phone]

## 2023-09-02 NOTE — Procedures (Signed)
 Patient Name: William Gregory  MRN: [id number]  Epilepsy Attending: Charlsie Quest  Referring Physician/Provider: Leroy Sea, MD  Date: 09/01/2023 Duration: 26.54 mins  Patient history: 38 y.o. 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 EEG to evaluate for seizure  Level of alertness:  comatose/ lethargic   AEDs during EEG study: None  Technical aspects: This EEG study was done with scalp electrodes positioned according to the 10-20 International system of electrode placement. Electrical activity was reviewed with band pass filter of 1-70Hz , sensitivity of 7 uV/mm, display speed of 59mm/sec with a 60Hz  notched filter applied as appropriate. EEG data were recorded continuously and digitally stored.  Video monitoring was available and reviewed as appropriate.  Description: EEG showed continuous generalized polymorphic mixed frequencies with predominantly 6 to 9 Hz theta-alpha activity admixed with intermittent 2-3hz  delta slowing. Hyperventilation and photic stimulation were not performed.     ABNORMALITY - Continuous slow, generalized  IMPRESSION: This study is suggestive of moderate diffuse encephalopathy. No seizures or epileptiform discharges were seen throughout the recording.  Vanessia Bokhari Annabelle Harman

## 2023-09-02 NOTE — Progress Notes (Signed)
 MRI personally reviewed:  "Moderately motion limited study without evidence of acute intracranial abnormality."  EEG: Continuous slow, generalized This study is suggestive of moderate diffuse encephalopathy. No seizures or epileptiform discharges were seen throughout the recording.   No evidence of seizure focus on these studies As before, suggest strict abstinence from cocaine Neurology will sign off, please reach out if other questions/concerns arise, discussed with primary team in person  Brooke Dare MD-PhD [facility] [phone] Available 7 AM to 7 PM, outside these hours please contact Neurologist on call listed on AMION

## 2023-09-02 NOTE — Discharge Instructions (Signed)
 Do not drive, operate heavy machinery, perform activities at heights, swimming or participation in water activities or provide baby sitting services until you have seen by Primary MD or a Neurologist and advised to do so again.  Follow with Primary MD in 7 days   Get CBC, CMP, 2 view Chest X ray -  checked next visit with your primary MD    Activity: Keep your left shoulder in sling which is provided, no weightbearing in the left arm, further activity as tolerated with Full fall precautions use walker/cane & assistance as needed  Disposition Home   Diet: Renal diet with strict 1.2 L fluid restriction per day.  Special Instructions: If you have smoked or chewed Tobacco  in the last 2 yrs please stop smoking, stop any regular Alcohol  and or any Recreational drug use.  On your next visit with your primary care physician please Get Medicines reviewed and adjusted.  Please request your Prim.MD to go over all Hospital Tests and Procedure/Radiological results at the follow up, please get all Hospital records sent to your Prim MD by signing hospital release before you go home.  If you experience worsening of your admission symptoms, develop shortness of breath, life threatening emergency, suicidal or homicidal thoughts you must seek medical attention immediately by calling 911 or calling your MD immediately  if symptoms less severe.  You Must read complete instructions/literature along with all the possible adverse reactions/side effects for all the Medicines you take and that have been prescribed to you. Take any new Medicines after you have completely understood and accpet all the possible adverse reactions/side effects.   Do not drive when taking Pain medications.  Do not take more than prescribed Pain, Sleep and Anxiety Medications  Wear Seat belts while driving.

## 2023-09-02 NOTE — TOC Transition Note (Signed)
 Transition of Care [facility]) - Discharge Note   Patient Details  Name: KIET GEER MRN: [id number] Date of Birth: 1985-11-16  Transition of Care [facility]) CM/SW Contact:  Donnalee Curry, LCSWA Phone Number: 09/02/2023, 11:43 AM   Clinical Narrative:     SW informed pt's mother requesting shelter resources.   SW met with pt and mother at bedside. Pt did not participate in conversation. Pt's mother reports pt has been outside for about 1 month. Previously was living with gf but not able to return. Pt is not able to stay with mother due to her living in senior community. Pt's brother's do not have room for pt and pt does not get along with his father. Pt has 2 daughters and 2 grandsons (14 months/7 months), not able to stay with them. Pt's mother manages his money while he is staying outside. SW provided information on area shelters and rooming houses.    Final next level of care: Home/Self Care Barriers to Discharge: Barriers Resolved   Patient Goals and CMS Choice            Discharge Placement                       Discharge Plan and Services Additional resources added to the After Visit Summary for                                       Social Drivers of Health (SDOH) Interventions SDOH Screenings   Food Insecurity: Patient Unable To Answer (09/01/2023)  Housing: Patient Unable To Answer (09/01/2023)  Transportation Needs: Patient Unable To Answer (09/01/2023)  Utilities: Patient Unable To Answer (09/01/2023)     Readmission Risk Interventions     No data to display

## 2023-09-02 NOTE — Discharge Planning (Signed)
 [facility] Patient Discharge Orders- [facility] CLINIC: [facility]  Patient's name: William Gregory Admit/DC Dates: 08/31/2023 - 09/02/2023  Discharge Diagnoses: AMS  Fx L clavicle  Aranesp: Given: No   Date and amount of last dose: NA  Last Hgb: 9.8 PRBC's Given: No Date/# of units: NA ESA dose for discharge: mircera 60 mcg IV q 2 weeks  IV Iron dose at discharge: per protocol-continue current order  Heparin change: No  EDW Change: Yes New EDW: 82.5 kg  Bath Change: NO  Access intervention/Change: No. Mother is aware TDC needs to be removed but now with Fx L clavicle with L arm in sling Details:  Hectorol/Calcitriol change: No  Discharge Labs: Calcium 8.7 Phosphorus NA Albumin 3.7 K+ 4.2  IV Antibiotics: NA Details:  On Coumadin?: NA Last INR: Next INR: Managed By:   OTHER/APPTS/LAB ORDERS:    D/C Meds to be reconciled by nurse after every discharge.  Completed By: Alonna Buckler [facility] [phone]    Reviewed by: MD:______ RN_______

## 2023-09-03 NOTE — Progress Notes (Signed)
 Late Note Entry- September 03, 2023  Pt was d/c yesterday. Contacted [facility] this morning to be advised of pt's d/c date and that pt should resume care tomorrow.   Olivia Canter Renal Navigator [phone]

## 2023-09-10 LAB — DRUG SCREEN 10 W/CONF, SERUM
Amphetamines, IA: NEGATIVE ng/mL
Barbiturates, IA: NEGATIVE ug/mL
Benzodiazepines, IA: NEGATIVE ng/mL
Cocaine & Metabolite, IA: POSITIVE ng/mL — AB
Methadone, IA: NEGATIVE ng/mL
Opiates, IA: NEGATIVE ng/mL
Oxycodones, IA: NEGATIVE ng/mL
Phencyclidine, IA: NEGATIVE ng/mL
Propoxyphene, IA: NEGATIVE ng/mL
THC(Marijuana) Metabolite, IA: NEGATIVE ng/mL

## 2023-09-28 NOTE — Progress Notes (Signed)
 Assessment/Plan:   1.  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  Restart ingrezza  40 mg daily x 1 week and then increase to previous 80 mg daily.  R/b/se discussed again.  Each time he comes, he is not on the medication because of social issues.  They want us  to call his mom but ship to dad.  2.  End-stage renal disease  -On hemodialysis  3.  History of cocaine abuse  - Patient had seizure August 02, 2023 and urine drug screen positive for cocaine.  - Patient was back in the hospital with mental status change August 31, 2023 and drug screen again positive for cocaine.  His mother aware and we had a long discussion about this.  We discussed NA but he is not willing.    4.  Seizure  - Likely secondary to cocaine.  EEGs have not shown epileptiform activity.  All seizures have been associated with positive drug screens for cocaine and he knows that.   Subjective:   William Gregory was seen today in follow up for tardive dyskinesia, due to Invega.  My previous records as well as any outside records available were reviewed prior to todays visit.  Pt with mother who supplements hx.  I restarted his Ingrezza  last visit.  Patient went to the hospital February 27 for reported seizure.  Notes state that EMS was called for altered mental status and jerking.  When they arrived he was having involuntary movements and they described him as spontaneously jerking his arms and legs, but while they were there he became unresponsive and had a tonic-clonic seizure lasting 45 seconds with urinary incontinence.  Rapid drug screen was positive for cocaine.  He signed himself out AMA.  Patient was back in the hospital March 28 with altered mental status and possible seizure.  Patient had left clavicular fracture.  Again, he was positive for cocaine.  EEG  was done which just demonstrated slowing.  MRI brain with motion artifact, but was nonacute.  They report that NP at [facility] put him on cogentin  for drooling.  They don't have the ingrezza  in his bag today.  They don't think he is on that.  When I showed him what it looked like he said "I don't got that."  He no longer has the old phone and he previously told us  to call his girlfriend but now they want us  to call his mom but he lives with his dad.  Tremor is better.  His mouth is still better.    CURRENT MEDICATIONS:  Outpatient Encounter Medications as of 10/02/2023  Medication Sig   acetaminophen  (TYLENOL ) 500 MG tablet Take 500 mg by mouth every 6 (six) hours as needed for moderate pain.   acetaminophen  (TYLENOL ) 500 MG tablet Take 1 tablet (500 mg total) by mouth every 8 (eight) hours as needed for mild pain (pain score 1-3) or moderate pain (pain score 4-6) (or Fever >/= 101).   albuterol  (VENTOLIN  HFA) 108 (90 Base) MCG/ACT inhaler Inhale 2 puffs into the lungs every 4 (four) hours as needed for wheezing or shortness of breath.   amLODipine  (NORVASC ) 5 MG tablet TAKE 1 TABLET(5 MG) BY MOUTH DAILY   amLODipine  (NORVASC ) 5 MG tablet Take 5 mg by mouth daily.   amLODipine  (NORVASC ) 5 MG tablet Take 5 mg by  mouth daily.   benztropine  (COGENTIN ) 1 MG tablet Take 1 mg by mouth 2 (two) times daily.   benztropine  (COGENTIN ) 1 MG tablet Take 1 mg by mouth 2 (two) times daily.   chlorhexidine  (PERIDEX ) 0.12 % solution Use as directed 15 mLs in the mouth or throat 2 (two) times daily. Gargle with 15 mL after meals and at bedtime   cinacalcet  (SENSIPAR ) 30 MG tablet Take 30 mg by mouth every evening.   cinacalcet  (SENSIPAR ) 30 MG tablet Take 30 mg by mouth every evening.   citalopram  (CELEXA ) 20 MG tablet Take 1 tablet (20 mg total) by mouth every morning.   citalopram  (CELEXA ) 20 MG tablet Take 20 mg by mouth daily.   citalopram  (CELEXA ) 20 MG tablet Take 20 mg by mouth every morning.    cyclobenzaprine  (FLEXERIL ) 5 MG tablet Take 1 tablet (5 mg total) by mouth 3 (three) times daily as needed for muscle spasms.   diclofenac (VOLTAREN) 75 MG EC tablet Take 75 mg by mouth 2 (two) times daily as needed (pain).   fluticasone  (FLONASE ) 50 MCG/ACT nasal spray Place 2 sprays into both nostrils daily.   HYDROcodone -acetaminophen  (NORCO/VICODIN) 5-325 MG tablet Take 1 tablet by mouth every 12 (twelve) hours as needed for severe pain (pain score 7-10) (breakthrough).   ibuprofen  (ADVIL ) 600 MG tablet Take 1 tablet (600 mg total) by mouth every 8 (eight) hours as needed (severe pain 7-10).   INGREZZA  80 MG capsule Take one capsule by mouth daily   paliperidone Palmitate ER (INVEGA TRINZA) 819 MG/2.63ML injection Inject 819 mg into the muscle every 3 (three) months.   sevelamer carbonate (RENVELA) 800 MG tablet Take 2,400 mg by mouth 3 (three) times daily with meals.   valbenazine  (INGREZZA ) 40 MG capsule Take 40 mg by mouth daily.   valbenazine  (INGREZZA ) 40 MG capsule Samples of this drug were given to the patient, quantity 1, Lot Number [id number]   valbenazine  (INGREZZA ) 80 MG capsule Samples of this drug were given to the patient, quantity 1, Lot Number [id number]   valbenazine  (INGREZZA ) 80 MG capsule Take 1 capsule (80 mg total) by mouth daily.   valbenazine  (INGREZZA ) 80 MG capsule Take 80 mg by mouth daily.   valbenazine  (INGREZZA ) 80 MG capsule Take 80 mg by mouth at bedtime.   No facility-administered encounter medications on file as of 10/02/2023.     Objective:   PHYSICAL EXAMINATION:    VITALS:   Vitals:   10/02/23 0804  BP: 132/88  Pulse: 98  SpO2: 99%  Weight: 181 lb 3.2 oz (82.2 kg)     GEN:  The patient appears stated age and is in NAD. HEENT:  Normocephalic, atraumatic.  The mucous membranes are moist. The superficial temporal arteries are without ropiness or tenderness. CV:  RRR Lungs:  CTAB Neck/HEME:  There are no carotid bruits bilaterally.  Neurological  examination:  Orientation: The patient is alert and oriented x3.  He is younger than stated age Cranial nerves: There is good facial symmetry.The speech is fluent and clear. Soft palate rises symmetrically and there is no tongue deviation. Hearing is intact to conversational tone. Sensation: Sensation is intact to light touch throughout Motor: Strength is at least antigravity x4.  Movement examination: Tone: There is normal tone in the UE/LE Abnormal movements:  no tremor.  No myoclonus.  No asterixis.  He has some dyskinetic movement in the L leg.  He has some mild mouth/tongue movement but it stays within the mouth.  This  is stable from prior. Coordination:  There is no decremation with RAM's. Gait and Station: The patient has no difficulty arising out of a deep-seated chair without the use of the hands. The patient's stride length is good.     Cc:  Paseda, Folashade R, FNP

## 2023-10-02 ENCOUNTER — Telehealth: Payer: Self-pay | Admitting: Pharmacy Technician

## 2023-10-02 ENCOUNTER — Telehealth: Payer: Self-pay

## 2023-10-02 ENCOUNTER — Ambulatory Visit: Payer: MEDICAID | Admitting: Neurology

## 2023-10-02 ENCOUNTER — Other Ambulatory Visit (HOSPITAL_COMMUNITY): Payer: Self-pay

## 2023-10-02 VITALS — BP 132/88 | HR 98 | Wt 181.2 lb

## 2023-10-02 DIAGNOSIS — G2401 Drug induced subacute dyskinesia: Secondary | ICD-10-CM | POA: Diagnosis not present

## 2023-10-02 DIAGNOSIS — F1410 Cocaine abuse, uncomplicated: Secondary | ICD-10-CM | POA: Diagnosis not present

## 2023-10-02 NOTE — Telephone Encounter (Signed)
 Pharmacy Patient Advocate Encounter  Received notification from [facility] that Prior Authorization for INGREZZA   has been CANCELLED due to

## 2023-10-02 NOTE — Telephone Encounter (Signed)
 PA has been submitted, and telephone encounter has been created. Please see telephone encounter dated 4.29.25.

## 2023-10-02 NOTE — Telephone Encounter (Signed)
 Patient needs a new PA for Ingrezza  80 mg daily at bedtime please

## 2023-10-02 NOTE — Telephone Encounter (Signed)
 Pharmacy Patient Advocate Encounter   Received notification from Pt Calls Messages that prior authorization for INGREZZA  80MG  is required/requested.   Insurance verification completed.   The patient is insured through [facility] .   Per test claim: PA required; PA submitted to above mentioned insurance via CoverMyMeds Key/confirmation #/EOC [id number] Status is pending

## 2023-10-03 ENCOUNTER — Other Ambulatory Visit: Payer: Self-pay | Admitting: Nurse Practitioner

## 2023-10-03 ENCOUNTER — Inpatient Hospital Stay: Payer: Self-pay | Admitting: Nurse Practitioner

## 2023-10-18 ENCOUNTER — Telehealth: Payer: Self-pay

## 2023-10-18 NOTE — Telephone Encounter (Signed)
 Copied from CRM [id number]. Topic: Clinical - Prescription Issue >> Oct 18, 2023  1:19 PM Lizabeth Riggs wrote: Reason for CRM:  [facility] needs a list of his medications with refills faxed to [phone]. I show on his chart that this pharmacy is on his list but not listed as his first choice.

## 2023-10-19 NOTE — Telephone Encounter (Signed)
 Done [facility]

## 2023-10-23 ENCOUNTER — Other Ambulatory Visit: Payer: Self-pay | Admitting: Nurse Practitioner

## 2023-10-23 DIAGNOSIS — M549 Dorsalgia, unspecified: Secondary | ICD-10-CM

## 2023-10-23 DIAGNOSIS — M25512 Pain in left shoulder: Secondary | ICD-10-CM

## 2023-10-23 DIAGNOSIS — J45909 Unspecified asthma, uncomplicated: Secondary | ICD-10-CM

## 2023-10-23 MED ORDER — IBUPROFEN 600 MG PO TABS: 600.0000 mg | ORAL_TABLET | Freq: Three times a day (TID) | ORAL | 0 refills | Status: AC | PRN

## 2023-10-23 MED ORDER — FLUTICASONE PROPIONATE 50 MCG/ACT NA SUSP: 2.0000 | Freq: Every day | NASAL | 0 refills | Status: DC

## 2023-10-23 MED ORDER — CYCLOBENZAPRINE HCL 5 MG PO TABS: 5.0000 mg | ORAL_TABLET | Freq: Three times a day (TID) | ORAL | 0 refills | Status: DC | PRN

## 2023-10-23 MED ORDER — ALBUTEROL SULFATE HFA 108 (90 BASE) MCG/ACT IN AERS: 2.0000 | INHALATION_SPRAY | RESPIRATORY_TRACT | 1 refills | Status: DC | PRN

## 2023-10-23 NOTE — Telephone Encounter (Signed)
 Copied from CRM [id number]. Topic: Clinical - Medication Refill >> Oct 23, 2023 10:21 AM Fonda T wrote: Medication:  benztropine  (COGENTIN ) 1 MG tablet  cyclobenzaprine  (FLEXERIL ) 5 MG tablet  diclofenac (VOLTAREN) 75 MG EC tablet  fluticasone  (FLONASE ) 50 MCG/ACT nasal spray  ibuprofen  (ADVIL ) 600 MG tablet  albuterol  (VENTOLIN  HFA) 108 (90 Base) MCG/ACT inhaler  Has the patient contacted their pharmacy? Yes (Agent: If no, request that the patient contact the pharmacy for the refill. If patient does not wish to contact the pharmacy document the reason why and proceed with request.) (Agent: If yes, when and what did the pharmacy advise?)  This is the patient's preferred pharmacy:   [facility] - [place], [place] - [address] [place] [address] Phone: [phone] Fax: [phone]  Is this the correct pharmacy for this prescription? Yes If no, delete pharmacy and type the correct one.   Has the prescription been filled recently? No  Is the patient out of the medication? Yes  Has the patient been seen for an appointment in the last year OR does the patient have an upcoming appointment? No Last office visit was 10/2022  Can we respond through MyChart? No  Agent: Please be advised that Rx refills may take up to 3 business days. We ask that you follow-up with your pharmacy.   Dominique calling from [facility] requesting medication refills on behalf of the patient. Please call with any further inquiries at [phone]

## 2023-10-23 NOTE — Telephone Encounter (Signed)
 Please advise [facility]

## 2023-11-12 ENCOUNTER — Emergency Department (HOSPITAL_COMMUNITY): Payer: MEDICAID

## 2023-11-12 ENCOUNTER — Emergency Department (HOSPITAL_COMMUNITY)
Admission: EM | Admit: 2023-11-12 | Discharge: 2023-11-12 | Disposition: A | Discharge status: Home / Self Care | Payer: MEDICAID | Attending: Emergency Medicine | Admitting: Emergency Medicine

## 2023-11-12 DIAGNOSIS — Z8669 Personal history of other diseases of the nervous system and sense organs: Secondary | ICD-10-CM | POA: Insufficient documentation

## 2023-11-12 DIAGNOSIS — I120 Hypertensive chronic kidney disease with stage 5 chronic kidney disease or end stage renal disease: Secondary | ICD-10-CM | POA: Insufficient documentation

## 2023-11-12 DIAGNOSIS — Z992 Dependence on renal dialysis: Secondary | ICD-10-CM | POA: Insufficient documentation

## 2023-11-12 DIAGNOSIS — Z79899 Other long term (current) drug therapy: Secondary | ICD-10-CM | POA: Diagnosis not present

## 2023-11-12 DIAGNOSIS — N186 End stage renal disease: Secondary | ICD-10-CM | POA: Insufficient documentation

## 2023-11-12 DIAGNOSIS — R030 Elevated blood-pressure reading, without diagnosis of hypertension: Secondary | ICD-10-CM

## 2023-11-12 DIAGNOSIS — I10 Essential (primary) hypertension: Secondary | ICD-10-CM

## 2023-11-12 DIAGNOSIS — R4182 Altered mental status, unspecified: Secondary | ICD-10-CM | POA: Diagnosis not present

## 2023-11-12 DIAGNOSIS — Z87898 Personal history of other specified conditions: Secondary | ICD-10-CM

## 2023-11-12 LAB — COMPREHENSIVE METABOLIC PANEL WITH GFR
ALT: 14 U/L (ref 0–44)
AST: 21 U/L (ref 15–41)
Albumin: 3.6 g/dL (ref 3.5–5.0)
Alkaline Phosphatase: 66 U/L (ref 38–126)
Anion gap: 22 — ABNORMAL HIGH (ref 5–15)
BUN: 44 mg/dL — ABNORMAL HIGH (ref 6–20)
CO2: 16 mmol/L — ABNORMAL LOW (ref 22–32)
Calcium: 9.7 mg/dL (ref 8.9–10.3)
Chloride: 104 mmol/L (ref 98–111)
Creatinine, Ser: 13.74 mg/dL — ABNORMAL HIGH (ref 0.61–1.24)
GFR, Estimated: 4 mL/min — ABNORMAL LOW (ref >60)
Glucose, Bld: 103 mg/dL — ABNORMAL HIGH (ref 70–99)
Potassium: 4.3 mmol/L (ref 3.5–5.1)
Sodium: 142 mmol/L (ref 135–145)
Total Bilirubin: 0.6 mg/dL (ref 0.0–1.2)
Total Protein: 6.6 g/dL (ref 6.5–8.1)

## 2023-11-12 LAB — ETHANOL: Alcohol, Ethyl (B): <15 mg/dL (ref <15)

## 2023-11-12 LAB — CBC
HCT: 38.9 % — ABNORMAL LOW (ref 39.0–52.0)
Hemoglobin: 12.1 g/dL — ABNORMAL LOW (ref 13.0–17.0)
MCH: 30.7 pg (ref 26.0–34.0)
MCHC: 31.1 g/dL (ref 30.0–36.0)
MCV: 98.7 fL (ref 80.0–100.0)
Platelets: 236 10*3/uL (ref 150–400)
RBC: 3.94 MIL/uL — ABNORMAL LOW (ref 4.22–5.81)
RDW: 14.1 % (ref 11.5–15.5)
WBC: 6.2 10*3/uL (ref 4.0–10.5)
nRBC: 0 % (ref 0.0–0.2)

## 2023-11-12 LAB — RAPID URINE DRUG SCREEN, HOSP PERFORMED
Amphetamines: NOT DETECTED
Barbiturates: NOT DETECTED
Benzodiazepines: NOT DETECTED
Cocaine: POSITIVE — AB
Opiates: NOT DETECTED
Tetrahydrocannabinol: NOT DETECTED

## 2023-11-12 LAB — CBG MONITORING, ED: Glucose-Capillary: 94 mg/dL (ref 70–99)

## 2023-11-12 NOTE — ED Provider Notes (Signed)
 Patient becoming more responsive, suspected post ictal or drug ingestion. If getting back to baseline anticipate. Physical Exam  BP (!) 160/92   Pulse (!) 59   Temp (!) 97.2 F (36.2 C)   Resp 14   Ht 5\' 8"  (1.727 m)   Wt 82.2 kg   SpO2 100%   BMI 27.55 kg/m   Physical Exam  Procedures  Procedures  ED Course / MDM    Medical Decision Making Amount and/or Complexity of Data Reviewed Labs: ordered. Radiology: ordered.  19:25 patient has been up and ambulatory to the bathroom several times.  He lies back down and rest but is easily rousable now.  Patient can roll over and talk to me.  No signs of confusion.  At this time I have extensively discussed the case with the patient and his mother at bedside.  Patient does think he missed some doses of his regular medications.  He denies any drug use at this time although patient's mother reports that this has been a factor in previous similar presentations.  Currently patient is stable for discharge wanting to eat and ambulatory back to essentially baseline function.  We reviewed the importance of compliance with medications and avoidance of any other drugs of abuse that might trigger similar problems.        Wynetta Heckle, MD 11/12/23 [phone]

## 2023-11-12 NOTE — ED Notes (Signed)
 Pt able to ambulate with 1 assist to get something to drink.

## 2023-11-12 NOTE — ED Notes (Signed)
 Pt a little more awake. Able to ambulate with minimal assist to the bathroom. Given something to eat per request.

## 2023-11-12 NOTE — ED Triage Notes (Addendum)
 Pt BIB EMS from dads house, said patient has been acting lethargic and abnormal with new onset twitching and tremors since last night. Pt had witnessed seizure around 1100. Postictal since seizure. Pt opens eyes to voices but not responsive. Dialysis pt MWF, has not missed any dialysis lately. Hx of seizures.

## 2023-11-12 NOTE — ED Provider Notes (Addendum)
 [place] EMERGENCY DEPARTMENT AT [facility] Provider Note   CSN: [id number] Arrival date & time: 11/12/23  1124     History  Chief Complaint  Patient presents with  . Seizures  . Fatigue    William Gregory is a 38 y.o. male.  Pt with hs esrd/hd, schizophrenia w hx tardive dyskinesia, presents via EMS where patient noted with decreased activity level, decreased responsiveness, and occasional twitching/tremulousness since last evening. Pt very limited historian - level 5 caveat, not answering questions asked, but is moving bilateral extremities purposefully.  No report of trauma/fall. No report of seizures. Possible seizure noted around 1100 today. Per report has not missed any dialysis sessions.   The history is provided by the patient, the EMS personnel and medical records. The history is limited by the condition of the patient.  Seizures      Home Medications Prior to Admission medications   Medication Sig Start Date End Date Taking? Authorizing Provider  acetaminophen  (TYLENOL ) 500 MG tablet Take 500 mg by mouth every 6 (six) hours as needed for moderate pain.    [provider]  acetaminophen  (TYLENOL ) 500 MG tablet Take 1 tablet (500 mg total) by mouth every 8 (eight) hours as needed for mild pain (pain score 1-3) or moderate pain (pain score 4-6) (or Fever >/= 101). 09/02/23   Singh, Prashant K, MD  albuterol  (VENTOLIN  HFA) 108 [phone] Base) MCG/ACT inhaler Inhale 2 puffs into the lungs every 4 (four) hours as needed for wheezing or shortness of breath. 10/23/23   Paseda, Folashade R, FNP  amLODipine  (NORVASC ) 5 MG tablet TAKE 1 TABLET(5 MG) BY MOUTH DAILY 07/25/23   Paseda, Folashade R, FNP  amLODipine  (NORVASC ) 5 MG tablet TAKE ONE TABLET BY MOUTH EVERY DAY 10/03/23   Nichols, Tonya S, NP  amLODipine  (NORVASC ) 5 MG tablet Take 5 mg by mouth daily.    [provider]  amLODipine  (NORVASC ) 5 MG tablet Take 5 mg by mouth daily. 06/15/23   [provider]  benztropine  (COGENTIN ) 1 MG tablet Take 1 mg by mouth 2 (two) times daily.    [provider]  benztropine  (COGENTIN ) 1 MG tablet Take 1 mg by mouth 2 (two) times daily.    [provider]  chlorhexidine  (PERIDEX ) 0.12 % solution Use as directed 15 mLs in the mouth or throat 2 (two) times daily. Gargle with 15 mL after meals and at bedtime 08/13/22   Maryruth Sol, FNP  cinacalcet  (SENSIPAR ) 30 MG tablet Take 30 mg by mouth every evening.    [provider]  cinacalcet  (SENSIPAR ) 30 MG tablet Take 30 mg by mouth every evening. 07/16/23   [provider]  citalopram  (CELEXA ) 20 MG tablet Take 1 tablet (20 mg total) by mouth every morning. 10/04/22   Paseda, Folashade R, FNP  citalopram  (CELEXA ) 20 MG tablet Take 20 mg by mouth daily.    [provider]  citalopram  (CELEXA ) 20 MG tablet Take 20 mg by mouth every morning. 07/25/23   [provider]  cyclobenzaprine  (FLEXERIL ) 5 MG tablet Take 1 tablet (5 mg total) by mouth 3 (three) times daily as needed. 10/23/23   Paseda, Folashade R, FNP  diclofenac (VOLTAREN) 75 MG EC tablet Take 75 mg by mouth 2 (two) times daily as needed (pain).    [provider]  fluticasone  (FLONASE ) 50 MCG/ACT nasal spray Place 2 sprays into both nostrils daily. 10/23/23   Paseda, Folashade R, FNP  HYDROcodone -acetaminophen  (NORCO/VICODIN) 5-325  MG tablet Take 1 tablet by mouth every 12 (twelve) hours as needed for severe pain (pain score 7-10) (breakthrough). 09/02/23   Singh, Prashant K, MD  ibuprofen  (ADVIL ) 600 MG tablet Take 1 tablet (600 mg total) by mouth every 8 (eight) hours as needed (severe pain 7-10). 10/23/23   Paseda, Folashade R, FNP  INGREZZA  80 MG capsule Take one capsule by mouth daily 07/03/23   Tat, Von Grumbling, DO  paliperidone Palmitate ER (INVEGA TRINZA) 819 MG/2.63ML injection Inject 819 mg into the muscle every 3 (three) months.    [provider]  sevelamer carbonate  (RENVELA) 800 MG tablet Take 2,400 mg by mouth 3 (three) times daily with meals.    [provider]  valbenazine  (INGREZZA ) 40 MG capsule Take 40 mg by mouth daily.    [provider]  valbenazine  (INGREZZA ) 40 MG capsule Samples of this drug were given to the patient, quantity 1, Lot Number [id number] 11/27/22   Tat, Von Grumbling, DO  valbenazine  (INGREZZA ) 80 MG capsule Samples of this drug were given to the patient, quantity 1, Lot Number [id number] 11/27/22   Tat, Von Grumbling, DO  valbenazine  (INGREZZA ) 80 MG capsule Take 1 capsule (80 mg total) by mouth daily. 11/27/22   Tat, Von Grumbling, DO  valbenazine  (INGREZZA ) 80 MG capsule Take 80 mg by mouth daily.    [provider]  valbenazine  (INGREZZA ) 80 MG capsule Take 80 mg by mouth at bedtime.    [provider]      Allergies    Aspirin and Ibuprofen     Review of Systems   Review of Systems  Unable to perform ROS: Patient unresponsive  Neurological:  Positive for seizures.    Physical Exam Updated Vital Signs BP (!) 160/92   Pulse (!) 59   Temp (!) 97.2 F (36.2 C)   Resp 14   Ht 1.727 m (5\' 8" )   Wt 82.2 kg   SpO2 100%   BMI 27.55 kg/m  Physical Exam Vitals and nursing note reviewed.  Constitutional:      Appearance: Normal appearance. He is well-developed.  HENT:     Head: Atraumatic.     Nose: Nose normal.     Mouth/Throat:     Mouth: Mucous membranes are moist.     Pharynx: Oropharynx is clear.  Eyes:     General: No scleral icterus.    Conjunctiva/sclera: Conjunctivae normal.     Pupils: Pupils are equal, round, and reactive to light.  Neck:     Vascular: No carotid bruit.     Trachea: No tracheal deviation.     Comments: Trachea midline, thyroid not grossly enlarged or tender. No neck stiffness or rigidity.  Cardiovascular:     Rate and Rhythm: Normal rate and regular rhythm.     Pulses: Normal pulses.     Heart sounds: Normal heart sounds. No murmur heard.    No friction rub. No  gallop.  Pulmonary:     Effort: Pulmonary effort is normal. No accessory muscle usage or respiratory distress.     Breath sounds: Normal breath sounds.  Abdominal:     General: Bowel sounds are normal. There is no distension.     Palpations: Abdomen is soft.     Tenderness: There is no abdominal tenderness. There is no guarding.  Genitourinary:    Comments: No cva tenderness. Musculoskeletal:        General: No swelling.     Cervical back: Normal range of motion  and neck supple. No rigidity or tenderness.     Comments: CTLS spine, non tender, aligned, no step off. Good rom bil extremities without pain or focal bony tenderness.   Skin:    General: Skin is warm and dry.     Findings: No rash.  Neurological:     Mental Status: He is alert.     Comments: Opens eyes spontaneously and to command. Moves bil extremities purposefully with good strength. Does not respond to questions verbally.     ED Results / Procedures / Treatments   Labs (all labs ordered are listed, but only abnormal results are displayed) Results for orders placed or performed during the hospital encounter of 11/12/23  CBC   Collection Time: 11/12/23 11:52 AM  Result Value Ref Range   WBC 6.2 4.0 - 10.5 K/uL   RBC 3.94 (L) 4.22 - 5.81 MIL/uL   Hemoglobin 12.1 (L) 13.0 - 17.0 g/dL   HCT 04.5 (L) 40.9 - 81.1 %   MCV 98.7 80.0 - 100.0 fL   MCH 30.7 26.0 - 34.0 pg   MCHC 31.1 30.0 - 36.0 g/dL   RDW 91.4 78.2 - 95.6 %   Platelets 236 150 - 400 K/uL   nRBC 0.0 0.0 - 0.2 %  Comprehensive metabolic panel with GFR   Collection Time: 11/12/23 11:52 AM  Result Value Ref Range   Sodium 142 135 - 145 mmol/L   Potassium 4.3 3.5 - 5.1 mmol/L   Chloride 104 98 - 111 mmol/L   CO2 16 (L) 22 - 32 mmol/L   Glucose, Bld 103 (H) 70 - 99 mg/dL   BUN 44 (H) 6 - 20 mg/dL   Creatinine, Ser 21.30 (H) 0.61 - 1.24 mg/dL   Calcium 9.7 8.9 - 86.5 mg/dL   Total Protein 6.6 6.5 - 8.1 g/dL   Albumin 3.6 3.5 - 5.0 g/dL   AST 21 15 - 41  U/L   ALT 14 0 - 44 U/L   Alkaline Phosphatase 66 38 - 126 U/L   Total Bilirubin 0.6 0.0 - 1.2 mg/dL   GFR, Estimated 4 (L) >60 mL/min   Anion gap 22 (H) 5 - 15  Ethanol   Collection Time: 11/12/23 11:52 AM  Result Value Ref Range   Alcohol, Ethyl (B) <15 <15 mg/dL  CBG monitoring, ED   Collection Time: 11/12/23 12:03 PM  Result Value Ref Range   Glucose-Capillary 94 70 - 99 mg/dL     EKG EKG Interpretation Date/Time:  Monday November 12 2023 11:36:02 EDT Ventricular Rate:  89 PR Interval:  133 QRS Duration:  99 QT Interval:  410 QTC Calculation: 499 R Axis:   89  Text Interpretation: Sinus rhythm Confirmed by Guadalupe Lee ([id number]) on 11/12/2023 1:34:45 PM  Radiology CT Head Wo Contrast Result Date: 11/12/2023 CLINICAL DATA:  Mental status change, unknown cause EXAM: CT HEAD WITHOUT CONTRAST TECHNIQUE: Contiguous axial images were obtained from the base of the skull through the vertex without intravenous contrast. RADIATION DOSE REDUCTION: This exam was performed according to the departmental dose-optimization program which includes automated exposure control, adjustment of the mA and/or kV according to patient size and/or use of iterative reconstruction technique. COMPARISON:  CT head 02/09/2018 FINDINGS: Brain: No evidence of large-territorial acute infarction. No parenchymal hemorrhage. No mass lesion. No extra-axial collection. No mass effect or midline shift. No hydrocephalus. Basilar cisterns are patent. Vascular: No hyperdense vessel. Skull: No acute fracture or focal lesion. Sinuses/Orbits: Paranasal sinuses and mastoid air cells are  clear. The orbits are unremarkable. Other: None. IMPRESSION: No acute intracranial abnormality. Electronically Signed   By: Morgane  Naveau M.D.   On: 11/12/2023 15:47    Procedures Procedures    Medications Ordered in ED Medications - No data to display  ED Course/ Medical Decision Making/ A&P                                 Medical  Decision Making Problems Addressed: Acute alteration in mental status: acute illness or injury with systemic symptoms that poses a threat to life or bodily functions Elevated blood pressure reading: acute illness or injury ESRD on dialysis [facility]): chronic illness or injury that poses a threat to life or bodily functions Essential hypertension: chronic illness or injury that poses a threat to life or bodily functions History of seizure: acute illness or injury with systemic symptoms that poses a threat to life or bodily functions  Amount and/or Complexity of Data Reviewed Independent Historian: EMS    Details: Ems/family, hx External Data Reviewed: notes. Labs: ordered. Decision-making details documented in ED Course. Radiology: ordered and independent interpretation performed. Decision-making details documented in ED Course. ECG/medicine tests: ordered and independent interpretation performed. Decision-making details documented in ED Course.  Risk Decision regarding hospitalization.   Iv ns. Continuous pulse ox and cardiac monitoring. Labs ordered/sent. Imaging ordered.   Differential diagnosis includes seizure, head injury, hypoglycemia, etc. Dispo decision including potential need for admission considered - will get labs and imaging and reassess.   Reviewed nursing notes and prior charts for additional history. External reports reviewed. Additional history from: EMS.   Cardiac monitor: sinus rhythm, rate 88.  Labs reviewed/interpreted by me - wbc and hct normal. Ckd/esrd. K is normal.   CT reviewed/interpreted by me - no hem.   Recheck, moves about stretcher purposefully. Pt with decreased verbal responsiveness. Family/mom not able to provide additional insight into current symptoms. Indicates intermittent movement symptoms is not new, has seen neurology, indicates is supposed to be taking meds for same but unsure if taking.  No report of specific physical complaint/illness or acute  behavioral health illness or stressor. No report of fevers. No report of trauma.  On repeat exam, no focal pain/tenderness noted. Chest cta bil. Rrr. Abd soft nt. No neck stiffness/rigidity.   Possible seizure pta w post-ictal period. On additional recheck pt is verbally responsive, answers briefly to questions.   2350, labs pending - signed out to Dr Daivd Dub plan of po fluids/food, ambulate, ?metabolize any substance on board, reassess and dispo appropriately.          Final Clinical Impression(s) / ED Diagnoses Final diagnoses:  ESRD on dialysis (HCC)  Acute alteration in mental status  Elevated blood pressure reading  Essential hypertension  History of seizure    Rx / DC Orders ED Discharge Orders     None         Guadalupe Lee, MD 11/12/23 1614

## 2023-11-12 NOTE — Discharge Instructions (Addendum)
 It was our pleasure to provide your ER care today - we hope that you feel better.  Drink plenty of fluids/stay well hydrated.   Follow up closely with primary care doctor in the coming week. Follow up with your kidney doctors at your next dialysis. For possible seizure, follow up closely with neurologist.  Make sure to avoid any cocaine or drug use, as it is harmful to your physical health and mental well-being.   No driving, swimming, operating heavy machinery, or other similar potentially dangerous activity until cleared to do so by your doctor or neurologist.   Return to ER if worse, new symptoms, fevers, chest pain, trouble breathing, seizures or other concern.

## 2023-11-12 NOTE — ED Notes (Signed)
 Received report from previous RN. Pt resting, awakens to loud voice. Family at bedside. Airway intact.

## 2023-11-12 NOTE — ED Notes (Signed)
 Pt transported to CT ?

## 2023-11-13 ENCOUNTER — Telehealth: Payer: Self-pay | Admitting: Nurse Practitioner

## 2023-11-13 ENCOUNTER — Telehealth: Payer: Self-pay

## 2023-11-13 NOTE — Telephone Encounter (Signed)
 Copied from CRM [id number]. Topic: General - Other >> Nov 13, 2023 12:09 PM Kevelyn M wrote: Reason for CRM: Patient's mother calling back [employer]. Please call back #[phone]. Patient is at dialysis right now from 11:45 am -4pm.

## 2023-11-13 NOTE — Transitions of Care (Post Inpatient/ED Visit) (Signed)
   11/13/2023  Name: William Gregory MRN: [id number] DOB: 1985/10/30  Today's TOC FU Call Status: Today's TOC FU Call Status:: Unsuccessful Call (1st Attempt) Unsuccessful Call (1st Attempt) Date: 11/13/23  Attempted to reach the patient regarding the most recent Inpatient/ED visit.  Follow Up Plan: Additional outreach attempts will be made to reach the patient to complete the Transitions of Care (Post Inpatient/ED visit) call.   Signature [employer], [place]

## 2023-11-13 NOTE — Transitions of Care (Post Inpatient/ED Visit) (Signed)
 11/13/2023  Name: William Gregory MRN: [id number] DOB: [birth year]  Today's TOC FU Call Status:   Patient's Name and Date of Birth confirmed.  Transition Care Management Follow-up Telephone Call Date of Discharge: 11/12/23 Discharge Facility: [facility]) Type of Discharge: Emergency Department Reason for ED Visit: Other: How have you been since you were released from the hospital?: Better Any questions or concerns?: No  Items Reviewed: Did you receive and understand the discharge instructions provided?: No Medications obtained,verified, and reconciled?: Yes (Medications Reviewed) Any new allergies since your discharge?: No Dietary orders reviewed?: No Do you have support at home?: Yes People in Home [RPT]: parent(s) Name of Support/Comfort Primary Source: Odie Benne -his mother  Medications Reviewed Today: Medications Reviewed Today     Reviewed by Angelita Bares, CMA (Certified Medical Assistant) on 11/13/23 at 1527  Med List Status: <None>   Medication Order Taking? Sig Documenting Provider Last Dose Status Informant  acetaminophen  (TYLENOL ) 500 MG tablet [id number] Yes Take 500 mg by mouth every 6 (six) hours as needed for moderate pain. [provider] Taking Active Self, Spouse/Significant Other           Med Note Ace Abu, [facility]   Fri May 12, 2022  2:02 PM) prn  acetaminophen  (TYLENOL ) 500 MG tablet [id number]  Take 1 tablet (500 mg total) by mouth every 8 (eight) hours as needed for mild pain (pain score 1-3) or moderate pain (pain score 4-6) (or Fever >/= 101). Singh, Prashant K, MD  Active   albuterol  (VENTOLIN  HFA) 108 (90 Base) MCG/ACT inhaler [id number] Yes Inhale 2 puffs into the lungs every 4 (four) hours as needed for wheezing or shortness of breath. Paseda, Folashade R, FNP Taking Active   amLODipine  (NORVASC ) 5 MG tablet [id number] Yes TAKE 1 TABLET(5 MG) BY MOUTH DAILY Paseda, Folashade R, FNP Taking Active   amLODipine  (NORVASC ) 5 MG tablet [id number]   TAKE ONE TABLET BY MOUTH EVERY DAY Nichols, Tonya S, NP  Active   amLODipine  (NORVASC ) 5 MG tablet [id number]  Take 5 mg by mouth daily. [provider]  Active Spouse/Significant Other, Pharmacy Records           Med Note (COFFELL, Stan Eans   Thu Aug 02, 2023  4:14 PM) Per Rx bottle at bedside, LF 06/15/23 #90, 90 DS.  amLODipine  (NORVASC ) 5 MG tablet [id number]  Take 5 mg by mouth daily. [provider]  Active Mother, Pharmacy Records, Other           Med Note Silvestre Drum, Myrtle Atta   Fri Aug 31, 2023  2:03 PM) LF: 06/15/23 for a 90ds  benztropine  (COGENTIN ) 1 MG tablet [id number] Yes Take 1 mg by mouth 2 (two) times daily. [provider] Taking Active Spouse/Significant Other, Pharmacy Records           Med Note (COFFELL, Stan Eans   Thu Aug 02, 2023  4:15 PM) Per Rx bottle at bedside, LF 07/25/23 #60, 30 DS.  benztropine  (COGENTIN ) 1 MG tablet [id number]  Take 1 mg by mouth 2 (two) times daily. [provider]  Active Pharmacy Records, Other, Mother  chlorhexidine  (PERIDEX ) 0.12 % solution [id number] Yes Use as directed 15 mLs in the mouth or throat 2 (two) times daily. Gargle with 15 mL after meals and at bedtime Maryruth Sol, FNP Taking Active   cinacalcet  (SENSIPAR ) 30 MG tablet [id number] Yes Take 30 mg by mouth every evening. [provider] Taking Active Spouse/Significant Other, Pharmacy Records  Med Note (COFFELL, ANGELA M   Thu Aug 02, 2023  4:21 PM) LF 07/16/23 #30, 30 DS.  cinacalcet  (SENSIPAR ) 30 MG tablet [id number]  Take 30 mg by mouth every evening. [provider]  Active Mother, Pharmacy Records, Other           Med Note Silvestre Drum, Myrtle Atta   Fri Aug 31, 2023  2:03 PM) LF: 07/16/23 for a 30ds  citalopram  (CELEXA ) 20 MG tablet [id number] Yes Take 1 tablet (20 mg total) by mouth every morning. Paseda, Folashade R, FNP Taking Active   citalopram  (CELEXA ) 20 MG tablet [id number]  Take 20 mg by mouth daily. [provider]  Active Spouse/Significant Other, Pharmacy Records           Med Note (COFFELL, Arlette Benders Aug 02, 2023  4:15 PM) Per Rx bottle at beside, LF 07/25/23 #30, 30 DS.  citalopram  (CELEXA ) 20 MG tablet [id number]  Take 20 mg by mouth every morning. [provider]  Active Mother, Pharmacy Records, Other           Med Note Silvestre Drum, Myrtle Atta   Fri Aug 31, 2023  2:04 PM) LF: 07/25/23 for a 30ds  cyclobenzaprine  (FLEXERIL ) 5 MG tablet [id number] Yes Take 1 tablet (5 mg total) by mouth 3 (three) times daily as needed. Paseda, Folashade R, FNP Taking Active   diclofenac (VOLTAREN) 75 MG EC tablet [id number]  Take 75 mg by mouth 2 (two) times daily as needed (pain). [provider]  Active Spouse/Significant Other, Pharmacy Records           Med Note (COFFELL, Arlette Benders Aug 02, 2023  4:17 PM) Per Rx bottle at bedside, LF 07/29/22 #14, 7 DS.  fluticasone  (FLONASE ) 50 MCG/ACT nasal spray [id number] Yes Place 2 sprays into both nostrils daily. Paseda, Folashade R, FNP Taking Active   HYDROcodone -acetaminophen  (NORCO/VICODIN) 5-325 MG tablet [id number] No Take 1 tablet by mouth every 12 (twelve) hours as needed for severe pain (pain score 7-10) (breakthrough).  Patient not taking: Reported on 11/13/2023   Cala Castleman, MD Not Taking Active   ibuprofen  (ADVIL ) 600 MG tablet [id number] Yes Take 1 tablet (600 mg total) by mouth every 8 (eight) hours as needed (severe pain 7-10). Paseda, Folashade R, FNP Taking Active   INGREZZA  80 MG capsule [id number] Yes Take one capsule by mouth daily Tat, Von Grumbling, DO Taking Active   paliperidone Palmitate ER (INVEGA TRINZA) 819 MG/2.63ML injection [id number]  Inject 819 mg into the muscle every 3 (three) months. [provider]  Active Pharmacy Records, Other, Mother           Med Note Silvestre Drum, Myrtle Atta   Fri Aug 31, 2023  4:45 PM) Patients mother stated he is due for his next injection in April 22nd 2025 (per [facility])   sevelamer carbonate (RENVELA) 800 MG tablet [id number]  Take 2,400 mg by mouth 3 (three) times daily with meals. [provider]  Active Spouse/Significant Other, Pharmacy Records           Med Note (COFFELL, Stan Eans   Thu Aug 02, 2023  4:18 PM) Per Rx bottle at bedside, LF 08/03/22 #810, 90 DS. Pt does not take - too difficult to swallow.  valbenazine  (INGREZZA ) 40 MG capsule [id number]  Take 40 mg by mouth daily. [provider]  Active Self, Spouse/Significant Other           Med Note (HORTON, Hannah Lewis  Mon Jul 17, 2022  3:55 PM) Get from [facility]  valbenazine  (INGREZZA ) 40 MG capsule [id number]  Samples of this drug were given to the patient, quantity 1, Lot Number [id number] Tat, Von Grumbling, DO  Active   valbenazine  (INGREZZA ) 80 MG capsule [id number]  Samples of this drug were given to the patient, quantity 1, Lot Number [id number] Tat, Von Grumbling, DO  Active   valbenazine  (INGREZZA ) 80 MG capsule [id number]  Take 1 capsule (80 mg total) by mouth daily. Shirline Dover, DO  Active   valbenazine  (INGREZZA ) 80 MG capsule [id number]  Take 80 mg by mouth daily. [provider]  Active Spouse/Significant Other, Pharmacy Records           Med Note (COFFELL, Arlette Benders Aug 02, 2023  4:16 PM) Seymour Dapper from [facility] specialty pharmacy. LF 07/03/23 #30, 30 DS.  valbenazine  (INGREZZA ) 80 MG capsule [id number]  Take 80 mg by mouth at bedtime. [provider]  Active Pharmacy Records, Other, Mother  Med List Note Lige Reeve 08/31/23 1702): Patient gets Benztropine , Ingrezza , and Trinza injection form [facility] [phone])  Dialysis: Tuesday, Thursday, and Saturday            Home Care and Equipment/Supplies: Were Home Health Services Ordered?: No Any new equipment or medical supplies ordered?: No  Functional Questionnaire: Do you need assistance with bathing/showering or dressing?: No Do you need assistance with meal preparation?:  No Do you need assistance with eating?: No Do you have difficulty maintaining continence: No Do you need assistance with getting out of bed/getting out of a chair/moving?: No Do you have difficulty managing or taking your medications?: No  Follow up appointments reviewed: PCP Follow-up appointment confirmed?: Yes Date of PCP follow-up appointment?: 11/23/23 Follow-up Provider: [facility] Follow-up appointment confirmed?: NA Do you need transportation to your follow-up appointment?: No Do you understand care options if your condition(s) worsen?: Yes-patient verbalized understanding    SIGNATURE Harvy Riera, RMA

## 2023-11-16 ENCOUNTER — Other Ambulatory Visit: Payer: Self-pay

## 2023-11-16 ENCOUNTER — Encounter: Payer: Self-pay | Admitting: Nurse Practitioner

## 2023-11-16 ENCOUNTER — Ambulatory Visit (INDEPENDENT_AMBULATORY_CARE_PROVIDER_SITE_OTHER): Payer: MEDICAID | Admitting: Nurse Practitioner

## 2023-11-16 ENCOUNTER — Other Ambulatory Visit (HOSPITAL_COMMUNITY): Payer: Self-pay

## 2023-11-16 VITALS — BP 139/79 | HR 62 | Temp 97.3°F | Wt 181.0 lb

## 2023-11-16 DIAGNOSIS — I10 Essential (primary) hypertension: Secondary | ICD-10-CM | POA: Diagnosis not present

## 2023-11-16 DIAGNOSIS — N186 End stage renal disease: Secondary | ICD-10-CM

## 2023-11-16 DIAGNOSIS — F1490 Cocaine use, unspecified, uncomplicated: Secondary | ICD-10-CM | POA: Insufficient documentation

## 2023-11-16 DIAGNOSIS — G2401 Drug induced subacute dyskinesia: Secondary | ICD-10-CM | POA: Diagnosis not present

## 2023-11-16 DIAGNOSIS — F32A Depression, unspecified: Secondary | ICD-10-CM

## 2023-11-16 DIAGNOSIS — F209 Schizophrenia, unspecified: Secondary | ICD-10-CM

## 2023-11-16 DIAGNOSIS — Z09 Encounter for follow-up examination after completed treatment for conditions other than malignant neoplasm: Secondary | ICD-10-CM | POA: Insufficient documentation

## 2023-11-16 MED ORDER — AMLODIPINE BESYLATE 5 MG PO TABS
5.0000 mg | ORAL_TABLET | Freq: Every day | ORAL | 3 refills | Status: DC
  Filled 2023-11-16 – 2023-11-21 (×2): qty 90, 90d supply, fill #0

## 2023-11-16 MED ORDER — CINACALCET HCL 30 MG PO TABS
30.0000 mg | ORAL_TABLET | Freq: Every evening | ORAL | 1 refills | Status: AC
  Filled 2023-11-16 – 2023-11-21 (×2): qty 90, 90d supply, fill #0

## 2023-11-16 MED ORDER — CITALOPRAM HYDROBROMIDE 20 MG PO TABS
20.0000 mg | ORAL_TABLET | Freq: Every morning | ORAL | 1 refills | Status: AC
  Filled 2023-11-16 – 2023-11-21 (×2): qty 90, 90d supply, fill #0
  Filled ????-??-??: fill #0

## 2023-11-16 NOTE — Assessment & Plan Note (Signed)
 Goes to [facility] citalopram  20 mg daily Maintain close follow-up with psychiatrist

## 2023-11-16 NOTE — Assessment & Plan Note (Signed)
 Hospital chart reviewed, including discharge summary Medications reconciled and reviewed with the patient in detail

## 2023-11-16 NOTE — Assessment & Plan Note (Signed)
 Need to avoid use of cocaine and other illicit drugs discussed

## 2023-11-16 NOTE — Assessment & Plan Note (Addendum)
 Followed by neurology Continue Cogentin  80 mg daily, Cogentin  1 mg daily Maintain close follow-up with neurology and psychiatrist

## 2023-11-16 NOTE — Assessment & Plan Note (Addendum)
 Continue chemo dialysis on Tuesday Thursdays and Saturdays Continue Sensipar  30 mg daily

## 2023-11-16 NOTE — Assessment & Plan Note (Signed)
 Followed by psychiatrist at [facility] takes Paliperidone Palmitate ER shots every 3 months

## 2023-11-16 NOTE — Assessment & Plan Note (Signed)
 BP Readings from Last 3 Encounters:  11/16/23 139/79  11/12/23 (!) 150/88  10/02/23 132/88  Controlled on amlodipine  5 mg daily Continue current medications. No changes in management. Encouraged DASH diet and dietary sodium restrictions

## 2023-11-16 NOTE — Patient Instructions (Signed)

## 2023-11-16 NOTE — Progress Notes (Signed)
 Established Patient Office Visit  Subjective:  Patient ID: William Gregory, male    DOB: 05-05-1986  Age: 38 y.o. MRN: [id number]  CC:  Chief Complaint  Patient presents with   Hospitalization Follow-up    HPI William Gregory is a 38 y.o. male  has a past medical history of Alcohol abuse, Arthritis, Asthma, Back pain, Chronic kidney disease (CKD), Depression, GERD (gastroesophageal reflux disease), Hypertension, Knee joint pain, left, Neck pain, Pneumonia, PONV (postoperative nausea and vomiting), Psychiatric problem, and Tobacco abuse.  Patient presents for hospitalization follow-up.  He is accompanied by his spouse who stated that the patient has been attending dialysis 3 times weekly on Tuesdays Thursdays and Saturdays   He was at the emergency department on 11/12/2023 via EMS where patient was noted with decreased activity level, decreased responsiveness and occasional twitching.  White blood cell and hematocrit and potassium were normal, has chronic CKD on dialysis, CT head showed no acute abnormality patient was discharged back home when stabilized and back to his baseline function.  Urine test was positive for cocaine but the patient denies using cocaine.  Patient was sleeping for the most part of this visit, he would wake up to answer questions and go back to sleep, he was  easily arousable.  He left the office in a stable condition alert and oriented accompanied by his wife. Unable to completed a full ROS, no sign of distress noted      Past Medical History:  Diagnosis Date   Alcohol abuse    Arthritis    Asthma    Back pain    Chronic kidney disease (CKD)    Stage 5 Dialysis Tu/Th/Sa   Depression    GERD (gastroesophageal reflux disease)    Hypertension    Knee joint pain, left    Neck pain    Pneumonia    PONV (postoperative nausea and vomiting)    Psychiatric problem    unspecified by patient (receives injections bi-weekly)   Tobacco abuse     Past  Surgical History:  Procedure Laterality Date   arm surgery     AV FISTULA PLACEMENT Left 07/19/2022   Procedure: LEFT ARM ARTERIOVENOUS (AV) FISTULA CREATION;  Surgeon: Margherita Shell, MD;  Location: [facility] OR;  Service: Vascular;  Laterality: Left;   IR FLUORO GUIDE CV LINE RIGHT  03/31/2022   IR PERC TUN PERIT CATH WO PORT S&I /IMAG  03/31/2022   IR US  GUIDE VASC ACCESS RIGHT  03/31/2022    Family History  Problem Relation Age of Onset   Diabetes Mother    Diabetes Father    CVA Father    Dementia Maternal Grandmother    Kidney disease Maternal Grandmother    Heart attack Neg Hx     Social History   Socioeconomic History   Marital status: Significant Other    Spouse name: Not on file   Number of children: Not on file   Years of education: Not on file   Highest education level: Not on file  Occupational History   Not on file  Tobacco Use   Smoking status: Some Days    Current packs/day: 0.50    Types: Cigarettes    Passive exposure: Never   Smokeless tobacco: Never  Vaping Use   Vaping status: Never Used  Substance and Sexual Activity   Alcohol use: Not Currently    Comment: sober x months   Drug use: Yes    Types: Marijuana  Comment: cocaine in the past   Sexual activity: Yes  Other Topics Concern   Not on file  Social History Narrative   ** Merged History Encounter **       Right handed Unemployed/disability Ninth grade level of education   Social Drivers of Health   Financial Resource Strain: Not on file  Food Insecurity: Patient Unable To Answer (09/01/2023)   Hunger Vital Sign    Worried About Running Out of Food in the Last Year: Patient unable to answer    Ran Out of Food in the Last Year: Patient unable to answer  Transportation Needs: Patient Unable To Answer (09/01/2023)   PRAPARE - Transportation    Lack of Transportation (Medical): Patient unable to answer    Lack of Transportation (Non-Medical): Patient unable to answer  Physical Activity:  Not on file  Stress: Not on file  Social Connections: Not on file  Intimate Partner Violence: Patient Unable To Answer (09/01/2023)   Humiliation, Afraid, Rape, and Kick questionnaire    Fear of Current or Ex-Partner: Patient unable to answer    Emotionally Abused: Patient unable to answer    Physically Abused: Patient unable to answer    Sexually Abused: Patient unable to answer    Outpatient Medications Prior to Visit  Medication Sig Dispense Refill   acetaminophen  (TYLENOL ) 500 MG tablet Take 500 mg by mouth every 6 (six) hours as needed for moderate pain.     albuterol  (VENTOLIN  HFA) 108 (90 Base) MCG/ACT inhaler Inhale 2 puffs into the lungs every 4 (four) hours as needed for wheezing or shortness of breath. 18 g 1   benztropine  (COGENTIN ) 1 MG tablet Take 1 mg by mouth 2 (two) times daily.     cyclobenzaprine  (FLEXERIL ) 5 MG tablet Take 1 tablet (5 mg total) by mouth 3 (three) times daily as needed. 20 tablet 0   fluticasone  (FLONASE ) 50 MCG/ACT nasal spray Place 2 sprays into both nostrils daily. 16 g 0   ibuprofen  (ADVIL ) 600 MG tablet Take 1 tablet (600 mg total) by mouth every 8 (eight) hours as needed (severe pain 7-10). 20 tablet 0   paliperidone Palmitate ER (INVEGA TRINZA) 819 MG/2.63ML injection Inject 819 mg into the muscle every 3 (three) months.     valbenazine  (INGREZZA ) 80 MG capsule Samples of this drug were given to the patient, quantity 1, Lot Number [id number] 30 capsule 0   amLODipine  (NORVASC ) 5 MG tablet TAKE 1 TABLET(5 MG) BY MOUTH DAILY 30 tablet 1   amLODipine  (NORVASC ) 5 MG tablet TAKE ONE TABLET BY MOUTH EVERY DAY 30 tablet 0   cinacalcet  (SENSIPAR ) 30 MG tablet Take 30 mg by mouth every evening.     cinacalcet  (SENSIPAR ) 30 MG tablet Take 30 mg by mouth every evening.     citalopram  (CELEXA ) 20 MG tablet Take 1 tablet (20 mg total) by mouth every morning. 90 tablet 1   citalopram  (CELEXA ) 20 MG tablet Take 20 mg by mouth every morning.     INGREZZA  80 MG  capsule Take one capsule by mouth daily 30 capsule 5   sevelamer carbonate (RENVELA) 800 MG tablet Take 2,400 mg by mouth 3 (three) times daily with meals.     acetaminophen  (TYLENOL ) 500 MG tablet Take 1 tablet (500 mg total) by mouth every 8 (eight) hours as needed for mild pain (pain score 1-3) or moderate pain (pain score 4-6) (or Fever >/= 101). 20 tablet 0   amLODipine  (NORVASC ) 5 MG tablet Take 5  mg by mouth daily.     amLODipine  (NORVASC ) 5 MG tablet Take 5 mg by mouth daily.     benztropine  (COGENTIN ) 1 MG tablet Take 1 mg by mouth 2 (two) times daily.     chlorhexidine  (PERIDEX ) 0.12 % solution Use as directed 15 mLs in the mouth or throat 2 (two) times daily. Gargle with 15 mL after meals and at bedtime (Patient not taking: Reported on 11/16/2023) 118 mL 0   citalopram  (CELEXA ) 20 MG tablet Take 20 mg by mouth daily.     diclofenac (VOLTAREN) 75 MG EC tablet Take 75 mg by mouth 2 (two) times daily as needed (pain).     HYDROcodone -acetaminophen  (NORCO/VICODIN) 5-325 MG tablet Take 1 tablet by mouth every 12 (twelve) hours as needed for severe pain (pain score 7-10) (breakthrough). (Patient not taking: Reported on 11/16/2023) 6 tablet 0   valbenazine  (INGREZZA ) 40 MG capsule Take 40 mg by mouth daily.     valbenazine  (INGREZZA ) 40 MG capsule Samples of this drug were given to the patient, quantity 1, Lot Number [id number] (Patient not taking: Reported on 11/16/2023) 30 capsule 0   valbenazine  (INGREZZA ) 80 MG capsule Take 1 capsule (80 mg total) by mouth daily. 180 capsule 0   valbenazine  (INGREZZA ) 80 MG capsule Take 80 mg by mouth daily.     valbenazine  (INGREZZA ) 80 MG capsule Take 80 mg by mouth at bedtime.     No facility-administered medications prior to visit.    Allergies  Allergen Reactions   Aspirin Swelling   Ibuprofen  Rash    ROS Review of Systems    Objective:    Physical Exam Vitals and nursing note reviewed.  Constitutional:      Appearance: Normal appearance.    Eyes:     General: No scleral icterus.       Right eye: No discharge.        Left eye: No discharge.     Extraocular Movements: Extraocular movements intact.     Conjunctiva/sclera: Conjunctivae normal.    Cardiovascular:     Rate and Rhythm: Normal rate and regular rhythm.     Pulses: Normal pulses.     Heart sounds: Normal heart sounds. No murmur heard.    No friction rub. No gallop.  Pulmonary:     Effort: Pulmonary effort is normal. No respiratory distress.     Breath sounds: Normal breath sounds. No stridor. No wheezing, rhonchi or rales.  Chest:     Chest wall: No tenderness.  Abdominal:     General: There is no distension.     Palpations: Abdomen is soft.     Tenderness: There is no abdominal tenderness. There is no right CVA tenderness, left CVA tenderness or guarding.   Musculoskeletal:        General: No signs of injury.     Right lower leg: No edema.     Left lower leg: No edema.   Skin:    General: Skin is warm and dry.     Capillary Refill: Capillary refill takes less than 2 seconds.     Coloration: Skin is not jaundiced or pale.     Findings: No bruising, erythema or lesion.   Neurological:     Mental Status: He is alert. Mental status is at baseline.   Psychiatric:        Mood and Affect: Mood normal.        Behavior: Behavior normal.        Thought Content:  Thought content normal.        Judgment: Judgment normal.     BP 139/79   Pulse 62   Temp (!) 97.3 F (36.3 C)   Wt 181 lb (82.1 kg)   SpO2 100%   BMI 27.52 kg/m  Wt Readings from Last 3 Encounters:  11/16/23 181 lb (82.1 kg)  11/12/23 181 lb 3.5 oz (82.2 kg)  10/02/23 181 lb 3.2 oz (82.2 kg)    Lab Results  Component Value Date   TSH 3.36 03/20/2022   Lab Results  Component Value Date   WBC 6.2 11/12/2023   HGB 12.1 (L) 11/12/2023   HCT 38.9 (L) 11/12/2023   MCV 98.7 11/12/2023   PLT 236 11/12/2023   Lab Results  Component Value Date   NA 142 11/12/2023   K 4.3  11/12/2023   CO2 16 (L) 11/12/2023   GLUCOSE 103 (H) 11/12/2023   BUN 44 (H) 11/12/2023   CREATININE 13.74 (H) 11/12/2023   BILITOT 0.6 11/12/2023   ALKPHOS 66 11/12/2023   AST 21 11/12/2023   ALT 14 11/12/2023   PROT 6.6 11/12/2023   ALBUMIN 3.6 11/12/2023   CALCIUM 9.7 11/12/2023   ANIONGAP 22 (H) 11/12/2023   EGFR 6 (L) 05/12/2022   GFR 6.36 (LL) 03/20/2022   Lab Results  Component Value Date   CHOL 144 04/02/2008   Lab Results  Component Value Date   HDL 49 04/02/2008   Lab Results  Component Value Date   LDLCALC 83 04/02/2008   Lab Results  Component Value Date   TRIG 62 04/02/2008   Lab Results  Component Value Date   CHOLHDL 2.9 Ratio 04/02/2008   Lab Results  Component Value Date   HGBA1C 5.6 03/29/2022      Assessment & Plan:   Problem List Items Addressed This Visit       Cardiovascular and Mediastinum   Essential hypertension   BP Readings from Last 3 Encounters:  11/16/23 139/79  11/12/23 (!) 150/88  10/02/23 132/88  Controlled on amlodipine  5 mg daily Continue current medications. No changes in management. Encouraged DASH diet and dietary sodium restrictions         Relevant Medications   amLODipine  (NORVASC ) 5 MG tablet     Nervous and Auditory   Tardive dyskinesia   Followed by neurology Continue Cogentin  80 mg daily, Cogentin  1 mg daily Maintain close follow-up with neurology and psychiatrist        Genitourinary   ESRD (end stage renal disease) (HCC) - Primary   Continue chemo dialysis on Tuesday Thursdays and Saturdays Continue Sensipar  30 mg daily      Relevant Medications   cinacalcet  (SENSIPAR ) 30 MG tablet     Other   Depression   Goes to [employer] Continue citalopram  20 mg daily Maintain close follow-up with psychiatrist      Relevant Medications   citalopram  (CELEXA ) 20 MG tablet   Schizophrenia, unspecified (HCC)   Followed by psychiatrist at [facility] takes Paliperidone Palmitate ER shots every 3 months        Cocaine use   Need to avoid use of cocaine and other illicit drugs discussed       Encounter for examination following treatment at hospital   Hospital chart reviewed, including discharge summary Medications reconciled and reviewed with the patient in detail        Meds ordered this encounter  Medications   amLODipine  (NORVASC ) 5 MG tablet    Sig: Take 1 tablet (  5 mg total) by mouth daily.    Dispense:  90 tablet    Refill:  3    Pt needs an appointment   cinacalcet  (SENSIPAR ) 30 MG tablet    Sig: Take 1 tablet (30 mg total) by mouth every evening.    Dispense:  90 tablet    Refill:  1   citalopram  (CELEXA ) 20 MG tablet    Sig: Take 1 tablet (20 mg total) by mouth every morning.    Dispense:  90 tablet    Refill:  1    Follow-up: Return in about 2 months (around 01/16/2024) for HTN.    Radha Coggins R Keely Drennan, FNP

## 2023-11-19 ENCOUNTER — Other Ambulatory Visit (HOSPITAL_COMMUNITY): Payer: Self-pay

## 2023-11-21 ENCOUNTER — Other Ambulatory Visit: Payer: Self-pay

## 2023-11-21 LAB — THC,MS,WB/SP RFX
Cannabidiol: NEGATIVE ng/mL
Cannabinoid Confirmation: POSITIVE
Carboxy-THC: 3.7 ng/mL
Hydroxy-THC: NEGATIVE ng/mL
Tetrahydrocannabinol(THC): 1.1 ng/mL

## 2023-11-23 ENCOUNTER — Inpatient Hospital Stay: Payer: Self-pay | Admitting: Nurse Practitioner

## 2023-11-23 ENCOUNTER — Other Ambulatory Visit: Payer: Self-pay | Admitting: Nurse Practitioner

## 2023-11-23 DIAGNOSIS — I10 Essential (primary) hypertension: Secondary | ICD-10-CM

## 2023-11-23 LAB — DRUG SCREEN 10 W/CONF, SERUM
Amphetamines, IA: NEGATIVE ng/mL
Barbiturates, IA: NEGATIVE ug/mL
Benzodiazepines, IA: NEGATIVE ng/mL
Cocaine & Metabolite, IA: POSITIVE ng/mL — AB
Methadone, IA: NEGATIVE ng/mL
Opiates, IA: NEGATIVE ng/mL
Oxycodones, IA: NEGATIVE ng/mL
Phencyclidine, IA: NEGATIVE ng/mL
Propoxyphene, IA: NEGATIVE ng/mL
THC(Marijuana) Metabolite, IA: POSITIVE ng/mL — AB

## 2023-11-23 LAB — COCAINE,MS,WB/SP RFX
Benzoylecgonine: >1500 ng/mL
Cocaine Confirmation: POSITIVE
Cocaine: NEGATIVE ng/mL

## 2023-12-01 ENCOUNTER — Encounter (HOSPITAL_COMMUNITY): Payer: Self-pay

## 2023-12-01 ENCOUNTER — Ambulatory Visit (HOSPITAL_COMMUNITY)
Admission: EM | Admit: 2023-12-01 | Discharge: 2023-12-01 | Disposition: A | Discharge status: Home / Self Care | Payer: MEDICAID | Attending: Family Medicine | Admitting: Family Medicine

## 2023-12-01 DIAGNOSIS — L739 Follicular disorder, unspecified: Secondary | ICD-10-CM

## 2023-12-01 DIAGNOSIS — I10 Essential (primary) hypertension: Secondary | ICD-10-CM

## 2023-12-01 MED ORDER — AMLODIPINE BESYLATE 5 MG PO TABS: 5.0000 mg | ORAL_TABLET | Freq: Every day | ORAL | 0 refills | Status: DC

## 2023-12-01 MED ORDER — DOXYCYCLINE HYCLATE 100 MG PO CAPS: 100.0000 mg | ORAL_CAPSULE | Freq: Two times a day (BID) | ORAL | 0 refills | Status: DC

## 2023-12-01 NOTE — ED Triage Notes (Addendum)
 Patient reports that he has a rash on his face x 3 days.  Significant other reports that the patient has not had his BP meds in * days and does not know where his medications are.  Patient has been using an itching cream

## 2023-12-01 NOTE — ED Provider Notes (Addendum)
 [facility]   [id number] 12/01/23 Arrival Time: 1008  ASSESSMENT & PLAN:  1. Acute folliculitis   2. Elevated blood pressure reading in office with diagnosis of hypertension    Has dialysis today. Will tx for facial folliculitis/acne. Begin: Meds ordered this encounter  Medications   doxycycline (VIBRAMYCIN) 100 MG capsule    Sig: Take 1 capsule (100 mg total) by mouth 2 (two) times daily.    Dispense:  20 capsule    Refill:  0   Requests amlodipine  sent in. Meds ordered this encounter  Medications   amLODipine  (NORVASC ) 5 MG tablet    Sig: Take 1 tablet (5 mg total) by mouth daily.    Dispense:  30 tablet    Refill:  0     Follow-up Information     Schedule an appointment as soon as possible for a visit  with Paseda, Folashade R, FNP.   Specialty: Nurse Practitioner Why: For follow up. Contact information: [address] [place], [place] [address] [phone]         [facility] [place].   Specialty: Urgent Care Why: As needed. Contact information: [address] [place]  [address] [phone]                Reviewed expectations re: course of current medical issues. Questions answered. Outlined signs and symptoms indicating need for more acute intervention. Understanding verbalized. After Visit Summary given.   SUBJECTIVE: History from: Patient. William Gregory is a 38 y.o. male with CKD on dialysis/HTN. Patient reports that he has a rash on his face x 3 days.  Significant other reports that the patient has not had his BP meds for a few days. Were delivered to his father's house and she is going to check to see if they're there after they leave here. Patient has been using an itching cream on face without much change. Denies: fever. Normal PO intake without n/v/d.  OBJECTIVE:  Vitals:   12/01/23 1055  BP: (!) 155/86  Pulse: 64  Resp: 16  Temp: 98 F (36.7 C)  TempSrc:  Oral  SpO2: 98%    General appearance: alert; no distress Eyes: PERRLA; EOMI; conjunctiva normal HENT: [place]; AT; without nasal congestion Neck: supple without LAD Skin: warm and dry; scattered papular erythematous indurations/comedones on face Neurologic: normal gait Psychological: alert and cooperative; normal mood and affect  Labs:  Labs Reviewed - No data to display  Imaging: No results found.  Allergies  Allergen Reactions   Aspirin Swelling   Ibuprofen  Rash    Past Medical History:  Diagnosis Date   Alcohol abuse    Arthritis    Asthma    Back pain    Chronic kidney disease (CKD)    Stage 5 Dialysis Tu/Th/Sa   Depression    GERD (gastroesophageal reflux disease)    Hypertension    Knee joint pain, left    Neck pain    Pneumonia    PONV (postoperative nausea and vomiting)    Psychiatric problem    unspecified by patient (receives injections bi-weekly)   Tobacco abuse    Social History   Socioeconomic History   Marital status: Significant Other    Spouse name: Not on file   Number of children: Not on file   Years of education: Not on file   Highest education level: Not on file  Occupational History   Not on file  Tobacco Use   Smoking status:  Some Days    Current packs/day: 0.50    Types: Cigarettes    Passive exposure: Never   Smokeless tobacco: Never  Vaping Use   Vaping status: Never Used  Substance and Sexual Activity   Alcohol use: Not Currently    Comment: sober x months   Drug use: Yes    Types: Marijuana    Comment: cocaine  in the past   Sexual activity: Yes  Other Topics Concern   Not on file  Social History Narrative   ** Merged History Encounter **       Right handed Unemployed/disability Ninth grade level of education   Social Drivers of Health   Financial Resource Strain: Not on file  Food Insecurity: Patient Unable To Answer (09/01/2023)   Hunger Vital Sign    Worried About Running Out of Food in the Last Year:  Patient unable to answer    Ran Out of Food in the Last Year: Patient unable to answer  Transportation Needs: Patient Unable To Answer (09/01/2023)   PRAPARE - Transportation    Lack of Transportation (Medical): Patient unable to answer    Lack of Transportation (Non-Medical): Patient unable to answer  Physical Activity: Not on file  Stress: Not on file  Social Connections: Not on file  Intimate Partner Violence: Patient Unable To Answer (09/01/2023)   Humiliation, Afraid, Rape, and Kick questionnaire    Fear of Current or Ex-Partner: Patient unable to answer    Emotionally Abused: Patient unable to answer    Physically Abused: Patient unable to answer    Sexually Abused: Patient unable to answer   Family History  Problem Relation Age of Onset   Diabetes Mother    Diabetes Father    CVA Father    Dementia Maternal Grandmother    Kidney disease Maternal Grandmother    Heart attack Neg Hx    Past Surgical History:  Procedure Laterality Date   arm surgery     AV FISTULA PLACEMENT Left 07/19/2022   Procedure: LEFT ARM ARTERIOVENOUS (AV) FISTULA CREATION;  Surgeon: Serene Gaile ORN, MD;  Location: [facility] OR;  Service: Vascular;  Laterality: Left;   IR FLUORO GUIDE CV LINE RIGHT  03/31/2022   IR PERC TUN PERIT CATH WO PORT S&I /IMAG  03/31/2022   IR US  GUIDE VASC ACCESS RIGHT  03/31/2022     Rolinda Rogue, MD 12/01/23 1148    Rolinda Rogue, MD 12/01/23 1149    Rolinda Rogue, MD 12/01/23 1153

## 2023-12-04 ENCOUNTER — Other Ambulatory Visit (HOSPITAL_COMMUNITY): Payer: Self-pay

## 2024-01-15 ENCOUNTER — Other Ambulatory Visit: Payer: Self-pay | Admitting: Nurse Practitioner

## 2024-01-15 DIAGNOSIS — I10 Essential (primary) hypertension: Secondary | ICD-10-CM

## 2024-01-15 MED ORDER — AMLODIPINE BESYLATE 5 MG PO TABS
5.0000 mg | ORAL_TABLET | Freq: Every day | ORAL | 0 refills | Status: DC
Start: 2024-01-15 — End: 2024-01-18

## 2024-01-15 NOTE — Telephone Encounter (Signed)
 Copied from CRM [id number]. Topic: Clinical - Medication Refill >> Jan 15, 2024  2:30 PM Ivette P wrote: Medication: amLODipine  (NORVASC ) 5 MG tablet  Has the patient contacted their pharmacy? Yes (Agent: If no, request that the patient contact the pharmacy for the refill. If patient does not wish to contact the pharmacy document the reason why and proceed with request.) (Agent: If yes, when and what did the pharmacy advise?)  This is the patient's preferred pharmacy:  [facility] - [place], [place] - [address] [place] [address] Phone: [phone] Fax: [phone]   Is this the correct pharmacy for this prescription? Yes If no, delete pharmacy and type the correct one.   Has the prescription been filled recently? No  Is the patient out of the medication? No  Has the patient been seen for an appointment in the last year OR does the patient have an upcoming appointment? Yes  Can we respond through MyChart? Yes  Agent: Please be advised that Rx refills may take up to 3 business days. We ask that you follow-up with your pharmacy.

## 2024-01-16 ENCOUNTER — Ambulatory Visit: Payer: Self-pay | Admitting: Nurse Practitioner

## 2024-01-18 ENCOUNTER — Telehealth: Payer: Self-pay

## 2024-01-18 DIAGNOSIS — I10 Essential (primary) hypertension: Secondary | ICD-10-CM

## 2024-01-18 MED ORDER — AMLODIPINE BESYLATE 5 MG PO TABS
5.0000 mg | ORAL_TABLET | Freq: Every day | ORAL | 0 refills | Status: DC
Start: 2024-01-18 — End: 2024-02-19

## 2024-01-18 NOTE — Telephone Encounter (Signed)
 refill

## 2024-01-25 ENCOUNTER — Ambulatory Visit: Payer: Self-pay | Admitting: Nurse Practitioner

## 2024-02-19 ENCOUNTER — Other Ambulatory Visit: Payer: Self-pay | Admitting: Nurse Practitioner

## 2024-02-19 DIAGNOSIS — I10 Essential (primary) hypertension: Secondary | ICD-10-CM

## 2024-02-19 MED ORDER — AMLODIPINE BESYLATE 5 MG PO TABS
5.0000 mg | ORAL_TABLET | Freq: Every day | ORAL | 0 refills | Status: DC
Start: 2024-02-19 — End: 2024-04-14

## 2024-02-19 NOTE — Telephone Encounter (Signed)
 Copied from CRM [id number]. Topic: Clinical - Medication Refill >> Feb 19, 2024  4:05 PM Selinda RAMAN wrote: Medication: amLODipine  (NORVASC ) 5 MG tablet  Has the patient contacted their pharmacy? Yes   This is the patient's preferred pharmacy:    [facility] - [place], [place] - [address] [place] [address] Phone: [phone] Fax: [phone]    Is this the correct pharmacy for this prescription? Yes If no, delete pharmacy and type the correct one.   Has the prescription been filled recently? No  Is the patient out of the medication? No but he only has a few pills left  Has the patient been seen for an appointment in the last year OR does the patient have an upcoming appointment? Yes  Can we respond through MyChart? No  Please assist patient further

## 2024-02-27 ENCOUNTER — Ambulatory Visit (INDEPENDENT_AMBULATORY_CARE_PROVIDER_SITE_OTHER): Payer: MEDICAID

## 2024-02-27 ENCOUNTER — Encounter (HOSPITAL_COMMUNITY): Payer: Self-pay | Admitting: Emergency Medicine

## 2024-02-27 ENCOUNTER — Ambulatory Visit (HOSPITAL_COMMUNITY)
Admission: EM | Admit: 2024-02-27 | Discharge: 2024-02-27 | Disposition: A | Discharge status: Home / Self Care | Payer: MEDICAID | Attending: Physician Assistant | Admitting: Physician Assistant

## 2024-02-27 DIAGNOSIS — S42032K Displaced fracture of lateral end of left clavicle, subsequent encounter for fracture with nonunion: Secondary | ICD-10-CM

## 2024-02-27 DIAGNOSIS — M25512 Pain in left shoulder: Secondary | ICD-10-CM

## 2024-02-27 NOTE — Discharge Instructions (Addendum)
 Recommend shoulder sling for comfort Call orthopedics for follow up Can take Tylenol  as needed for pain.

## 2024-02-27 NOTE — ED Provider Notes (Signed)
 [facility]    CSN: [id number] Arrival date & time: 02/27/24  1606      History   Chief Complaint Chief Complaint  Patient presents with   Shoulder Pain    HPI Aristotle E Pott is a 38 y.o. male.   Patient presents with left shoulder pain that started 2 days ago.  He denies new injury or trauma.  He had a clavicle fracture in March.  He has not been seen by Ortho since then.  He does have a sling at home that he was wearing at that time.  Denies radiation of pain.  Denies neck pain.  He is taking nothing for the symptoms.    Past Medical History:  Diagnosis Date   Alcohol abuse    Arthritis    Asthma    Back pain    Chronic kidney disease (CKD)    Stage 5 Dialysis Tu/Th/Sa   Depression    GERD (gastroesophageal reflux disease)    Hypertension    Knee joint pain, left    Neck pain    Pneumonia    PONV (postoperative nausea and vomiting)    Psychiatric problem    unspecified by patient (receives injections bi-weekly)   Tobacco abuse     Patient Active Problem List   Diagnosis Date Noted   Cocaine  use 11/16/2023   Encounter for examination following treatment at hospital 11/16/2023   Seizures (HCC) 09/01/2023   Altered mental status 08/31/2023   End stage renal disease on dialysis (HCC) 08/31/2023   Left shoulder pain 08/31/2023   Essential hypertension 08/31/2023   Altered mental status 08/02/2023   Vapes nicotine  containing substance 10/04/2022   Nasal congestion 07/10/2022   Acute cough 07/10/2022   ESRD (end stage renal disease) (HCC) 05/12/2022   Schizophrenia, unspecified (HCC) 04/04/2022   Renal failure 03/30/2022   Essential hypertension 03/30/2022   Anemia of chronic disease 03/29/2022   Screening for diabetes mellitus 03/29/2022   Marijuana use 03/29/2022   Cocaine  abuse in remission (HCC) 03/29/2022   Tobacco abuse counseling 03/29/2022   Health care maintenance 03/29/2022   Tardive dyskinesia 03/29/2022   Need for influenza  vaccination 03/29/2022   High blood pressure 03/29/2022   Hyperparathyroidism, secondary renal 03/29/2022   AKI (acute kidney injury) 08/14/2015   Nausea vomiting and diarrhea 08/14/2015   Abdominal pain 08/14/2015   GERD (gastroesophageal reflux disease) 08/14/2015   Acute kidney injury    ACUTE KIDNEY FAILURE UNSPECIFIED 07/09/2009   Tobacco abuse 06/07/2009   KNEE PAIN, RIGHT, CHRONIC 01/15/2009   Upper back pain on right side 01/15/2009   LEG EDEMA, BILATERAL 12/15/2008   Alcohol abuse 04/02/2008   Depression 04/02/2008   MYOCARDIAL INFARCTION, HX OF 04/02/2008   Asthma 04/02/2008    Past Surgical History:  Procedure Laterality Date   arm surgery     AV FISTULA PLACEMENT Left 07/19/2022   Procedure: LEFT ARM ARTERIOVENOUS (AV) FISTULA CREATION;  Surgeon: Serene Gaile ORN, MD;  Location: [facility];  Service: Vascular;  Laterality: Left;   IR FLUORO GUIDE CV LINE RIGHT  03/31/2022   IR PERC TUN PERIT CATH WO PORT S&I /IMAG  03/31/2022   IR US  GUIDE VASC ACCESS RIGHT  03/31/2022       Home Medications    Prior to Admission medications   Medication Sig Start Date End Date Taking? Authorizing Provider  acetaminophen  (TYLENOL ) 500 MG tablet Take 500 mg by mouth every 6 (six) hours as needed for moderate pain.  [provider]  albuterol  (VENTOLIN  HFA) 108 (90 Base) MCG/ACT inhaler Inhale 2 puffs into the lungs every 4 (four) hours as needed for wheezing or shortness of breath. 10/23/23   Paseda, Folashade R, FNP  amLODipine  (NORVASC ) 5 MG tablet Take 1 tablet (5 mg total) by mouth daily. 02/19/24   Paseda, Folashade R, FNP  benztropine  (COGENTIN ) 1 MG tablet Take 1 mg by mouth 2 (two) times daily.    [provider]  cinacalcet  (SENSIPAR ) 30 MG tablet Take 1 tablet (30 mg total) by mouth every evening. 11/16/23   Paseda, Folashade R, FNP  citalopram  (CELEXA ) 20 MG tablet Take 1 tablet (20 mg total) by mouth every morning. 11/16/23   Paseda, Folashade R, FNP   cyclobenzaprine  (FLEXERIL ) 5 MG tablet Take 1 tablet (5 mg total) by mouth 3 (three) times daily as needed. 10/23/23   Paseda, Folashade R, FNP  doxycycline  (VIBRAMYCIN ) 100 MG capsule Take 1 capsule (100 mg total) by mouth 2 (two) times daily. 12/01/23   Rolinda Rogue, MD  fluticasone  (FLONASE ) 50 MCG/ACT nasal spray Place 2 sprays into both nostrils daily. 10/23/23   Paseda, Folashade R, FNP  ibuprofen  (ADVIL ) 600 MG tablet Take 1 tablet (600 mg total) by mouth every 8 (eight) hours as needed (severe pain 7-10). 10/23/23   Paseda, Folashade R, FNP  paliperidone Palmitate ER (INVEGA TRINZA) 819 MG/2.63ML injection Inject 819 mg into the muscle every 3 (three) months.    [provider]  sevelamer carbonate (RENVELA) 800 MG tablet Take 2,400 mg by mouth 3 (three) times daily with meals.    [provider]  valbenazine  (INGREZZA ) 80 MG capsule Samples of this drug were given to the patient, quantity 1, Lot Number [id number] 11/27/22   Tat, Asberry RAMAN, DO    Family History Family History  Problem Relation Age of Onset   Diabetes Mother    Diabetes Father    CVA Father    Dementia Maternal Grandmother    Kidney disease Maternal Grandmother    Heart attack Neg Hx     Social History Social History   Tobacco Use   Smoking status: Some Days    Current packs/day: 0.50    Types: Cigarettes    Passive exposure: Never   Smokeless tobacco: Never  Vaping Use   Vaping status: Never Used  Substance Use Topics   Alcohol use: Not Currently    Comment: sober x months   Drug use: Yes    Types: Marijuana    Comment: cocaine  in the past     Allergies   Aspirin and Ibuprofen    Review of Systems Review of Systems  Constitutional:  Negative for chills and fever.  HENT:  Negative for ear pain and sore throat.   Eyes:  Negative for pain and visual disturbance.  Respiratory:  Negative for cough and shortness of breath.   Cardiovascular:  Negative for chest pain and palpitations.   Gastrointestinal:  Negative for abdominal pain and vomiting.  Genitourinary:  Negative for dysuria and hematuria.  Musculoskeletal:  Positive for arthralgias (shoulder pain). Negative for back pain.  Skin:  Negative for color change and rash.  Neurological:  Negative for seizures and syncope.  All other systems reviewed and are negative.    Physical Exam Triage Vital Signs ED Triage Vitals  Encounter Vitals Group     BP 02/27/24 1641 (!) 161/83     Girls Systolic BP Percentile --      Girls Diastolic BP Percentile --  Boys Systolic BP Percentile --      Boys Diastolic BP Percentile --      Pulse Rate 02/27/24 1641 81     Resp 02/27/24 1641 18     Temp 02/27/24 1641 98.1 F (36.7 C)     Temp Source 02/27/24 1641 Oral     SpO2 02/27/24 1641 98 %     Weight --      Height --      Head Circumference --      Peak Flow --      Pain Score 02/27/24 1640 8     Pain Loc --      Pain Education --      Exclude from Growth Chart --    No data found.  Updated Vital Signs BP (!) 161/83 (BP Location: Right Arm)   Pulse 81   Temp 98.1 F (36.7 C) (Oral)   Resp 18   SpO2 98%   Visual Acuity Right Eye Distance:   Left Eye Distance:   Bilateral Distance:    Right Eye Near:   Left Eye Near:    Bilateral Near:     Physical Exam Vitals and nursing note reviewed.  Constitutional:      General: He is not in acute distress.    Appearance: He is well-developed.  HENT:     Head: Normocephalic and atraumatic.  Eyes:     Conjunctiva/sclera: Conjunctivae normal.  Cardiovascular:     Rate and Rhythm: Normal rate and regular rhythm.     Heart sounds: No murmur heard. Pulmonary:     Effort: Pulmonary effort is normal. No respiratory distress.     Breath sounds: Normal breath sounds.  Abdominal:     Palpations: Abdomen is soft.     Tenderness: There is no abdominal tenderness.  Musculoskeletal:        General: No swelling.     Left shoulder: Bony tenderness present. No  swelling. Decreased range of motion.     Cervical back: Neck supple.  Skin:    General: Skin is warm and dry.     Capillary Refill: Capillary refill takes less than 2 seconds.  Neurological:     Mental Status: He is alert.  Psychiatric:        Mood and Affect: Mood normal.      UC Treatments / Results  Labs (all labs ordered are listed, but only abnormal results are displayed) Labs Reviewed - No data to display  EKG   Radiology DG Shoulder Left Result Date: 02/27/2024 CLINICAL DATA:  Left shoulder pain EXAM: LEFT SHOULDER - 2+ VIEW COMPARISON:  Shoulder radiograph August 29, 2023. FINDINGS: Osseous fracture fragment overlying the left acromion (better assessed on prior radiograph from March 2025) correlating to a comminuted fracture of distal left clavicle. No glenohumeral joint dislocation. Soft tissues are unremarkable. Left axillary calcified subcentimeter lymph nodes. IMPRESSION: Distal clavicular fracture fragment with displacement. No soft tissue abnormality. Recommend CT for further assessment if clinically warranted. Electronically Signed   By: Megan  Zare M.D.   On: 02/27/2024 18:11    Procedures Procedures (including critical care time)  Medications Ordered in UC Medications - No data to display  Initial Impression / Assessment and Plan / UC Course  I have reviewed the triage vital signs and the nursing notes.  Pertinent labs & imaging results that were available during my care of the patient were reviewed by me and considered in my medical decision making (see chart for details).  Patient has distal clavicular fracture with displacement to left clavicle.  This is an old fracture from March.  Patient did not follow-up with orthopedic after he was seen in ED in March.  He does have a sling at home.  Advised sling and supportive care.  Advised follow-up with orthopedic as soon as possible. 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    ED Prescriptions   None    PDMP not reviewed this encounter.   Ward, Harlene PEDLAR, PA-C 02/27/24 1901

## 2024-02-27 NOTE — ED Triage Notes (Signed)
 Pt c/o left shoulder pain for 2 days. Denies any injury or lifting. Hasn't tried any measures to help with pain

## 2024-03-07 ENCOUNTER — Ambulatory Visit: Payer: Self-pay | Admitting: Nurse Practitioner

## 2024-03-18 ENCOUNTER — Telehealth: Payer: Self-pay | Admitting: Pharmacy Technician

## 2024-03-18 ENCOUNTER — Telehealth: Payer: Self-pay | Admitting: Neurology

## 2024-03-18 ENCOUNTER — Other Ambulatory Visit (HOSPITAL_COMMUNITY): Payer: Self-pay

## 2024-03-18 NOTE — Telephone Encounter (Signed)
 Pharmacy Patient Advocate Encounter   Received notification from Pt Calls Messages that prior authorization for INGREZZA  80MG  is required/requested.   Insurance verification completed.   The patient is insured through [facility] MEDICAID.   Per test claim: PA required; PA submitted to above mentioned insurance via Latent Key/confirmation #/EOC [facility] Status is pending

## 2024-03-18 NOTE — Telephone Encounter (Signed)
 Jonette from [employer] at [phone] called. Jonette stated that they need a prior authorization for the prescription called: Ingrezza . Thanks

## 2024-03-18 NOTE — Telephone Encounter (Signed)
 PA has been submitted, and telephone encounter has been created. Please see telephone encounter dated 10.14.25.

## 2024-03-19 ENCOUNTER — Other Ambulatory Visit (HOSPITAL_COMMUNITY): Payer: Self-pay

## 2024-03-19 NOTE — Telephone Encounter (Signed)
 Pharmacy Patient Advocate Encounter  Received notification from [employer] [place] MEDICAID that Prior Authorization for INGREZZA  80MG  has been DENIED.  Full denial letter will be uploaded to the media tab. See denial reason below.    PA #/Case ID/Reference #: [id number]

## 2024-03-19 NOTE — Telephone Encounter (Signed)
 Patient due to come in for appointment on 11/10 will have updated notes at that time

## 2024-03-22 ENCOUNTER — Encounter (HOSPITAL_COMMUNITY): Payer: Self-pay | Admitting: Emergency Medicine

## 2024-03-22 ENCOUNTER — Ambulatory Visit (HOSPITAL_COMMUNITY)
Admission: EM | Admit: 2024-03-22 | Discharge: 2024-03-22 | Disposition: A | Discharge status: Home / Self Care | Payer: MEDICAID | Attending: Family Medicine | Admitting: Family Medicine

## 2024-03-22 ENCOUNTER — Emergency Department (HOSPITAL_COMMUNITY)
Admission: EM | Admit: 2024-03-22 | Discharge: 2024-03-23 | Discharge status: Against Medical Advice | Payer: MEDICAID | Attending: Emergency Medicine | Admitting: Emergency Medicine

## 2024-03-22 ENCOUNTER — Ambulatory Visit (INDEPENDENT_AMBULATORY_CARE_PROVIDER_SITE_OTHER): Payer: MEDICAID

## 2024-03-22 ENCOUNTER — Encounter (HOSPITAL_COMMUNITY): Payer: Self-pay

## 2024-03-22 DIAGNOSIS — I10 Essential (primary) hypertension: Secondary | ICD-10-CM

## 2024-03-22 DIAGNOSIS — M79602 Pain in left arm: Secondary | ICD-10-CM | POA: Diagnosis not present

## 2024-03-22 DIAGNOSIS — N186 End stage renal disease: Secondary | ICD-10-CM | POA: Insufficient documentation

## 2024-03-22 DIAGNOSIS — M7989 Other specified soft tissue disorders: Secondary | ICD-10-CM | POA: Diagnosis not present

## 2024-03-22 DIAGNOSIS — T82848A Pain from vascular prosthetic devices, implants and grafts, initial encounter: Secondary | ICD-10-CM | POA: Diagnosis not present

## 2024-03-22 DIAGNOSIS — Z5329 Procedure and treatment not carried out because of patient's decision for other reasons: Secondary | ICD-10-CM | POA: Diagnosis not present

## 2024-03-22 DIAGNOSIS — D631 Anemia in chronic kidney disease: Secondary | ICD-10-CM | POA: Diagnosis not present

## 2024-03-22 DIAGNOSIS — Z992 Dependence on renal dialysis: Secondary | ICD-10-CM | POA: Insufficient documentation

## 2024-03-22 DIAGNOSIS — S00411A Abrasion of right ear, initial encounter: Secondary | ICD-10-CM | POA: Insufficient documentation

## 2024-03-22 DIAGNOSIS — X58XXXA Exposure to other specified factors, initial encounter: Secondary | ICD-10-CM | POA: Diagnosis not present

## 2024-03-22 DIAGNOSIS — H9201 Otalgia, right ear: Secondary | ICD-10-CM

## 2024-03-22 DIAGNOSIS — T82590A Other mechanical complication of surgically created arteriovenous fistula, initial encounter: Secondary | ICD-10-CM

## 2024-03-22 DIAGNOSIS — Y712 Prosthetic and other implants, materials and accessory cardiovascular devices associated with adverse incidents: Secondary | ICD-10-CM | POA: Insufficient documentation

## 2024-03-22 DIAGNOSIS — M25512 Pain in left shoulder: Secondary | ICD-10-CM

## 2024-03-22 DIAGNOSIS — I770 Arteriovenous fistula, acquired: Secondary | ICD-10-CM

## 2024-03-22 LAB — BASIC METABOLIC PANEL WITH GFR
Anion gap: 15 (ref 5–15)
BUN: 66 mg/dL — ABNORMAL HIGH (ref 6–20)
CO2: 17 mmol/L — ABNORMAL LOW (ref 22–32)
Calcium: 8.5 mg/dL — ABNORMAL LOW (ref 8.9–10.3)
Chloride: 109 mmol/L (ref 98–111)
Creatinine, Ser: 14.09 mg/dL — ABNORMAL HIGH (ref 0.61–1.24)
GFR, Estimated: 4 mL/min — ABNORMAL LOW (ref >60)
Glucose, Bld: 97 mg/dL (ref 70–99)
Potassium: 4.4 mmol/L (ref 3.5–5.1)
Sodium: 141 mmol/L (ref 135–145)

## 2024-03-22 LAB — CBC
HCT: 26.1 % — ABNORMAL LOW (ref 39.0–52.0)
Hemoglobin: 8.2 g/dL — ABNORMAL LOW (ref 13.0–17.0)
MCH: 30.7 pg (ref 26.0–34.0)
MCHC: 31.4 g/dL (ref 30.0–36.0)
MCV: 97.8 fL (ref 80.0–100.0)
Platelets: 225 K/uL (ref 150–400)
RBC: 2.67 MIL/uL — ABNORMAL LOW (ref 4.22–5.81)
RDW: 13.9 % (ref 11.5–15.5)
WBC: 7.6 K/uL (ref 4.0–10.5)
nRBC: 0 % (ref 0.0–0.2)

## 2024-03-22 NOTE — ED Provider Notes (Signed)
 [facility]   [id number] 03/22/24 Arrival Time: 1012  ASSESSMENT & PLAN:  1. Left arm swelling   2. Acute pain of left shoulder   3. Elevated blood pressure reading with diagnosis of hypertension   4. Left arm pain   5. Dialysis AV fistula malfunction, initial encounter    Needing higher level of care; discussed. Concern over L AV fistula malfunction. Last dialysis approx 1 week ago. To ED via POV; stable upon discharge.  I have personally viewed and independently interpreted the imaging studies ordered this visit. L shoulder and clavicle without acute changes when compared to previous.   Follow-up Information     Go to  [facility] Emergency Department at [facility].   Specialty: Emergency Medicine Contact information: [address] [place]  [address] [phone]                Reviewed expectations re: course of current medical issues. Questions answered. Outlined signs and symptoms indicating need for more acute intervention. Understanding verbalized. After Visit Summary given.   SUBJECTIVE: History from: Patient. William Gregory is a 38 y.o. male. Pt reports acute on chronic L shoulder pain and now with L forearm pain and swelling for the past 48 hours. Denies fever. Pain around fistula is getting worse. Last dialysis a week ago.  OBJECTIVE:  Vitals:   03/22/24 1121  BP: (!) 180/96  Pulse: 70  Resp: 20  SpO2: 98%   Elevated BP noted. General appearance: alert; no distress Eyes: PERRLA; EOMI; conjunctiva normal Extremities: L AV fistula present with surrounding swelling and with a persistent thrill with palpation Skin: warm and dry Neurologic: normal gait Psychological: alert and cooperative; normal mood and affect   Imaging: DG Clavicle Left Result Date: 03/22/2024 CLINICAL DATA:  Left shoulder and arm pain chronically. EXAM: LEFT CLAVICLE - 2+ VIEWS COMPARISON:  Left shoulder 02/27/2024 and  08/31/2023 FINDINGS: Exam demonstrates evidence of patient's old displaced non healed distal left clavicle fracture. No evidence of acute fracture. Left glenohumeral joint is normal. Minimal degenerate change of the left AC joint. Remainder of the exam is unremarkable. IMPRESSION: 1. No acute findings. 2. Old displaced non healed distal left clavicle fracture. Electronically Signed   By: Toribio Agreste M.D.   On: 03/22/2024 12:06   DG Shoulder Left Result Date: 03/22/2024 CLINICAL DATA:  Left shoulder and arm pain chronically. EXAM: LEFT SHOULDER - 2+ VIEW COMPARISON:  02/27/2024, 08/31/2023 FINDINGS: Evidence of patient's displaced non healed distal left clavicle fracture unchanged from recent prior exam 02/27/2024, although with significantly worse displacement at the non healed fracture site compared to March 2025. Left glenohumeral joint is normal. Mild degenerative changes over the [facility] joint. No acute fracture or dislocation. IMPRESSION: 1. No acute findings. 2. Old displaced non healed distal left clavicle fracture unchanged from 02/27/2024, although with significantly worse displacement at the fracture site compared to March 2025. Electronically Signed   By: Toribio Agreste M.D.   On: 03/22/2024 12:02    Allergies  Allergen Reactions   Aspirin Swelling   Ibuprofen  Rash    Past Medical History:  Diagnosis Date   Alcohol abuse    Arthritis    Asthma    Back pain    Chronic kidney disease (CKD)    Stage 5 Dialysis Tu/Th/Sa   Depression    GERD (gastroesophageal reflux disease)    Hypertension    Knee joint pain, left    Neck pain    Pneumonia  PONV (postoperative nausea and vomiting)    Psychiatric problem    unspecified by patient (receives injections bi-weekly)   Tobacco abuse    Social History   Socioeconomic History   Marital status: Significant Other    Spouse name: Not on file   Number of children: Not on file   Years of education: Not on file   Highest education  level: Not on file  Occupational History   Not on file  Tobacco Use   Smoking status: Some Days    Current packs/day: 0.50    Types: Cigarettes    Passive exposure: Never   Smokeless tobacco: Never  Vaping Use   Vaping status: Never Used  Substance and Sexual Activity   Alcohol use: Not Currently    Comment: sober x months   Drug use: Yes    Types: Marijuana    Comment: cocaine  in the past   Sexual activity: Yes  Other Topics Concern   Not on file  Social History Narrative   ** Merged History Encounter **       Right handed Unemployed/disability Ninth grade level of education   Social Drivers of Health   Financial Resource Strain: Not on file  Food Insecurity: Patient Unable To Answer (09/01/2023)   Hunger Vital Sign    Worried About Running Out of Food in the Last Year: Patient unable to answer    Ran Out of Food in the Last Year: Patient unable to answer  Transportation Needs: Patient Unable To Answer (09/01/2023)   PRAPARE - Transportation    Lack of Transportation (Medical): Patient unable to answer    Lack of Transportation (Non-Medical): Patient unable to answer  Physical Activity: Not on file  Stress: Not on file  Social Connections: Not on file  Intimate Partner Violence: Patient Unable To Answer (09/01/2023)   Humiliation, Afraid, Rape, and Kick questionnaire    Fear of Current or Ex-Partner: Patient unable to answer    Emotionally Abused: Patient unable to answer    Physically Abused: Patient unable to answer    Sexually Abused: Patient unable to answer   Family History  Problem Relation Age of Onset   Diabetes Mother    Diabetes Father    CVA Father    Dementia Maternal Grandmother    Kidney disease Maternal Grandmother    Heart attack Neg Hx    Past Surgical History:  Procedure Laterality Date   arm surgery     AV FISTULA PLACEMENT Left 07/19/2022   Procedure: LEFT ARM ARTERIOVENOUS (AV) FISTULA CREATION;  Surgeon: Serene Gaile ORN, MD;   Location: [facility];  Service: Vascular;  Laterality: Left;   IR FLUORO GUIDE CV LINE RIGHT  03/31/2022   IR PERC TUN PERIT CATH WO PORT S&I /IMAG  03/31/2022   IR US  GUIDE VASC ACCESS RIGHT  03/31/2022     Rolinda Rogue, MD 03/22/24 1248

## 2024-03-22 NOTE — ED Triage Notes (Signed)
 Pt is here with c/o ;eft shoulder and arm pain. Patient states he hurt his left shoulder years ago. Pain 10/10

## 2024-03-22 NOTE — ED Triage Notes (Signed)
 Pt here sent down from UC with c/o missing dialysis due to his arm swelling and warm to touch

## 2024-03-23 ENCOUNTER — Emergency Department (HOSPITAL_COMMUNITY): Payer: MEDICAID

## 2024-03-23 MED ORDER — LIDOCAINE VISCOUS HCL 2 % SOLUTION FOR USE IN EAR (ED/BUG EXTRACTION): 15.0000 mL | Freq: Once | OROMUCOSAL | Status: DC

## 2024-03-23 MED ORDER — CEFUROXIME AXETIL 500 MG PO TABS: 500.0000 mg | ORAL_TABLET | Freq: Two times a day (BID) | ORAL | 0 refills | Status: AC

## 2024-03-23 MED ORDER — OXYCODONE-ACETAMINOPHEN 5-325 MG PO TABS
1.0000 | ORAL_TABLET | Freq: Once | ORAL | Status: AC
  Administered 2024-03-23: 1 via ORAL
  Filled 2024-03-23: qty 1

## 2024-03-23 NOTE — ED Notes (Signed)
 Pt states that he must leave AMA to pick up grand child from bus and will return for HD. Understands that he will have to check in for new visit if he leaves and the risks associated. MD notified, but patient needed to leave before he could come to beside. A&Ox4, ambulatory, NAD, breathing unlabored.

## 2024-03-23 NOTE — Discharge Instructions (Addendum)
 Please follow-up with the [facility] and vascular Center this week for an AV fistulogram.  Otherwise follow-up with your dialysis center for routinely scheduled dialysis on Tuesday.  For your earache, antibiotics have been prescribed.  Please return to the emergency department if you develop any severe worsening shortness of breath in the setting of your missed dialysis sessions.  You were recommended to undergo dialysis tonight because of the finding of fluid on your lungs however you have elected to sign out AGAINST MEDICAL ADVICE.

## 2024-03-23 NOTE — ED Provider Notes (Addendum)
 [place] EMERGENCY DEPARTMENT AT [facility] Provider Note   CSN: [id number] Arrival date & time: 03/22/24  2022     Patient presents with: No chief complaint on file.   William Gregory is a 38 y.o. male.   HPI   38 year old male with medical history significant for ESRD on HD Tuesday Thursday Saturday presenting to the emergency department with multiple complaints.  The patient states that he is having pain and swelling at the site of his fistula on the left.  Initially seen at urgent care and thought to have potential infection of his fistula.    He was sent to the emergency department for further evaluation.  He states he missed his Saturday dialysis due to the fistula pain.  Additionally, he has been having some otalgia on the right.  No fever or chills. He has not received dialysis in a week.  Pain and swelling has been present for the past 48 hours.  Prior to Admission medications   Medication Sig Start Date End Date Taking? Authorizing Provider  cefUROXime (CEFTIN) 500 MG tablet Take 1 tablet (500 mg total) by mouth 2 (two) times daily with a meal for 5 days. 03/23/24 03/28/24 Yes Jerrol Agent, MD  acetaminophen  (TYLENOL ) 500 MG tablet Take 500 mg by mouth every 6 (six) hours as needed for moderate pain.    [provider]  albuterol  (VENTOLIN  HFA) 108 (90 Base) MCG/ACT inhaler Inhale 2 puffs into the lungs every 4 (four) hours as needed for wheezing or shortness of breath. 10/23/23   Paseda, Folashade R, FNP  amLODipine  (NORVASC ) 5 MG tablet Take 1 tablet (5 mg total) by mouth daily. 02/19/24   Paseda, Folashade R, FNP  benztropine  (COGENTIN ) 1 MG tablet Take 1 mg by mouth 2 (two) times daily.    [provider]  cinacalcet  (SENSIPAR ) 30 MG tablet Take 1 tablet (30 mg total) by mouth every evening. 11/16/23   Paseda, Folashade R, FNP  citalopram  (CELEXA ) 20 MG tablet Take 1 tablet (20 mg total) by mouth every morning. 11/16/23   Paseda, Folashade R,  FNP  cyclobenzaprine  (FLEXERIL ) 5 MG tablet Take 1 tablet (5 mg total) by mouth 3 (three) times daily as needed. 10/23/23   Paseda, Folashade R, FNP  fluticasone  (FLONASE ) 50 MCG/ACT nasal spray Place 2 sprays into both nostrils daily. 10/23/23   Paseda, Folashade R, FNP  ibuprofen  (ADVIL ) 600 MG tablet Take 1 tablet (600 mg total) by mouth every 8 (eight) hours as needed (severe pain 7-10). 10/23/23   Paseda, Folashade R, FNP  paliperidone Palmitate ER (INVEGA TRINZA) 819 MG/2.63ML injection Inject 819 mg into the muscle every 3 (three) months.    [provider]  sevelamer carbonate (RENVELA) 800 MG tablet Take 2,400 mg by mouth 3 (three) times daily with meals.    [provider]  valbenazine  (INGREZZA ) 80 MG capsule Samples of this drug were given to the patient, quantity 1, Lot Number [id number] 11/27/22   Tat, Asberry RAMAN, DO    Allergies: Aspirin and Ibuprofen     Review of Systems  All other systems reviewed and are negative.   Updated Vital Signs BP (!) 162/101   Pulse 73   Temp (!) 97.4 F (36.3 C) (Oral)   Resp 18   SpO2 100%   Physical Exam Vitals and nursing note reviewed.  Constitutional:      General: He is not in acute distress.    Appearance: He is well-developed.  HENT:  Head: Normocephalic and atraumatic.     Right Ear: Ear canal normal. Tympanic membrane is erythematous.     Left Ear: Tympanic membrane, ear canal and external ear normal.     Ears:     Comments: Abrasion to external ear Eyes:     Conjunctiva/sclera: Conjunctivae normal.  Cardiovascular:     Rate and Rhythm: Normal rate and regular rhythm.     Heart sounds: No murmur heard. Pulmonary:     Effort: Pulmonary effort is normal. No respiratory distress.     Breath sounds: Normal breath sounds.  Abdominal:     Palpations: Abdomen is soft.     Tenderness: There is no abdominal tenderness.  Musculoskeletal:        General: No swelling.     Cervical back: Neck supple.     Comments:  Left upper extremity AV fistula with audible bruit, palpable thrill, no erythema or warmth  Skin:    General: Skin is warm and dry.     Capillary Refill: Capillary refill takes less than 2 seconds.  Neurological:     Mental Status: He is alert.  Psychiatric:        Mood and Affect: Mood normal.     (all labs ordered are listed, but only abnormal results are displayed) Labs Reviewed  CBC - Abnormal; Notable for the following components:      Result Value   RBC 2.67 (*)    Hemoglobin 8.2 (*)    HCT 26.1 (*)    All other components within normal limits  BASIC METABOLIC PANEL WITH GFR - Abnormal; Notable for the following components:   CO2 17 (*)    BUN 66 (*)    Creatinine, Ser 14.09 (*)    Calcium 8.5 (*)    GFR, Estimated 4 (*)    All other components within normal limits    EKG: None  Radiology: DG Chest Portable 1 View Result Date: 03/23/2024 EXAM: 1 VIEW(S) XRAY OF THE CHEST 03/23/2024 11:09:00 AM COMPARISON: 08/31/2023 CLINICAL HISTORY: missed dialysis. Reason for exam: pt order states missed dialysis.; Per ED triage notes: Pt here sent down from UC with c/o missing dialysis due to his arm swelling and warm to touch FINDINGS: LINES, TUBES AND DEVICES: Right chest dialysis catheter removed. LUNGS AND PLEURA: Mild pulmonary edema. Increased interstitial prominence bilaterally. No focal pulmonary opacity. No pleural effusion. No pneumothorax. HEART AND MEDIASTINUM: Mild cardiomegaly. BONES AND SOFT TISSUES: Old fracture deformity of distal left clavicle. IMPRESSION: 1. Mild congestive heart failure. Electronically signed by: Rockey Kilts MD 03/23/2024 11:35 AM EDT RP Workstation: [id number]   DG Clavicle Left Result Date: 03/22/2024 CLINICAL DATA:  Left shoulder and arm pain chronically. EXAM: LEFT CLAVICLE - 2+ VIEWS COMPARISON:  Left shoulder 02/27/2024 and 08/31/2023 FINDINGS: Exam demonstrates evidence of patient's old displaced non healed distal left clavicle fracture. No  evidence of acute fracture. Left glenohumeral joint is normal. Minimal degenerate change of the left AC joint. Remainder of the exam is unremarkable. IMPRESSION: 1. No acute findings. 2. Old displaced non healed distal left clavicle fracture. Electronically Signed   By: Toribio Agreste M.D.   On: 03/22/2024 12:06   DG Shoulder Left Result Date: 03/22/2024 CLINICAL DATA:  Left shoulder and arm pain chronically. EXAM: LEFT SHOULDER - 2+ VIEW COMPARISON:  02/27/2024, 08/31/2023 FINDINGS: Evidence of patient's displaced non healed distal left clavicle fracture unchanged from recent prior exam 02/27/2024, although with significantly worse displacement at the non healed fracture site compared to March  2025. Left glenohumeral joint is normal. Mild degenerative changes over the [facility] joint. No acute fracture or dislocation. IMPRESSION: 1. No acute findings. 2. Old displaced non healed distal left clavicle fracture unchanged from 02/27/2024, although with significantly worse displacement at the fracture site compared to March 2025. Electronically Signed   By: Toribio Agreste M.D.   On: 03/22/2024 12:02     Procedures   Medications Ordered in the ED  oxyCODONE -acetaminophen  (PERCOCET/ROXICET) 5-325 MG per tablet 1 tablet (1 tablet Oral Given 03/23/24 1049)                                    Medical Decision Making Amount and/or Complexity of Data Reviewed Labs: ordered. Radiology: ordered.  Risk Prescription drug management.    38 year old male with medical history significant for ESRD on HD Tuesday Thursday Saturday presenting to the emergency department with multiple complaints.  The patient states that he is having pain and swelling at the site of his fistula on the left.  Initially seen at urgent care and thought to have potential infection of his fistula.    He was sent to the emergency department for further evaluation.  He states he missed his Saturday dialysis due to the fistula pain.  Additionally,  he has been having some otalgia on the right.  No fever or chills.  He has not received dialysis in a week.  Pain and swelling has been present for the past 48 hours.  On arrival, the patient was vitally stable, afebrile, not tachycardic or tachypneic.  On exam the patient had potential developing otitis media.  Will treat with a course of antibiotics.  The patient also had a palpable thrill and audible bruit.  His fistula does not appear to have any significant swelling and no erythema or warmth palpated.  Labs: BMP with a serum creatinine of 14.09, BUN 66, potassium 4.4.  CBC without a leukocytosis, anemia to 8.2 noted.  Patient has had no melena or hematochezia, hemoglobin appears to range between 9 and 11.  His lungs are clear to auscultation on exam and he is in no respiratory distress.  CXR: Mild pulmonary edema present  Discussed with Dr. Gretta of on-call vascular surgery, patient will need to follow-up with the [facility] this week for an AV fistulogram.  Recommended that he follow-up for his routinely scheduled dialysis on Tuesday.   Given the finding of mild pulmonary edema on chest x-ray, patient has missed about dialysis for the past week, nephrology consulted for consideration for dialysis.  Spoke with Dr. Geralynn of on-call nephrology, will plan for dialysis from the ED tonight.   I was informed by nursing staff that the patient requested to leave AGAINST MEDICAL ADVICE.  I informed him of the finding of pulmonary edema with his cough and worsening shortness of breath, I strongly recommended that the patient stay in the emergency department for hemodialysis.  The patient insisted that he preferred to leave and would be coming back to the emergency department.  I explained the risks of worsening pulmonary edema in the setting of missed hemodialysis sessions to the patient.  Patient understood and still elected to sign out AGAINST MEDICAL ADVICE, I believe the patient has  capacity to make that decision.     Final diagnoses:  Right ear pain  AV fistula  ESRD on hemodialysis [facility])    ED Discharge Orders  Ordered    cefUROXime (CEFTIN) 500 MG tablet  2 times daily with meals        03/23/24 1042               Jerrol Agent, MD 03/23/24 1250    Jerrol Agent, MD 03/23/24 1349

## 2024-03-24 ENCOUNTER — Other Ambulatory Visit: Payer: Self-pay

## 2024-03-24 ENCOUNTER — Encounter (HOSPITAL_COMMUNITY): Payer: Self-pay | Admitting: Emergency Medicine

## 2024-03-24 ENCOUNTER — Ambulatory Visit (HOSPITAL_COMMUNITY): Admission: RE | Admit: 2024-03-24 | Payer: MEDICAID | Source: Ambulatory Visit | Admitting: Vascular Surgery

## 2024-03-24 ENCOUNTER — Emergency Department (HOSPITAL_COMMUNITY)
Admission: EM | Admit: 2024-03-24 | Discharge: 2024-03-25 | Disposition: A | Payer: MEDICAID | Attending: Emergency Medicine | Admitting: Emergency Medicine

## 2024-03-24 ENCOUNTER — Encounter (HOSPITAL_COMMUNITY): Admission: RE | Payer: Self-pay | Source: Ambulatory Visit

## 2024-03-24 DIAGNOSIS — Z992 Dependence on renal dialysis: Secondary | ICD-10-CM | POA: Diagnosis not present

## 2024-03-24 DIAGNOSIS — H9201 Otalgia, right ear: Secondary | ICD-10-CM | POA: Insufficient documentation

## 2024-03-24 DIAGNOSIS — N186 End stage renal disease: Secondary | ICD-10-CM | POA: Diagnosis present

## 2024-03-24 DIAGNOSIS — Z5329 Procedure and treatment not carried out because of patient's decision for other reasons: Secondary | ICD-10-CM | POA: Insufficient documentation

## 2024-03-24 DIAGNOSIS — J810 Acute pulmonary edema: Secondary | ICD-10-CM

## 2024-03-24 LAB — COMPREHENSIVE METABOLIC PANEL WITH GFR
ALT: 20 U/L (ref 0–44)
AST: 16 U/L (ref 15–41)
Albumin: 4 g/dL (ref 3.5–5.0)
Alkaline Phosphatase: 89 U/L (ref 38–126)
Anion gap: 16 — ABNORMAL HIGH (ref 5–15)
BUN: 77 mg/dL — ABNORMAL HIGH (ref 6–20)
CO2: 17 mmol/L — ABNORMAL LOW (ref 22–32)
Calcium: 8.7 mg/dL — ABNORMAL LOW (ref 8.9–10.3)
Chloride: 107 mmol/L (ref 98–111)
Creatinine, Ser: 13.3 mg/dL — ABNORMAL HIGH (ref 0.61–1.24)
GFR, Estimated: 4 mL/min — ABNORMAL LOW (ref >60)
Glucose, Bld: 81 mg/dL (ref 70–99)
Potassium: 4.9 mmol/L (ref 3.5–5.1)
Sodium: 140 mmol/L (ref 135–145)
Total Bilirubin: 0.4 mg/dL (ref 0.0–1.2)
Total Protein: 6.7 g/dL (ref 6.5–8.1)

## 2024-03-24 LAB — CBC
HCT: 26.7 % — ABNORMAL LOW (ref 39.0–52.0)
Hemoglobin: 8.1 g/dL — ABNORMAL LOW (ref 13.0–17.0)
MCH: 29.3 pg (ref 26.0–34.0)
MCHC: 30.3 g/dL (ref 30.0–36.0)
MCV: 96.7 fL (ref 80.0–100.0)
Platelets: 218 K/uL (ref 150–400)
RBC: 2.76 MIL/uL — ABNORMAL LOW (ref 4.22–5.81)
RDW: 14 % (ref 11.5–15.5)
WBC: 8.4 K/uL (ref 4.0–10.5)
nRBC: 0 % (ref 0.0–0.2)

## 2024-03-24 SURGERY — A/V FISTULAGRAM
Anesthesia: LOCAL | Laterality: Left

## 2024-03-24 NOTE — ED Triage Notes (Addendum)
 Patient reports swollen fistula due to missing dialysis appointment on Saturday 10/18. He reports something moving around in his ear. Denies itching, hearing loss, and pain. Last dialysis treatment 1 week prior.  He requests dialysis

## 2024-03-24 NOTE — ED Provider Triage Note (Addendum)
 Emergency Medicine Provider Triage Evaluation Note  SUHAAN PERLEBERG , a 38 y.o. male  was evaluated in triage.  Pt complains of LUE pain over area of fistula. No fevers.  T/TH/Sat dialysis last dialysis was Thursday the 16th -- full session.  Was seen yesterday  No SOB  Review of Systems  Positive: LUE pain Negative: Fever   Physical Exam  BP (!) 156/86 (BP Location: Right Arm)   Pulse 84   Temp 98.2 F (36.8 C) (Oral)   Resp 20   SpO2 95%  Gen:   Awake, no distress   Resp:  Normal effort  MSK:   Moves extremities without difficulty  Other:  LUE fistula w good thrill and no erythema  Medical Decision Making  Medically screening exam initiated at 8:38 PM.  Appropriate orders placed.  Rayden E Sarvis was informed that the remainder of the evaluation will be completed by another provider, this initial triage assessment does not replace that evaluation, and the importance of remaining in the ED until their evaluation is complete.  Labs, CXR   Neldon Inoue [place], [place] 03/24/24 2039   Pt also mentions R ear pain. Seems he was prescribed abx by last EDP.   Neldon Inoue [place], [place] 03/24/24 2040

## 2024-03-25 ENCOUNTER — Other Ambulatory Visit: Payer: Self-pay

## 2024-03-25 ENCOUNTER — Emergency Department (HOSPITAL_COMMUNITY)
Admission: EM | Admit: 2024-03-25 | Discharge: 2024-03-26 | Disposition: A | Discharge status: Home / Self Care | Payer: MEDICAID | Source: Home / Self Care | Attending: Emergency Medicine | Admitting: Emergency Medicine

## 2024-03-25 ENCOUNTER — Emergency Department (HOSPITAL_COMMUNITY): Payer: MEDICAID

## 2024-03-25 ENCOUNTER — Encounter (HOSPITAL_COMMUNITY): Payer: Self-pay

## 2024-03-25 DIAGNOSIS — Z992 Dependence on renal dialysis: Secondary | ICD-10-CM | POA: Insufficient documentation

## 2024-03-25 DIAGNOSIS — N186 End stage renal disease: Secondary | ICD-10-CM | POA: Insufficient documentation

## 2024-03-25 LAB — CBC WITH DIFFERENTIAL/PLATELET
Abs Immature Granulocytes: 0.01 K/uL (ref 0.00–0.07)
Basophils Absolute: 0.1 K/uL (ref 0.0–0.1)
Basophils Relative: 1 %
Eosinophils Absolute: 0.2 K/uL (ref 0.0–0.5)
Eosinophils Relative: 3 %
HCT: 26.4 % — ABNORMAL LOW (ref 39.0–52.0)
Hemoglobin: 8.3 g/dL — ABNORMAL LOW (ref 13.0–17.0)
Immature Granulocytes: 0 %
Lymphocytes Relative: 21 %
Lymphs Abs: 1.7 K/uL (ref 0.7–4.0)
MCH: 30.5 pg (ref 26.0–34.0)
MCHC: 31.4 g/dL (ref 30.0–36.0)
MCV: 97.1 fL (ref 80.0–100.0)
Monocytes Absolute: 0.6 K/uL (ref 0.1–1.0)
Monocytes Relative: 7 %
Neutro Abs: 5.7 K/uL (ref 1.7–7.7)
Neutrophils Relative %: 68 %
Platelets: 226 K/uL (ref 150–400)
RBC: 2.72 MIL/uL — ABNORMAL LOW (ref 4.22–5.81)
RDW: 14 % (ref 11.5–15.5)
WBC: 8.3 K/uL (ref 4.0–10.5)
nRBC: 0 % (ref 0.0–0.2)

## 2024-03-25 LAB — BASIC METABOLIC PANEL WITH GFR
Anion gap: 14 (ref 5–15)
BUN: 85 mg/dL — ABNORMAL HIGH (ref 6–20)
CO2: 15 mmol/L — ABNORMAL LOW (ref 22–32)
Calcium: 8.7 mg/dL — ABNORMAL LOW (ref 8.9–10.3)
Chloride: 112 mmol/L — ABNORMAL HIGH (ref 98–111)
Creatinine, Ser: 13.76 mg/dL — ABNORMAL HIGH (ref 0.61–1.24)
GFR, Estimated: 4 mL/min — ABNORMAL LOW (ref >60)
Glucose, Bld: 111 mg/dL — ABNORMAL HIGH (ref 70–99)
Potassium: 4.6 mmol/L (ref 3.5–5.1)
Sodium: 141 mmol/L (ref 135–145)

## 2024-03-25 MED ORDER — CHLORHEXIDINE GLUCONATE CLOTH 2 % EX PADS: 6.0000 | MEDICATED_PAD | Freq: Every day | CUTANEOUS | Status: DC

## 2024-03-25 NOTE — ED Notes (Signed)
 Patient wanted to leave, was provided with sandwich, beverage and bus pass.  Patient states he will get himself to dialysis.  Patient stated he was upset because they did nothing for his ear.

## 2024-03-25 NOTE — Progress Notes (Signed)
 Patient ID: William Gregory, male   DOB: 1985-12-30, 38 y.o.   MRN: [id number]  Consulted by Dr. Dasie to coordinate dialysis for William Gregory who his last dialysis treatment was about 5 days ago.  [name]  He provides no clarity about why he did not go to his outpatient dialysis unit.  I will order for ER dialysis (provision of dialysis in the kidney dialysis unit followed by discharge back to the emergency room for reevaluation/triage and potential discharge).  Because the patient is currently at [facility] long, there will need to be a coordinated effort to get him to the kidney dialysis unit at [place] and then transfer/discharge to the [facility] emergency room based on my conversation with Dr. Dasie rather than having him transfer back to the [facility] emergency room.  William Blanch MD [facility]. Office # [phone] Pager # [phone] 9:05 AM

## 2024-03-25 NOTE — ED Triage Notes (Addendum)
 Patient here because he said that his doctor called his mom and told him he needed dialysis. He is at the desk eating french fries.

## 2024-03-25 NOTE — Progress Notes (Signed)
 Late Note Entry- October 21 at 10:45 am  Contacted [facility] on [address] to be made aware of the fact that pt was at ED and then left. Clinic advised that hopefully pt will come to HD appt today which is pt's normal HD day.   Randine Mungo Dialysis Navigator [phone]

## 2024-03-25 NOTE — ED Provider Notes (Addendum)
 [place] EMERGENCY DEPARTMENT AT [facility] Provider Note   CSN: [id number] Arrival date & time: 03/24/24  8051     Patient presents with: fistula (pt. Requests dialysis, reports swollen fistula) and Otalgia   William Gregory is a 38 y.o. male.   38 year old male with history of ERCd who gets dialysis Tuesday Thursday Saturday and last dialysis was approximately 5 days ago who presents requesting dialysis.  Also has had right-sided ear pain for some time.  Patient does not want to answer questions.  States that he has been short of breath.  Review of the records show that patient was seen 2 days ago and was to have dialysis due to patient being pulmonary edema.  Patient left AMA and states he did this because he had to babysit his grandchild.  Patient does have a prior history of unknown psychiatric disorder.  He refuses to give any further information at this time.       Prior to Admission medications   Medication Sig Start Date End Date Taking? Authorizing Provider  acetaminophen  (TYLENOL ) 500 MG tablet Take 500 mg by mouth every 6 (six) hours as needed for moderate pain.    [provider]  albuterol  (VENTOLIN  HFA) 108 (90 Base) MCG/ACT inhaler Inhale 2 puffs into the lungs every 4 (four) hours as needed for wheezing or shortness of breath. 10/23/23   Paseda, Folashade R, FNP  amLODipine  (NORVASC ) 5 MG tablet Take 1 tablet (5 mg total) by mouth daily. 02/19/24   Paseda, Folashade R, FNP  benztropine  (COGENTIN ) 1 MG tablet Take 1 mg by mouth 2 (two) times daily.    [provider]  cefUROXime (CEFTIN) 500 MG tablet Take 1 tablet (500 mg total) by mouth 2 (two) times daily with a meal for 5 days. 03/23/24 03/28/24  Jerrol Agent, MD  cinacalcet  (SENSIPAR ) 30 MG tablet Take 1 tablet (30 mg total) by mouth every evening. 11/16/23   Paseda, Folashade R, FNP  citalopram  (CELEXA ) 20 MG tablet Take 1 tablet (20 mg total) by mouth every morning. 11/16/23    Paseda, Folashade R, FNP  cyclobenzaprine  (FLEXERIL ) 5 MG tablet Take 1 tablet (5 mg total) by mouth 3 (three) times daily as needed. 10/23/23   Paseda, Folashade R, FNP  fluticasone  (FLONASE ) 50 MCG/ACT nasal spray Place 2 sprays into both nostrils daily. 10/23/23   Paseda, Folashade R, FNP  ibuprofen  (ADVIL ) 600 MG tablet Take 1 tablet (600 mg total) by mouth every 8 (eight) hours as needed (severe pain 7-10). 10/23/23   Paseda, Folashade R, FNP  paliperidone Palmitate ER (INVEGA TRINZA) 819 MG/2.63ML injection Inject 819 mg into the muscle every 3 (three) months.    [provider]  sevelamer carbonate (RENVELA) 800 MG tablet Take 2,400 mg by mouth 3 (three) times daily with meals.    [provider]  valbenazine  (INGREZZA ) 80 MG capsule Samples of this drug were given to the patient, quantity 1, Lot Number [id number] 11/27/22   Tat, Asberry RAMAN, DO    Allergies: Aspirin and Ibuprofen     Review of Systems  Unable to perform ROS: Psychiatric disorder    Updated Vital Signs BP (!) 151/88   Pulse 77   Temp 97.9 F (36.6 C) (Oral)   Resp 19   SpO2 99%   Physical Exam Vitals and nursing note reviewed.  Constitutional:      General: He is not in acute distress.    Appearance: Normal appearance. He is well-developed. He is  not toxic-appearing.  HENT:     Head: Normocephalic and atraumatic.  Eyes:     General: Lids are normal.     Conjunctiva/sclera: Conjunctivae normal.     Pupils: Pupils are equal, round, and reactive to light.  Neck:     Thyroid: No thyroid mass.     Trachea: No tracheal deviation.  Cardiovascular:     Rate and Rhythm: Normal rate and regular rhythm.     Heart sounds: Normal heart sounds. No murmur heard.    No gallop.  Pulmonary:     Effort: Pulmonary effort is normal. No respiratory distress.     Breath sounds: Normal breath sounds. No stridor. No decreased breath sounds, wheezing, rhonchi or rales.  Abdominal:     General: There is no  distension.     Palpations: Abdomen is soft.     Tenderness: There is no abdominal tenderness. There is no rebound.  Musculoskeletal:        General: No tenderness. Normal range of motion.     Cervical back: Normal range of motion and neck supple.  Skin:    General: Skin is warm and dry.     Findings: No abrasion or rash.  Neurological:     Mental Status: He is alert and oriented to person, place, and time. Mental status is at baseline.     GCS: GCS eye subscore is 4. GCS verbal subscore is 5. GCS motor subscore is 6.     Cranial Nerves: No cranial nerve deficit.     Sensory: No sensory deficit.     Motor: Motor function is intact.  Psychiatric:        Attention and Perception: He is inattentive.        Mood and Affect: Affect is labile and angry.        Speech: Speech normal.        Behavior: Behavior is agitated and aggressive.     (all labs ordered are listed, but only abnormal results are displayed) Labs Reviewed  COMPREHENSIVE METABOLIC PANEL WITH GFR - Abnormal; Notable for the following components:      Result Value   CO2 17 (*)    BUN 77 (*)    Creatinine, Ser 13.30 (*)    Calcium 8.7 (*)    GFR, Estimated 4 (*)    Anion gap 16 (*)    All other components within normal limits  CBC - Abnormal; Notable for the following components:   RBC 2.76 (*)    Hemoglobin 8.1 (*)    HCT 26.7 (*)    All other components within normal limits  I-STAT CHEM 8, ED    EKG: EKG Interpretation Date/Time:  Monday March 24 2024 20:50:25 EDT Ventricular Rate:  81 PR Interval:  140 QRS Duration:  89 QT Interval:  387 QTC Calculation: 450 R Axis:   68  Text Interpretation: Sinus rhythm Borderline ST depression, diffuse leads Abnormal T, consider ischemia, inferior leads Confirmed by Dasie Faden ([id number]) on 03/25/2024 7:07:40 AM  Radiology: DG Chest Portable 1 View Result Date: 03/23/2024 EXAM: 1 VIEW(S) XRAY OF THE CHEST 03/23/2024 11:09:00 AM COMPARISON: 08/31/2023 CLINICAL  HISTORY: missed dialysis. Reason for exam: pt order states missed dialysis.; Per ED triage notes: Pt here sent down from UC with c/o missing dialysis due to his arm swelling and warm to touch FINDINGS: LINES, TUBES AND DEVICES: Right chest dialysis catheter removed. LUNGS AND PLEURA: Mild pulmonary edema. Increased interstitial prominence bilaterally. No focal pulmonary opacity. No  pleural effusion. No pneumothorax. HEART AND MEDIASTINUM: Mild cardiomegaly. BONES AND SOFT TISSUES: Old fracture deformity of distal left clavicle. IMPRESSION: 1. Mild congestive heart failure. Electronically signed by: Rockey Kilts MD 03/23/2024 11:35 AM EDT RP Workstation: [id number]     Procedures   Medications Ordered in the ED - No data to display                                  Medical Decision Making Amount and/or Complexity of Data Reviewed Radiology: ordered.   Patient is EKG shows normal sinus rhythm.  No acute peaked T waves.  Patient's testing were normal here.  Chest x-ray per my dictation shows some increased pulmonary vascular congestion consistent with likely heart failure.  Discussed with Dr. Tobie from nephrology and they will call for patient to go to dialysis 9:18 AM     Informed by nursing that patient requested leave AMA. Final diagnoses:  None    ED Discharge Orders     None          Dasie Faden, MD 03/25/24 9193    Dasie Faden, MD 03/25/24 [phone]

## 2024-03-25 NOTE — ED Triage Notes (Signed)
 Says he has missed 4 days of Dialysis. (T, TH, SA).   Also complaint of severe pain in left arm where his fistula is.

## 2024-03-25 NOTE — ED Notes (Signed)
 No answer after call out

## 2024-03-25 NOTE — ED Notes (Signed)
 Pt refused EKG, pt stated he didn't feel like coming back to the triage room again and wanted provider to just look at previous one.

## 2024-03-26 MED ORDER — CHLORHEXIDINE GLUCONATE CLOTH 2 % EX PADS: 6.0000 | MEDICATED_PAD | Freq: Every day | CUTANEOUS | Status: DC

## 2024-03-26 NOTE — Progress Notes (Signed)
  [facility] Progress Note    Consulted by ED for William Gregory. He presented to [facility] yesterday but left AMA before receiving dialysis. Returned to [facility] today. He denies SOB, CP, dizziness, nausea. Denies any issues cannulating AVF and reporting some pain to light touch of AVF but inconsistent (sometimes reports pain on palpation but then denies pain when I touch the same spot). His outpatient HD unit is concerned about his stability for outpatient HD since he has gone so long without treatment. Will plan for dialysis here, asked him to please stay for HD to prevent worsening SOB/uremia.  Lucie Collet, PA-C 03/26/2024, 9:08 AM  [facility] Pager: [phone]

## 2024-03-26 NOTE — ED Notes (Signed)
 Pt refused all vitals. RN informed.

## 2024-03-26 NOTE — ED Notes (Signed)
 Patient is sleep in waiting respiratory area.

## 2024-03-26 NOTE — ED Notes (Signed)
 Pt yelling, cussing out loud and remains agitated.

## 2024-03-26 NOTE — ED Provider Notes (Signed)
 [place] EMERGENCY DEPARTMENT AT [facility] Provider Note   CSN: [id number] Arrival date & time: 03/25/24  1749     Patient presents with: Vascular Access Problem   William Gregory is a 38 y.o. male.   38 year old male with history of ESRD on dialysis Tuesday, Thursday, Saturday returns to the emergency room with request for dialysis.  Patient is presented to the ER several times in the past few days for same and has not stay for treatment.  He does not answer questions at this time.  Patient came to the ER yesterday, was seen and dialysis was planned however he left AMA. Prior to yesterday, he was seen 03/23/24, pulmonary edema on CXR and again left AMA.     Prior to Admission medications   Medication Sig Start Date End Date Taking? Authorizing Provider  acetaminophen  (TYLENOL ) 500 MG tablet Take 500 mg by mouth every 6 (six) hours as needed for moderate pain.    [provider]  albuterol  (VENTOLIN  HFA) 108 (90 Base) MCG/ACT inhaler Inhale 2 puffs into the lungs every 4 (four) hours as needed for wheezing or shortness of breath. 10/23/23   Paseda, Folashade R, FNP  amLODipine  (NORVASC ) 5 MG tablet Take 1 tablet (5 mg total) by mouth daily. 02/19/24   Paseda, Folashade R, FNP  benztropine  (COGENTIN ) 1 MG tablet Take 1 mg by mouth 2 (two) times daily.    [provider]  cefUROXime (CEFTIN) 500 MG tablet Take 1 tablet (500 mg total) by mouth 2 (two) times daily with a meal for 5 days. 03/23/24 03/28/24  Jerrol Agent, MD  cinacalcet  (SENSIPAR ) 30 MG tablet Take 1 tablet (30 mg total) by mouth every evening. 11/16/23   Paseda, Folashade R, FNP  citalopram  (CELEXA ) 20 MG tablet Take 1 tablet (20 mg total) by mouth every morning. 11/16/23   Paseda, Folashade R, FNP  cyclobenzaprine  (FLEXERIL ) 5 MG tablet Take 1 tablet (5 mg total) by mouth 3 (three) times daily as needed. 10/23/23   Paseda, Folashade R, FNP  fluticasone  (FLONASE ) 50 MCG/ACT nasal spray Place  2 sprays into both nostrils daily. 10/23/23   Paseda, Folashade R, FNP  ibuprofen  (ADVIL ) 600 MG tablet Take 1 tablet (600 mg total) by mouth every 8 (eight) hours as needed (severe pain 7-10). 10/23/23   Paseda, Folashade R, FNP  paliperidone Palmitate ER (INVEGA TRINZA) 819 MG/2.63ML injection Inject 819 mg into the muscle every 3 (three) months.    [provider]  sevelamer carbonate (RENVELA) 800 MG tablet Take 2,400 mg by mouth 3 (three) times daily with meals.    [provider]  valbenazine  (INGREZZA ) 80 MG capsule Samples of this drug were given to the patient, quantity 1, Lot Number [id number] 11/27/22   Tat, Asberry RAMAN, DO    Allergies: Aspirin and Ibuprofen     Review of Systems Level 5 caveat as patient refuses to answer questions.  Updated Vital Signs BP 134/71   Pulse 71   Temp 97.8 F (36.6 C) (Oral)   Resp 15   Ht 5' 8 (1.727 m)   Wt 81.6 kg   SpO2 100%   BMI 27.37 kg/m   Physical Exam Vitals and nursing note reviewed.  Constitutional:      General: He is not in acute distress.    Appearance: He is well-developed. He is not diaphoretic.     Comments: Sleeping, rouses to verbal stimuli, mumbles and goes back to sleep.   HENT:  Head: Normocephalic and atraumatic.  Cardiovascular:     Rate and Rhythm: Normal rate and regular rhythm.     Heart sounds: Normal heart sounds.  Pulmonary:     Effort: Pulmonary effort is normal.     Breath sounds: Normal breath sounds.  Musculoskeletal:     Right lower leg: No edema.     Left lower leg: No edema.  Skin:    General: Skin is warm and dry.  Neurological:     Mental Status: He is oriented to person, place, and time.  Psychiatric:        Behavior: Behavior normal.     (all labs ordered are listed, but only abnormal results are displayed) Labs Reviewed  CBC WITH DIFFERENTIAL/PLATELET - Abnormal; Notable for the following components:      Result Value   RBC 2.72 (*)    Hemoglobin 8.3 (*)    HCT  26.4 (*)    All other components within normal limits  BASIC METABOLIC PANEL WITH GFR - Abnormal; Notable for the following components:   Chloride 112 (*)    CO2 15 (*)    Glucose, Bld 111 (*)    BUN 85 (*)    Creatinine, Ser 13.76 (*)    Calcium 8.7 (*)    GFR, Estimated 4 (*)    All other components within normal limits    EKG: None  Radiology: [facility] Chest Port 1 View Result Date: 03/25/2024 CLINICAL DATA:  Shortness of breath.  Missed dialysis appointment. EXAM: PORTABLE CHEST 1 VIEW COMPARISON:  03/23/2024 FINDINGS: Patient is rotated to the left. Lungs are somewhat hypoinflated demonstrate mild hazy prominence of the central pulmonary vessels which may be due to mild vascular congestion versus combination of low lung volumes and prominent overlying soft tissues. Cardiomediastinal silhouette is unremarkable. Old ununited displaced distal left clavicle fracture. Remainder of the exam is unchanged. IMPRESSION: Suggestion of mild vascular congestion versus combination of low lung volumes and prominent overlying soft tissues. Electronically Signed   By: Toribio Agreste M.D.   On: 03/25/2024 08:03     Procedures   Medications Ordered in the ED - No data to display                                  Medical Decision Making  38 year old male ESRD with Tuesday, Thursday, Saturday dialysis schedule return to the emergency room.  Patient was here yesterday however left before he could have dialysis.  Dialysis coordinator encouraged patient to go to his dialysis appointment on Tuesday.  He did not go.  Patient returns today.  He is difficult and that he does not wish to participate in history or exam today.  He rolls over and prefers to go back to sleep instead.  His labs are reviewed and are without significant changes from prior.  His potassium is normal.  His lungs are clear.  He is satting at 100% on room air, do not suspect pulmonary edema. Secure chat sent to dialysis coordinator and  oncoming PA in the ER.  Ideally, if patient can have dialysis completed outpatient otherwise, may need to arrange for ER dialysis if patient will stay.     Final diagnoses:  ESRD (end stage renal disease) on dialysis [facility])    ED Discharge Orders     None          Beverley Leita DELENA DEVONNA 03/26/24 [id number]    Griselda,  Almarie, MD 03/26/24 2240

## 2024-03-26 NOTE — ED Notes (Signed)
 Pt becoming very aggressive towards this RN and NT.  Pt refusing to stay; not wanting to sign anything.  Refusing all vital signs.

## 2024-03-26 NOTE — ED Provider Notes (Signed)
 Accepted handoff at shift change from [place], [place]. Please see prior provider note for more detail.   Briefly: Patient is 38 y.o.   DDX: concern for HD, cellulitis, electrolyte derangements  Plan: Coordinate with HD team for hospital dialysis as patient has been non-compliant with sessions for over one week but labs look surprisingly well at this point.  Physical Exam  BP 134/71   Pulse 71   Temp 97.8 F (36.6 C) (Oral)   Resp 15   Ht 5' 8 (1.727 m)   Wt 81.6 kg   SpO2 100%   BMI 27.37 kg/m   Physical Exam Vitals and nursing note reviewed.  Constitutional:      General: He is not in acute distress.    Appearance: He is well-developed. He is not diaphoretic.     Comments: Sleeping, rouses to verbal stimuli, mumbles and goes back to sleep.   HENT:     Head: Normocephalic and atraumatic.  Cardiovascular:     Rate and Rhythm: Normal rate and regular rhythm.     Heart sounds: Normal heart sounds.     Comments: Palpable thrill over left AV fistula site. Pulmonary:     Effort: Pulmonary effort is normal.     Breath sounds: Normal breath sounds.  Musculoskeletal:     Right lower leg: No edema.     Left lower leg: No edema.  Skin:    General: Skin is warm and dry.     Comments: No cellulitic changes found to left AV fistula site.  Neurological:     Mental Status: He is oriented to person, place, and time.  Psychiatric:        Behavior: Behavior normal.     Procedures  Procedures  ED Course / MDM    Medical Decision Making  Patient is a handoff from [place], [place]. In brief, patient is here with concerns of ESRD on HD and multiple missed dialysis sessions. Stated that he has tried to come into the hospital for these but has left multiple times primarily due to delays with getting session started. He denies CP or SOB.  Exam is unremarkable. Palpable thrill noted to the left AV fistula site with no cellulitic changes of the skin.  Dr. Tobie and Lucie Collet, PA-C  will evaluate patient and place orders for HD. Anticipate patient can be discharged after HD is completed.  Patient has chosen to elope again. Patient had requested to go out and smoke but I advised that would not be possible as he is currently here as a patient and offered nicotine  gum in the meantime but he declined. He stated he wants to get out of this city cause it's not good for [him]. As patient is requiring due to multiple doses, do not feel that he is safe to be discharged at this time. He has chosen to elope before completing AGAINST MEDICAL ADVICE paperwork.       Waldron Gerry A, PA-C 03/26/24 1001    Bari Roxie HERO, DO 03/26/24 1115

## 2024-03-26 NOTE — ED Notes (Signed)
Pt refusing all vital signs 

## 2024-03-26 NOTE — ED Notes (Signed)
 Pt seen walking away stating he was leaving.

## 2024-03-26 NOTE — ED Notes (Signed)
 Pt putting on his jackets saying I'm bout to go I've been here too long.

## 2024-03-26 NOTE — Progress Notes (Signed)
 Contacted by ED staff regarding pt this am. Navigator contacted [facility] this morning to inquire if clinic has an appt for today to treat pt. At that time, clinic did not have availability. Pt has not received HD treatment at clinic since 10/11 and clinic has concerns regarding pt's stability for out-pt HD since it has been so long since pt's last treatment. Clinic staff also voiced concern that pt may be having access issues as well. This info was provided to ED provider, nephrologist, and renal PA. Plan had been for pt to receive HD here today. Navigator then received call from [facility] staff to say an appt has become available for today for pt. At that time, noted pt has left ED AMA. Contacted [facility] charge RN to be advised that pt has left ED without HD. Clinic staff to attempt to contact pt and/or pt's mother to inquire if pt will go to clinic for treatment. Hospital staff advised that clinic staff to attempt to reach pt re: appt availability for today.   William Gregory Dialysis Navigator [phone]

## 2024-03-26 NOTE — ED Notes (Signed)
 PA Zelaya at bedside per patient request.  Patient has been yelling out at staff and becoming more agitated/irritated as morning progresses.  PA notified and aware.

## 2024-03-30 ENCOUNTER — Other Ambulatory Visit: Payer: Self-pay

## 2024-03-30 ENCOUNTER — Encounter (HOSPITAL_COMMUNITY): Payer: Self-pay

## 2024-03-30 ENCOUNTER — Emergency Department (HOSPITAL_COMMUNITY)
Admission: EM | Admit: 2024-03-30 | Discharge: 2024-03-30 | Disposition: A | Discharge status: Home / Self Care | Payer: MEDICAID | Attending: Emergency Medicine | Admitting: Emergency Medicine

## 2024-03-30 DIAGNOSIS — H9209 Otalgia, unspecified ear: Secondary | ICD-10-CM | POA: Diagnosis not present

## 2024-03-30 DIAGNOSIS — J45909 Unspecified asthma, uncomplicated: Secondary | ICD-10-CM | POA: Diagnosis not present

## 2024-03-30 DIAGNOSIS — I120 Hypertensive chronic kidney disease with stage 5 chronic kidney disease or end stage renal disease: Secondary | ICD-10-CM | POA: Insufficient documentation

## 2024-03-30 DIAGNOSIS — Z79899 Other long term (current) drug therapy: Secondary | ICD-10-CM | POA: Insufficient documentation

## 2024-03-30 DIAGNOSIS — R059 Cough, unspecified: Secondary | ICD-10-CM | POA: Diagnosis not present

## 2024-03-30 DIAGNOSIS — N185 Chronic kidney disease, stage 5: Secondary | ICD-10-CM | POA: Diagnosis not present

## 2024-03-30 DIAGNOSIS — B349 Viral infection, unspecified: Secondary | ICD-10-CM | POA: Diagnosis not present

## 2024-03-30 DIAGNOSIS — J029 Acute pharyngitis, unspecified: Secondary | ICD-10-CM | POA: Diagnosis present

## 2024-03-30 DIAGNOSIS — Z720 Tobacco use: Secondary | ICD-10-CM | POA: Insufficient documentation

## 2024-03-30 LAB — RESP PANEL BY RT-PCR (RSV, FLU A&B, COVID)  RVPGX2
Influenza A by PCR: NEGATIVE
Influenza B by PCR: NEGATIVE
Resp Syncytial Virus by PCR: NEGATIVE
SARS Coronavirus 2 by RT PCR: NEGATIVE

## 2024-03-30 NOTE — Discharge Instructions (Addendum)
 Thank you for coming to [facility] Emergency Department. You were seen for symptoms of a viral illness.  Please eat and drink well at home.  Please attend your dialysis as originally scheduled.  Please follow up with your primary care provider within 1 week.   Do not hesitate to return to the ED or call 911 if you experience: -Worsening symptoms -Cough, chest pain, shortness of breath -Lightheadedness, passing out -Fevers/chills -Anything else that concerns you

## 2024-03-30 NOTE — ED Triage Notes (Signed)
 Pt from home via bus.  Pt reports bilateral ear pain starting x1 week ago.  Pt reports cough and nasal congestion starting at same time.

## 2024-03-30 NOTE — ED Provider Notes (Signed)
 [place] EMERGENCY DEPARTMENT AT [facility] Provider Note   CSN: [id number] Arrival date & time: 03/30/24  2014     History  Chief Complaint  Patient presents with   Otalgia    William Gregory is a 38 y.o. male with PMH as listed below who presents with nasal congestion, ear pain, sore throat. Symptoms started a few days ago. No f/c, cough, SOB, chest pain, leg swelling. States he went to dialysis yesterday. No other complaints.  Has repeatedly presented to the ED for dialysis in the past and left AGAINST MEDICAL ADVICE.  He has had pulmonary edema on his chest x-ray on 03/23/2024 but today he denies any chest pain, shortness of breath, cough, orthopnea.   Past Medical History:  Diagnosis Date   Alcohol abuse    Arthritis    Asthma    Back pain    Chronic kidney disease (CKD)    Stage 5 Dialysis Tu/Th/Sa   Depression    GERD (gastroesophageal reflux disease)    Hypertension    Knee joint pain, left    Neck pain    Pneumonia    PONV (postoperative nausea and vomiting)    Psychiatric problem    unspecified by patient (receives injections bi-weekly)   Tobacco abuse        Home Medications Prior to Admission medications   Medication Sig Start Date End Date Taking? Authorizing Provider  acetaminophen  (TYLENOL ) 500 MG tablet Take 500 mg by mouth every 6 (six) hours as needed for moderate pain.    [provider]  albuterol  (VENTOLIN  HFA) 108 (90 Base) MCG/ACT inhaler Inhale 2 puffs into the lungs every 4 (four) hours as needed for wheezing or shortness of breath. 10/23/23   Paseda, Folashade R, FNP  amLODipine  (NORVASC ) 5 MG tablet Take 1 tablet (5 mg total) by mouth daily. 02/19/24   Paseda, Folashade R, FNP  benztropine  (COGENTIN ) 1 MG tablet Take 1 mg by mouth 2 (two) times daily.    [provider]  cinacalcet  (SENSIPAR ) 30 MG tablet Take 1 tablet (30 mg total) by mouth every evening. 11/16/23   Paseda, Folashade R, FNP  citalopram   (CELEXA ) 20 MG tablet Take 1 tablet (20 mg total) by mouth every morning. 11/16/23   Paseda, Folashade R, FNP  cyclobenzaprine  (FLEXERIL ) 5 MG tablet Take 1 tablet (5 mg total) by mouth 3 (three) times daily as needed. 10/23/23   Paseda, Folashade R, FNP  fluticasone  (FLONASE ) 50 MCG/ACT nasal spray Place 2 sprays into both nostrils daily. 10/23/23   Paseda, Folashade R, FNP  ibuprofen  (ADVIL ) 600 MG tablet Take 1 tablet (600 mg total) by mouth every 8 (eight) hours as needed (severe pain 7-10). 10/23/23   Paseda, Folashade R, FNP  paliperidone Palmitate ER (INVEGA TRINZA) 819 MG/2.63ML injection Inject 819 mg into the muscle every 3 (three) months.    [provider]  sevelamer carbonate (RENVELA) 800 MG tablet Take 2,400 mg by mouth 3 (three) times daily with meals.    [provider]  valbenazine  (INGREZZA ) 80 MG capsule Samples of this drug were given to the patient, quantity 1, Lot Number [id number] 11/27/22   Tat, Asberry RAMAN, DO      Allergies    Aspirin and Ibuprofen     Review of Systems   Review of Systems A 10 point review of systems was performed and is negative unless otherwise reported in HPI.  Physical Exam Updated Vital Signs BP (!) 147/85 (BP Location: Right Arm)  Pulse 95   Temp 98.3 F (36.8 C) (Oral)   Resp 18   Ht 5' 5 (1.651 m)   Wt 81.6 kg   SpO2 97%   BMI 29.94 kg/m  Physical Exam General: Normal appearing male, lying in bed.  HEENT: PERRLA, Sclera anicteric, MMM, trachea midline.  Clear oropharynx, symmetric tonsillar pillars, no tonsillar exudate, normal-appearing uvula.  No posterior oropharyngeal fullness.  No erythema. Cardiology: RRR, no murmurs/rubs/gallops. Resp: Normal respiratory rate and effort. CTAB, no wheezes, rhonchi, crackles.  Abd: Soft, non-tender, non-distended. No rebound tenderness or guarding.  GU: Deferred. MSK: Fistula in left upper extremity with palpable thrill.  No peripheral edema or signs of trauma.  Skin: warm, dry.   Neuro: A&Ox4, CNs II-XII grossly intact. MAEs. Sensation grossly intact.  Psych: Normal mood and affect.   ED Results / Procedures / Treatments   Labs (all labs ordered are listed, but only abnormal results are displayed) Labs Reviewed  RESP PANEL BY RT-PCR (RSV, FLU A&B, COVID)  [id number]    EKG None  Radiology No results found.  Procedures Procedures    Medications Ordered in ED Medications - No data to display  ED Course/ Medical Decision Making/ A&P                          Medical Decision Making Amount and/or Complexity of Data Reviewed Labs:  Decision-making details documented in ED Course.    This patient presents to the ED for concern of nasal congestion and ear fullness, this involves an extensive number of treatment options, and is a complaint that carries with it a high risk of complications and morbidity.  I considered the following differential and admission for this acute, potentially life threatening condition.  Patient is very well-appearing and hemodynamically stable.  MDM:    Patient does not appear volume overloaded on exam.  He has no complaints except sore throat, ear fullness, nasal congestion.  He has an upper respiratory viral syndrome.  He is hemodynamically stable with no hypoxia, Rester distress, abnormal lung sounds, or severe hypertension.  He has no AOM or AOE on exam.  Symmetric tonsillar pillars and no sign of PTA, RPA.  No tonsillar exudates to indicate strep throat. Patient was offered Tylenol  but states he cannot take it because he is allergic to it.  Will avoid ibuprofen  given end-stage renal disease.  Patient had labs checked on 10/19 and 10/21 which were reassuring and consistent with ESRD.  Given no other acute complaints will discharge with outpatient follow-up and instructions to follow-up with dialysis center.  Clinical Course as of 03/30/24 2234  [place] Mar 30, 2024  2227 Resp panel by RT-PCR (RSV, Flu A&B, Covid) Anterior Nasal  Swab neg [HN]    Clinical Course User Index [HN] William Sid SAILOR, MD    Labs: Viral swab ordered from triage  Additional history obtained from chart review.    Social Determinants of Health:  lives independently  Disposition:  DC w/ discharge instructions/return precautions. All questions answered to patient's satisfaction.    Co morbidities that complicate the patient evaluation  Past Medical History:  Diagnosis Date   Alcohol abuse    Arthritis    Asthma    Back pain    Chronic kidney disease (CKD)    Stage 5 Dialysis Tu/Th/Sa   Depression    GERD (gastroesophageal reflux disease)    Hypertension    Knee joint pain, left    Neck pain  Pneumonia    PONV (postoperative nausea and vomiting)    Psychiatric problem    unspecified by patient (receives injections bi-weekly)   Tobacco abuse      Medicines No orders of the defined types were placed in this encounter.   I have reviewed the patients home medicines and have made adjustments as needed  Problem List / ED Course: Problem List Items Addressed This Visit   None Visit Diagnoses       Acute viral syndrome    -  Primary                   This note was created using dictation software, which may contain spelling or grammatical errors.    William Sid SAILOR, MD 03/30/24 [phone]
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
## 2024-04-12 ENCOUNTER — Emergency Department (HOSPITAL_COMMUNITY)
Admission: EM | Admit: 2024-04-12 | Discharge: 2024-04-12 | Disposition: A | Discharge status: Home / Self Care | Payer: MEDICAID | Attending: Emergency Medicine | Admitting: Emergency Medicine

## 2024-04-12 ENCOUNTER — Emergency Department (HOSPITAL_COMMUNITY): Payer: MEDICAID

## 2024-04-12 DIAGNOSIS — S8011XA Contusion of right lower leg, initial encounter: Secondary | ICD-10-CM | POA: Insufficient documentation

## 2024-04-12 DIAGNOSIS — Z5900 Homelessness unspecified: Secondary | ICD-10-CM | POA: Diagnosis not present

## 2024-04-12 DIAGNOSIS — Y92481 Parking lot as the place of occurrence of the external cause: Secondary | ICD-10-CM | POA: Diagnosis not present

## 2024-04-12 DIAGNOSIS — M79661 Pain in right lower leg: Secondary | ICD-10-CM | POA: Diagnosis present

## 2024-04-12 DIAGNOSIS — R519 Headache, unspecified: Secondary | ICD-10-CM | POA: Insufficient documentation

## 2024-04-12 DIAGNOSIS — I770 Arteriovenous fistula, acquired: Secondary | ICD-10-CM | POA: Diagnosis not present

## 2024-04-12 DIAGNOSIS — Y900 Blood alcohol level of less than 20 mg/100 ml: Secondary | ICD-10-CM | POA: Diagnosis not present

## 2024-04-12 DIAGNOSIS — W19XXXA Unspecified fall, initial encounter: Secondary | ICD-10-CM

## 2024-04-12 LAB — BASIC METABOLIC PANEL WITH GFR
Anion gap: 14 (ref 5–15)
BUN: 50 mg/dL — ABNORMAL HIGH (ref 6–20)
CO2: 19 mmol/L — ABNORMAL LOW (ref 22–32)
Calcium: 8.9 mg/dL (ref 8.9–10.3)
Chloride: 107 mmol/L (ref 98–111)
Creatinine, Ser: 13.19 mg/dL — ABNORMAL HIGH (ref 0.61–1.24)
GFR, Estimated: 4 mL/min — ABNORMAL LOW (ref >60)
Glucose, Bld: 79 mg/dL (ref 70–99)
Potassium: 4.1 mmol/L (ref 3.5–5.1)
Sodium: 140 mmol/L (ref 135–145)

## 2024-04-12 LAB — CBC WITH DIFFERENTIAL/PLATELET
Abs Immature Granulocytes: 0.03 K/uL (ref 0.00–0.07)
Basophils Absolute: 0.1 K/uL (ref 0.0–0.1)
Basophils Relative: 1 %
Eosinophils Absolute: 0.1 K/uL (ref 0.0–0.5)
Eosinophils Relative: 2 %
HCT: 29.1 % — ABNORMAL LOW (ref 39.0–52.0)
Hemoglobin: 9.1 g/dL — ABNORMAL LOW (ref 13.0–17.0)
Immature Granulocytes: 0 %
Lymphocytes Relative: 22 %
Lymphs Abs: 1.5 K/uL (ref 0.7–4.0)
MCH: 30.3 pg (ref 26.0–34.0)
MCHC: 31.3 g/dL (ref 30.0–36.0)
MCV: 97 fL (ref 80.0–100.0)
Monocytes Absolute: 0.6 K/uL (ref 0.1–1.0)
Monocytes Relative: 9 %
Neutro Abs: 4.6 K/uL (ref 1.7–7.7)
Neutrophils Relative %: 66 %
Platelets: 236 K/uL (ref 150–400)
RBC: 3 MIL/uL — ABNORMAL LOW (ref 4.22–5.81)
RDW: 13.2 % (ref 11.5–15.5)
WBC: 6.9 K/uL (ref 4.0–10.5)
nRBC: 0 % (ref 0.0–0.2)

## 2024-04-12 LAB — I-STAT CHEM 8, ED
BUN: 51 mg/dL — ABNORMAL HIGH (ref 6–20)
Calcium, Ion: 1.2 mmol/L (ref 1.15–1.40)
Chloride: 110 mmol/L (ref 98–111)
Creatinine, Ser: 14 mg/dL — ABNORMAL HIGH (ref 0.61–1.24)
Glucose, Bld: 77 mg/dL (ref 70–99)
HCT: 28 % — ABNORMAL LOW (ref 39.0–52.0)
Hemoglobin: 9.5 g/dL — ABNORMAL LOW (ref 13.0–17.0)
Potassium: 4.7 mmol/L (ref 3.5–5.1)
Sodium: 142 mmol/L (ref 135–145)
TCO2: 20 mmol/L — ABNORMAL LOW (ref 22–32)

## 2024-04-12 LAB — ETHANOL: Alcohol, Ethyl (B): <15 mg/dL (ref <15)

## 2024-04-12 MED ORDER — FENTANYL CITRATE (PF) 50 MCG/ML IJ SOSY
50.0000 ug | PREFILLED_SYRINGE | Freq: Once | INTRAMUSCULAR | Status: AC
  Administered 2024-04-12: 50 ug via INTRAMUSCULAR

## 2024-04-12 MED ORDER — FENTANYL CITRATE (PF) 50 MCG/ML IJ SOSY
50.0000 ug | PREFILLED_SYRINGE | Freq: Once | INTRAMUSCULAR | Status: DC
  Filled 2024-04-12: qty 1

## 2024-04-12 MED ORDER — HYDROCODONE-ACETAMINOPHEN 5-325 MG PO TABS
1.0000 | ORAL_TABLET | Freq: Once | ORAL | Status: AC
  Administered 2024-04-12: 1 via ORAL
  Filled 2024-04-12: qty 1
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
## 2024-04-12 NOTE — ED Notes (Signed)
 Pt is complaining of L. Thigh pain. States it feels like the muscle is jumping.   EDP aware.

## 2024-04-12 NOTE — Discharge Instructions (Signed)
 Return for any problem.   Go directly to your dialysis center so that she can get dialyzed today.
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
## 2024-04-12 NOTE — ED Notes (Signed)
 Pt had difficult time with pain in scans.  IM Fentanyl  given around 1105.  Pt is tearful on arrival back to room.
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
## 2024-04-14 ENCOUNTER — Ambulatory Visit: Payer: MEDICAID | Admitting: Neurology

## 2024-04-14 ENCOUNTER — Other Ambulatory Visit: Payer: Self-pay | Admitting: Nurse Practitioner

## 2024-04-14 ENCOUNTER — Encounter: Payer: Self-pay | Admitting: Neurology

## 2024-04-14 VITALS — BP 150/84 | HR 79 | Wt 194.4 lb

## 2024-04-14 DIAGNOSIS — G2401 Drug induced subacute dyskinesia: Secondary | ICD-10-CM | POA: Diagnosis not present

## 2024-04-14 DIAGNOSIS — I10 Essential (primary) hypertension: Secondary | ICD-10-CM

## 2024-04-14 MED ORDER — AMLODIPINE BESYLATE 5 MG PO TABS: 5.0000 mg | ORAL_TABLET | Freq: Every day | ORAL | 1 refills | Status: AC

## 2024-04-15 ENCOUNTER — Telehealth: Payer: Self-pay | Admitting: Neurology

## 2024-04-15 ENCOUNTER — Telehealth: Payer: Self-pay | Admitting: Pharmacy Technician

## 2024-04-15 ENCOUNTER — Other Ambulatory Visit (HOSPITAL_COMMUNITY): Payer: Self-pay

## 2024-04-15 NOTE — Telephone Encounter (Signed)
 Who's calling (name and relationship to patient) : ShaterraBETHA Paschal  Best contact number: [phone]  Provider they see: Dr.Tat  Reason for call: Shaterra lvm stating that she is following up on a prior auth for the Ingrezza  80 mg. She stated that it was denied by health plan. She is following up on appeal status.

## 2024-04-15 NOTE — Telephone Encounter (Signed)
 PA has been submitted, and telephone encounter has been created. Please see telephone encounter dated 11.11.25.

## 2024-04-15 NOTE — Telephone Encounter (Addendum)
 Pharmacy Patient Advocate Encounter   Received notification from Patient Advice Request messages that prior authorization for INGREZZA  80MG  is required/requested.   Insurance verification completed.   The patient is insured through [facility] MEDICAID.   Per test claim: PA required; PA started via CoverMyMeds. KEY [id number] . Please see clinical question(s) below that I am not finding the answer to in their chart and advise.

## 2024-04-15 NOTE — Telephone Encounter (Signed)
 Massie from [facility]  at [phone] called in this morning and she stated that they need a prior authorization for prescription called: Ingrezza . Thanks

## 2024-04-16 NOTE — Telephone Encounter (Signed)
 Patient has discontinued this medication and is not going to be coming to the office at this time

## 2024-04-16 NOTE — Telephone Encounter (Signed)
 PA request has been cancelled.

## 2024-04-21 NOTE — Telephone Encounter (Signed)
 William Gregory with [facility] called in wanting to speak with the nurse that handles the P.A's. There was an order was due back on 03/17/24, for Ingrezza . They will need a authorization in order to dispense.   PH: [phone]  She stated that the insurance for P.A's can also be contacted at:   [facility] [phone],  Fax: [phone]

## 2024-04-23 ENCOUNTER — Telehealth (HOSPITAL_COMMUNITY): Payer: Self-pay

## 2024-04-23 ENCOUNTER — Ambulatory Visit: Payer: MEDICAID | Admitting: Orthopaedic Surgery
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
## 2024-04-23 NOTE — Telephone Encounter (Signed)
 Please advise [facility]

## 2024-04-24 ENCOUNTER — Other Ambulatory Visit: Payer: Self-pay

## 2024-04-24 ENCOUNTER — Encounter (HOSPITAL_COMMUNITY): Payer: Self-pay | Admitting: *Deleted

## 2024-04-24 ENCOUNTER — Emergency Department (HOSPITAL_COMMUNITY)
Admission: EM | Admit: 2024-04-24 | Discharge: 2024-04-24 | Discharge status: Against Medical Advice | Payer: MEDICAID | Attending: Emergency Medicine | Admitting: Emergency Medicine

## 2024-04-24 DIAGNOSIS — Z5329 Procedure and treatment not carried out because of patient's decision for other reasons: Secondary | ICD-10-CM | POA: Insufficient documentation

## 2024-04-24 DIAGNOSIS — M791 Myalgia, unspecified site: Secondary | ICD-10-CM | POA: Insufficient documentation

## 2024-04-24 LAB — CBC WITH DIFFERENTIAL/PLATELET
Abs Immature Granulocytes: 0.03 K/uL (ref 0.00–0.07)
Basophils Absolute: 0.1 K/uL (ref 0.0–0.1)
Basophils Relative: 1 %
Eosinophils Absolute: 0.2 K/uL (ref 0.0–0.5)
Eosinophils Relative: 3 %
HCT: 30.4 % — ABNORMAL LOW (ref 39.0–52.0)
Hemoglobin: 9.5 g/dL — ABNORMAL LOW (ref 13.0–17.0)
Immature Granulocytes: 0 %
Lymphocytes Relative: 20 %
Lymphs Abs: 1.5 K/uL (ref 0.7–4.0)
MCH: 30.1 pg (ref 26.0–34.0)
MCHC: 31.3 g/dL (ref 30.0–36.0)
MCV: 96.2 fL (ref 80.0–100.0)
Monocytes Absolute: 0.6 K/uL (ref 0.1–1.0)
Monocytes Relative: 8 %
Neutro Abs: 5.1 K/uL (ref 1.7–7.7)
Neutrophils Relative %: 68 %
Platelets: 256 K/uL (ref 150–400)
RBC: 3.16 MIL/uL — ABNORMAL LOW (ref 4.22–5.81)
RDW: 13.8 % (ref 11.5–15.5)
WBC: 7.5 K/uL (ref 4.0–10.5)
nRBC: 0 % (ref 0.0–0.2)

## 2024-04-24 LAB — BASIC METABOLIC PANEL WITH GFR
Anion gap: 15 (ref 5–15)
BUN: 65 mg/dL — ABNORMAL HIGH (ref 6–20)
CO2: 14 mmol/L — ABNORMAL LOW (ref 22–32)
Calcium: 8.3 mg/dL — ABNORMAL LOW (ref 8.9–10.3)
Chloride: 112 mmol/L — ABNORMAL HIGH (ref 98–111)
Creatinine, Ser: 13.72 mg/dL — ABNORMAL HIGH (ref 0.61–1.24)
GFR, Estimated: 4 mL/min — ABNORMAL LOW (ref >60)
Glucose, Bld: 81 mg/dL (ref 70–99)
Potassium: 5.1 mmol/L (ref 3.5–5.1)
Sodium: 141 mmol/L (ref 135–145)
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
## 2024-04-24 NOTE — ED Notes (Signed)
 Called pt 3x for vitals, and received no response.

## 2024-04-26 ENCOUNTER — Emergency Department (HOSPITAL_COMMUNITY): Payer: MEDICAID

## 2024-04-26 ENCOUNTER — Emergency Department (HOSPITAL_COMMUNITY)
Admission: EM | Admit: 2024-04-26 | Discharge: 2024-04-27 | Discharge status: Against Medical Advice | Payer: MEDICAID | Attending: Emergency Medicine | Admitting: Emergency Medicine

## 2024-04-26 ENCOUNTER — Other Ambulatory Visit: Payer: Self-pay

## 2024-04-26 ENCOUNTER — Encounter (HOSPITAL_COMMUNITY): Payer: Self-pay

## 2024-04-26 DIAGNOSIS — R600 Localized edema: Secondary | ICD-10-CM | POA: Insufficient documentation

## 2024-04-26 DIAGNOSIS — Z992 Dependence on renal dialysis: Secondary | ICD-10-CM | POA: Diagnosis not present

## 2024-04-26 DIAGNOSIS — R52 Pain, unspecified: Secondary | ICD-10-CM

## 2024-04-26 DIAGNOSIS — Z5900 Homelessness unspecified: Secondary | ICD-10-CM | POA: Diagnosis not present

## 2024-04-26 DIAGNOSIS — R011 Cardiac murmur, unspecified: Secondary | ICD-10-CM | POA: Insufficient documentation

## 2024-04-26 DIAGNOSIS — N186 End stage renal disease: Secondary | ICD-10-CM | POA: Diagnosis not present

## 2024-04-26 DIAGNOSIS — M791 Myalgia, unspecified site: Secondary | ICD-10-CM | POA: Insufficient documentation

## 2024-04-26 LAB — CBC WITH DIFFERENTIAL/PLATELET
Abs Immature Granulocytes: 0.02 K/uL (ref 0.00–0.07)
Basophils Absolute: 0.1 K/uL (ref 0.0–0.1)
Basophils Relative: 1 %
Eosinophils Absolute: 0.2 K/uL (ref 0.0–0.5)
Eosinophils Relative: 3 %
HCT: 28.5 % — ABNORMAL LOW (ref 39.0–52.0)
Hemoglobin: 9.1 g/dL — ABNORMAL LOW (ref 13.0–17.0)
Immature Granulocytes: 0 %
Lymphocytes Relative: 28 %
Lymphs Abs: 2 K/uL (ref 0.7–4.0)
MCH: 30.6 pg (ref 26.0–34.0)
MCHC: 31.9 g/dL (ref 30.0–36.0)
MCV: 96 fL (ref 80.0–100.0)
Monocytes Absolute: 0.5 K/uL (ref 0.1–1.0)
Monocytes Relative: 7 %
Neutro Abs: 4.5 K/uL (ref 1.7–7.7)
Neutrophils Relative %: 61 %
Platelets: 251 K/uL (ref 150–400)
RBC: 2.97 MIL/uL — ABNORMAL LOW (ref 4.22–5.81)
RDW: 13.9 % (ref 11.5–15.5)
WBC: 7.3 K/uL (ref 4.0–10.5)
nRBC: 0 % (ref 0.0–0.2)

## 2024-04-26 LAB — COMPREHENSIVE METABOLIC PANEL WITH GFR
ALT: 12 U/L (ref 0–44)
AST: 14 U/L — ABNORMAL LOW (ref 15–41)
Albumin: 3.4 g/dL — ABNORMAL LOW (ref 3.5–5.0)
Alkaline Phosphatase: 73 U/L (ref 38–126)
Anion gap: 16 — ABNORMAL HIGH (ref 5–15)
BUN: 76 mg/dL — ABNORMAL HIGH (ref 6–20)
CO2: 15 mmol/L — ABNORMAL LOW (ref 22–32)
Calcium: 8 mg/dL — ABNORMAL LOW (ref 8.9–10.3)
Chloride: 110 mmol/L (ref 98–111)
Creatinine, Ser: 14.13 mg/dL — ABNORMAL HIGH (ref 0.61–1.24)
GFR, Estimated: 4 mL/min — ABNORMAL LOW (ref >60)
Glucose, Bld: 90 mg/dL (ref 70–99)
Potassium: 5.1 mmol/L (ref 3.5–5.1)
Sodium: 141 mmol/L (ref 135–145)
Total Bilirubin: 0.5 mg/dL (ref 0.0–1.2)
Total Protein: 6.3 g/dL — ABNORMAL LOW (ref 6.5–8.1)
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
## 2024-04-27 LAB — RESP PANEL BY RT-PCR (RSV, FLU A&B, COVID)  RVPGX2
Influenza A by PCR: NEGATIVE
Influenza B by PCR: NEGATIVE
Resp Syncytial Virus by PCR: NEGATIVE
SARS Coronavirus 2 by RT PCR: NEGATIVE

## 2024-04-27 MED ORDER — CHLORHEXIDINE GLUCONATE CLOTH 2 % EX PADS: 6.0000 | MEDICATED_PAD | Freq: Every day | CUTANEOUS | Status: DC

## 2024-04-27 NOTE — ED Provider Notes (Addendum)
 [place] EMERGENCY DEPARTMENT AT [facility] Provider Note   CSN: [id number] Arrival date & time: 04/26/24  2200     Patient presents with: Generalized Body Aches and needs dialysis    William Gregory is a 38 y.o. male who is on hemodialysis with AV fistula in the left upper extremity who is also recently homeless, lack of transportation at this time resulting in poor compliance with HD.  Last HD session reportedly 11/15 patient without shortness of breath palpitations or chest pain at this time.  Reports body aches when sleeping outside.   HPI     Prior to Admission medications   Medication Sig Start Date End Date Taking? Authorizing Provider  acetaminophen  (TYLENOL ) 500 MG tablet Take 500 mg by mouth every 6 (six) hours as needed for moderate pain.    [provider]  albuterol  (VENTOLIN  HFA) 108 (90 Base) MCG/ACT inhaler Inhale 2 puffs into the lungs every 4 (four) hours as needed for wheezing or shortness of breath. 10/23/23   Paseda, Folashade R, FNP  amLODipine  (NORVASC ) 5 MG tablet Take 1 tablet (5 mg total) by mouth daily. 04/14/24   Paseda, Folashade R, FNP  benztropine  (COGENTIN ) 1 MG tablet Take 1 mg by mouth 2 (two) times daily.    [provider]  cinacalcet  (SENSIPAR ) 30 MG tablet Take 1 tablet (30 mg total) by mouth every evening. 11/16/23   Paseda, Folashade R, FNP  citalopram  (CELEXA ) 20 MG tablet Take 1 tablet (20 mg total) by mouth every morning. 11/16/23   Paseda, Folashade R, FNP  cyclobenzaprine  (FLEXERIL ) 5 MG tablet Take 1 tablet (5 mg total) by mouth 3 (three) times daily as needed. 10/23/23   Paseda, Folashade R, FNP  fluticasone  (FLONASE ) 50 MCG/ACT nasal spray Place 2 sprays into both nostrils daily. Patient not taking: Reported on 04/14/2024 10/23/23   Paseda, Folashade R, FNP  ibuprofen  (ADVIL ) 600 MG tablet Take 1 tablet (600 mg total) by mouth every 8 (eight) hours as needed (severe pain 7-10). 10/23/23   Paseda, Folashade R,  FNP  paliperidone Palmitate ER (INVEGA TRINZA) 819 MG/2.63ML injection Inject 819 mg into the muscle every 3 (three) months. Patient not taking: Reported on 04/14/2024    [provider]  sevelamer carbonate (RENVELA) 800 MG tablet Take 2,400 mg by mouth 3 (three) times daily with meals.    [provider]  valbenazine  (INGREZZA ) 80 MG capsule Samples of this drug were given to the patient, quantity 1, Lot Number [id number] 11/27/22   Tat, Asberry RAMAN, DO    Allergies: Aspirin and Ibuprofen     Review of Systems  Musculoskeletal:  Positive for myalgias.    Updated Vital Signs BP (!) 168/92 (BP Location: Right Arm)   Pulse 68   Temp 98.2 F (36.8 C) (Oral)   Resp 18   SpO2 100%   Physical Exam Vitals and nursing note reviewed.  Constitutional:      Appearance: He is obese. He is not ill-appearing or toxic-appearing.  HENT:     Head: Normocephalic and atraumatic.     Mouth/Throat:     Mouth: Mucous membranes are moist.     Pharynx: No oropharyngeal exudate or posterior oropharyngeal erythema.  Eyes:     General:        Right eye: No discharge.        Left eye: No discharge.     Conjunctiva/sclera: Conjunctivae normal.  Cardiovascular:     Rate and Rhythm: Normal rate and regular  rhythm.     Pulses: Normal pulses.     Heart sounds: Murmur heard.  Pulmonary:     Effort: Pulmonary effort is normal. No respiratory distress.     Breath sounds: Normal breath sounds. No wheezing or rales.  Abdominal:     General: Bowel sounds are normal. There is no distension.     Palpations: Abdomen is soft.     Tenderness: There is no abdominal tenderness.  Musculoskeletal:        General: No deformity.     Cervical back: Neck supple.     Right lower leg: 1+ Edema present.     Left lower leg: 1+ Edema present.  Skin:    General: Skin is warm and dry.     Capillary Refill: Capillary refill takes less than 2 seconds.  Neurological:     General: No focal deficit present.      Mental Status: He is alert and oriented to person, place, and time. Mental status is at baseline.  Psychiatric:        Mood and Affect: Mood normal.     (all labs ordered are listed, but only abnormal results are displayed) Labs Reviewed  CBC WITH DIFFERENTIAL/PLATELET - Abnormal; Notable for the following components:      Result Value   RBC 2.97 (*)    Hemoglobin 9.1 (*)    HCT 28.5 (*)    All other components within normal limits  COMPREHENSIVE METABOLIC PANEL WITH GFR - Abnormal; Notable for the following components:   CO2 15 (*)    BUN 76 (*)    Creatinine, Ser 14.13 (*)    Calcium 8.0 (*)    Total Protein 6.3 (*)    Albumin 3.4 (*)    AST 14 (*)    GFR, Estimated 4 (*)    Anion gap 16 (*)    All other components within normal limits  RESP PANEL BY RT-PCR (RSV, FLU A&B, COVID)  [id number]    EKG: None  Radiology: DG Chest 2 View Result Date: 04/26/2024 EXAM: 2 VIEW(S) XRAY OF THE CHEST 04/26/2024 10:35:00 PM COMPARISON: 04/12/2024 CLINICAL HISTORY: wekaness wekaness FINDINGS: LUNGS AND PLEURA: No focal pulmonary opacity. No pleural effusion. No pneumothorax. HEART AND MEDIASTINUM: No acute abnormality of the cardiac and mediastinal silhouettes. BONES AND SOFT TISSUES: No acute osseous abnormality. IMPRESSION: 1. No acute cardiopulmonary process. Electronically signed by: Franky Crease MD 04/26/2024 10:47 PM EST RP Workstation: [id number]     Procedures   Medications Ordered in the ED - No data to display  Clinical Course as of 04/27/24 0717  Sun Apr 27, 2024  9352 Counseled to Dr. Geralynn, nephrologist to feels patient should be dialyzed today, will dialyze the patient and transport him back to the ED for discharge given no acute medical indication for admission at this time.  I previous collaboration in the care of this patient. [RS]  0700 Plan is to go to dialysis and come back [CH]  0700 Homeless, 8 days since last session [CH]    Clinical Course User Index [CH]  Hinnant, Collin F, PA-C [RS] Latashia Koch, Pleasant SAUNDERS, PA-C                                 Medical Decision Making 38 y/o male on HD, presents with bodyaches.  Hypertensive on intake, vitals otherwise normal.  Troponins unremarkable, abdominal exam is benign.  Patient with palpable thrill in AV  fistula to left upper extremity.  Overall well-appearing.  Amount and/or Complexity of Data Reviewed Labs: ordered.    Details: CBC with anemia hemoglobin 9-year patient's baseline.  CMP with creatinine of 14.13, elevated BUN of 76 Radiology: ordered. ECG/medicine tests:     Details: EKG with sinus rhythm with peaked T waves, no QTc prolongation.   Case discussed with nephrologist as above regarding role of HD in this patient's case given no acute indication for HD or admission, however given poor social circumstances patient will be dialyzed before returning back to the ER for discharge later this morning.  I appreciate his collaboration.  Dakhari voiced understanding of her medical evaluation and treatment plan. Each of their questions answered to their expressed satisfaction. Care of this patient signed out to oncoming ED provider [facility], PA-C at time of shift change. All pertinent HPI, physical exam, and laboratory findings were discussed with them prior to my departure. Disposition of patient pending completion of workup, reevaluation, and clinical judgement of oncoming ED provider.    This chart was dictated using voice recognition software, Dragon. Despite the best efforts of this provider to proofread and correct errors, errors may still occur which can change documentation meaning.      Final diagnoses:  None    ED Discharge Orders     None         Bobette Pleasant JONELLE DEVONNA 04/27/24 9281    Haze Lonni PARAS, MD 04/27/24 [phone]

## 2024-04-27 NOTE — Care Management (Signed)
 Shelter resources and [employer] on AVS.
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
## 2024-04-27 NOTE — ED Provider Notes (Signed)
 Patient signed out to myself by previous provider Pleasant Cruel PA-C, please see her note for further detail.  At time of signout nephrology has already been contacted by previous EDP and patient is being set up for dialysis.  I was notified by nursing staff that the patient became upset and left the emergency department.  This patient left the emergency department prior to my assessment and I was unable to give the patient return precautions.  Per nursing note it appears patient signed himself out AGAINST MEDICAL ADVICE.   Most likely diagnosis at this time ongoing sequela of chronic kidney disease on dialysis in a difficult social situation, patient offered dialysis and was set up for dialysis here in the emergency department today however patient left AGAINST MEDICAL ADVICE prior to completion of dialysis.   Janetta Terrall FALCON, [place] 04/27/24 1644    Garrick Charleston, MD 04/28/24 [phone]

## 2024-04-27 NOTE — ED Notes (Signed)
 Pt is angry that this nurse wont take his hot pocket to the break room and warm it. Pt has been sitting in the lobby with his hands in his pants the entire time this nurse has been out here. This nurse let patient know that I would bring him a sandwhich and he did not want that. He continue to stand at the desk and yell at this nurse that he has not had dialysis in 4 days and we aren't taking him seriously. Pt vital signs recently taken and they currently align with what they have been since arriving. Pt told that yelling would not get him to the back faster. Security sitting at their desk.

## 2024-04-27 NOTE — ED Notes (Signed)
 Pt given turkey sandwich. Pt very angry and yelling about that he is a medicare patient and pays for better food.

## 2024-04-28 ENCOUNTER — Encounter (HOSPITAL_COMMUNITY): Payer: Self-pay | Admitting: Emergency Medicine

## 2024-04-28 ENCOUNTER — Other Ambulatory Visit: Payer: Self-pay

## 2024-04-28 ENCOUNTER — Emergency Department (HOSPITAL_COMMUNITY)
Admission: EM | Admit: 2024-04-28 | Discharge: 2024-04-29 | Disposition: A | Discharge status: Home / Self Care | Payer: MEDICAID | Attending: Emergency Medicine | Admitting: Emergency Medicine

## 2024-04-28 DIAGNOSIS — H538 Other visual disturbances: Secondary | ICD-10-CM | POA: Diagnosis not present

## 2024-04-28 DIAGNOSIS — N186 End stage renal disease: Secondary | ICD-10-CM | POA: Insufficient documentation

## 2024-04-28 DIAGNOSIS — E875 Hyperkalemia: Secondary | ICD-10-CM | POA: Insufficient documentation

## 2024-04-28 DIAGNOSIS — Z79899 Other long term (current) drug therapy: Secondary | ICD-10-CM | POA: Diagnosis not present

## 2024-04-28 DIAGNOSIS — Z992 Dependence on renal dialysis: Secondary | ICD-10-CM | POA: Insufficient documentation

## 2024-04-28 DIAGNOSIS — R42 Dizziness and giddiness: Secondary | ICD-10-CM | POA: Diagnosis present

## 2024-04-28 DIAGNOSIS — R509 Fever, unspecified: Secondary | ICD-10-CM | POA: Diagnosis not present

## 2024-04-28 DIAGNOSIS — R059 Cough, unspecified: Secondary | ICD-10-CM | POA: Diagnosis not present

## 2024-04-28 LAB — COMPREHENSIVE METABOLIC PANEL WITH GFR
ALT: 15 U/L (ref 0–44)
AST: 15 U/L (ref 15–41)
Albumin: 3.4 g/dL — ABNORMAL LOW (ref 3.5–5.0)
Alkaline Phosphatase: 73 U/L (ref 38–126)
Anion gap: 15 (ref 5–15)
BUN: 78 mg/dL — ABNORMAL HIGH (ref 6–20)
CO2: 18 mmol/L — ABNORMAL LOW (ref 22–32)
Calcium: 8.4 mg/dL — ABNORMAL LOW (ref 8.9–10.3)
Chloride: 108 mmol/L (ref 98–111)
Creatinine, Ser: 13.95 mg/dL — ABNORMAL HIGH (ref 0.61–1.24)
GFR, Estimated: 4 mL/min — ABNORMAL LOW (ref >60)
Glucose, Bld: 98 mg/dL (ref 70–99)
Potassium: 5.3 mmol/L — ABNORMAL HIGH (ref 3.5–5.1)
Sodium: 141 mmol/L (ref 135–145)
Total Bilirubin: 0.9 mg/dL (ref 0.0–1.2)
Total Protein: 6.5 g/dL (ref 6.5–8.1)

## 2024-04-28 LAB — CBC WITH DIFFERENTIAL/PLATELET
Abs Immature Granulocytes: 0.02 K/uL (ref 0.00–0.07)
Basophils Absolute: 0.1 K/uL (ref 0.0–0.1)
Basophils Relative: 1 %
Eosinophils Absolute: 0.2 K/uL (ref 0.0–0.5)
Eosinophils Relative: 3 %
HCT: 29.2 % — ABNORMAL LOW (ref 39.0–52.0)
Hemoglobin: 9.3 g/dL — ABNORMAL LOW (ref 13.0–17.0)
Immature Granulocytes: 0 %
Lymphocytes Relative: 20 %
Lymphs Abs: 1.6 K/uL (ref 0.7–4.0)
MCH: 30.4 pg (ref 26.0–34.0)
MCHC: 31.8 g/dL (ref 30.0–36.0)
MCV: 95.4 fL (ref 80.0–100.0)
Monocytes Absolute: 0.5 K/uL (ref 0.1–1.0)
Monocytes Relative: 7 %
Neutro Abs: 5.4 K/uL (ref 1.7–7.7)
Neutrophils Relative %: 69 %
Platelets: 224 K/uL (ref 150–400)
RBC: 3.06 MIL/uL — ABNORMAL LOW (ref 4.22–5.81)
RDW: 13.7 % (ref 11.5–15.5)
WBC: 7.8 K/uL (ref 4.0–10.5)
nRBC: 0 % (ref 0.0–0.2)

## 2024-04-28 NOTE — ED Notes (Signed)
 Pt refused to sign MSE waiver.

## 2024-04-28 NOTE — ED Triage Notes (Signed)
 Pt c/o body aches and missing dialysis since last week. Pt uncooperative in triage and not wanting to answer questions.

## 2024-04-29 ENCOUNTER — Emergency Department (HOSPITAL_COMMUNITY): Payer: MEDICAID

## 2024-04-29 ENCOUNTER — Emergency Department (HOSPITAL_COMMUNITY)
Admission: EM | Admit: 2024-04-29 | Discharge: 2024-04-30 | Disposition: A | Discharge status: Home / Self Care | Payer: MEDICAID | Attending: Emergency Medicine | Admitting: Emergency Medicine

## 2024-04-29 ENCOUNTER — Other Ambulatory Visit: Payer: Self-pay

## 2024-04-29 ENCOUNTER — Encounter (HOSPITAL_COMMUNITY): Payer: Self-pay | Admitting: Emergency Medicine

## 2024-04-29 DIAGNOSIS — Z992 Dependence on renal dialysis: Secondary | ICD-10-CM | POA: Insufficient documentation

## 2024-04-29 DIAGNOSIS — R42 Dizziness and giddiness: Secondary | ICD-10-CM | POA: Insufficient documentation

## 2024-04-29 DIAGNOSIS — R509 Fever, unspecified: Secondary | ICD-10-CM | POA: Insufficient documentation

## 2024-04-29 DIAGNOSIS — R059 Cough, unspecified: Secondary | ICD-10-CM | POA: Insufficient documentation

## 2024-04-29 DIAGNOSIS — H538 Other visual disturbances: Secondary | ICD-10-CM | POA: Insufficient documentation

## 2024-04-29 LAB — HEPATITIS B SURFACE ANTIGEN: Hepatitis B Surface Ag: NONREACTIVE

## 2024-04-29 MED ORDER — ALTEPLASE 2 MG IJ SOLR: 2.0000 mg | Freq: Once | INTRAMUSCULAR | Status: DC | PRN

## 2024-04-29 MED ORDER — LIDOCAINE-PRILOCAINE 2.5-2.5 % EX CREA: 1.0000 | TOPICAL_CREAM | CUTANEOUS | Status: DC | PRN

## 2024-04-29 MED ORDER — HEPARIN SODIUM (PORCINE) 1000 UNIT/ML DIALYSIS: 1500.0000 [IU] | INTRAMUSCULAR | Status: DC | PRN

## 2024-04-29 MED ORDER — HEPARIN SODIUM (PORCINE) 1000 UNIT/ML DIALYSIS
3000.0000 [IU] | Freq: Once | INTRAMUSCULAR | Status: DC
  Filled 2024-04-29: qty 3

## 2024-04-29 MED ORDER — LIDOCAINE HCL (PF) 1 % IJ SOLN: 5.0000 mL | INTRAMUSCULAR | Status: DC | PRN

## 2024-04-29 MED ORDER — ANTICOAGULANT SODIUM CITRATE 4% (200MG/5ML) IV SOLN: 5.0000 mL | Status: DC | PRN

## 2024-04-29 MED ORDER — HEPARIN SODIUM (PORCINE) 1000 UNIT/ML DIALYSIS
1000.0000 [IU] | INTRAMUSCULAR | Status: DC | PRN
Start: 2024-04-29 — End: 2024-04-29

## 2024-04-29 MED ORDER — PENTAFLUOROPROP-TETRAFLUOROETH EX AERO: 1.0000 | INHALATION_SPRAY | CUTANEOUS | Status: DC | PRN

## 2024-04-29 MED ORDER — HEPARIN SODIUM (PORCINE) 1000 UNIT/ML DIALYSIS
40.0000 [IU]/kg | INTRAMUSCULAR | Status: DC | PRN
Start: 2024-04-29 — End: 2024-04-29

## 2024-04-29 MED ORDER — CHLORHEXIDINE GLUCONATE CLOTH 2 % EX PADS: 6.0000 | MEDICATED_PAD | Freq: Every day | CUTANEOUS | Status: DC

## 2024-04-29 MED ORDER — ACETAMINOPHEN 325 MG PO TABS
650.0000 mg | ORAL_TABLET | Freq: Once | ORAL | Status: AC
  Administered 2024-04-29: 650 mg via ORAL
  Filled 2024-04-29: qty 2

## 2024-04-29 MED ORDER — HEPARIN SODIUM (PORCINE) 1000 UNIT/ML DIALYSIS: 1000.0000 [IU] | INTRAMUSCULAR | Status: DC | PRN

## 2024-04-29 NOTE — ED Provider Notes (Addendum)
 [place] EMERGENCY DEPARTMENT AT [facility] Provider Note   CSN: [id number] Arrival date & time: 04/28/24  2045     Patient presents with: Generalized Body Aches   Scot E Colclough is a 38 y.o. male with history of ESRD on HD Tuesday Thursday Saturday presents to ED complaining of bodyaches and needing dialysis.  Has not dialyzed since 04/19/2024, 10 days ago.  Here complaining of pain all over her body but denies any chest pain or shortness of breath.  Denies leg swelling.  Reports he still makes urine.  States he cannot make it to dialysis because people will  not give me a ride.  Reports that he has been homeless for 1 year.  States that he has been getting to dialysis because people give him money but reports that now people are not giving me money and he cannot make it to dialysis.  He was seen here last night, scheduled for dialysis but unfortunately left AGAINST MEDICAL ADVICE because he was upset about the food he was being provided with.  HPI     Prior to Admission medications   Medication Sig Start Date End Date Taking? Authorizing Provider  acetaminophen  (TYLENOL ) 500 MG tablet Take 500 mg by mouth every 6 (six) hours as needed for moderate pain.    [provider]  albuterol  (VENTOLIN  HFA) 108 (90 Base) MCG/ACT inhaler Inhale 2 puffs into the lungs every 4 (four) hours as needed for wheezing or shortness of breath. 10/23/23   Paseda, Folashade R, FNP  amLODipine  (NORVASC ) 5 MG tablet Take 1 tablet (5 mg total) by mouth daily. 04/14/24   Paseda, Folashade R, FNP  benztropine  (COGENTIN ) 1 MG tablet Take 1 mg by mouth 2 (two) times daily.    [provider]  cinacalcet  (SENSIPAR ) 30 MG tablet Take 1 tablet (30 mg total) by mouth every evening. 11/16/23   Paseda, Folashade R, FNP  citalopram  (CELEXA ) 20 MG tablet Take 1 tablet (20 mg total) by mouth every morning. 11/16/23   Paseda, Folashade R, FNP  cyclobenzaprine  (FLEXERIL ) 5 MG tablet Take 1  tablet (5 mg total) by mouth 3 (three) times daily as needed. 10/23/23   Paseda, Folashade R, FNP  fluticasone  (FLONASE ) 50 MCG/ACT nasal spray Place 2 sprays into both nostrils daily. Patient not taking: Reported on 04/14/2024 10/23/23   Paseda, Folashade R, FNP  ibuprofen  (ADVIL ) 600 MG tablet Take 1 tablet (600 mg total) by mouth every 8 (eight) hours as needed (severe pain 7-10). 10/23/23   Paseda, Folashade R, FNP  paliperidone Palmitate ER (INVEGA TRINZA) 819 MG/2.63ML injection Inject 819 mg into the muscle every 3 (three) months. Patient not taking: Reported on 04/14/2024    [provider]  sevelamer carbonate (RENVELA) 800 MG tablet Take 2,400 mg by mouth 3 (three) times daily with meals.    [provider]  valbenazine  (INGREZZA ) 80 MG capsule Samples of this drug were given to the patient, quantity 1, Lot Number [id number] 11/27/22   Tat, Asberry RAMAN, DO    Allergies: Aspirin and Ibuprofen     Review of Systems  All other systems reviewed and are negative.   Updated Vital Signs BP (!) 150/88 (BP Location: Right Arm)   Pulse 78   Temp 97.6 F (36.4 C) (Oral)   Resp 17   SpO2 100%   Physical Exam Vitals and nursing note reviewed.  Constitutional:      General: He is not in acute distress.    Appearance: He  is well-developed.  HENT:     Head: Normocephalic and atraumatic.  Eyes:     Conjunctiva/sclera: Conjunctivae normal.  Cardiovascular:     Rate and Rhythm: Normal rate and regular rhythm.     Heart sounds: No murmur heard. Pulmonary:     Effort: Pulmonary effort is normal. No respiratory distress.     Breath sounds: Normal breath sounds.  Abdominal:     Palpations: Abdomen is soft.     Tenderness: There is no abdominal tenderness.  Musculoskeletal:        General: No swelling.     Cervical back: Neck supple.     Right lower leg: No edema.     Left lower leg: No edema.  Skin:    General: Skin is warm and dry.     Capillary Refill: Capillary refill  takes less than 2 seconds.  Neurological:     Mental Status: He is alert and oriented to person, place, and time. Mental status is at baseline.  Psychiatric:        Mood and Affect: Mood normal.     (all labs ordered are listed, but only abnormal results are displayed) Labs Reviewed  CBC WITH DIFFERENTIAL/PLATELET - Abnormal; Notable for the following components:      Result Value   RBC 3.06 (*)    Hemoglobin 9.3 (*)    HCT 29.2 (*)    All other components within normal limits  COMPREHENSIVE METABOLIC PANEL WITH GFR - Abnormal; Notable for the following components:   Potassium 5.3 (*)    CO2 18 (*)    BUN 78 (*)    Creatinine, Ser 13.95 (*)    Calcium 8.4 (*)    Albumin 3.4 (*)    GFR, Estimated 4 (*)    All other components within normal limits    EKG: EKG Interpretation Date/Time:  Tuesday April 29 2024 03:25:30 EST Ventricular Rate:  80 PR Interval:  134 QRS Duration:  94 QT Interval:  440 QTC Calculation: 508 R Axis:   89  Text Interpretation: Sinus rhythm Consider left ventricular hypertrophy Prolonged QT interval Confirmed by Griselda Norris [phone]) on 04/29/2024 3:29:38 AM  Radiology: DG Chest Portable 1 View Result Date: 04/29/2024 EXAM: 1 VIEW(S) XRAY OF THE CHEST 04/29/2024 03:13:00 AM COMPARISON: 04/26/2024 CLINICAL HISTORY: assess for effusions, dialysis patient FINDINGS: LUNGS AND PLEURA: No focal pulmonary opacity. No pleural effusion. No pneumothorax. HEART AND MEDIASTINUM: No acute abnormality of the cardiac and mediastinal silhouettes. BONES AND SOFT TISSUES: No acute osseous abnormality. IMPRESSION: 1. No acute cardiopulmonary disease. Electronically signed by: Franky Crease MD 04/29/2024 03:18 AM EST RP Workstation: [id number]    Procedures   Medications Ordered in the ED - No data to display   Medical Decision Making Amount and/or Complexity of Data Reviewed Labs: ordered. Radiology: ordered. ECG/medicine tests: ordered.   38 year old male  presents for evaluation.  On exam, slightly hypertensive at 150/88.  Afebrile and nontachycardic.  Lung sounds clear bilaterally, no hypoxia.  Abdomen is soft and compressible.  Neurological examination is at baseline.  Overall the patient is nontoxic in appearance with reassuring vital signs.  Chart reviewed.  Patient was seen last night, scheduled for dialysis however unfortunately left before HD was completed.  He had labs drawn tonight which shows CBC without leukocytosis and a baseline hemoglobin.  Metabolic panel reveals potassium 5.3 without EKG changes, BUN 78, creatinine 13.95, GFR 4 anion gap 15.  Chest x-ray collected which shows no effusions or consolidations.  Patient  reports no social support, does not have a ride to dialysis, is homeless.  For this reason consulted with nephrology, requested HD, spoke with nephrology. Dr. Norine of nephrology states patient will be dialyzed at some point today, return to ED for discharge.  Patient has been made aware of plan.  The patient reports that he is willing to stay in the department for dialysis.  Patient is stable at this time for dialysis.    Final diagnoses:  Need for acute hemodialysis    ED Discharge Orders     None             Ruthell Lonni JULIANNA DEVONNA 04/29/24 [id number]    Griselda Norris, MD 04/29/24 [phone]

## 2024-04-29 NOTE — ED Notes (Signed)
 Pt resting comfortably appearing to be asleep. Equal rise and fall of chest noted. No acute distress. This pt refused all monitoring from the previous RN.

## 2024-04-29 NOTE — Progress Notes (Signed)
   04/29/24 1514  Vitals  Temp 98.3 F (36.8 C)  BP (!) 186/85  O2 Device Room Air  Weight  (Unable to obtain)  Type of Weight Post-Dialysis  Oxygen Therapy  Patient Activity (if Appropriate) In bed  Pulse Oximetry Type Continuous  Oximetry Probe Site Changed No  Post Treatment  Dialyzer Clearance Lightly streaked  Liters Processed 32.5  Fluid Removed (mL) 1200 mL  Tolerated HD Treatment Yes  Post-Hemodialysis Comments Ended tx already. Pt. starting to get aggressive  AVG/AVF Arterial Site Held (minutes) 5 minutes  AVG/AVF Venous Site Held (minutes) 5 minutes

## 2024-04-29 NOTE — ED Notes (Signed)
 Pt ambulatory to bathroom

## 2024-04-29 NOTE — Progress Notes (Signed)
 Pt. Came in via stretcher, assisted by porter.  Consent signed and on file. Started with no complaints   UF goal: Tx duration: 1.33hours  Access used: Left AVF Access issue: None  Pt. Is becoming aggressive. Requesting to terminate tx. Pt. Signed AMA.  Pt. Ended tx early. Pressure dressing applied. Endorsed to floor nurse. Transported pt. To ED  Estanislao Morley B. Demetreus Lothamer, RN [facility]

## 2024-04-29 NOTE — ED Notes (Signed)
 Pt is being non-compliant and uncooperative/ pt keeps asking for coffee and to be left alone/ this nurse told pt he has to be seen by provider first

## 2024-04-29 NOTE — Procedures (Signed)
 I was present at this dialysis session. I have reviewed the session itself and made appropriate changes. Pt missed HD here for makeup and will go back to ED for discharge.    Vital signs in last 24 hours:  Temp:  [97.6 F (36.4 C)-98.5 F (36.9 C)] 98.5 F (36.9 C) (11/25 1315) Pulse Rate:  [62-89] 75 (11/25 1400) Resp:  [14-25] 25 (11/25 1400) BP: (143-178)/(84-114) 178/85 (11/25 1430) SpO2:  [97 %-100 %] 98 % (11/25 1329) Weight change:  There were no vitals filed for this visit.  Recent Labs  Lab 04/28/24 2109  NA 141  K 5.3*  CL 108  CO2 18*  GLUCOSE 98  BUN 78*  CREATININE 13.95*  CALCIUM 8.4*    Recent Labs  Lab 04/24/24 1527 04/26/24 2219 04/28/24 2109  WBC 7.5 7.3 7.8  NEUTROABS 5.1 4.5 5.4  HGB 9.5* 9.1* 9.3*  HCT 30.4* 28.5* 29.2*  MCV 96.2 96.0 95.4  PLT 256 251 224    Scheduled Meds:  Chlorhexidine  Gluconate Cloth  6 each Topical Q0600   heparin   3,000 Units Dialysis Once in dialysis   heparin   3,000 Units Dialysis Once in dialysis   Continuous Infusions:  anticoagulant sodium citrate      anticoagulant sodium citrate      anticoagulant sodium citrate      anticoagulant sodium citrate      PRN Meds:.alteplase , alteplase , alteplase , alteplase , anticoagulant sodium citrate , anticoagulant sodium citrate , anticoagulant sodium citrate , anticoagulant sodium citrate , heparin , heparin , heparin , heparin , heparin , heparin , lidocaine  (PF), lidocaine  (PF), lidocaine  (PF), lidocaine  (PF), lidocaine -prilocaine , lidocaine -prilocaine , lidocaine -prilocaine , lidocaine -prilocaine , pentafluoroprop-tetrafluoroeth, pentafluoroprop-tetrafluoroeth, pentafluoroprop-tetrafluoroeth, pentafluoroprop-tetrafluoroeth   William DELENA Sellar,  MD 04/29/2024, 2:37 PM

## 2024-04-29 NOTE — ED Notes (Addendum)
 Pt ambulatory to waiting room. Pt verbalized understanding of discharge instructions. Pt did not wait for d/c vitals.

## 2024-04-29 NOTE — Progress Notes (Signed)
 Contacted [facility] to be advised pt came to ED and had HD treatment today. Clinic was advised pt was d/c from ED after HD.   Randine Mungo Dialysis Navigator [phone]

## 2024-04-29 NOTE — Discharge Instructions (Signed)
 Please follow-up with your dialysis center, return to the emergency department if you have new shortness of breath, nausea, vomiting, or are unable to make your hemodialysis in the future.

## 2024-04-29 NOTE — ED Triage Notes (Signed)
 Pt bib ems from home with c/o dizziness that started tonight, headache x 3-4 days, blurry vision, and hypertension. Last HD on 11/25

## 2024-04-30 ENCOUNTER — Emergency Department (HOSPITAL_COMMUNITY): Payer: MEDICAID

## 2024-04-30 LAB — CBC WITH DIFFERENTIAL/PLATELET
Abs Immature Granulocytes: 0.02 K/uL (ref 0.00–0.07)
Basophils Absolute: 0 K/uL (ref 0.0–0.1)
Basophils Relative: 1 %
Eosinophils Absolute: 0.2 K/uL (ref 0.0–0.5)
Eosinophils Relative: 3 %
HCT: 29 % — ABNORMAL LOW (ref 39.0–52.0)
Hemoglobin: 9.3 g/dL — ABNORMAL LOW (ref 13.0–17.0)
Immature Granulocytes: 0 %
Lymphocytes Relative: 21 %
Lymphs Abs: 1.3 K/uL (ref 0.7–4.0)
MCH: 30.4 pg (ref 26.0–34.0)
MCHC: 32.1 g/dL (ref 30.0–36.0)
MCV: 94.8 fL (ref 80.0–100.0)
Monocytes Absolute: 0.5 K/uL (ref 0.1–1.0)
Monocytes Relative: 8 %
Neutro Abs: 4 K/uL (ref 1.7–7.7)
Neutrophils Relative %: 67 %
Platelets: 224 K/uL (ref 150–400)
RBC: 3.06 MIL/uL — ABNORMAL LOW (ref 4.22–5.81)
RDW: 13.8 % (ref 11.5–15.5)
WBC: 6 K/uL (ref 4.0–10.5)
nRBC: 0 % (ref 0.0–0.2)

## 2024-04-30 LAB — RESP PANEL BY RT-PCR (RSV, FLU A&B, COVID)  RVPGX2
Influenza A by PCR: NEGATIVE
Influenza B by PCR: NEGATIVE
Resp Syncytial Virus by PCR: NEGATIVE
SARS Coronavirus 2 by RT PCR: NEGATIVE

## 2024-04-30 LAB — I-STAT CG4 LACTIC ACID, ED: Lactic Acid, Venous: 0.9 mmol/L (ref 0.5–1.9)

## 2024-04-30 LAB — COMPREHENSIVE METABOLIC PANEL WITH GFR
ALT: 13 U/L (ref 0–44)
AST: 15 U/L (ref 15–41)
Albumin: 3.2 g/dL — ABNORMAL LOW (ref 3.5–5.0)
Alkaline Phosphatase: 73 U/L (ref 38–126)
Anion gap: 11 (ref 5–15)
BUN: 60 mg/dL — ABNORMAL HIGH (ref 6–20)
CO2: 22 mmol/L (ref 22–32)
Calcium: 8.2 mg/dL — ABNORMAL LOW (ref 8.9–10.3)
Chloride: 108 mmol/L (ref 98–111)
Creatinine, Ser: 11.59 mg/dL — ABNORMAL HIGH (ref 0.61–1.24)
GFR, Estimated: 5 mL/min — ABNORMAL LOW (ref >60)
Glucose, Bld: 102 mg/dL — ABNORMAL HIGH (ref 70–99)
Potassium: 3.9 mmol/L (ref 3.5–5.1)
Sodium: 141 mmol/L (ref 135–145)
Total Bilirubin: 0.7 mg/dL (ref 0.0–1.2)
Total Protein: 6.1 g/dL — ABNORMAL LOW (ref 6.5–8.1)

## 2024-04-30 LAB — HEPATITIS B SURFACE ANTIBODY, QUANTITATIVE: Hep B S AB Quant (Post): 133 m[IU]/mL

## 2024-04-30 MED ORDER — AMLODIPINE BESYLATE 5 MG PO TABS
5.0000 mg | ORAL_TABLET | Freq: Every day | ORAL | Status: DC
  Administered 2024-04-30: 5 mg via ORAL
  Filled 2024-04-30: qty 1

## 2024-04-30 MED ORDER — MECLIZINE HCL 25 MG PO TABS: 25.0000 mg | ORAL_TABLET | Freq: Three times a day (TID) | ORAL | 0 refills | Status: AC | PRN

## 2024-04-30 MED ORDER — AMLODIPINE BESYLATE 5 MG PO TABS: 5.0000 mg | ORAL_TABLET | Freq: Every day | ORAL | Status: DC

## 2024-04-30 MED ORDER — ACETAMINOPHEN 325 MG PO TABS
650.0000 mg | ORAL_TABLET | Freq: Once | ORAL | Status: DC
  Filled 2024-04-30: qty 2

## 2024-04-30 NOTE — ED Provider Notes (Signed)
 [facility]Y DEPT [facility] Emergency Department Provider Note MRN:  [id number]  Arrival date & time: 04/30/24     Chief Complaint   Dizziness   History of Present Illness   William Gregory is a 38 y.o. year-old male presents to the ED with chief complaint of dizziness.  States that he has had dizziness for the past 3 to 4 days.  Reports some associated blurry vision.  He also reports cough and a subjective fever.  He denies any dysuria or hematuria.  States that he does make urine.  States that his last dialysis was today.  He has been seen several times over the past few days for various complaints.  He denies any nausea or vomiting.  Denies any diarrhea or constipation.  Denies any other new symptoms tonight..  History provided by patient.   Review of Systems  Pertinent positive and negative review of systems noted in HPI.    Physical Exam   Vitals:   04/30/24 0110 04/30/24 0130  BP: (!) 194/89 (!) 194/89  Pulse: 75   Resp: 20   Temp:    SpO2: 100%     CONSTITUTIONAL:  non toxic-appearing, NAD NEURO:  Alert and oriented x 3, CN 3-12 grossly intact EYES:  eyes equal and reactive ENT/NECK:  Supple, no stridor  CARDIO:  normal rate, regular rhythm, appears well-perfused  PULM:  No respiratory distress, CTAB GI/GU:  non-distended, no focal tenderness MSK/SPINE:  No gross deformities, no edema, moves all extremities  SKIN:  no rash, , atraumatic   *Additional and/or pertinent findings included in MDM below  Diagnostic and Interventional Summary    EKG Interpretation Date/Time:  Wednesday April 30 2024 00:11:04 EST Ventricular Rate:  74 PR Interval:  134 QRS Duration:  96 QT Interval:  378 QTC Calculation: 420 R Axis:   88  Text Interpretation: Sinus rhythm Probable left ventricular hypertrophy Repol abnrm suggests ischemia, inferior leads No significant change since last tracing Confirmed by Emil Share [phone]) on 04/30/2024 1:07:24 AM        Labs Reviewed  COMPREHENSIVE METABOLIC PANEL WITH GFR - Abnormal; Notable for the following components:      Result Value   Glucose, Bld 102 (*)    BUN 60 (*)    Creatinine, Ser 11.59 (*)    Calcium 8.2 (*)    Total Protein 6.1 (*)    Albumin 3.2 (*)    GFR, Estimated 5 (*)    All other components within normal limits  CBC WITH DIFFERENTIAL/PLATELET - Abnormal; Notable for the following components:   RBC 3.06 (*)    Hemoglobin 9.3 (*)    HCT 29.0 (*)    All other components within normal limits  RESP PANEL BY RT-PCR (RSV, FLU A&B, COVID)  RVPGX2  URINALYSIS, W/ REFLEX TO CULTURE (INFECTION SUSPECTED)  I-STAT CG4 LACTIC ACID, ED    DG Chest Port 1 View  Final Result    CT HEAD WO CONTRAST ( )  Final Result      Medications  amLODipine  (NORVASC ) tablet 5 mg (5 mg Oral Given 04/30/24 0130)     Procedures  /  Critical Care Procedures  ED Course and Medical Decision Making  I have reviewed the triage vital signs, the nursing notes, and pertinent available records from the EMR.  Social Determinants Affecting Complexity of Care: Patient has no clinically significant social determinants affecting this chief complaint..   ED Course:    Medical Decision Making Patient here with dizziness that  started few days ago.  He has had some blurry vision as well.  States that he was last dialyzed today.  States that he has had some cough, but denies fever or chills.  Chest x-ray today is unremarkable, no evidence of pneumonia.  No marked leukocytosis, normal lactic acid.  Patient refused COVID and flu swabs.  Refused urinalysis in the sense that RN reported he used the urinal, threw it on the ground, and then spit on it.  He is noted to be hypertensive, but is not in any distress.  I will treat him with his home dose of amlodipine .    CT scan is reassuring.    Feel that he can be safely discharged with outpatient follow-up.  I have advised him to continue taking his blood  pressure medications.  He is advised to return for new or worsening symptoms.  Amount and/or Complexity of Data Reviewed Labs: ordered. Radiology: ordered. ECG/medicine tests: ordered.  Risk Prescription drug management.         Consultants: No consultations were needed in caring for this patient.   Treatment and Plan: I considered admission due to patient's initial presentation, but after considering the examination and diagnostic results, patient will not require admission and can be discharged with outpatient follow-up.    Final Clinical Impressions(s) / ED Diagnoses     ICD-10-CM   1. Dizziness  R42       ED Discharge Orders          Ordered    meclizine  (ANTIVERT ) 25 MG tablet  3 times daily PRN        04/30/24 0214              Discharge Instructions Discussed with and Provided to Patient:     Discharge Instructions      Your CT scan of the head showed no evidence of stroke.  The chest x-ray showed no evidence of pneumonia.  Please continue taking all of your blood pressure medication.  Please follow-up with your primary care doctor.  Please return for new or worsening symptoms.  Take medications as prescribed.       Vicky Charleston, PA-C 04/30/24 0218    Emil Share, DO 04/30/24 [id number]

## 2024-04-30 NOTE — ED Notes (Signed)
 Pt urinated in urinal then threw urinal on the floor

## 2024-04-30 NOTE — ED Notes (Signed)
 Pt ambulated from the ED. Pt stated I know you all have been going threw my stuff

## 2024-04-30 NOTE — Discharge Instructions (Addendum)
 Your CT scan of the head showed no evidence of stroke.  The chest x-ray showed no evidence of pneumonia.  Please continue taking all of your blood pressure medication.  Please follow-up with your primary care doctor.  Please return for new or worsening symptoms.  Take medications as prescribed.

## 2024-04-30 NOTE — ED Notes (Signed)
 Pt refused respiratory swab and states he had one yesterday, MD notified.

## 2024-05-05 ENCOUNTER — Encounter (HOSPITAL_COMMUNITY): Payer: Self-pay

## 2024-05-05 ENCOUNTER — Emergency Department (HOSPITAL_COMMUNITY)
Admission: EM | Admit: 2024-05-05 | Discharge: 2024-05-06 | Disposition: A | Payer: MEDICAID | Attending: Emergency Medicine | Admitting: Emergency Medicine

## 2024-05-05 ENCOUNTER — Other Ambulatory Visit: Payer: Self-pay

## 2024-05-05 DIAGNOSIS — N186 End stage renal disease: Secondary | ICD-10-CM | POA: Diagnosis not present

## 2024-05-05 DIAGNOSIS — Z5900 Homelessness unspecified: Secondary | ICD-10-CM | POA: Insufficient documentation

## 2024-05-05 DIAGNOSIS — R109 Unspecified abdominal pain: Secondary | ICD-10-CM | POA: Diagnosis present

## 2024-05-05 DIAGNOSIS — Z992 Dependence on renal dialysis: Secondary | ICD-10-CM | POA: Insufficient documentation

## 2024-05-05 DIAGNOSIS — R1084 Generalized abdominal pain: Secondary | ICD-10-CM | POA: Insufficient documentation

## 2024-05-05 LAB — CBC WITH DIFFERENTIAL/PLATELET
Abs Immature Granulocytes: 0.03 K/uL (ref 0.00–0.07)
Basophils Absolute: 0 K/uL (ref 0.0–0.1)
Basophils Relative: 0 %
Eosinophils Absolute: 0.1 K/uL (ref 0.0–0.5)
Eosinophils Relative: 1 %
HCT: 30 % — ABNORMAL LOW (ref 39.0–52.0)
Hemoglobin: 9.5 g/dL — ABNORMAL LOW (ref 13.0–17.0)
Immature Granulocytes: 0 %
Lymphocytes Relative: 9 %
Lymphs Abs: 0.8 K/uL (ref 0.7–4.0)
MCH: 30.2 pg (ref 26.0–34.0)
MCHC: 31.7 g/dL (ref 30.0–36.0)
MCV: 95.2 fL (ref 80.0–100.0)
Monocytes Absolute: 0.5 K/uL (ref 0.1–1.0)
Monocytes Relative: 6 %
Neutro Abs: 7.5 K/uL (ref 1.7–7.7)
Neutrophils Relative %: 84 %
Platelets: 243 K/uL (ref 150–400)
RBC: 3.15 MIL/uL — ABNORMAL LOW (ref 4.22–5.81)
RDW: 13.6 % (ref 11.5–15.5)
WBC: 9 K/uL (ref 4.0–10.5)
nRBC: 0 % (ref 0.0–0.2)

## 2024-05-05 LAB — LIPASE, BLOOD: Lipase: 46 U/L (ref 11–51)

## 2024-05-05 LAB — COMPREHENSIVE METABOLIC PANEL WITH GFR
ALT: 13 U/L (ref 0–44)
AST: 24 U/L (ref 15–41)
Albumin: 3.5 g/dL (ref 3.5–5.0)
Alkaline Phosphatase: 76 U/L (ref 38–126)
Anion gap: 13 (ref 5–15)
BUN: 63 mg/dL — ABNORMAL HIGH (ref 6–20)
CO2: 22 mmol/L (ref 22–32)
Calcium: 8.3 mg/dL — ABNORMAL LOW (ref 8.9–10.3)
Chloride: 107 mmol/L (ref 98–111)
Creatinine, Ser: 13.07 mg/dL — ABNORMAL HIGH (ref 0.61–1.24)
GFR, Estimated: 4 mL/min — ABNORMAL LOW (ref >60)
Glucose, Bld: 69 mg/dL — ABNORMAL LOW (ref 70–99)
Potassium: 4 mmol/L (ref 3.5–5.1)
Sodium: 142 mmol/L (ref 135–145)
Total Bilirubin: 0.5 mg/dL (ref 0.0–1.2)
Total Protein: 6.2 g/dL — ABNORMAL LOW (ref 6.5–8.1)

## 2024-05-05 NOTE — ED Triage Notes (Signed)
 Pt was BIB [facility] from his moms house with a chief complaint of abdominal pain. The pts mom spoke to him earlier in the day and stated he was acting normal. Later he called his mom back and she thought he was slurring his words. Pt went to moms hoouse and before doing so he smoked some marijuana per ems and was acting strange at moms house. Pt is currently complaining of abdominal pain. Distention is present. And it is tender upon palpation.   Patient is alert and oriented to self and city. He is unaware of the current year per EMS.   160/100 147 CBG 16 RR 96% RA

## 2024-05-05 NOTE — ED Provider Notes (Incomplete)
 [facility] EMERGENCY DEPARTMENT AT [facility] Provider Note   CSN: [id number] Arrival date & time: 05/05/24  2100     Patient presents with: No chief complaint on file.   William Gregory is a 38 y.o. male.  {Add pertinent medical, surgical, social history, OB history to HPI:32947} HPI     Prior to Admission medications   Medication Sig Start Date End Date Taking? Authorizing Provider  acetaminophen  (TYLENOL ) 500 MG tablet Take 500 mg by mouth every 6 (six) hours as needed for moderate pain.    [provider]  albuterol  (VENTOLIN  HFA) 108 (90 Base) MCG/ACT inhaler Inhale 2 puffs into the lungs every 4 (four) hours as needed for wheezing or shortness of breath. 10/23/23   Paseda, Folashade R, FNP  amLODipine  (NORVASC ) 5 MG tablet Take 1 tablet (5 mg total) by mouth daily. 04/14/24   Paseda, Folashade R, FNP  benztropine  (COGENTIN ) 1 MG tablet Take 1 mg by mouth 2 (two) times daily.    [provider]  cinacalcet  (SENSIPAR ) 30 MG tablet Take 1 tablet (30 mg total) by mouth every evening. 11/16/23   Paseda, Folashade R, FNP  citalopram  (CELEXA ) 20 MG tablet Take 1 tablet (20 mg total) by mouth every morning. 11/16/23   Paseda, Folashade R, FNP  cyclobenzaprine  (FLEXERIL ) 5 MG tablet Take 1 tablet (5 mg total) by mouth 3 (three) times daily as needed. 10/23/23   Paseda, Folashade R, FNP  fluticasone  (FLONASE ) 50 MCG/ACT nasal spray Place 2 sprays into both nostrils daily. Patient not taking: Reported on 04/14/2024 10/23/23   Paseda, Folashade R, FNP  ibuprofen  (ADVIL ) 600 MG tablet Take 1 tablet (600 mg total) by mouth every 8 (eight) hours as needed (severe pain 7-10). 10/23/23   Paseda, Folashade R, FNP  meclizine  (ANTIVERT ) 25 MG tablet Take 1 tablet (25 mg total) by mouth 3 (three) times daily as needed for dizziness. 04/30/24   Vicky Charleston, PA-C  paliperidone Palmitate ER (INVEGA TRINZA) 819 MG/2.63ML injection Inject 819 mg into the muscle every 3  (three) months. Patient not taking: Reported on 04/14/2024    [provider]  sevelamer carbonate (RENVELA) 800 MG tablet Take 2,400 mg by mouth 3 (three) times daily with meals.    [provider]  valbenazine  (INGREZZA ) 80 MG capsule Samples of this drug were given to the patient, quantity 1, Lot Number [id number] 11/27/22   Tat, Asberry RAMAN, DO    Allergies: Aspirin and Ibuprofen     Review of Systems  Updated Vital Signs BP (!) 147/90 (BP Location: Right Arm)   Pulse 78   Temp 98.3 F (36.8 C) (Oral)   Resp 19   Ht 5' 5 (1.651 m)   Wt 88 kg   SpO2 98%   BMI 32.28 kg/m   Physical Exam  (all labs ordered are listed, but only abnormal results are displayed) Labs Reviewed  CBC WITH DIFFERENTIAL/PLATELET - Abnormal; Notable for the following components:      Result Value   RBC 3.15 (*)    Hemoglobin 9.5 (*)    HCT 30.0 (*)    All other components within normal limits  COMPREHENSIVE METABOLIC PANEL WITH GFR - Abnormal; Notable for the following components:   Glucose, Bld 69 (*)    BUN 63 (*)    Creatinine, Ser 13.07 (*)    Calcium 8.3 (*)    Total Protein 6.2 (*)    GFR, Estimated 4 (*)    All other components within  normal limits  LIPASE, BLOOD    EKG: None  Radiology: No results found.  {Document cardiac monitor, telemetry assessment procedure when appropriate:32947} Procedures   Medications Ordered in the ED - No data to display    {Click here for ABCD2, HEART and other calculators REFRESH Note before signing:1}                              Medical Decision Making Amount and/or Complexity of Data Reviewed Labs: ordered.   ***  {Document critical care time when appropriate  Document review of labs and clinical decision tools ie CHADS2VASC2, etc  Document your independent review of radiology images and any outside records  Document your discussion with family members, caretakers and with consultants  Document social determinants of  health affecting pt's care  Document your decision making why or why not admission, treatments were needed:32947:::1}   Final diagnoses:  ESRD on hemodialysis [facility])  Generalized abdominal pain    ED Discharge Orders     None

## 2024-05-05 NOTE — ED Provider Notes (Signed)
  EMERGENCY DEPARTMENT AT [facility] Provider Note   CSN: [id number] Arrival date & time: 05/05/24  2100     Patient presents with: No chief complaint on file.   William Gregory is a 37 y.o. male.   Pt is a 38 yo male with pmhx significant for ESRD on HD, polysubstance abuse, GERD, and arthritis.  Pt presents to the ED today with abd pain.  It started earlier today.  Pt is unable to describe it.  He is a poor historian. He last went to dialysis on 11/29.         Prior to Admission medications   Medication Sig Start Date End Date Taking? Authorizing Provider  acetaminophen  (TYLENOL ) 500 MG tablet Take 500 mg by mouth every 6 (six) hours as needed for moderate pain.    [provider]  albuterol  (VENTOLIN  HFA) 108 (90 Base) MCG/ACT inhaler Inhale 2 puffs into the lungs every 4 (four) hours as needed for wheezing or shortness of breath. 10/23/23   Paseda, Folashade R, FNP  amLODipine  (NORVASC ) 5 MG tablet Take 1 tablet (5 mg total) by mouth daily. 04/14/24   Paseda, Folashade R, FNP  benztropine  (COGENTIN ) 1 MG tablet Take 1 mg by mouth 2 (two) times daily.    [provider]  cinacalcet  (SENSIPAR ) 30 MG tablet Take 1 tablet (30 mg total) by mouth every evening. 11/16/23   Paseda, Folashade R, FNP  citalopram  (CELEXA ) 20 MG tablet Take 1 tablet (20 mg total) by mouth every morning. 11/16/23   Paseda, Folashade R, FNP  cyclobenzaprine  (FLEXERIL ) 5 MG tablet Take 1 tablet (5 mg total) by mouth 3 (three) times daily as needed. 10/23/23   Paseda, Folashade R, FNP  fluticasone  (FLONASE ) 50 MCG/ACT nasal spray Place 2 sprays into both nostrils daily. Patient not taking: Reported on 04/14/2024 10/23/23   Paseda, Folashade R, FNP  ibuprofen  (ADVIL ) 600 MG tablet Take 1 tablet (600 mg total) by mouth every 8 (eight) hours as needed (severe pain 7-10). 10/23/23   Paseda, Folashade R, FNP  meclizine  (ANTIVERT ) 25 MG tablet Take 1 tablet (25 mg total) by mouth 3  (three) times daily as needed for dizziness. 04/30/24   Vicky Charleston, PA-C  paliperidone Palmitate ER (INVEGA TRINZA) 819 MG/2.63ML injection Inject 819 mg into the muscle every 3 (three) months. Patient not taking: Reported on 04/14/2024    [provider]  sevelamer carbonate (RENVELA) 800 MG tablet Take 2,400 mg by mouth 3 (three) times daily with meals.    [provider]  valbenazine  (INGREZZA ) 80 MG capsule Samples of this drug were given to the patient, quantity 1, Lot Number [id number] 11/27/22   Tat, Asberry RAMAN, DO    Allergies: Aspirin and Ibuprofen     Review of Systems  Gastrointestinal:  Positive for abdominal pain.  All other systems reviewed and are negative.   Updated Vital Signs BP (!) 147/90 (BP Location: Right Arm)   Pulse 78   Temp 98.3 F (36.8 C) (Oral)   Resp 19   Ht 5' 5 (1.651 m)   Wt 88 kg   SpO2 98%   BMI 32.28 kg/m   Physical Exam Vitals and nursing note reviewed.  Constitutional:      Appearance: Normal appearance.  HENT:     Head: Normocephalic and atraumatic.     Right Ear: External ear normal.     Left Ear: External ear normal.     Nose: Nose normal.  Mouth/Throat:     Mouth: Mucous membranes are moist.     Pharynx: Oropharynx is clear.  Eyes:     Extraocular Movements: Extraocular movements intact.     Conjunctiva/sclera: Conjunctivae normal.     Pupils: Pupils are equal, round, and reactive to light.  Cardiovascular:     Rate and Rhythm: Normal rate and regular rhythm.     Pulses: Normal pulses.     Heart sounds: Normal heart sounds.  Pulmonary:     Effort: Pulmonary effort is normal.     Breath sounds: Normal breath sounds.  Abdominal:     General: Abdomen is flat. Bowel sounds are normal.     Palpations: Abdomen is soft.  Musculoskeletal:        General: Normal range of motion.     Cervical back: Normal range of motion and neck supple.     Comments: + AVF in LUE  Skin:    General: Skin is warm.      Capillary Refill: Capillary refill takes less than 2 seconds.  Neurological:     General: No focal deficit present.     Mental Status: He is alert and oriented to person, place, and time.  Psychiatric:        Mood and Affect: Mood normal.        Behavior: Behavior normal.     (all labs ordered are listed, but only abnormal results are displayed) Labs Reviewed  CBC WITH DIFFERENTIAL/PLATELET - Abnormal; Notable for the following components:      Result Value   RBC 3.15 (*)    Hemoglobin 9.5 (*)    HCT 30.0 (*)    All other components within normal limits  COMPREHENSIVE METABOLIC PANEL WITH GFR - Abnormal; Notable for the following components:   Glucose, Bld 69 (*)    BUN 63 (*)    Creatinine, Ser 13.07 (*)    Calcium 8.3 (*)    Total Protein 6.2 (*)    GFR, Estimated 4 (*)    All other components within normal limits  LIPASE, BLOOD    EKG: None  Radiology: No results found.   Procedures   Medications Ordered in the ED - No data to display                                  Medical Decision Making Amount and/or Complexity of Data Reviewed Labs: ordered.   This patient presents to the ED for concern of abd pain, this involves an extensive number of treatment options, and is a complaint that carries with it a high risk of complications and morbidity.  The differential diagnosis includes appy, chole, diverticulitis, constipation   Co morbidities that complicate the patient evaluation  ESRD on HD, polysubstance abuse, GERD, and arthritis   Additional history obtained:  Additional history obtained from epic chart review External records from outside source obtained and reviewed including EMS report   Lab Tests:  I Ordered, and personally interpreted labs.  The pertinent results include:  cbc with hgb 9.5  (stable); cmp with cr 13 (stable)   Medicines ordered and prescription drug management:   I have reviewed the patients home medicines and have made  adjustments as needed   Test Considered:  ct   Problem List / ED Course:  Abd pain:  belly is soft.  Labs are nl.  Pt is afebrile.  He is feeling better after a sandwich.  Pt is stable for d/c.  Return if worse.  F/u with pcp.   Reevaluation:  After the interventions noted above, I reevaluated the patient and found that they have :improved   Social Determinants of Health:  homeless   Dispostion:  After consideration of the diagnostic results and the patients response to treatment, I feel that the patent would benefit from discharge with outpatient f/u.       Final diagnoses:  ESRD on hemodialysis [facility])  Generalized abdominal pain    ED Discharge Orders     None          Dean Clarity, MD 05/06/24 [phone]

## 2024-05-07 DIAGNOSIS — R059 Cough, unspecified: Secondary | ICD-10-CM | POA: Insufficient documentation

## 2024-05-07 DIAGNOSIS — Z5321 Procedure and treatment not carried out due to patient leaving prior to being seen by health care provider: Secondary | ICD-10-CM | POA: Insufficient documentation

## 2024-05-07 DIAGNOSIS — J029 Acute pharyngitis, unspecified: Secondary | ICD-10-CM | POA: Insufficient documentation

## 2024-05-07 DIAGNOSIS — Z5900 Homelessness unspecified: Secondary | ICD-10-CM | POA: Insufficient documentation

## 2024-05-07 NOTE — Telephone Encounter (Signed)
 Number in DPR is not connected. KH

## 2024-05-07 NOTE — ED Triage Notes (Signed)
 POV/ homeless/ c/o sore throat, cough x4 days/ pt states he is cold and requesting coffee at this time/

## 2024-05-08 ENCOUNTER — Emergency Department (HOSPITAL_COMMUNITY): Admission: EM | Admit: 2024-05-08 | Discharge: 2024-05-08 | Payer: MEDICAID

## 2024-05-08 ENCOUNTER — Encounter (HOSPITAL_COMMUNITY): Payer: Self-pay | Admitting: *Deleted

## 2024-05-08 ENCOUNTER — Other Ambulatory Visit: Payer: Self-pay

## 2024-05-08 NOTE — ED Triage Notes (Signed)
 The pt reports that he is a dialysis pt last dialyzed last week  fistula lt arm

## 2024-05-08 NOTE — ED Notes (Signed)
 Patient stated  I do not want my vitals done. I just want to be seen. This NT explained to patient that patients are constantly being moved, he just have to remain patient. Pt stated he was stepping out.

## 2024-05-11 ENCOUNTER — Emergency Department (HOSPITAL_COMMUNITY): Payer: MEDICAID

## 2024-05-11 ENCOUNTER — Observation Stay (HOSPITAL_COMMUNITY)
Admission: EM | Admit: 2024-05-11 | Discharge: 2024-05-12 | Disposition: A | Payer: MEDICAID | Attending: Emergency Medicine | Admitting: Emergency Medicine

## 2024-05-11 DIAGNOSIS — R079 Chest pain, unspecified: Principal | ICD-10-CM | POA: Diagnosis present

## 2024-05-11 NOTE — ED Provider Notes (Incomplete)
 [place] EMERGENCY DEPARTMENT AT [facility] Provider Note   CSN: [id number] Arrival date & time: 05/11/24  2305     Patient presents with: Chest Pain   William Gregory is a 38 y.o. male.  {Add pertinent medical, surgical, social history, OB history to HPI:32947}  Chest Pain      Prior to Admission medications   Medication Sig Start Date End Date Taking? Authorizing Provider  acetaminophen  (TYLENOL ) 500 MG tablet Take 500 mg by mouth every 6 (six) hours as needed for moderate pain.    [provider]  albuterol  (VENTOLIN  HFA) 108 (90 Base) MCG/ACT inhaler Inhale 2 puffs into the lungs every 4 (four) hours as needed for wheezing or shortness of breath. 10/23/23   Paseda, Folashade R, FNP  amLODipine  (NORVASC ) 5 MG tablet Take 1 tablet (5 mg total) by mouth daily. 04/14/24   Paseda, Folashade R, FNP  benztropine  (COGENTIN ) 1 MG tablet Take 1 mg by mouth 2 (two) times daily.    [provider]  cinacalcet  (SENSIPAR ) 30 MG tablet Take 1 tablet (30 mg total) by mouth every evening. 11/16/23   Paseda, Folashade R, FNP  citalopram  (CELEXA ) 20 MG tablet Take 1 tablet (20 mg total) by mouth every morning. 11/16/23   Paseda, Folashade R, FNP  cyclobenzaprine  (FLEXERIL ) 5 MG tablet Take 1 tablet (5 mg total) by mouth 3 (three) times daily as needed. 10/23/23   Paseda, Folashade R, FNP  fluticasone  (FLONASE ) 50 MCG/ACT nasal spray Place 2 sprays into both nostrils daily. Patient not taking: Reported on 04/14/2024 10/23/23   Paseda, Folashade R, FNP  ibuprofen  (ADVIL ) 600 MG tablet Take 1 tablet (600 mg total) by mouth every 8 (eight) hours as needed (severe pain 7-10). 10/23/23   Paseda, Folashade R, FNP  meclizine  (ANTIVERT ) 25 MG tablet Take 1 tablet (25 mg total) by mouth 3 (three) times daily as needed for dizziness. 04/30/24   Vicky Charleston, PA-C  paliperidone Palmitate ER (INVEGA TRINZA) 819 MG/2.63ML injection Inject 819 mg into the muscle every 3 (three)  months. Patient not taking: Reported on 04/14/2024    [provider]  sevelamer carbonate (RENVELA) 800 MG tablet Take 2,400 mg by mouth 3 (three) times daily with meals.    [provider]  valbenazine  (INGREZZA ) 80 MG capsule Samples of this drug were given to the patient, quantity 1, Lot Number [id number] 11/27/22   Tat, Asberry RAMAN, DO    Allergies: Aspirin and Ibuprofen     Review of Systems  Cardiovascular:  Positive for chest pain.    Updated Vital Signs BP (!) 153/95 (BP Location: Right Arm)   Pulse 98   Temp 98 F (36.7 C) (Oral)   Resp 15   SpO2 99%   Physical Exam  (all labs ordered are listed, but only abnormal results are displayed) Labs Reviewed  BASIC METABOLIC PANEL WITH GFR  CBC  I-STAT CHEM 8, ED  TROPONIN I (HIGH SENSITIVITY)    EKG: None  Radiology: DG Chest Port 1 View Result Date: 05/11/2024 EXAM: 1 VIEW XRAY OF THE CHEST 05/11/2024 11:25:00 PM COMPARISON: 04/30/2024 CLINICAL HISTORY: Chest pain FINDINGS: LUNGS AND PLEURA: Mild diffuse increased pulmonary interstitial opacity, similar to prior studies. No pleural effusion. No pneumothorax. HEART AND MEDIASTINUM: No acute abnormality of the cardiac and mediastinal silhouettes. BONES AND SOFT TISSUES: No acute osseous abnormality. IMPRESSION: 1. No acute findings. Electronically signed by: Morgane Naveau MD 05/11/2024 11:32 PM EST RP Workstation: [id number]    {Document cardiac  monitor, telemetry assessment procedure when appropriate:32947} Procedures   Medications Ordered in the ED - No data to display    {Click here for ABCD2, HEART and other calculators REFRESH Note before signing:1}                              Medical Decision Making Amount and/or Complexity of Data Reviewed Labs: ordered. Radiology: ordered.   ***  {Document critical care time when appropriate  Document review of labs and clinical decision tools ie CHADS2VASC2, etc  Document your independent review of  radiology images and any outside records  Document your discussion with family members, caretakers and with consultants  Document social determinants of health affecting pt's care  Document your decision making why or why not admission, treatments were needed:32947:::1}   Final diagnoses:  None    ED Discharge Orders     None

## 2024-05-11 NOTE — ED Provider Notes (Signed)
 [place] EMERGENCY DEPARTMENT AT [facility] Provider Note   CSN: [id number] Arrival date & time: 05/11/24  2305     Patient presents with: Chest Pain   William Gregory is a 38 y.o. male.  Patient with past medical history significant for alcohol abuse, tobacco abuse, MI, ESRD on hemodialysis Tuesday, Thursday, Saturday, schizophrenia, homelessness presents to the emergency department complaining initially of chest pain.  During my assessment he states he also feels tight and has pain in his bilateral legs.  His chest tightness has persisted for the past 3 to 4 days.  He denies nausea, vomiting, shortness of breath, abdominal pain, fever.  Patient was asked about his compliance with dialysis and the patient cannot remember if he went on Saturday or not.  He is sure he went on Thursday.    Chest Pain      Prior to Admission medications   Medication Sig Start Date End Date Taking? Authorizing Provider  acetaminophen  (TYLENOL ) 500 MG tablet Take 500 mg by mouth every 6 (six) hours as needed for moderate pain.    [provider]  albuterol  (VENTOLIN  HFA) 108 (90 Base) MCG/ACT inhaler Inhale 2 puffs into the lungs every 4 (four) hours as needed for wheezing or shortness of breath. 10/23/23   Paseda, Folashade R, FNP  amLODipine  (NORVASC ) 5 MG tablet Take 1 tablet (5 mg total) by mouth daily. 04/14/24   Paseda, Folashade R, FNP  benztropine  (COGENTIN ) 1 MG tablet Take 1 mg by mouth 2 (two) times daily.    [provider]  cinacalcet  (SENSIPAR ) 30 MG tablet Take 1 tablet (30 mg total) by mouth every evening. 11/16/23   Paseda, Folashade R, FNP  citalopram  (CELEXA ) 20 MG tablet Take 1 tablet (20 mg total) by mouth every morning. 11/16/23   Paseda, Folashade R, FNP  cyclobenzaprine  (FLEXERIL ) 5 MG tablet Take 1 tablet (5 mg total) by mouth 3 (three) times daily as needed. 10/23/23   Paseda, Folashade R, FNP  fluticasone  (FLONASE ) 50 MCG/ACT nasal spray Place 2  sprays into both nostrils daily. Patient not taking: Reported on 04/14/2024 10/23/23   Paseda, Folashade R, FNP  ibuprofen  (ADVIL ) 600 MG tablet Take 1 tablet (600 mg total) by mouth every 8 (eight) hours as needed (severe pain 7-10). 10/23/23   Paseda, Folashade R, FNP  meclizine  (ANTIVERT ) 25 MG tablet Take 1 tablet (25 mg total) by mouth 3 (three) times daily as needed for dizziness. 04/30/24   Vicky Charleston, PA-C  paliperidone Palmitate ER (INVEGA TRINZA) 819 MG/2.63ML injection Inject 819 mg into the muscle every 3 (three) months. Patient not taking: Reported on 04/14/2024    [provider]  sevelamer carbonate (RENVELA) 800 MG tablet Take 2,400 mg by mouth 3 (three) times daily with meals.    [provider]  valbenazine  (INGREZZA ) 80 MG capsule Samples of this drug were given to the patient, quantity 1, Lot Number [id number] 11/27/22   Tat, Asberry RAMAN, DO    Allergies: Aspirin and Ibuprofen     Review of Systems  Cardiovascular:  Positive for chest pain.    Updated Vital Signs BP (!) 148/83 (BP Location: Right Arm)   Pulse 97   Temp 98.2 F (36.8 C)   Resp 15   SpO2 97%   Physical Exam Vitals and nursing note reviewed.  Constitutional:      General: He is not in acute distress.    Appearance: He is well-developed.  HENT:     Head: Normocephalic  and atraumatic.  Eyes:     Conjunctiva/sclera: Conjunctivae normal.  Cardiovascular:     Rate and Rhythm: Normal rate and regular rhythm.  Pulmonary:     Effort: Pulmonary effort is normal. No respiratory distress.     Breath sounds: Normal breath sounds.  Chest:     Chest wall: No tenderness.  Abdominal:     Palpations: Abdomen is soft.     Tenderness: There is no abdominal tenderness.  Musculoskeletal:        General: No swelling.     Cervical back: Neck supple.     Right lower leg: No edema.     Left lower leg: No edema.  Skin:    General: Skin is warm and dry.     Capillary Refill: Capillary refill  takes less than 2 seconds.  Neurological:     Mental Status: He is alert.  Psychiatric:        Mood and Affect: Mood normal.     (all labs ordered are listed, but only abnormal results are displayed) Labs Reviewed  BASIC METABOLIC PANEL WITH GFR - Abnormal; Notable for the following components:      Result Value   BUN 63 (*)    Creatinine, Ser 12.25 (*)    Calcium 8.2 (*)    GFR, Estimated 5 (*)    Anion gap 17 (*)    All other components within normal limits  CBC - Abnormal; Notable for the following components:   RBC 3.06 (*)    Hemoglobin 9.3 (*)    HCT 28.6 (*)    All other components within normal limits  I-STAT CHEM 8, ED - Abnormal; Notable for the following components:   BUN 63 (*)    Creatinine, Ser 12.50 (*)    Calcium, Ion 1.03 (*)    Hemoglobin 9.5 (*)    HCT 28.0 (*)    All other components within normal limits  TROPONIN I (HIGH SENSITIVITY) - Abnormal; Notable for the following components:   Troponin I (High Sensitivity) 78 (*)    All other components within normal limits  TROPONIN I (HIGH SENSITIVITY) - Abnormal; Notable for the following components:   Troponin I (High Sensitivity) 51 (*)    All other components within normal limits    EKG: EKG Interpretation Date/Time:  Sunday May 11 2024 23:25:36 EST Ventricular Rate:  95 PR Interval:  138 QRS Duration:  89 QT Interval:  375 QTC Calculation: 472 R Axis:   72  Text Interpretation: Sinus rhythm Minimal ST depression, inferior leads Confirmed by Griselda Norris [phone]) on 05/12/2024 1:30:04 AM  Radiology: [facility] Chest Port 1 View Result Date: 05/11/2024 EXAM: 1 VIEW XRAY OF THE CHEST 05/11/2024 11:25:00 PM COMPARISON: 04/30/2024 CLINICAL HISTORY: Chest pain FINDINGS: LUNGS AND PLEURA: Mild diffuse increased pulmonary interstitial opacity, similar to prior studies. No pleural effusion. No pneumothorax. HEART AND MEDIASTINUM: No acute abnormality of the cardiac and mediastinal silhouettes. BONES AND  SOFT TISSUES: No acute osseous abnormality. IMPRESSION: 1. No acute findings. Electronically signed by: Morgane Naveau MD 05/11/2024 11:32 PM EST RP Workstation: [id number]     Procedures   Medications Ordered in the ED  morphine  (PF) 4 MG/ML injection 4 mg (4 mg Intravenous Given 05/12/24 0357)                HEART Score: 5                    Medical Decision Making Amount and/or Complexity of  Data Reviewed Labs: ordered. Radiology: ordered.   This patient presents to the ED for concern of chest pain and bodyaches, this involves an extensive number of treatment options, and is a complaint that carries with it a high risk of complications and morbidity.  The differential diagnosis includes ACS, electrolyte abnormality, complication of potentially missed dialysis, pneumonia, PE, viral illness, others   Co morbidities / Chronic conditions that complicate the patient evaluation  As noted in HPI   Additional history obtained:  Additional history obtained from EMR Patient presented on the third complaining of sore throat and cough, complaining of feeling cold and requesting coffee.  He left before being seen.  He was seen on the third complaining of abdominal pain with reassuring workup.  He was seen on the 23rd with plans for emergent dialysis but left AMA due to displeasure with provided food.  He was seen again on the 25th and got dialysis before discharge   Lab Tests:  I Ordered, and personally interpreted labs.  The pertinent results include: Potassium 4.4, hemoglobin 9.3 (at baseline), initial troponin 78, repeat troponin 51   Imaging Studies ordered:  I ordered imaging studies including chest x-ray I independently visualized and interpreted imaging which showed no acute findings I agree with the radiologist interpretation   Cardiac Monitoring: / EKG:  The patient was maintained on a cardiac monitor.  I personally viewed and interpreted the cardiac monitored which  showed an underlying rhythm of: Sinus rhythm   Problem List / ED Course / Critical interventions / Medication management   I ordered medication including morphine    Reevaluation of the patient after these medicines showed that the patient improved   Consultations Obtained:  I requested consultation with the hospitalist, Dr.Thomas,  and discussed lab and imaging findings as well as pertinent plan - they recommend: admission   Social Determinants of Health:  Patient smokes cigarettes   Test / Admission - Considered:  Patient with heart score of 5, with no previous cardiac history.  He does not appear clinically to be fluid overloaded with lungs that are clear to auscultation and no noted edema.  He has potassium is in the normal range and I do not believe he needs emergent hemodialysis at this time.  With elevated troponins and continued chest pressure I feel the patient would benefit from admission for observation at this time. Aspirin not administered due to reported allergy      Final diagnoses:  Chest pain, unspecified type    ED Discharge Orders     None          Logan Ubaldo KATHEE DEVONNA 05/12/24 0427    Griselda Norris, MD 05/13/24 1719

## 2024-05-11 NOTE — ED Triage Notes (Signed)
 Pt arrives via [facility] c/o midsternal CP radiating to his back. Pt denies SOB, N/V. Pt has ESRD on dialysis T, Th, Sa and last tx was Th.

## 2024-05-12 ENCOUNTER — Emergency Department (HOSPITAL_COMMUNITY)
Admission: EM | Admit: 2024-05-12 | Discharge: 2024-05-13 | Disposition: A | Payer: MEDICAID | Attending: Emergency Medicine | Admitting: Emergency Medicine

## 2024-05-12 ENCOUNTER — Emergency Department (HOSPITAL_COMMUNITY): Payer: MEDICAID

## 2024-05-12 ENCOUNTER — Encounter (HOSPITAL_COMMUNITY): Payer: Self-pay

## 2024-05-12 ENCOUNTER — Observation Stay (HOSPITAL_COMMUNITY): Payer: MEDICAID

## 2024-05-12 ENCOUNTER — Other Ambulatory Visit: Payer: Self-pay

## 2024-05-12 DIAGNOSIS — N186 End stage renal disease: Secondary | ICD-10-CM

## 2024-05-12 DIAGNOSIS — R079 Chest pain, unspecified: Secondary | ICD-10-CM | POA: Diagnosis not present

## 2024-05-12 DIAGNOSIS — I10 Essential (primary) hypertension: Secondary | ICD-10-CM | POA: Diagnosis not present

## 2024-05-12 DIAGNOSIS — J180 Bronchopneumonia, unspecified organism: Secondary | ICD-10-CM

## 2024-05-12 DIAGNOSIS — R748 Abnormal levels of other serum enzymes: Secondary | ICD-10-CM

## 2024-05-12 DIAGNOSIS — R0602 Shortness of breath: Secondary | ICD-10-CM

## 2024-05-12 DIAGNOSIS — R072 Precordial pain: Secondary | ICD-10-CM

## 2024-05-12 LAB — BASIC METABOLIC PANEL WITH GFR
Anion gap: 17 — ABNORMAL HIGH (ref 5–15)
Anion gap: 8 (ref 5–15)
BUN: 63 mg/dL — ABNORMAL HIGH (ref 6–20)
BUN: 65 mg/dL — ABNORMAL HIGH (ref 6–20)
CO2: 23 mmol/L (ref 22–32)
CO2: 26 mmol/L (ref 22–32)
Calcium: 8.2 mg/dL — ABNORMAL LOW (ref 8.9–10.3)
Calcium: 8.3 mg/dL — ABNORMAL LOW (ref 8.9–10.3)
Chloride: 105 mmol/L (ref 98–111)
Chloride: 108 mmol/L (ref 98–111)
Creatinine, Ser: 12.25 mg/dL — ABNORMAL HIGH (ref 0.61–1.24)
Creatinine, Ser: 12.86 mg/dL — ABNORMAL HIGH (ref 0.61–1.24)
GFR, Estimated: 5 mL/min — ABNORMAL LOW (ref >60)
GFR, Estimated: 5 mL/min — ABNORMAL LOW (ref >60)
Glucose, Bld: 80 mg/dL (ref 70–99)
Glucose, Bld: 94 mg/dL (ref 70–99)
Potassium: 4.5 mmol/L (ref 3.5–5.1)
Potassium: 4.5 mmol/L (ref 3.5–5.1)
Sodium: 142 mmol/L (ref 135–145)
Sodium: 145 mmol/L (ref 135–145)

## 2024-05-12 LAB — CBC
HCT: 28.6 % — ABNORMAL LOW (ref 39.0–52.0)
Hemoglobin: 9.3 g/dL — ABNORMAL LOW (ref 13.0–17.0)
MCH: 30.4 pg (ref 26.0–34.0)
MCHC: 32.5 g/dL (ref 30.0–36.0)
MCV: 93.5 fL (ref 80.0–100.0)
Platelets: 234 K/uL (ref 150–400)
RBC: 3.06 MIL/uL — ABNORMAL LOW (ref 4.22–5.81)
RDW: 13.9 % (ref 11.5–15.5)
WBC: 7.9 K/uL (ref 4.0–10.5)
nRBC: 0 % (ref 0.0–0.2)

## 2024-05-12 LAB — RESPIRATORY PANEL BY PCR

## 2024-05-12 LAB — I-STAT CHEM 8, ED
BUN: 63 mg/dL — ABNORMAL HIGH (ref 6–20)
Calcium, Ion: 1.03 mmol/L — ABNORMAL LOW (ref 1.15–1.40)
Chloride: 105 mmol/L (ref 98–111)
Creatinine, Ser: 12.5 mg/dL — ABNORMAL HIGH (ref 0.61–1.24)
Glucose, Bld: 78 mg/dL (ref 70–99)
HCT: 28 % — ABNORMAL LOW (ref 39.0–52.0)
Hemoglobin: 9.5 g/dL — ABNORMAL LOW (ref 13.0–17.0)
Potassium: 4.4 mmol/L (ref 3.5–5.1)
Sodium: 143 mmol/L (ref 135–145)
TCO2: 24 mmol/L (ref 22–32)

## 2024-05-12 LAB — SEDIMENTATION RATE: Sed Rate: 17 mm/h — ABNORMAL HIGH (ref 0–16)

## 2024-05-12 LAB — TROPONIN I (HIGH SENSITIVITY)
Troponin I (High Sensitivity): 36 ng/L — ABNORMAL HIGH (ref <18)
Troponin I (High Sensitivity): 51 ng/L — ABNORMAL HIGH (ref <18)
Troponin I (High Sensitivity): 78 ng/L — ABNORMAL HIGH (ref <18)

## 2024-05-12 LAB — CBC WITH DIFFERENTIAL/PLATELET
Abs Immature Granulocytes: 0.02 K/uL (ref 0.00–0.07)
Basophils Absolute: 0 K/uL (ref 0.0–0.1)
Basophils Relative: 1 %
Eosinophils Absolute: 0.2 K/uL (ref 0.0–0.5)
Eosinophils Relative: 3 %
HCT: 29.8 % — ABNORMAL LOW (ref 39.0–52.0)
Hemoglobin: 9.2 g/dL — ABNORMAL LOW (ref 13.0–17.0)
Immature Granulocytes: 0 %
Lymphocytes Relative: 23 %
Lymphs Abs: 1.8 K/uL (ref 0.7–4.0)
MCH: 29.8 pg (ref 26.0–34.0)
MCHC: 30.9 g/dL (ref 30.0–36.0)
MCV: 96.4 fL (ref 80.0–100.0)
Monocytes Absolute: 0.7 K/uL (ref 0.1–1.0)
Monocytes Relative: 10 %
Neutro Abs: 5 K/uL (ref 1.7–7.7)
Neutrophils Relative %: 63 %
Platelets: 235 K/uL (ref 150–400)
RBC: 3.09 MIL/uL — ABNORMAL LOW (ref 4.22–5.81)
RDW: 13.8 % (ref 11.5–15.5)
WBC: 7.8 K/uL (ref 4.0–10.5)
nRBC: 0 % (ref 0.0–0.2)

## 2024-05-12 LAB — C-REACTIVE PROTEIN: CRP: 0.6 mg/dL (ref <1.0)

## 2024-05-12 LAB — D-DIMER, QUANTITATIVE: D-Dimer, Quant: 0.53 ug{FEU}/mL — ABNORMAL HIGH (ref 0.00–0.50)

## 2024-05-12 MED ORDER — ACETAMINOPHEN 325 MG PO TABS: 650.0000 mg | ORAL_TABLET | ORAL | Status: DC | PRN

## 2024-05-12 MED ORDER — ONDANSETRON HCL 4 MG/2ML IJ SOLN: 4.0000 mg | Freq: Four times a day (QID) | INTRAMUSCULAR | Status: DC | PRN

## 2024-05-12 MED ORDER — ALBUTEROL SULFATE (2.5 MG/3ML) 0.083% IN NEBU: 2.5000 mg | INHALATION_SOLUTION | RESPIRATORY_TRACT | Status: DC | PRN

## 2024-05-12 MED ORDER — NITROGLYCERIN 2 % TD OINT
0.5000 [in_us] | TOPICAL_OINTMENT | Freq: Once | TRANSDERMAL | Status: AC
  Administered 2024-05-12: 0.5 [in_us] via TOPICAL
  Filled 2024-05-12: qty 1

## 2024-05-12 MED ORDER — ACETAMINOPHEN 500 MG PO TABS
1000.0000 mg | ORAL_TABLET | Freq: Once | ORAL | Status: DC
  Filled 2024-05-12: qty 2

## 2024-05-12 MED ORDER — CLOPIDOGREL BISULFATE 75 MG PO TABS
75.0000 mg | ORAL_TABLET | Freq: Once | ORAL | Status: AC
  Administered 2024-05-12: 75 mg via ORAL
  Filled 2024-05-12: qty 1

## 2024-05-12 MED ORDER — PREDNISONE 20 MG PO TABS: 40.0000 mg | ORAL_TABLET | Freq: Every day | ORAL | 0 refills | Status: DC

## 2024-05-12 MED ORDER — CHLORHEXIDINE GLUCONATE CLOTH 2 % EX PADS: 6.0000 | MEDICATED_PAD | Freq: Every day | CUTANEOUS | Status: DC

## 2024-05-12 MED ORDER — HEPARIN SODIUM (PORCINE) 5000 UNIT/ML IJ SOLN
5000.0000 [IU] | Freq: Three times a day (TID) | INTRAMUSCULAR | Status: DC
  Administered 2024-05-12: 5000 [IU] via SUBCUTANEOUS
  Filled 2024-05-12: qty 1

## 2024-05-12 MED ORDER — NITROGLYCERIN 0.4 MG SL SUBL
0.4000 mg | SUBLINGUAL_TABLET | SUBLINGUAL | Status: DC | PRN
  Administered 2024-05-12: 0.4 mg via SUBLINGUAL
  Filled 2024-05-12: qty 1

## 2024-05-12 MED ORDER — MORPHINE SULFATE (PF) 2 MG/ML IV SOLN: 2.0000 mg | INTRAVENOUS | Status: DC | PRN

## 2024-05-12 MED ORDER — DOXYCYCLINE HYCLATE 100 MG PO CAPS: 100.0000 mg | ORAL_CAPSULE | Freq: Two times a day (BID) | ORAL | 0 refills | Status: DC

## 2024-05-12 MED ORDER — MORPHINE SULFATE (PF) 4 MG/ML IV SOLN
4.0000 mg | Freq: Once | INTRAVENOUS | Status: AC
  Administered 2024-05-12: 4 mg via INTRAVENOUS
  Filled 2024-05-12: qty 1

## 2024-05-12 NOTE — ED Notes (Signed)
 Meal tray ordered

## 2024-05-12 NOTE — Progress Notes (Signed)
 Late note entry 12/8 2:57pm Contacted [facility] to inform pt left AMA. No further support needed.   Lavanda Daylan [facility] nav [phone]

## 2024-05-12 NOTE — ED Notes (Signed)
 [facility]S called to clean the room.

## 2024-05-12 NOTE — Consult Note (Signed)
 Renal Service Consult Note [facility] William JONETTA Fret, MD  Patient: William Gregory Date: 05/12/2024 Requesting Physician: Dr. LOIS Applebaum  Reason for Consult: ESRD patient with chest pain, elevated troponin HPI: The patient is a 38 y.o. year-old w/ PMH as below who presented to ED for chest pain last night.  Troponin was around 500.  K+ 4.4 creatinine, creatinine 12.5, BUN 63.  Hemoglobin 9.5, WBC 7K.  BP 150/85, RR 14-20, HR 90.  Patient was admitted.  We are asked to see for dialysis.   Pt seen in ED room.  Patient is not in distress.  States she is homeless.  States his last dialysis was Saturday at [facility] where he normally goes.  Has been living outside sleeping in a tent.  Having pain in his legs, pain in his chest and diarrhea.   ROS - denies CP, no joint pain, no HA, no blurry vision, no rash, no diarrhea, no nausea/ vomiting   Past Medical History  Past Medical History:  Diagnosis Date   Alcohol abuse    Arthritis    Asthma    Back pain    Chronic kidney disease (CKD)    Stage 5 Dialysis Tu/Th/Sa   Depression    GERD (gastroesophageal reflux disease)    Hypertension    Knee joint pain, left    Neck pain    Pneumonia    PONV (postoperative nausea and vomiting)    Psychiatric problem    unspecified by patient (receives injections bi-weekly)   Tobacco abuse    Past Surgical History  Past Surgical History:  Procedure Laterality Date   arm surgery     AV FISTULA PLACEMENT Left 07/19/2022   Procedure: LEFT ARM ARTERIOVENOUS (AV) FISTULA CREATION;  Surgeon: Serene Gaile ORN, MD;  Location: [facility] OR;  Service: Vascular;  Laterality: Left;   IR FLUORO GUIDE CV LINE RIGHT  03/31/2022   IR PERC TUN PERIT CATH WO PORT S&I /IMAG  03/31/2022   IR US  GUIDE VASC ACCESS RIGHT  03/31/2022   Family History  Family History  Problem Relation Age of Onset   Diabetes Mother    Diabetes Father    CVA Father    Dementia Maternal Grandmother    Kidney  disease Maternal Grandmother    Heart attack Neg Hx    Social History  reports that he has been smoking cigarettes. He has never been exposed to tobacco smoke. He has never used smokeless tobacco. He reports that he does not currently use alcohol. He reports current drug use. Drug: Marijuana. Allergies  Allergies  Allergen Reactions   Aspirin Swelling   Ibuprofen  Rash   Home medications Prior to Admission medications   Medication Sig Start Date End Date Taking? Authorizing Provider  acetaminophen  (TYLENOL ) 500 MG tablet Take 500 mg by mouth every 6 (six) hours as needed for moderate pain.   Yes [provider]  amLODipine  (NORVASC ) 5 MG tablet Take 1 tablet (5 mg total) by mouth daily. 04/14/24  Yes Paseda, Folashade R, FNP  benztropine  (COGENTIN ) 1 MG tablet Take 1 mg by mouth 2 (two) times daily.   Yes [provider]  cinacalcet  (SENSIPAR ) 30 MG tablet Take 1 tablet (30 mg total) by mouth every evening. 11/16/23  Yes Paseda, Folashade R, FNP  citalopram  (CELEXA ) 20 MG tablet Take 1 tablet (20 mg total) by mouth every morning. 11/16/23  Yes Paseda, Folashade R, FNP  ibuprofen  (ADVIL ) 600 MG tablet Take 1 tablet (600 mg  total) by mouth every 8 (eight) hours as needed (severe pain 7-10). 10/23/23  Yes Paseda, Folashade R, FNP  meclizine  (ANTIVERT ) 25 MG tablet Take 1 tablet (25 mg total) by mouth 3 (three) times daily as needed for dizziness. 04/30/24  Yes Vicky Charleston, PA-C  sevelamer carbonate (RENVELA) 800 MG tablet Take 2,400 mg by mouth 3 (three) times daily with meals.   Yes [provider]  valbenazine  (INGREZZA ) 80 MG capsule Samples of this drug were given to the patient, quantity 1, Lot Number [id number] Patient taking differently: Take 80 mg by mouth daily. 11/27/22  Yes Tat, Asberry RAMAN, DO     Vitals:   05/12/24 0407 05/12/24 0747 05/12/24 0844 05/12/24 0845  BP: (!) 148/83 (!) 153/85    Pulse: 97  78 91  Resp: 15     Temp: 98.2 F (36.8 C)      TempSrc:      SpO2: 97%  99% 99%   Exam Gen alert, no distress, on RA Sclera anicteric, throat clear  No jvd or bruits Chest clear bilat to bases RRR no MRG Abd soft ntnd no mass or ascites +bs Ext no pitting LE/ UE edema, no other edema Neuro is alert, Ox 3 , nf    LUA AVF +bruit   Home bp meds: Norvasc    OP HD: TTS [facility] from feb 2025   4h   B400  83.9kg  2K bath  AVF  Heparin  3400    K+ 4.4, BUN 63, creat 12.5, CO2 23  Hb 9.5, wbc 7K  BP 150/88, HR 91, RR 15-21, RA   CXR 12/07: no active disease   Assessment/ Plan: Chest pain: admitted for r/o ACS. Cardiology consulted.  ESRD: on HD TTS. Per pt last OP HD was this past Saturday. Get OP records. Labs and vol are stable. Next HD tomorrow.  HTN: Blood pressures slightly high, follow. Volume: Patient states legs are swollen but on exam has no pitting edema.  Max UF 3-4 liters with HD. 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       Myer Fret  MD [facility] 05/12/2024, 9:16 AM  Recent Labs  Lab 05/05/24 2138 05/12/24 0006 05/12/24 0035  HGB 9.5* 9.3* 9.5*  ALBUMIN 3.5  --   --   CALCIUM 8.3* 8.2*  --   CREATININE 13.07* 12.25* 12.50*  K 4.0 4.5 4.4   Inpatient medications:  acetaminophen   1,000 mg Oral Once   heparin   5,000 Units Subcutaneous Q8H    acetaminophen , albuterol , morphine  injection, nitroGLYCERIN , ondansetron  (ZOFRAN ) IV

## 2024-05-12 NOTE — ED Notes (Signed)
 AMA paperwork signed.

## 2024-05-12 NOTE — ED Triage Notes (Signed)
 First Nurse: Pt presents to be admitted to the hospital. He was here earlier in the day to be admitted for cardiac workup with elevated trop. Pt states he left to get food and now he has returned.

## 2024-05-12 NOTE — ED Notes (Signed)
 Dr. Raenelle notified pt refused labs.

## 2024-05-12 NOTE — ED Notes (Signed)
 Pt refused lab draw attempt rn lily aware at

## 2024-05-12 NOTE — ED Triage Notes (Signed)
 Patient here earlier today for eval and has returned. Patient is A&Ox4, reports back pain at this time.

## 2024-05-12 NOTE — ED Notes (Signed)
 Patient refused blood work. Dr. Raenelle notified.

## 2024-05-12 NOTE — ED Notes (Signed)
 Dr. Raenelle notified pt is wanting to leave AMA

## 2024-05-12 NOTE — ED Notes (Signed)
 PT educated on risks of leaving AMA which include worsening condition, permanent disability, and death. PT educated on benefits of staying in the hospital such as treatment plan, continued testing, and continuation of care.

## 2024-05-12 NOTE — H&P (Addendum)
 History and Physical    William Gregory FMW:[id number] DOB: 1986-01-13 DOA: 05/11/2024  PCP: Pcp, No  Patient coming from:  home  I have personally briefly reviewed patient's old medical records in [facility]  Chief Complaint: chest pain of 3-4 days intermittently  HPI: William Gregory is a 38 y.o. male with medical history significant of ETOH abuse, Asthma , ESRD TTS, GERD, HTN, Psych d/o nos who presents to ED with complaint of central chest tightness. Patient notes no associated n/v/d/ abdominal pain, shortness of breath or radiation of the pain. Patient  of notes is believed to have missed his last HD session. Patient is poor historian does not fully participate in the interview.    ED Course:  Afeb, bp 153/95, hr 98, rr 15 sat 99% on ra  EKG:nsr , st changes unchanged from prior  Wbc 7.9, hgb 9.3, wbc 234 Na 145, K 4.5,  Cl 105, cr 12.25 AG 17  CE78, 51 Tx morphine    Review of Systems: As per HPI otherwise 10 point review of systems negative.   Past Medical History:  Diagnosis Date   Alcohol abuse    Arthritis    Asthma    Back pain    Chronic kidney disease (CKD)    Stage 5 Dialysis Tu/Th/Sa   Depression    GERD (gastroesophageal reflux disease)    Hypertension    Knee joint pain, left    Neck pain    Pneumonia    PONV (postoperative nausea and vomiting)    Psychiatric problem    unspecified by patient (receives injections bi-weekly)   Tobacco abuse     Past Surgical History:  Procedure Laterality Date   arm surgery     AV FISTULA PLACEMENT Left 07/19/2022   Procedure: LEFT ARM ARTERIOVENOUS (AV) FISTULA CREATION;  Surgeon: Serene Gaile ORN, MD;  Location: [facility] OR;  Service: Vascular;  Laterality: Left;   IR FLUORO GUIDE CV LINE RIGHT  03/31/2022   IR PERC TUN PERIT CATH WO PORT S&I /IMAG  03/31/2022   IR US  GUIDE VASC ACCESS RIGHT  03/31/2022     reports that he has been smoking cigarettes. He has never been exposed to tobacco smoke. He has  never used smokeless tobacco. He reports that he does not currently use alcohol. He reports current drug use. Drug: Marijuana.  Allergies  Allergen Reactions   Aspirin Swelling   Ibuprofen  Rash    Family History  Problem Relation Age of Onset   Diabetes Mother    Diabetes Father    CVA Father    Dementia Maternal Grandmother    Kidney disease Maternal Grandmother    Heart attack Neg Hx     Prior to Admission medications   Medication Sig Start Date End Date Taking? Authorizing Provider  acetaminophen  (TYLENOL ) 500 MG tablet Take 500 mg by mouth every 6 (six) hours as needed for moderate pain.    [provider]  albuterol  (VENTOLIN  HFA) 108 (90 Base) MCG/ACT inhaler Inhale 2 puffs into the lungs every 4 (four) hours as needed for wheezing or shortness of breath. 10/23/23   Paseda, Folashade R, FNP  amLODipine  (NORVASC ) 5 MG tablet Take 1 tablet (5 mg total) by mouth daily. 04/14/24   Paseda, Folashade R, FNP  benztropine  (COGENTIN ) 1 MG tablet Take 1 mg by mouth 2 (two) times daily.    [provider]  cinacalcet  (SENSIPAR ) 30 MG tablet Take 1 tablet (30 mg total) by mouth every evening.  11/16/23   Paseda, Folashade R, FNP  citalopram  (CELEXA ) 20 MG tablet Take 1 tablet (20 mg total) by mouth every morning. 11/16/23   Paseda, Folashade R, FNP  cyclobenzaprine  (FLEXERIL ) 5 MG tablet Take 1 tablet (5 mg total) by mouth 3 (three) times daily as needed. 10/23/23   Paseda, Folashade R, FNP  fluticasone  (FLONASE ) 50 MCG/ACT nasal spray Place 2 sprays into both nostrils daily. Patient not taking: Reported on 04/14/2024 10/23/23   Paseda, Folashade R, FNP  ibuprofen  (ADVIL ) 600 MG tablet Take 1 tablet (600 mg total) by mouth every 8 (eight) hours as needed (severe pain 7-10). 10/23/23   Paseda, Folashade R, FNP  meclizine  (ANTIVERT ) 25 MG tablet Take 1 tablet (25 mg total) by mouth 3 (three) times daily as needed for dizziness. 04/30/24   Vicky Charleston, PA-C  paliperidone  Palmitate ER (INVEGA TRINZA) 819 MG/2.63ML injection Inject 819 mg into the muscle every 3 (three) months. Patient not taking: Reported on 04/14/2024    [provider]  sevelamer carbonate (RENVELA) 800 MG tablet Take 2,400 mg by mouth 3 (three) times daily with meals.    [provider]  valbenazine  (INGREZZA ) 80 MG capsule Samples of this drug were given to the patient, quantity 1, Lot Number [id number] 11/27/22   TatAsberry RAMAN, DO    Physical Exam: Vitals:   05/11/24 2325 05/11/24 2330 05/12/24 0407  BP: (!) 153/95 (!) 154/87 (!) 148/83  Pulse: 98 93 97  Resp: 15 17 15   Temp: 98 F (36.7 C)  98.2 F (36.8 C)  TempSrc: Oral    SpO2: 99% 100% 97%    Constitutional: NAD, calm, comfortable Vitals:   05/11/24 2325 05/11/24 2330 05/12/24 0407  BP: (!) 153/95 (!) 154/87 (!) 148/83  Pulse: 98 93 97  Resp: 15 17 15   Temp: 98 F (36.7 C)  98.2 F (36.8 C)  TempSrc: Oral    SpO2: 99% 100% 97%   Eyes: PERRL, lids and conjunctivae normal ENMT: Mucous membranes are moist. Posterior pharynx clear of any exudate or lesions.Normal dentition.  Neck: normal, supple, no masses, no thyromegaly Respiratory: clear to auscultation bilaterally, no wheezing, no crackles. Normal respiratory effort. No accessory muscle use.  Cardiovascular: Regular rate and rhythm,?rubs / gallops/ no murmurs /  No extremity edema. 2+ pedal pulses.  Abdomen: no tenderness, no masses palpated. No hepatosplenomegaly. Bowel sounds positive.  Musculoskeletal: no clubbing / cyanosis. No joint deformity upper and lower extremities. Good ROM, no contractures. Normal muscle tone.  Skin: no rashes, lesions, ulcers. No induration Neurologic: CN 2-12 grossly intact. Sensation intact, DTR normal. Strength 5/5 in all 4.  Psychiatric: Normal judgment and insight. Alert and oriented x 3. Normal mood.    Labs on Admission: I have personally reviewed following labs and imaging studies  CBC: Recent Labs  Lab  05/05/24 2138 05/12/24 0006 05/12/24 0035  WBC 9.0 7.9  --   NEUTROABS 7.5  --   --   HGB 9.5* 9.3* 9.5*  HCT 30.0* 28.6* 28.0*  MCV 95.2 93.5  --   PLT 243 234  --    Basic Metabolic Panel: Recent Labs  Lab 05/05/24 2138 05/12/24 0006 05/12/24 0035  NA 142 145 143  K 4.0 4.5 4.4  CL 107 105 105  CO2 22 23  --   GLUCOSE 69* 80 78  BUN 63* 63* 63*  CREATININE 13.07* 12.25* 12.50*  CALCIUM 8.3* 8.2*  --    GFR: Estimated Creatinine Clearance: 8 mL/min (A) (  by C-G formula based on SCr of 12.5 mg/dL (H)). Liver Function Tests: Recent Labs  Lab 05/05/24 2138  AST 24  ALT 13  ALKPHOS 76  BILITOT 0.5  PROT 6.2*  ALBUMIN 3.5   Recent Labs  Lab 05/05/24 2138  LIPASE 46   No results for input(s): AMMONIA in the last 168 hours. Coagulation Profile: No results for input(s): INR, PROTIME in the last 168 hours. Cardiac Enzymes: No results for input(s): CKTOTAL, CKMB, CKMBINDEX, TROPONINI in the last 168 hours. BNP (last 3 results) No results for input(s): PROBNP in the last 8760 hours. HbA1C: No results for input(s): HGBA1C in the last 72 hours. CBG: No results for input(s): GLUCAP in the last 168 hours. Lipid Profile: No results for input(s): CHOL, HDL, LDLCALC, TRIG, CHOLHDL, LDLDIRECT in the last 72 hours. Thyroid Function Tests: No results for input(s): TSH, T4TOTAL, FREET4, T3FREE, THYROIDAB in the last 72 hours. Anemia Panel: No results for input(s): VITAMINB12, FOLATE, FERRITIN, TIBC, IRON , RETICCTPCT in the last 72 hours. Urine analysis:    Component Value Date/Time   COLORURINE STRAW (A) 08/02/2023 1442   APPEARANCEUR CLEAR 08/02/2023 1442   LABSPEC 1.009 08/02/2023 1442   LABSPEC 1.015 03/29/2022 1118   PHURINE 8.0 08/02/2023 1442   GLUCOSEU 150 (A) 08/02/2023 1442   HGBUR SMALL (A) 08/02/2023 1442   HGBUR negative 12/15/2008 1313   BILIRUBINUR NEGATIVE 08/02/2023 1442   BILIRUBINUR negative  03/29/2022 1118   KETONESUR NEGATIVE 08/02/2023 1442   PROTEINUR 100 (A) 08/02/2023 1442   UROBILINOGEN 1.0 04/21/2009 0315   NITRITE NEGATIVE 08/02/2023 1442   LEUKOCYTESUR NEGATIVE 08/02/2023 1442    Radiological Exams on Admission: DG Chest Port 1 View Result Date: 05/11/2024 EXAM: 1 VIEW XRAY OF THE CHEST 05/11/2024 11:25:00 PM COMPARISON: 04/30/2024 CLINICAL HISTORY: Chest pain FINDINGS: LUNGS AND PLEURA: Mild diffuse increased pulmonary interstitial opacity, similar to prior studies. No pleural effusion. No pneumothorax. HEART AND MEDIASTINUM: No acute abnormality of the cardiac and mediastinal silhouettes. BONES AND SOFT TISSUES: No acute osseous abnormality. IMPRESSION: 1. No acute findings. Electronically signed by: Morgane Naveau MD 05/11/2024 11:32 PM EST RP Workstation: [id number]    EKG: Independently reviewed.   Assessment/Plan  Chest pain r/o ACS  - presumed due to missing HD vs possible Pericarditis/ACS but these dx thought to be less likely  - admit to progressive care  - support care for check pain / morphine  /nitroglycerin  prn -cycle ce  -echo in am  - await final cardiology consult  - patient is allergic to aspirin  -check crp/esr to be complete   ESRD  TTS - missed last  HD  session  - Renal consult for HD in am - electrolytes currently stable and patient does not appear fluid overloaded , bp acceptable.   ETOH abuse  -per patient in remission   Asthma  - no wheeze on exam  -resume home regimen once med rec completed   GERD - ppi   HTN  -resume home regimen once med rec completed  -currently acceptable    ETOH abuse, Asthma , ESRD TTS, GERD, HTN,  DVT prophylaxis: heparin  Code Status: full/ as discussed per patient wishes in event of cardiac arrest  Family Communication: none at bedside Disposition Plan: patient  expected to be admitted less than 2 midnights  Consults called: Cardiology  Admission status: cardiac tele   Camila DELENA Ned  MD [facility]   If 7PM-7AM, please contact night-coverage [email or web address] Password [email or web address]  05/12/2024, 5:44 AM

## 2024-05-12 NOTE — ED Notes (Signed)
Pt refusing to wear cardiac monitoring.  °

## 2024-05-12 NOTE — Progress Notes (Signed)
 Patient seen and examined.  Admitted early morning hours by nighttime hospitalist.  See H&P assessment plan for details.  On my exam, he is still in the emergency room.  I asked him how he is feeling and he had a flat affect and was more focused on his breakfast.  I asked him whether he is having chest pain and he said yes.  No other overnight events.  He declined to have further lab test.  Currently remains on room air and looks comfortable.  Chest pain: Troponins 78-51.  EKG were nonischemic.  Will check 2D echocardiogram.  No evidence of acute coronary syndrome.  ESRD on hemodialysis: TTS schedule.  Apparently received dialysis on Saturday.  Next dialysis tomorrow.  Patient is homeless.  Will be monitored today, receive dialysis tomorrow and needs to go to shelter after dialysis tomorrow.  He is agreeable.   Same day admit. No charge visit

## 2024-05-12 NOTE — Progress Notes (Signed)
 Pt receives out-pt HD at [facility], TTS, 1140am chair time. Will continue to assist as needed.   Kathyleen Radice Dialysis Navigator [phone]

## 2024-05-12 NOTE — ED Provider Triage Note (Signed)
 Emergency Medicine Provider Triage Evaluation Note  William Gregory , a 38 y.o. male  was evaluated in triage.  Pt complains of Cough, aches. Was seen overnight and admitted for chest pain. Signed out AMA.   Review of Systems  Positive:  Negative:   Physical Exam  BP (!) 153/98   Pulse 92   Temp (!) 97.4 F (36.3 C)   Resp 18   SpO2 98%  Gen:   Awake, no distress   Resp:  Normal effort  MSK:   Moves extremities without difficulty  Other:    Medical Decision Making  Medically screening exam initiated at 7:20 PM.  Appropriate orders placed.  Erwin E Heldman was informed that the remainder of the evaluation will be completed by another provider, this initial triage assessment does not replace that evaluation, and the importance of remaining in the ED until their evaluation is complete.     Odell Balls, PA-C 05/12/24 1922

## 2024-05-12 NOTE — ED Provider Notes (Signed)
 [place] EMERGENCY DEPARTMENT AT [facility] Provider Note   CSN: [id number] Arrival date & time: 05/12/24  1856     Patient presents with: Shortness of Breath   William Gregory is a 38 y.o. male.  Patient is a 38 year old male with a history of alcohol abuse, tobacco use, MI, hypertension, and stage renal disease on dialysis Tuesday, Thursday, Saturday, schizophrenia, and homelessness who presents to the ED for continued chest tightness and cough.  Patient notes this has been increasing for several days.  He also notes he is having some lower back pain but feels like this is from walking out of a cold today.  He believes his last dialysis was Saturday but is unsure if he had gone.  He states he was admitted last night to look in his heart but had to leave this morning as he needed some food.  He states he will stay this time if he needs to be admitted.  States he is not on any cardiac medications.  Denies headache, dizziness, syncope, abdominal pain, nausea/vomiting/diarrhea.  No further complaints.      Shortness of Breath Associated symptoms: chest pain   Associated symptoms: no fever, no headaches and no vomiting        Prior to Admission medications   Medication Sig Start Date End Date Taking? Authorizing Provider  acetaminophen  (TYLENOL ) 500 MG tablet Take 500 mg by mouth every 6 (six) hours as needed for moderate pain.    [provider]  amLODipine  (NORVASC ) 5 MG tablet Take 1 tablet (5 mg total) by mouth daily. 04/14/24   Paseda, Folashade R, FNP  benztropine  (COGENTIN ) 1 MG tablet Take 1 mg by mouth 2 (two) times daily.    [provider]  cinacalcet  (SENSIPAR ) 30 MG tablet Take 1 tablet (30 mg total) by mouth every evening. 11/16/23   Paseda, Folashade R, FNP  citalopram  (CELEXA ) 20 MG tablet Take 1 tablet (20 mg total) by mouth every morning. 11/16/23   Paseda, Folashade R, FNP  ibuprofen  (ADVIL ) 600 MG tablet Take 1 tablet (600 mg total)  by mouth every 8 (eight) hours as needed (severe pain 7-10). 10/23/23   Paseda, Folashade R, FNP  meclizine  (ANTIVERT ) 25 MG tablet Take 1 tablet (25 mg total) by mouth 3 (three) times daily as needed for dizziness. 04/30/24   Vicky Charleston, PA-C  sevelamer carbonate (RENVELA) 800 MG tablet Take 2,400 mg by mouth 3 (three) times daily with meals.    [provider]  valbenazine  (INGREZZA ) 80 MG capsule Samples of this drug were given to the patient, quantity 1, Lot Number [id number] Patient taking differently: Take 80 mg by mouth daily. 11/27/22   TatAsberry RAMAN, DO    Allergies: Aspirin and Ibuprofen     Review of Systems  Constitutional:  Negative for chills and fever.  Respiratory:  Positive for chest tightness and shortness of breath.   Cardiovascular:  Positive for chest pain.  Gastrointestinal:  Negative for nausea and vomiting.  Neurological:  Negative for dizziness and headaches.  All other systems reviewed and are negative.   Updated Vital Signs BP (!) 157/91   Pulse 80   Temp (!) 97.4 F (36.3 C)   Resp (!) 22   SpO2 100%   Physical Exam Constitutional:      Appearance: He is normal weight.  HENT:     Head: Normocephalic and atraumatic.  Cardiovascular:     Rate and Rhythm: Normal rate.  Pulmonary:  Effort: Pulmonary effort is normal.     Breath sounds: Normal breath sounds.  Skin:    General: Skin is warm and dry.  Neurological:     Mental Status: He is alert and oriented to person, place, and time.  Psychiatric:        Mood and Affect: Mood normal.        Behavior: Behavior normal.     (all labs ordered are listed, but only abnormal results are displayed) Labs Reviewed  CBC WITH DIFFERENTIAL/PLATELET - Abnormal; Notable for the following components:      Result Value   RBC 3.09 (*)    Hemoglobin 9.2 (*)    HCT 29.8 (*)    All other components within normal limits  BASIC METABOLIC PANEL WITH GFR - Abnormal; Notable for the following  components:   BUN 65 (*)    Creatinine, Ser 12.86 (*)    Calcium 8.3 (*)    GFR, Estimated 5 (*)    All other components within normal limits  TROPONIN I (HIGH SENSITIVITY) - Abnormal; Notable for the following components:   Troponin I (High Sensitivity) 36 (*)    All other components within normal limits  TROPONIN I (HIGH SENSITIVITY)    EKG: EKG Interpretation Date/Time:  Monday May 12 2024 21:09:12 EST Ventricular Rate:  90 PR Interval:  132 QRS Duration:  94 QT Interval:  385 QTC Calculation: 472 R Axis:   68  Text Interpretation: Sinus rhythm Minimal ST depression, inferior leads Confirmed by Rogelia Satterfield ([id number]) on 05/12/2024 9:13:29 PM  Radiology: VAS US  LOWER EXTREMITY VENOUS (DVT) Result Date: 05/12/2024  Lower Venous DVT Study Patient Name:  William Gregory  Date of Exam:   05/12/2024 Medical Rec #: [id number]            Accession #:    [id number] Date of Birth: 1986/05/14            Patient Gender: M Patient Age:   18 years Exam Location:  [facility] Procedure:      VAS US  LOWER EXTREMITY VENOUS (DVT) Referring Phys: CAMILA NED --------------------------------------------------------------------------------  Indications: Swelling, Edema, chest pain radiating to back, and SOB.  Comparison Study: No prior exam. Performing Technologist: Edilia Elden Appl  Examination Guidelines: A complete evaluation includes B-mode imaging, spectral Doppler, color Doppler, and power Doppler as needed of all accessible portions of each vessel. Bilateral testing is considered an integral part of a complete examination. Limited examinations for reoccurring indications may be performed as noted. The reflux portion of the exam is performed with the patient in reverse Trendelenburg.  +---------+---------------+---------+-----------+----------+--------------+ RIGHT    CompressibilityPhasicitySpontaneityPropertiesThrombus Aging  +---------+---------------+---------+-----------+----------+--------------+ CFV      Full           Yes      Yes                                 +---------+---------------+---------+-----------+----------+--------------+ SFJ      Full           Yes      Yes                                 +---------+---------------+---------+-----------+----------+--------------+ FV Prox  Full                                                        +---------+---------------+---------+-----------+----------+--------------+  FV Mid   Full                                                        +---------+---------------+---------+-----------+----------+--------------+ FV DistalFull                                                        +---------+---------------+---------+-----------+----------+--------------+ PFV      Full                                                        +---------+---------------+---------+-----------+----------+--------------+ POP      Full           Yes      Yes                                 +---------+---------------+---------+-----------+----------+--------------+ PTV      Full                                                        +---------+---------------+---------+-----------+----------+--------------+ PERO     Full                                                        +---------+---------------+---------+-----------+----------+--------------+   +---------+---------------+---------+-----------+----------+--------------+ LEFT     CompressibilityPhasicitySpontaneityPropertiesThrombus Aging +---------+---------------+---------+-----------+----------+--------------+ CFV      Full           Yes      Yes                                 +---------+---------------+---------+-----------+----------+--------------+ SFJ      Full           Yes      Yes                                  +---------+---------------+---------+-----------+----------+--------------+ FV Prox  Full                                                        +---------+---------------+---------+-----------+----------+--------------+ FV Mid   Full                                                        +---------+---------------+---------+-----------+----------+--------------+  FV DistalFull                                                        +---------+---------------+---------+-----------+----------+--------------+ PFV      Full                                                        +---------+---------------+---------+-----------+----------+--------------+ POP      Full           Yes      Yes                                 +---------+---------------+---------+-----------+----------+--------------+ PTV      Full                                                        +---------+---------------+---------+-----------+----------+--------------+ PERO     Full                                                        +---------+---------------+---------+-----------+----------+--------------+     Summary: BILATERAL: - No evidence of deep vein thrombosis seen in the lower extremities, bilaterally. -No evidence of popliteal cyst, bilaterally.   *See table(s) above for measurements and observations. Electronically signed by Debby Robertson on 05/12/2024 at 4:46:32 PM.    Final    DG Chest Port 1 View Result Date: 05/11/2024 EXAM: 1 VIEW XRAY OF THE CHEST 05/11/2024 11:25:00 PM COMPARISON: 04/30/2024 CLINICAL HISTORY: Chest pain FINDINGS: LUNGS AND PLEURA: Mild diffuse increased pulmonary interstitial opacity, similar to prior studies. No pleural effusion. No pneumothorax. HEART AND MEDIASTINUM: No acute abnormality of the cardiac and mediastinal silhouettes. BONES AND SOFT TISSUES: No acute osseous abnormality. IMPRESSION: 1. No acute findings. Electronically signed by: Morgane Naveau MD  05/11/2024 11:32 PM EST RP Workstation: [id number]       Medications Ordered in the ED - No data to display  Clinical Course as of 05/12/24 2357  Mon May 12, 2024  2107 Chest x-ray from last evening reviewed and negative for acute abnormality. [AY]  2119 Creatinine(!): 12.86 Similar to baseline, hemodialysis patient [AY]  2119 Hemoglobin(!): 9.2 Stable chronic anemia [AY]    Clinical Course User Index [AY] Neysa Thersia RAMAN, PA-C             HEART Score: 5                    Medical Decision Making Patient is a 38 year old male with history of alcohol use, tobacco use, MI, hypertension, anxiety and disease on dialysis, schizophrenia, and homelessness presents to the ED for  continued chest tightness and cough.  Of note patient was seen last evening and admitted to hospitalist for chest pain workup but left AMA this morning as  he stated he needed to go get some food.  On exam patient is alert and in no acute distress.  Physical exam as noted above.  Chest x-ray from last evening reviewed and negative for acute process.  EKG today shows stable sinus rhythm, similar to EKG last evening.  CBC and CMP similar to patient's baseline as he is a dialysis patient.  Notes he should have dialysis tomorrow.  Initial troponin last evening 78, second troponin was 51.  Initial troponin today is 36.  Less concerns for acute STEMI or NSTEMI.  Suspect patient's troponins could be elevated secondary respiratory symptoms as he notes cough and chest tightness for a few days.  Otherwise his vital signs are stable in no acute distress.  Labs from yesterday did also reveal an elevated D-dimer this morning and patient has not had a CT angio of the chest yet.  Will plan for delta troponin, CT angio chest, and reevaluate, disposition pending. Patient signed out to [facility], PA-C at shift change.   Amount and/or Complexity of Data Reviewed Labs: ordered. Decision-making details documented in ED Course. Radiology:  ordered.      Final diagnoses:  SOB (shortness of breath)    ED Discharge Orders          Ordered    doxycycline  (VIBRAMYCIN ) 100 MG capsule  2 times daily,   Status:  Discontinued        05/12/24 2345    predniSONE  (DELTASONE ) 20 MG tablet  Daily,   Status:  Discontinued        05/12/24 2345               Neysa Thersia RAMAN, PA-C 05/12/24 2357    Rogelia Jerilynn RAMAN, MD 05/13/24 0001

## 2024-05-12 NOTE — ED Notes (Signed)
 Dr. Raenelle okay with pt signing out AMA.

## 2024-05-12 NOTE — ED Notes (Signed)
 AMA form signed by patient and uploaded to the patient's chart.

## 2024-05-13 ENCOUNTER — Emergency Department (HOSPITAL_COMMUNITY): Payer: MEDICAID

## 2024-05-13 LAB — TROPONIN I (HIGH SENSITIVITY): Troponin I (High Sensitivity): 43 ng/L — ABNORMAL HIGH (ref <18)

## 2024-05-13 MED ORDER — SODIUM CHLORIDE 0.9 % IV SOLN
1.0000 g | Freq: Once | INTRAVENOUS | Status: DC
  Filled 2024-05-13: qty 10

## 2024-05-13 MED ORDER — IOHEXOL 350 MG/ML SOLN
75.0000 mL | Freq: Once | INTRAVENOUS | Status: AC | PRN
  Administered 2024-05-13: 75 mL via INTRAVENOUS

## 2024-05-13 MED ORDER — DOXYCYCLINE HYCLATE 100 MG PO CAPS: 100.0000 mg | ORAL_CAPSULE | Freq: Two times a day (BID) | ORAL | 0 refills | Status: DC

## 2024-05-13 NOTE — ED Notes (Signed)
 Pt refusing vitals. Pt also refusing any kind of care from the ED. Pt stating that he would like to speak with his doctor. MD Palumbo at bedside. Pt states that that is not his doctor and that he will not speak with EDP. Pt educated about not being able to go to dialysis without updated vitals and 5 lead placed. Pt refusing to speak with ED RN and ED Tech.

## 2024-05-13 NOTE — ED Notes (Addendum)
 This RN went into pts room to place IV for PE scan and get troponin. Pt refused to have IV placed in right AC. This RN attempted to get right mid forearm and pt proceeded to yell at this RN to take IV out and refused to get another IV for scan and lab. EDP made aware.  Pt also refusing to be hooked up to monitor to obtain vitals.

## 2024-05-13 NOTE — ED Provider Notes (Signed)
 12:30 AM RN went to place peripheral IV and obtain delta troponin.  States that patient pulled his arm back and refused blood work and IV.  I went to speak with the patient and explained the need for further testing and CT imaging of his chest.  He is now amenable to PIV placement.  All questions answered.  1:34 AM Troponin persistently elevated; however, elevation is mild and stable compared to prior.   2:28 AM CTA completed for evaluation of chest pain complaints as there was an order placed for a D dimer while the patient was inpatient. This resulted minimally elevated at 0.53. CTA findings negative for large, central PE. My clinical suspicion for pulmonary embolus in this patient is low. He has no tachypnea, dyspnea, hypoxia, tachycardia. He is an ESRD patient. Suspect underlying comorbidities are contributing to this chronic troponin elevation.   Imaging c/w bronchopneumonia. Plan for Ig IV Rocephin  in the ED and discharge on oral antibiotics.   Vitals:   05/12/24 1900 05/12/24 2115 05/13/24 0028  BP: (!) 153/98 (!) 157/91   Pulse: 92 80   Resp: 18 (!) 22   Temp: (!) 97.4 F (36.3 C)  97.6 F (36.4 C)  TempSrc:   Oral  SpO2: 98% 100%       Keith Sor, PA-C 05/13/24 0241    Palumbo, April, MD 05/13/24 [phone]

## 2024-05-13 NOTE — ED Notes (Signed)
 Pt refusing to have IV taken out. Pt actively walking into hallways with plans to leave with IV still in place. Security Optometrist on standby in hallway where IV was removed by pt and bandaged before leaving.

## 2024-05-13 NOTE — ED Notes (Signed)
 Patient transported to CT

## 2024-05-13 NOTE — Discharge Instructions (Addendum)
 Your chest imaging was suspicious for bronchopneumonia.  You have been given a prescription for an antibiotic.  Take this as prescribed until finished.  Continue your dialysis as scheduled.  Follow-up with a primary care doctor to ensure resolution of symptoms.

## 2024-05-26 ENCOUNTER — Emergency Department (HOSPITAL_COMMUNITY)
Admission: EM | Admit: 2024-05-26 | Discharge: 2024-05-27 | Disposition: A | Discharge status: Home / Self Care | Payer: MEDICAID | Attending: Emergency Medicine | Admitting: Emergency Medicine

## 2024-05-26 ENCOUNTER — Emergency Department (HOSPITAL_COMMUNITY): Payer: MEDICAID

## 2024-05-26 ENCOUNTER — Other Ambulatory Visit: Payer: Self-pay

## 2024-05-26 DIAGNOSIS — Z87891 Personal history of nicotine dependence: Secondary | ICD-10-CM | POA: Diagnosis not present

## 2024-05-26 DIAGNOSIS — M7989 Other specified soft tissue disorders: Secondary | ICD-10-CM | POA: Diagnosis not present

## 2024-05-26 DIAGNOSIS — M79605 Pain in left leg: Secondary | ICD-10-CM | POA: Diagnosis present

## 2024-05-26 DIAGNOSIS — N189 Chronic kidney disease, unspecified: Secondary | ICD-10-CM | POA: Diagnosis not present
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
## 2024-05-27 ENCOUNTER — Emergency Department (HOSPITAL_COMMUNITY): Payer: MEDICAID

## 2024-05-27 DIAGNOSIS — M7989 Other specified soft tissue disorders: Secondary | ICD-10-CM | POA: Diagnosis not present

## 2024-05-27 LAB — CBC
HCT: 31.3 % — ABNORMAL LOW (ref 39.0–52.0)
Hemoglobin: 9.9 g/dL — ABNORMAL LOW (ref 13.0–17.0)
MCH: 30.6 pg (ref 26.0–34.0)
MCHC: 31.6 g/dL (ref 30.0–36.0)
MCV: 96.6 fL (ref 80.0–100.0)
Platelets: 282 K/uL (ref 150–400)
RBC: 3.24 MIL/uL — ABNORMAL LOW (ref 4.22–5.81)
RDW: 14.6 % (ref 11.5–15.5)
WBC: 8.7 K/uL (ref 4.0–10.5)
nRBC: 0 % (ref 0.0–0.2)

## 2024-05-27 LAB — BASIC METABOLIC PANEL WITH GFR
Anion gap: 19 — ABNORMAL HIGH (ref 5–15)
BUN: 85 mg/dL — ABNORMAL HIGH (ref 6–20)
CO2: 17 mmol/L — ABNORMAL LOW (ref 22–32)
Calcium: 8.6 mg/dL — ABNORMAL LOW (ref 8.9–10.3)
Chloride: 104 mmol/L (ref 98–111)
Creatinine, Ser: 13.5 mg/dL — ABNORMAL HIGH (ref 0.61–1.24)
GFR, Estimated: 4 mL/min — ABNORMAL LOW
Glucose, Bld: 80 mg/dL (ref 70–99)
Potassium: 5 mmol/L (ref 3.5–5.1)
Sodium: 141 mmol/L (ref 135–145)

## 2024-05-27 MED ORDER — ACETAMINOPHEN 500 MG PO TABS
1000.0000 mg | ORAL_TABLET | ORAL | Status: AC
  Administered 2024-05-27: 1000 mg via ORAL
  Filled 2024-05-27: qty 2

## 2024-05-27 NOTE — ED Notes (Signed)
 Patient returned from Ultrasound.

## 2024-05-27 NOTE — ED Notes (Signed)
 Pt pitched a fit about wanting dialysis. Yelling, cursing. EDP recommended that he get his dialysis that is scheduled today.  Pt left without his paperwork, bus pass, food and hat and socks.  All were placed in belonging bag and sent with security.

## 2024-05-27 NOTE — ED Notes (Signed)
 PT resting, calm and cooperative at this time

## 2024-05-27 NOTE — Progress Notes (Signed)
 Left lower extremity venous duplex has been completed. Preliminary results can be found in CV Proc through chart review.  Results were given to [facility] PA.  05/27/2024 10:49 AM Cathlyn Collet RVT

## 2024-05-27 NOTE — ED Provider Notes (Signed)
 " [place] [facility] Provider Note   CSN: [id number] Arrival date & time: 05/26/24  2325     Patient presents with: No chief complaint on file.   William Gregory is a 38 y.o. male.   HPI  Patient is a 38 year old male with a past medical history significant for CKD, reflux, depression, alcohol abuse, tobacco abuse  Patient presents emergency room today with complaints of left leg pain.  Initially in triage it seems that he was describing bilateral leg pain but for me he tells me that he is having only pain behind his left knee and some pain in his left calf.  Has been ongoing for 3 or 4 days. Denies any chest pain or difficulty breathing.  No nausea or vomiting no hemoptysis.     Prior to Admission medications  Medication Sig Start Date End Date Taking? Authorizing Provider  acetaminophen  (TYLENOL ) 500 MG tablet Take 500 mg by mouth every 6 (six) hours as needed for moderate pain.    [provider]  amLODipine  (NORVASC ) 5 MG tablet Take 1 tablet (5 mg total) by mouth daily. 04/14/24   Paseda, Folashade R, FNP  benztropine  (COGENTIN ) 1 MG tablet Take 1 mg by mouth 2 (two) times daily.    [provider]  cinacalcet  (SENSIPAR ) 30 MG tablet Take 1 tablet (30 mg total) by mouth every evening. 11/16/23   Paseda, Folashade R, FNP  citalopram  (CELEXA ) 20 MG tablet Take 1 tablet (20 mg total) by mouth every morning. 11/16/23   Paseda, Folashade R, FNP  doxycycline  (VIBRAMYCIN ) 100 MG capsule Take 1 capsule (100 mg total) by mouth 2 (two) times daily. 05/13/24   Keith Sor, PA-C  ibuprofen  (ADVIL ) 600 MG tablet Take 1 tablet (600 mg total) by mouth every 8 (eight) hours as needed (severe pain 7-10). 10/23/23   Paseda, Folashade R, FNP  meclizine  (ANTIVERT ) 25 MG tablet Take 1 tablet (25 mg total) by mouth 3 (three) times daily as needed for dizziness. 04/30/24   Vicky Charleston, PA-C  sevelamer carbonate (RENVELA) 800 MG tablet Take  2,400 mg by mouth 3 (three) times daily with meals.    [provider]  valbenazine  (INGREZZA ) 80 MG capsule Samples of this drug were given to the patient, quantity 1, Lot Number [id number] Patient taking differently: Take 80 mg by mouth daily. 11/27/22   Tat, Asberry RAMAN, DO    Allergies: Aspirin and Ibuprofen     Review of Systems  Updated Vital Signs BP (!) 157/107 (BP Location: Right Arm)   Pulse 80   Temp 98.3 F (36.8 C)   Resp 18   SpO2 100%   Physical Exam Vitals and nursing note reviewed.  Constitutional:      General: He is not in acute distress. HENT:     Head: Normocephalic and atraumatic.     Nose: Nose normal.  Eyes:     General: No scleral icterus. Cardiovascular:     Rate and Rhythm: Normal rate and regular rhythm.     Pulses: Normal pulses.     Heart sounds: Normal heart sounds.     Comments: DP PT pulses symmetric sensation in bilateral lower extremities symmetric Pulmonary:     Effort: Pulmonary effort is normal. No respiratory distress.     Breath sounds: No wheezing.  Abdominal:     Palpations: Abdomen is soft.     Tenderness: There is no abdominal tenderness.  Musculoskeletal:     Cervical back: Normal  range of motion.     Right lower leg: No edema.     Left lower leg: No edema.     Comments: Tenderness to palpation of left calf.  No significant swelling.  No erythema  Skin:    General: Skin is warm and dry.     Capillary Refill: Capillary refill takes less than 2 seconds.  Neurological:     Mental Status: He is alert. Mental status is at baseline.  Psychiatric:        Mood and Affect: Mood normal.        Behavior: Behavior normal.     (all labs ordered are listed, but only abnormal results are displayed) Labs Reviewed  CBC - Abnormal; Notable for the following components:      Result Value   RBC 3.24 (*)    Hemoglobin 9.9 (*)    HCT 31.3 (*)    All other components within normal limits  BASIC METABOLIC PANEL WITH GFR - Abnormal;  Notable for the following components:   CO2 17 (*)    BUN 85 (*)    Creatinine, Ser 13.50 (*)    Calcium 8.6 (*)    GFR, Estimated 4 (*)    Anion gap 19 (*)    All other components within normal limits    EKG: None  Radiology: VAS US  LOWER EXTREMITY VENOUS (DVT) (7a-5p) Result Date: 05/27/2024  Lower Venous DVT Study Patient Name:  William Gregory  Date of Exam:   05/27/2024 Medical Rec #: [id number]            Accession #:    [id number] Date of Birth: Jul 04, 1985            Patient Gender: M Patient Age:   80 years Exam Location:  [facility] Procedure:      VAS US  LOWER EXTREMITY VENOUS (DVT) Referring Phys: William Gregory --------------------------------------------------------------------------------  Indications: Pain.  Risk Factors: Trauma. Limitations: Poor ultrasound/tissue interface and patient positioning, poor patient cooperation, irritability. Comparison Study: 05/12/2024 - Negative for DVT bilaterally Performing Technologist: Cordella Collet RVT  Examination Guidelines: A complete evaluation includes B-mode imaging, spectral Doppler, color Doppler, and power Doppler as needed of all accessible portions of each vessel. Bilateral testing is considered an integral part of a complete examination. Limited examinations for reoccurring indications may be performed as noted. The reflux portion of the exam is performed with the patient in reverse Trendelenburg.  +-----+---------------+---------+-----------+----------+--------------+ RIGHTCompressibilityPhasicitySpontaneityPropertiesThrombus Aging +-----+---------------+---------+-----------+----------+--------------+ CFV                                               Refused        +-----+---------------+---------+-----------+----------+--------------+   +---------+---------------+---------+-----------+----------+--------------+ LEFT     CompressibilityPhasicitySpontaneityPropertiesThrombus Aging  +---------+---------------+---------+-----------+----------+--------------+ CFV      Full           Yes      No                                  +---------+---------------+---------+-----------+----------+--------------+ SFJ      Full                                                        +---------+---------------+---------+-----------+----------+--------------+  FV Prox  Full                                                        +---------+---------------+---------+-----------+----------+--------------+ FV Mid   Full                                                        +---------+---------------+---------+-----------+----------+--------------+ FV DistalFull                                                        +---------+---------------+---------+-----------+----------+--------------+ PFV      Full                                                        +---------+---------------+---------+-----------+----------+--------------+ POP      Full           Yes      No                                  +---------+---------------+---------+-----------+----------+--------------+ PTV      Full                                                        +---------+---------------+---------+-----------+----------+--------------+ PERO     Full                                                        +---------+---------------+---------+-----------+----------+--------------+    Summary: LEFT: - There is no evidence of deep vein thrombosis in the lower extremity.  - No cystic structure found in the popliteal fossa.  *See table(s) above for measurements and observations. Electronically signed by Lonni Gaskins MD on 05/27/2024 at 5:00:00 PM.    Final    DG Knee Complete 4 Views Left Result Date: 05/27/2024 CLINICAL DATA:  Leg pain. EXAM: LEFT KNEE - COMPLETE 4+ VIEW COMPARISON:  Fever radiograph 04/12/2024 FINDINGS: No evidence of fracture, dislocation, or joint  effusion. No evidence of arthropathy or other focal bone abnormality. Generalized soft tissue edema. IMPRESSION: Generalized soft tissue edema. No acute osseous abnormality. Electronically Signed   By: Andrea Gasman M.D.   On: 05/27/2024 00:17   DG Knee Complete 4 Views Right Result Date: 05/27/2024 CLINICAL DATA:  Leg pain EXAM: RIGHT KNEE - COMPLETE 4+ VIEW COMPARISON:  Tibia/fibula exam 01/11/2024 FINDINGS: No evidence of fracture, dislocation, or joint effusion. Chronic fragmentation of the anterior tibial tubercle. No evidence of arthropathy or  other focal bone abnormality. Mild soft tissue edema. IMPRESSION: Mild soft tissue edema. No acute osseous abnormality. Electronically Signed   By: Andrea Gasman M.D.   On: 05/27/2024 00:16   DG Pelvis Portable Result Date: 05/27/2024 CLINICAL DATA:  Hip pain. EXAM: PORTABLE PELVIS 1-2 VIEWS COMPARISON:  Pelvis radiograph 04/12/2024 FINDINGS: The cortical margins of the bony pelvis are intact. No fracture. Pubic symphysis and sacroiliac joints are congruent. Both femoral heads are well-seated in the respective acetabula. No evidence of a vascular necrosis or focal bone abnormality. IMPRESSION: Negative radiograph of the pelvis. Electronically Signed   By: Andrea Gasman M.D.   On: 05/27/2024 00:15     Procedures   Medications Ordered in the ED  acetaminophen  (TYLENOL ) tablet 1,000 mg (1,000 mg Oral Given 05/27/24 0709)                                    Medical Decision Making Risk OTC drugs.   Patient is a 38 year old male with a past medical history significant for CKD, reflux, depression, alcohol abuse, tobacco abuse  Patient presents emergency room today with complaints of left leg pain.  Initially in triage it seems that he was describing bilateral leg pain but for me he tells me that he is having only pain behind his left knee and some pain in his left calf.  Has been ongoing for 3 or 4 days. Denies any chest pain or difficulty  breathing.  No nausea or vomiting no hemoptysis.  X-rays of bilateral knees and pelvis unremarkable DVT study negative labs consistent with CKD.  Patient has dialysis appointment today.  And recommend that he does not miss dialysis today.  Return precautions emergency room provided.    Final diagnoses:  Left leg pain    ED Discharge Orders     None          Neldon William RAMAN, [place] 05/28/24 1215    Garrick Charleston, MD 05/28/24 1540  "

## 2024-05-27 NOTE — ED Notes (Signed)
 Patient transported to Ultrasound

## 2024-05-27 NOTE — Discharge Instructions (Addendum)
 Your ultrasound was negative for a blood clot.  It is possible you have a muscle strain.  I recommend warm compresses Tylenol  1000 mg every 6 hours for pain. Please attend your scheduled dialysis today. Please follow-up with the [facility] health wellness clinic

## 2024-05-27 NOTE — ED Notes (Signed)
 Pt is somewhat irritable.  He thinks little white flakes are coming off of his ear.  He is talking to himself and occasionally cursing.  Pt has been fed and given Tylenol  for his leg pain. Awaiting US .

## 2024-05-29 ENCOUNTER — Other Ambulatory Visit: Payer: Self-pay

## 2024-05-29 ENCOUNTER — Emergency Department (HOSPITAL_COMMUNITY): Payer: MEDICAID

## 2024-05-29 ENCOUNTER — Emergency Department (HOSPITAL_COMMUNITY)
Admission: EM | Admit: 2024-05-29 | Discharge: 2024-05-30 | Disposition: A | Discharge status: Home / Self Care | Payer: MEDICAID | Attending: Emergency Medicine | Admitting: Emergency Medicine

## 2024-05-29 ENCOUNTER — Encounter (HOSPITAL_COMMUNITY): Payer: Self-pay

## 2024-05-29 DIAGNOSIS — E872 Acidosis, unspecified: Secondary | ICD-10-CM | POA: Insufficient documentation

## 2024-05-29 DIAGNOSIS — Z79899 Other long term (current) drug therapy: Secondary | ICD-10-CM | POA: Insufficient documentation

## 2024-05-29 DIAGNOSIS — J101 Influenza due to other identified influenza virus with other respiratory manifestations: Secondary | ICD-10-CM | POA: Diagnosis not present

## 2024-05-29 DIAGNOSIS — J020 Streptococcal pharyngitis: Secondary | ICD-10-CM | POA: Insufficient documentation

## 2024-05-29 DIAGNOSIS — N186 End stage renal disease: Secondary | ICD-10-CM | POA: Insufficient documentation

## 2024-05-29 DIAGNOSIS — Z992 Dependence on renal dialysis: Secondary | ICD-10-CM | POA: Insufficient documentation

## 2024-05-29 DIAGNOSIS — J45909 Unspecified asthma, uncomplicated: Secondary | ICD-10-CM | POA: Diagnosis not present

## 2024-05-29 DIAGNOSIS — R509 Fever, unspecified: Secondary | ICD-10-CM | POA: Diagnosis present

## 2024-05-29 DIAGNOSIS — I120 Hypertensive chronic kidney disease with stage 5 chronic kidney disease or end stage renal disease: Secondary | ICD-10-CM | POA: Diagnosis not present

## 2024-05-29 LAB — COMPREHENSIVE METABOLIC PANEL WITH GFR
ALT: 19 U/L (ref 0–44)
AST: 24 U/L (ref 15–41)
Albumin: 3.8 g/dL (ref 3.5–5.0)
Alkaline Phosphatase: 92 U/L (ref 38–126)
Anion gap: 18 — ABNORMAL HIGH (ref 5–15)
BUN: 80 mg/dL — ABNORMAL HIGH (ref 6–20)
CO2: 16 mmol/L — ABNORMAL LOW (ref 22–32)
Calcium: 8.3 mg/dL — ABNORMAL LOW (ref 8.9–10.3)
Chloride: 105 mmol/L (ref 98–111)
Creatinine, Ser: 14.5 mg/dL — ABNORMAL HIGH (ref 0.61–1.24)
GFR, Estimated: 4 mL/min — ABNORMAL LOW
Glucose, Bld: 92 mg/dL (ref 70–99)
Potassium: 4.8 mmol/L (ref 3.5–5.1)
Sodium: 139 mmol/L (ref 135–145)
Total Bilirubin: 0.4 mg/dL (ref 0.0–1.2)
Total Protein: 6.6 g/dL (ref 6.5–8.1)

## 2024-05-29 LAB — CBC WITH DIFFERENTIAL/PLATELET
Abs Immature Granulocytes: 0.02 K/uL (ref 0.00–0.07)
Basophils Absolute: 0 K/uL (ref 0.0–0.1)
Basophils Relative: 1 %
Eosinophils Absolute: 0.1 K/uL (ref 0.0–0.5)
Eosinophils Relative: 1 %
HCT: 29.2 % — ABNORMAL LOW (ref 39.0–52.0)
Hemoglobin: 9.3 g/dL — ABNORMAL LOW (ref 13.0–17.0)
Immature Granulocytes: 0 %
Lymphocytes Relative: 8 %
Lymphs Abs: 0.4 K/uL — ABNORMAL LOW (ref 0.7–4.0)
MCH: 30.3 pg (ref 26.0–34.0)
MCHC: 31.8 g/dL (ref 30.0–36.0)
MCV: 95.1 fL (ref 80.0–100.0)
Monocytes Absolute: 1.1 K/uL — ABNORMAL HIGH (ref 0.1–1.0)
Monocytes Relative: 19 %
Neutro Abs: 4.1 K/uL (ref 1.7–7.7)
Neutrophils Relative %: 71 %
Platelets: 221 K/uL (ref 150–400)
RBC: 3.07 MIL/uL — ABNORMAL LOW (ref 4.22–5.81)
RDW: 14.7 % (ref 11.5–15.5)
WBC: 5.8 K/uL (ref 4.0–10.5)
nRBC: 0 % (ref 0.0–0.2)

## 2024-05-29 LAB — I-STAT CHEM 8, ED
BUN: 81 mg/dL — ABNORMAL HIGH (ref 6–20)
Calcium, Ion: 1.05 mmol/L — ABNORMAL LOW (ref 1.15–1.40)
Chloride: 111 mmol/L (ref 98–111)
Creatinine, Ser: 15.8 mg/dL — ABNORMAL HIGH (ref 0.61–1.24)
Glucose, Bld: 94 mg/dL (ref 70–99)
HCT: 28 % — ABNORMAL LOW (ref 39.0–52.0)
Hemoglobin: 9.5 g/dL — ABNORMAL LOW (ref 13.0–17.0)
Potassium: 4.7 mmol/L (ref 3.5–5.1)
Sodium: 139 mmol/L (ref 135–145)
TCO2: 16 mmol/L — ABNORMAL LOW (ref 22–32)

## 2024-05-29 LAB — I-STAT CG4 LACTIC ACID, ED: Lactic Acid, Venous: 1 mmol/L (ref 0.5–1.9)

## 2024-05-29 LAB — RESP PANEL BY RT-PCR (RSV, FLU A&B, COVID)  RVPGX2
Influenza A by PCR: POSITIVE — AB
Influenza B by PCR: NEGATIVE
Resp Syncytial Virus by PCR: NEGATIVE
SARS Coronavirus 2 by RT PCR: NEGATIVE

## 2024-05-29 LAB — GROUP A STREP BY PCR: Group A Strep by PCR: DETECTED — AB

## 2024-05-29 MED ORDER — ACETAMINOPHEN 325 MG PO TABS
650.0000 mg | ORAL_TABLET | Freq: Once | ORAL | Status: AC | PRN
  Administered 2024-05-29: 650 mg via ORAL
  Filled 2024-05-29: qty 2

## 2024-05-29 NOTE — ED Provider Notes (Signed)
 " [place] [facility] AT [facility] Provider Note   CSN: [id number] Arrival date & time: 05/29/24  8086     Patient presents with: Fever and Leg Pain   William Gregory is a 38 y.o. male.  {Add pertinent medical, surgical, social history, OB history to HPI:32947} HPI      Last dialysis was Saturday Leg started hurting 3 days ago No numbness Shortness of breath began 5 days ago Nausea and vomited this AM Fever started tonight-not sure temp Cough few days Runny nose for a few days No abdominal pain Diarrhea x1 LLE swelling and pain for 4 days    Prior to Admission medications  Medication Sig Start Date End Date Taking? Authorizing Provider  acetaminophen  (TYLENOL ) 500 MG tablet Take 500 mg by mouth every 6 (six) hours as needed for moderate pain.    [provider]  amLODipine  (NORVASC ) 5 MG tablet Take 1 tablet (5 mg total) by mouth daily. 04/14/24   Paseda, Folashade R, FNP  benztropine  (COGENTIN ) 1 MG tablet Take 1 mg by mouth 2 (two) times daily.    [provider]  cinacalcet  (SENSIPAR ) 30 MG tablet Take 1 tablet (30 mg total) by mouth every evening. 11/16/23   Paseda, Folashade R, FNP  citalopram  (CELEXA ) 20 MG tablet Take 1 tablet (20 mg total) by mouth every morning. 11/16/23   Paseda, Folashade R, FNP  doxycycline  (VIBRAMYCIN ) 100 MG capsule Take 1 capsule (100 mg total) by mouth 2 (two) times daily. 05/13/24   Keith Sor, PA-C  ibuprofen  (ADVIL ) 600 MG tablet Take 1 tablet (600 mg total) by mouth every 8 (eight) hours as needed (severe pain 7-10). 10/23/23   Paseda, Folashade R, FNP  meclizine  (ANTIVERT ) 25 MG tablet Take 1 tablet (25 mg total) by mouth 3 (three) times daily as needed for dizziness. 04/30/24   Vicky Charleston, PA-C  sevelamer carbonate (RENVELA) 800 MG tablet Take 2,400 mg by mouth 3 (three) times daily with meals.    [provider]  valbenazine  (INGREZZA ) 80 MG capsule Samples of this drug were  given to the patient, quantity 1, Lot Number [id number] Patient taking differently: Take 80 mg by mouth daily. 11/27/22   Tat, Asberry RAMAN, DO    Allergies: Aspirin and Ibuprofen     Review of Systems  Updated Vital Signs BP (!) 175/70   Pulse 94   Temp 99.3 F (37.4 C) (Oral)   Resp 18   SpO2 95%   Physical Exam  (all labs ordered are listed, but only abnormal results are displayed) Labs Reviewed  GROUP A STREP BY PCR  RESP PANEL BY RT-PCR (RSV, FLU A&B, COVID)  RVPGX2  CBC WITH DIFFERENTIAL/PLATELET  COMPREHENSIVE METABOLIC PANEL WITH GFR  I-STAT CHEM 8, ED    EKG: None  Radiology: No results found.  {Document cardiac monitor, telemetry assessment procedure when appropriate:32947} Procedures   Medications Ordered in the ED  acetaminophen  (TYLENOL ) tablet 650 mg (650 mg Oral Given 05/29/24 1923)      {Click here for ABCD2, HEART and other calculators REFRESH Note before signing:1}                              Medical Decision Making Amount and/or Complexity of Data Reviewed Labs: ordered.   ***  {Document critical care time when appropriate  Document review of labs and clinical decision tools ie CHADS2VASC2, etc  Document your independent review of radiology  images and any outside records  Document your discussion with family members, caretakers and with consultants  Document social determinants of health affecting pt's care  Document your decision making why or why not admission, treatments were needed:32947:::1}   Final diagnoses:  None    ED Discharge Orders     None        "

## 2024-05-29 NOTE — ED Triage Notes (Signed)
 EMS called out for left leg pain. When EMS tried to examine him he swung at the EMS provider. Patient missed dialysis (normally M, W, F). Patient has a fistula in the left arm. EMS reports fever of 102.4 with cough.

## 2024-05-30 MED ORDER — AMOXICILLIN 500 MG PO CAPS
500.0000 mg | ORAL_CAPSULE | ORAL | Status: DC
  Administered 2024-05-30: 500 mg via ORAL
  Filled 2024-05-30: qty 1

## 2024-05-30 MED ORDER — AMOXICILLIN 500 MG PO CAPS: 500.0000 mg | ORAL_CAPSULE | ORAL | 0 refills | Status: AC

## 2024-05-31 ENCOUNTER — Other Ambulatory Visit: Payer: Self-pay

## 2024-05-31 ENCOUNTER — Emergency Department (HOSPITAL_COMMUNITY): Payer: MEDICAID

## 2024-05-31 ENCOUNTER — Emergency Department (HOSPITAL_COMMUNITY)
Admission: EM | Admit: 2024-05-31 | Discharge: 2024-05-31 | Discharge status: Against Medical Advice | Payer: MEDICAID | Attending: Emergency Medicine | Admitting: Emergency Medicine

## 2024-05-31 ENCOUNTER — Encounter (HOSPITAL_COMMUNITY): Payer: Self-pay

## 2024-05-31 DIAGNOSIS — R0602 Shortness of breath: Secondary | ICD-10-CM | POA: Diagnosis not present

## 2024-05-31 DIAGNOSIS — R600 Localized edema: Secondary | ICD-10-CM | POA: Insufficient documentation

## 2024-05-31 DIAGNOSIS — R079 Chest pain, unspecified: Secondary | ICD-10-CM | POA: Diagnosis present

## 2024-05-31 DIAGNOSIS — N186 End stage renal disease: Secondary | ICD-10-CM | POA: Diagnosis not present

## 2024-05-31 DIAGNOSIS — Z992 Dependence on renal dialysis: Secondary | ICD-10-CM | POA: Insufficient documentation

## 2024-05-31 LAB — CBC
HCT: 31.4 % — ABNORMAL LOW (ref 39.0–52.0)
Hemoglobin: 10 g/dL — ABNORMAL LOW (ref 13.0–17.0)
MCH: 30.5 pg (ref 26.0–34.0)
MCHC: 31.8 g/dL (ref 30.0–36.0)
MCV: 95.7 fL (ref 80.0–100.0)
Platelets: 218 K/uL (ref 150–400)
RBC: 3.28 MIL/uL — ABNORMAL LOW (ref 4.22–5.81)
RDW: 14.8 % (ref 11.5–15.5)
WBC: 4.1 K/uL (ref 4.0–10.5)
nRBC: 0 % (ref 0.0–0.2)

## 2024-05-31 LAB — BASIC METABOLIC PANEL WITH GFR
Anion gap: 16 — ABNORMAL HIGH (ref 5–15)
BUN: 80 mg/dL — ABNORMAL HIGH (ref 6–20)
CO2: 15 mmol/L — ABNORMAL LOW (ref 22–32)
Calcium: 8.6 mg/dL — ABNORMAL LOW (ref 8.9–10.3)
Chloride: 106 mmol/L (ref 98–111)
Creatinine, Ser: 14.8 mg/dL — ABNORMAL HIGH (ref 0.61–1.24)
GFR, Estimated: 4 mL/min — ABNORMAL LOW
Glucose, Bld: 92 mg/dL (ref 70–99)
Potassium: 5 mmol/L (ref 3.5–5.1)
Sodium: 137 mmol/L (ref 135–145)

## 2024-05-31 LAB — TROPONIN T, HIGH SENSITIVITY
Troponin T High Sensitivity: 124 ng/L (ref 0–19)
Troponin T High Sensitivity: 124 ng/L (ref 0–19)

## 2024-05-31 MED ORDER — CHLORHEXIDINE GLUCONATE CLOTH 2 % EX PADS: 6.0000 | MEDICATED_PAD | Freq: Every day | CUTANEOUS | Status: DC

## 2024-05-31 NOTE — ED Triage Notes (Signed)
 Complaining of chest pain for the two days. Has not had dialysis since last week. He is alert and oriented x 4.

## 2024-05-31 NOTE — ED Notes (Signed)
 Patient continues to pull off all leads and BP cuff. Replaced them and he has pulled them off again.

## 2024-05-31 NOTE — ED Provider Notes (Cosign Needed Addendum)
" ° ° °  Accepted handoff at shift change from Sponseller PA-C. Please see prior provider note for more detail.   Briefly: Patient is 38 y.o.  concern for 2 days of chest pain and shortness of breath that is worse with exertion or when lying flat.  He states he has not had dialysis in 7 days.  Was seen here 48 hours ago diagnosed with influenza A.  States he did not present for dialysis as scheduled at that time due to lack of bus fare.  Patient is well-known to this department.  ESRD, on MWF, schizophrenia recent flu diagnosis on 05/29/24.   Plan:  - dispo pending nephrology consultation and dialysis. I expect that patient's symptoms are related to fluid overload from lack of dialysis x7 days. - patient resting in ED bed on my evaluation. NAD.  - consulted with nephrologist on-call Dr. Melia who agrees to evaluate patient for dialysis. - If patient's symptoms resolve well after dialysis, I expect that he should be appropriate for ED discharge and outpatient follow up with PCP.        Hoy Nidia FALCON, [place] 05/31/24 0745  Patient eloped. RN telling me that he walked by the desk and said that he is leaving and was seen walking out of the ED. I was not able to see patient before he left.    Hoy Nidia FALCON, [place] 05/31/24 1030  "

## 2024-05-31 NOTE — Consult Note (Signed)
 Reason for Consult: ESRD Referring Physician: Nidia Mays  Chief Complaint: SOB  OP HD: MWF [facility]   4h   B400  79.8 kg  2K bath  lt BCF Heparin  3400  Last tx on 12/17 leaving early at 84kg  Assessment/Plan: Chest pain: admitted for r/o ACS. Cardiology consulted.  ESRD: on HD TTS. Per pt last OP HD was 12/17 and he signed off early as well. Current EDW listed as 79.8kg.  Additional recommendations - Dose all meds for creatinine clearance < 10 ml/min  - Unless absolutely necessary, no MRIs with gadolinium.  - Prefer needle sticks in the dorsum of the hands or wrists.  No blood pressure measurements in right arm. - If blood transfusion is requested during hemodialysis sessions, please alert us  prior to the session.   Avoid nephrotoxic medications including NSAIDs and iodinated intravenous contrast exposure unless the latter is absolutely indicated.  Preferred narcotic agents for pain control are hydromorphone , fentanyl , and methadone. Morphine  should not be used. Avoid Baclofen and avoid oral sodium phosphate  and magnesium citrate based laxatives / bowel preps. Continue strict Input and Output monitoring. Will monitor the patient closely with you and intervene or adjust therapy as indicated by changes in clinical status/labs    HTN: Blood pressures slightly high, follow. Volume: Max UF 3-4 liters with HD. 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   HPI: William Gregory is an 38 y.o. male with EOH abuse, LBP, depression, ESRD patient who misses treatments routinely citing issues with transportation and not being given bus passes. Here's here with SOB, cough, last treatment on 12/17. Also signs off early routinely and he left at 84kg which is 4kg above his EDW. Here with 2 days of chest pain, SOB, DOE, orthopnea, last seen here 48hrs ago with influenza A; brother was sick before him.   ROS Pertinent items are noted in HPI.  Chemistry and CBC: Creatinine, Ser  Date/Time Value Ref Range  Status  05/31/2024 01:05 AM 14.80 (H) 0.61 - 1.24 mg/dL Final  87/74/7974 91:50 PM 15.80 (H) 0.61 - 1.24 mg/dL Final  87/74/7974 91:73 PM 14.50 (H) 0.61 - 1.24 mg/dL Final  87/77/7974 88:58 PM 13.50 (H) 0.61 - 1.24 mg/dL Final  87/91/7974 92:76 PM 12.86 (H) 0.61 - 1.24 mg/dL Final  87/91/7974 87:64 AM 12.50 (H) 0.61 - 1.24 mg/dL Final  87/91/7974 87:93 AM 12.25 (H) 0.61 - 1.24 mg/dL Final  87/98/7974 90:61 PM 13.07 (H) 0.61 - 1.24 mg/dL Final  88/74/7974 88:46 PM 11.59 (H) 0.61 - 1.24 mg/dL Final  88/75/7974 90:90 PM 13.95 (H) 0.61 - 1.24 mg/dL Final  88/77/7974 89:80 PM 14.13 (H) 0.61 - 1.24 mg/dL Final  88/79/7974 96:72 PM 13.72 (H) 0.61 - 1.24 mg/dL Final  88/91/7974 89:89 AM 14.00 (H) 0.61 - 1.24 mg/dL Final  88/91/7974 89:97 AM 13.19 (H) 0.61 - 1.24 mg/dL Final  89/78/7974 92:72 PM 13.76 (H) 0.61 - 1.24 mg/dL Final  89/79/7974 90:81 PM 13.30 (H) 0.61 - 1.24 mg/dL Final  89/81/7974 91:46 PM 14.09 (H) 0.61 - 1.24 mg/dL Final  93/90/7974 88:47 AM 13.74 (H) 0.61 - 1.24 mg/dL Final  96/70/7974 91:86 AM 11.43 (H) 0.61 - 1.24 mg/dL Final  96/71/7974 95:74 PM 11.35 (H) 0.61 - 1.24 mg/dL Final  96/71/7974 94:79 AM 10.81 (H) 0.61 - 1.24 mg/dL Final  97/72/7974 95:88 AM 13.50 (H) 0.61 - 1.24 mg/dL Final  97/72/7974 95:95 AM 11.70 (H) 0.61 - 1.24 mg/dL Final  90/69/7975 87:82 AM 10.53 (H) 0.61 - 1.24 mg/dL Final  07/19/2022 07:22 AM 10.00 (H) 0.61 - 1.24 mg/dL Final  98/78/7975 98:50 PM 9.36 (H) 0.61 - 1.24 mg/dL Final  87/91/7976 97:61 PM 10.51 (H) 0.76 - 1.27 mg/dL Final    Comment:    **Verified by repeat analysis**  04/04/2022 04:28 AM 8.74 (H) 0.61 - 1.24 mg/dL Final  89/69/7976 96:53 AM 9.85 (H) 0.61 - 1.24 mg/dL Final  89/70/7976 96:50 AM 9.27 (H) 0.61 - 1.24 mg/dL Final  89/71/7976 97:79 AM 11.27 (H) 0.61 - 1.24 mg/dL Final  89/72/7976 95:79 AM 10.59 (H) 0.61 - 1.24 mg/dL Final  89/73/7976 96:51 PM 10.87 (H) 0.61 - 1.24 mg/dL Final  89/74/7976 89:48 AM 10.03 (H) 0.76 - 1.27 mg/dL  Final    Comment:    **Verified by repeat analysis**  03/21/2022 12:19 AM 9.47 (H) 0.61 - 1.24 mg/dL Final  89/83/7976 94:58 PM 0.80 0.61 - 1.24 mg/dL Final  89/83/7976 94:81 PM 10.27 (H) 0.61 - 1.24 mg/dL Final  89/83/7976 89:45 AM 9.54 (HH) 0.40 - 1.50 mg/dL Final  88/73/7980 98:95 AM 2.90 (H) 0.61 - 1.24 mg/dL Final  88/80/7980 95:88 AM 3.06 (H) 0.61 - 1.24 mg/dL Final  88/82/7980 92:66 PM 2.96 (H) 0.61 - 1.24 mg/dL Final  87/69/7981 90:82 PM 2.80 (H) 0.61 - 1.24 mg/dL Final  87/90/7981 97:41 PM 2.79 (H) 0.61 - 1.24 mg/dL Final  87/96/7981 89:77 PM 2.95 (H) 0.61 - 1.24 mg/dL Final  87/98/7982 95:40 AM 2.63 (H) 0.61 - 1.24 mg/dL Final  91/91/7982 87:75 AM 2.20 (H) 0.61 - 1.24 mg/dL Final  92/68/7982 90:44 PM 2.30 (H) 0.61 - 1.24 mg/dL Final  92/74/7982 92:96 PM 2.37 (H) 0.61 - 1.24 mg/dL Final  92/93/7982 89:46 PM 2.67 (H) 0.61 - 1.24 mg/dL Final  96/83/7982 93:40 PM 2.36 (H) 0.61 - 1.24 mg/dL Final  96/85/7982 90:59 AM 2.27 (H) 0.61 - 1.24 mg/dL Final  96/88/7982 95:80 AM 2.26 (H) 0.61 - 1.24 mg/dL Final   Recent Labs  Lab 05/26/24 2341 05/29/24 2026 05/29/24 2049 05/31/24 0105  NA 141 139 139 137  K 5.0 4.8 4.7 5.0  CL 104 105 111 106  CO2 17* 16*  --  15*  GLUCOSE 80 92 94 92  BUN 85* 80* 81* 80*  CREATININE 13.50* 14.50* 15.80* 14.80*  CALCIUM 8.6* 8.3*  --  8.6*   Recent Labs  Lab 05/26/24 2341 05/29/24 2026 05/29/24 2049 05/31/24 0105  WBC 8.7 5.8  --  4.1  NEUTROABS  --  4.1  --   --   HGB 9.9* 9.3* 9.5* 10.0*  HCT 31.3* 29.2* 28.0* 31.4*  MCV 96.6 95.1  --  95.7  PLT 282 221  --  218   Liver Function Tests: Recent Labs  Lab 05/29/24 2026  AST 24  ALT 19  ALKPHOS 92  BILITOT 0.4  PROT 6.6  ALBUMIN 3.8   No results for input(s): LIPASE, AMYLASE in the last 168 hours. No results for input(s): AMMONIA in the last 168 hours. Cardiac Enzymes: No results for input(s): CKTOTAL, CKMB, CKMBINDEX, TROPONINI in the last 168 hours. Iron   Studies: No results for input(s): IRON , TIBC, TRANSFERRIN, FERRITIN in the last 72 hours. PT/INR: @LABRCNTIP (inr:5)  Xrays/Other Studies: ) Results for orders placed or performed during the hospital encounter of 05/31/24 (from the past 48 hours)  Basic metabolic panel     Status: Abnormal   Collection Time: 05/31/24  1:05 AM  Result Value Ref Range   Sodium 137 135 - 145 mmol/L   Potassium 5.0 3.5 -  5.1 mmol/L   Chloride 106 98 - 111 mmol/L   CO2 15 (L) 22 - 32 mmol/L   Glucose, Bld 92 70 - 99 mg/dL    Comment: Glucose reference range applies only to samples taken after fasting for at least 8 hours.   BUN 80 (H) 6 - 20 mg/dL   Creatinine, Ser 85.19 (H) 0.61 - 1.24 mg/dL   Calcium 8.6 (L) 8.9 - 10.3 mg/dL   GFR, Estimated 4 (L) >60 mL/min    Comment: (NOTE) Calculated using the CKD-EPI Creatinine Equation (2021)    Anion gap 16 (H) 5 - 15    Comment: Performed at [facility], [address], [place], [place] [address]  CBC     Status: Abnormal   Collection Time: 05/31/24  1:05 AM  Result Value Ref Range   WBC 4.1 4.0 - 10.5 K/uL   RBC 3.28 (L) 4.22 - 5.81 MIL/uL   Hemoglobin 10.0 (L) 13.0 - 17.0 g/dL   HCT 68.5 (L) 60.9 - 47.9 %   MCV 95.7 80.0 - 100.0 fL   MCH 30.5 26.0 - 34.0 pg   MCHC 31.8 30.0 - 36.0 g/dL   RDW 85.1 88.4 - 84.4 %   Platelets 218 150 - 400 K/uL   nRBC 0.0 0.0 - 0.2 %    Comment: Performed at [facility], [address], [place], [place] [address]  Troponin T, High Sensitivity     Status: Abnormal   Collection Time: 05/31/24  1:05 AM  Result Value Ref Range   Troponin T High Sensitivity 124 (HH) 0 - 19 ng/L    Comment: Critical Value, Read Back and verified with Y,LEBRON RN @0203  05/31/24 E,BENTON (NOTE) Biotin concentrations > 1000 ng/mL falsely decrease TnT results.  Serial cardiac troponin measurements are suggested.  Refer to the Links section for chest pain algorithms and additional  guidance. Performed at [facility], [address], [place], [place] [address]   Troponin T, High Sensitivity     Status: Abnormal   Collection Time: 05/31/24  2:20 AM  Result Value Ref Range   Troponin T High Sensitivity 124 (HH) 0 - 19 ng/L    Comment: Critical value noted. Value is consistent with previously reported and called value  (NOTE) Biotin concentrations > 1000 ng/mL falsely decrease TnT results.  Serial cardiac troponin measurements are suggested.  Refer to the Links section for chest pain algorithms and additional  guidance. Performed at [facility], [address], [place], [place] [address]    DG Chest 2 View Result Date: 05/31/2024 EXAM: 2 VIEW(S) XRAY OF THE CHEST 05/31/2024 12:50:00 AM COMPARISON: 05/29/2024 CLINICAL HISTORY: chest pain FINDINGS: LUNGS AND PLEURA: Low lung volumes. Mild bibasilar atelectasis. No pleural effusion. No pneumothorax. HEART AND MEDIASTINUM: No acute abnormality of the cardiac and mediastinal silhouettes.s BONES AND SOFT TISSUES: No acute osseous abnormality. IMPRESSION: 1. No acute cardiopulmonary abnormality. 2. Low lung volumes with mild bibasilar atelectasis. Electronically signed by: Dorethia Molt MD 05/31/2024 12:58 AM EST RP Workstation: [id number]   DG Chest Portable 1 View Result Date: 05/29/2024 CLINICAL DATA:  Shortness of breath.  Cough and fever. EXAM: PORTABLE CHEST 1 VIEW COMPARISON:  Most recent comparison 05/13/2024 FINDINGS: Mild cardiomegaly. Vascular congestion. No focal pulmonary opacity. No pleural effusion or pneumothorax. Right costophrenic angle not entirely included in the field of view. No acute osseous abnormalities. IMPRESSION: Mild cardiomegaly and vascular congestion. Electronically Signed   By: Andrea Gasman  M.D.   On: 05/29/2024 20:47   DG Tibia/Fibula Left Result Date: 05/29/2024 EXAM: VIEW(S) XRAY OF THE LEFT TIBIA AND FIBULA 05/29/2024 07:52:00 PM COMPARISON: None available. CLINICAL HISTORY: pain FINDINGS: BONES AND  JOINTS: No acute fracture. No malalignment. SOFT TISSUES: Diffuse subcutaneous edema throughout the calf. IMPRESSION: 1. Diffuse subcutaneous edema throughout the calf. Electronically signed by: Norman Gatlin MD 05/29/2024 08:01 PM EST RP Workstation: [id number]    PMH:   Past Medical History:  Diagnosis Date   Alcohol abuse    Arthritis    Asthma    Back pain    Chronic kidney disease (CKD)    Stage 5 Dialysis Tu/Th/Sa   Depression    GERD (gastroesophageal reflux disease)    Hypertension    Knee joint pain, left    Neck pain    Pneumonia    PONV (postoperative nausea and vomiting)    Psychiatric problem    unspecified by patient (receives injections bi-weekly)   Tobacco abuse     PSH:   Past Surgical History:  Procedure Laterality Date   arm surgery     AV FISTULA PLACEMENT Left 07/19/2022   Procedure: LEFT ARM ARTERIOVENOUS (AV) FISTULA CREATION;  Surgeon: Serene Gaile ORN, MD;  Location: [facility] OR;  Service: Vascular;  Laterality: Left;   IR FLUORO GUIDE CV LINE RIGHT  03/31/2022   IR PERC TUN PERIT CATH WO PORT S&I /IMAG  03/31/2022   IR US  GUIDE VASC ACCESS RIGHT  03/31/2022    Allergies: Allergies[1]  Medications:   Prior to Admission medications  Medication Sig Start Date End Date Taking? Authorizing Provider  acetaminophen  (TYLENOL ) 500 MG tablet Take 500 mg by mouth every 6 (six) hours as needed for moderate pain.    [provider]  amLODipine  (NORVASC ) 5 MG tablet Take 1 tablet (5 mg total) by mouth daily. 04/14/24   Paseda, Folashade R, FNP  amoxicillin  (AMOXIL ) 500 MG capsule Take 1 capsule (500 mg total) by mouth daily for 7 days. 05/30/24 06/06/24  Dreama Longs, MD  benztropine  (COGENTIN ) 1 MG tablet Take 1 mg by mouth 2 (two) times daily.    [provider]  cinacalcet  (SENSIPAR ) 30 MG tablet Take 1 tablet (30 mg total) by mouth every evening. 11/16/23   Paseda, Folashade R, FNP  citalopram  (CELEXA ) 20 MG tablet Take 1 tablet (20 mg  total) by mouth every morning. 11/16/23   Paseda, Folashade R, FNP  doxycycline  (VIBRAMYCIN ) 100 MG capsule Take 1 capsule (100 mg total) by mouth 2 (two) times daily. 05/13/24   Keith Sor, PA-C  ibuprofen  (ADVIL ) 600 MG tablet Take 1 tablet (600 mg total) by mouth every 8 (eight) hours as needed (severe pain 7-10). 10/23/23   Paseda, Folashade R, FNP  meclizine  (ANTIVERT ) 25 MG tablet Take 1 tablet (25 mg total) by mouth 3 (three) times daily as needed for dizziness. 04/30/24   Vicky Charleston, PA-C  sevelamer carbonate (RENVELA) 800 MG tablet Take 2,400 mg by mouth 3 (three) times daily with meals.    [provider]  valbenazine  (INGREZZA ) 80 MG capsule Samples of this drug were given to the patient, quantity 1, Lot Number [id number] Patient taking differently: Take 80 mg by mouth daily. 11/27/22   TatAsberry RAMAN, DO    Discontinued Meds:  There are no discontinued medications.  Social History:  reports that he has been smoking cigarettes. He has never been exposed to tobacco smoke. He has never used smokeless tobacco. He reports that he  does not currently use alcohol. He reports current drug use. Drug: Marijuana.  Family History:   Family History  Problem Relation Age of Onset   Diabetes Mother    Diabetes Father    CVA Father    Dementia Maternal Grandmother    Kidney disease Maternal Grandmother    Heart attack Neg Hx     Blood pressure (!) 170/116, pulse 81, temperature 99.4 F (37.4 C), temperature source Oral, resp. rate 20, height 5' 5 (1.651 m), weight 88.9 kg, SpO2 100%. Gen alert, no distress Sclera anicteric, throat clear  Neck: No bruits Chest clear bilat to bases but poor air movement RRR no MRG Abd soft ntnd no mass or ascites +bs Ext tr pitting LE/ UE edema Neuro is alert, Ox 3 , nf LUA BCF +bruit        Andretta Ergle, LYNWOOD ORN, MD 05/31/2024, 8:11 AM      [1]  Allergies Allergen Reactions   Aspirin Swelling   Ibuprofen  Rash

## 2024-05-31 NOTE — ED Provider Notes (Signed)
 " [place] EMERGENCY DEPARTMENT AT [facility] Provider Note   CSN: [id number] Arrival date & time: 05/31/24  0006     Patient presents with: Chest Pain   Storm E Blissett is a 38 y.o. male who presents with concern for 2 days of chest pain and shortness of breath that is worse with exertion or when lying flat.  He states he has not had dialysis in 7 days.  Was seen here 48 hours ago diagnosed with influenza A.  States he did not present for dialysis as scheduled at that time due to lack of bus fare.  Patient is well-known to this department.  ESRD, on MWF, schizophrenia recent flu diagnosis on 05/29/24.    HPI     Prior to Admission medications  Medication Sig Start Date End Date Taking? Authorizing Provider  acetaminophen  (TYLENOL ) 500 MG tablet Take 500 mg by mouth every 6 (six) hours as needed for moderate pain.    [provider]  amLODipine  (NORVASC ) 5 MG tablet Take 1 tablet (5 mg total) by mouth daily. 04/14/24   Paseda, Folashade R, FNP  amoxicillin  (AMOXIL ) 500 MG capsule Take 1 capsule (500 mg total) by mouth daily for 7 days. 05/30/24 06/06/24  Dreama Longs, MD  benztropine  (COGENTIN ) 1 MG tablet Take 1 mg by mouth 2 (two) times daily.    [provider]  cinacalcet  (SENSIPAR ) 30 MG tablet Take 1 tablet (30 mg total) by mouth every evening. 11/16/23   Paseda, Folashade R, FNP  citalopram  (CELEXA ) 20 MG tablet Take 1 tablet (20 mg total) by mouth every morning. 11/16/23   Paseda, Folashade R, FNP  doxycycline  (VIBRAMYCIN ) 100 MG capsule Take 1 capsule (100 mg total) by mouth 2 (two) times daily. 05/13/24   Keith Sor, PA-C  ibuprofen  (ADVIL ) 600 MG tablet Take 1 tablet (600 mg total) by mouth every 8 (eight) hours as needed (severe pain 7-10). 10/23/23   Paseda, Folashade R, FNP  meclizine  (ANTIVERT ) 25 MG tablet Take 1 tablet (25 mg total) by mouth 3 (three) times daily as needed for dizziness. 04/30/24   Vicky Charleston, PA-C  sevelamer  carbonate (RENVELA) 800 MG tablet Take 2,400 mg by mouth 3 (three) times daily with meals.    [provider]  valbenazine  (INGREZZA ) 80 MG capsule Samples of this drug were given to the patient, quantity 1, Lot Number [id number] Patient taking differently: Take 80 mg by mouth daily. 11/27/22   TatAsberry RAMAN, DO    Allergies: Aspirin and Ibuprofen     Review of Systems  Constitutional: Negative.   HENT: Negative.    Respiratory:  Positive for shortness of breath.   Cardiovascular:  Positive for chest pain and leg swelling.  Gastrointestinal: Negative.   Genitourinary: Negative.     Updated Vital Signs BP (!) 156/97 (BP Location: Right Arm)   Pulse 80   Temp 98 F (36.7 C) (Oral)   Resp 17   Ht 5' 5 (1.651 m)   Wt 88.9 kg   SpO2 100%   BMI 32.62 kg/m   Physical Exam Vitals and nursing note reviewed.  Constitutional:      Appearance: He is not ill-appearing or toxic-appearing.  HENT:     Head: Normocephalic and atraumatic.     Mouth/Throat:     Mouth: Mucous membranes are moist.     Pharynx: No oropharyngeal exudate or posterior oropharyngeal erythema.  Eyes:     General:        Right eye:  No discharge.        Left eye: No discharge.     Conjunctiva/sclera: Conjunctivae normal.  Cardiovascular:     Rate and Rhythm: Normal rate and regular rhythm.     Pulses: Normal pulses.  Pulmonary:     Effort: Pulmonary effort is normal. No respiratory distress.     Breath sounds: Examination of the right-middle field reveals rales. Examination of the right-lower field reveals rales. Examination of the left-lower field reveals rales. Rales present. No wheezing.  Chest:     Chest wall: No mass, tenderness or edema.  Abdominal:     General: Bowel sounds are normal. There is no distension.     Palpations: Abdomen is soft.     Tenderness: There is no abdominal tenderness.  Musculoskeletal:        General: No deformity.     Cervical back: Neck supple.     Right lower leg:  Edema present.     Left lower leg: Edema present.  Skin:    General: Skin is warm and dry.     Capillary Refill: Capillary refill takes less than 2 seconds.  Neurological:     General: No focal deficit present.     Mental Status: He is alert and oriented to person, place, and time. Mental status is at baseline.  Psychiatric:        Mood and Affect: Mood normal.    (all labs ordered are listed, but only abnormal results are displayed) Labs Reviewed  BASIC METABOLIC PANEL WITH GFR - Abnormal; Notable for the following components:      Result Value   CO2 15 (*)    BUN 80 (*)    Creatinine, Ser 14.80 (*)    Calcium 8.6 (*)    GFR, Estimated 4 (*)    Anion gap 16 (*)    All other components within normal limits  CBC - Abnormal; Notable for the following components:   RBC 3.28 (*)    Hemoglobin 10.0 (*)    HCT 31.4 (*)    All other components within normal limits  TROPONIN T, HIGH SENSITIVITY - Abnormal; Notable for the following components:   Troponin T High Sensitivity 124 (*)    All other components within normal limits  TROPONIN T, HIGH SENSITIVITY - Abnormal; Notable for the following components:   Troponin T High Sensitivity 124 (*)    All other components within normal limits    EKG: None  Radiology: DG Chest 2 View Result Date: 05/31/2024 EXAM: 2 VIEW(S) XRAY OF THE CHEST 05/31/2024 12:50:00 AM COMPARISON: 05/29/2024 CLINICAL HISTORY: chest pain FINDINGS: LUNGS AND PLEURA: Low lung volumes. Mild bibasilar atelectasis. No pleural effusion. No pneumothorax. HEART AND MEDIASTINUM: No acute abnormality of the cardiac and mediastinal silhouettes.s BONES AND SOFT TISSUES: No acute osseous abnormality. IMPRESSION: 1. No acute cardiopulmonary abnormality. 2. Low lung volumes with mild bibasilar atelectasis. Electronically signed by: Dorethia Molt MD 05/31/2024 12:58 AM EST RP Workstation: [id number]   DG Chest Portable 1 View Result Date: 05/29/2024 CLINICAL DATA:  Shortness  of breath.  Cough and fever. EXAM: PORTABLE CHEST 1 VIEW COMPARISON:  Most recent comparison 05/13/2024 FINDINGS: Mild cardiomegaly. Vascular congestion. No focal pulmonary opacity. No pleural effusion or pneumothorax. Right costophrenic angle not entirely included in the field of view. No acute osseous abnormalities. IMPRESSION: Mild cardiomegaly and vascular congestion. Electronically Signed   By: Andrea Gasman M.D.   On: 05/29/2024 20:47   DG Tibia/Fibula Left Result Date: 05/29/2024 EXAM: VIEW(S)  XRAY OF THE LEFT TIBIA AND FIBULA 05/29/2024 07:52:00 PM COMPARISON: None available. CLINICAL HISTORY: pain FINDINGS: BONES AND JOINTS: No acute fracture. No malalignment. SOFT TISSUES: Diffuse subcutaneous edema throughout the calf. IMPRESSION: 1. Diffuse subcutaneous edema throughout the calf. Electronically signed by: Norman Gatlin MD 05/29/2024 08:01 PM EST RP Workstation: [id number]     Procedures   Medications Ordered in the ED - No data to display                                  Medical Decision Making 38 year old male with known ESRD on HD who presents after a week for shortness of breath and chest discomfort without dialysis.  Hypertensive on intake of medicines otherwise normal.  Cardiac exam is unremarkable, rales throughout the lung fields bilaterally, 1+ lower extremity edema without pitting.  Patient does not clinically appear very fluid overloaded and extremities.    Amount and/or Complexity of Data Reviewed Labs: ordered.    Details: CBC with anemia, 10, BMP with baseline creatinine near 14, potassium 5, Troponin 124, flat at 124 recheck.  Radiology: ordered.    Details: Chest x-ray reassuring. ECG/medicine tests:     Details:    EKG unchanged from prior.   Patient resting comfortably in the hospital bed, requesting that we leave him alone so he may sleep. Care of this patient signed out to oncoming ED provider at time of shift change. All pertinent HPI, physical exam,  and laboratory findings were discussed with them prior to my departure. Disposition of patient pending completion of workup, reevaluation, and clinical judgement of oncoming ED provider.  Pending nephro consult for ? Of urgent inpatient dialysis before discharge.   This chart was dictated using voice recognition software, Dragon. Despite the best efforts of this provider to proofread and correct errors, errors may still occur which can change documentation meaning.      Final diagnoses:  None    ED Discharge Orders     None          Bobette Pleasant JONELLE DEVONNA 05/31/24 [id number]  "

## 2024-05-31 NOTE — ED Notes (Signed)
 RN was advised pt was leaving. PA notified.

## 2024-05-31 NOTE — ED Notes (Signed)
 Date and time results received: 05/31/2024 0205 (use smartphrase .now to insert current time)  Test: trop Critical Value: 124  Name of Provider Notified: Bero and Dr. Cloria

## 2024-05-31 NOTE — ED Notes (Signed)
 Pt seen walking down hallway. I asked pt if he was leaving, he said yes. I asked pt if he had an IV, he flipped me off.

## 2024-05-31 NOTE — ED Notes (Signed)
"  Pt refusing blood work at  this time   "

## 2024-05-31 NOTE — ED Notes (Signed)
 Patient has taken all leads and BP cuff off again.

## 2024-05-31 NOTE — ED Notes (Signed)
 Pt walked up to sort desk asking for a bus pass I advised pt we do not give bus passes to patients until their care is complete. I watched this patient exit the ED at this time

## 2024-06-01 ENCOUNTER — Emergency Department (HOSPITAL_COMMUNITY): Payer: MEDICAID

## 2024-06-01 ENCOUNTER — Emergency Department (HOSPITAL_COMMUNITY)
Admission: EM | Admit: 2024-06-01 | Discharge: 2024-06-01 | Discharge status: Against Medical Advice | Payer: MEDICAID | Attending: Nephrology | Admitting: Nephrology

## 2024-06-01 ENCOUNTER — Encounter (HOSPITAL_COMMUNITY): Payer: Self-pay

## 2024-06-01 ENCOUNTER — Emergency Department (HOSPITAL_COMMUNITY): Admission: EM | Admit: 2024-06-01 | Discharge: 2024-06-02 | Payer: MEDICAID | Source: Home / Self Care

## 2024-06-01 ENCOUNTER — Other Ambulatory Visit: Payer: Self-pay

## 2024-06-01 DIAGNOSIS — M79605 Pain in left leg: Secondary | ICD-10-CM | POA: Diagnosis present

## 2024-06-01 DIAGNOSIS — R2243 Localized swelling, mass and lump, lower limb, bilateral: Secondary | ICD-10-CM | POA: Diagnosis present

## 2024-06-01 DIAGNOSIS — Z5321 Procedure and treatment not carried out due to patient leaving prior to being seen by health care provider: Secondary | ICD-10-CM | POA: Insufficient documentation

## 2024-06-01 LAB — CBC
HCT: 31.7 % — ABNORMAL LOW (ref 39.0–52.0)
Hemoglobin: 10 g/dL — ABNORMAL LOW (ref 13.0–17.0)
MCH: 30.1 pg (ref 26.0–34.0)
MCHC: 31.5 g/dL (ref 30.0–36.0)
MCV: 95.5 fL (ref 80.0–100.0)
Platelets: 225 K/uL (ref 150–400)
RBC: 3.32 MIL/uL — ABNORMAL LOW (ref 4.22–5.81)
RDW: 14.9 % (ref 11.5–15.5)
WBC: 4.8 K/uL (ref 4.0–10.5)
nRBC: 0 % (ref 0.0–0.2)

## 2024-06-01 LAB — BASIC METABOLIC PANEL WITH GFR
Anion gap: 20 — ABNORMAL HIGH (ref 5–15)
BUN: 88 mg/dL — ABNORMAL HIGH (ref 6–20)
CO2: 12 mmol/L — ABNORMAL LOW (ref 22–32)
Calcium: 7.3 mg/dL — ABNORMAL LOW (ref 8.9–10.3)
Chloride: 107 mmol/L (ref 98–111)
Creatinine, Ser: 15.3 mg/dL — ABNORMAL HIGH (ref 0.61–1.24)
GFR, Estimated: 4 mL/min — ABNORMAL LOW
Glucose, Bld: 79 mg/dL (ref 70–99)
Potassium: 5.2 mmol/L — ABNORMAL HIGH (ref 3.5–5.1)
Sodium: 140 mmol/L (ref 135–145)

## 2024-06-01 MED ORDER — CHLORHEXIDINE GLUCONATE CLOTH 2 % EX PADS: 6.0000 | MEDICATED_PAD | Freq: Every day | CUTANEOUS | Status: DC

## 2024-06-01 NOTE — ED Triage Notes (Signed)
 Patient complaing of left calf pain and swelling.  Patient extremely dramatic in triage.  Was here  yesterday for dialysis but left AMA.

## 2024-06-01 NOTE — ED Notes (Signed)
 Pt ambulating in lobby and was offered W/C by staff. Pt refused.

## 2024-06-01 NOTE — ED Notes (Signed)
 Per EDP Steinl MD pt ok for lobby

## 2024-06-01 NOTE — ED Triage Notes (Signed)
 [place] [facility] [place]/o bilat leg swelling x 3 days, dialysis pt,presented to ED past 2 days  for the same ;was here to have dialysis but eloped prior to treatment

## 2024-06-01 NOTE — ED Notes (Signed)
Pt called x2 for vitals recheck. Pt could not be found.
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
## 2024-06-01 NOTE — Progress Notes (Signed)
 Noted patient has returned to the ED after leaving AMA yesterday. Usual dialysis at [facility] T[facility]. Misses HD often. He did not stay for dialysis yesterday. Appears currently being evaluated for leg swelling/pain.   Orders placed for dialysis first shift Monday No emergency dialysis indications today. Sundays are reserved for emergency cases.   If he is found to be medically stable for discharge tonight he can resume HD at his outpatient clinic tomorrow. [facility] is on a holiday schedule so TTS patients will be running on Monday 12/29.   Maisie Fellows Purvi Ruehl PA-C 06/01/2024,4:27 PM

## 2024-06-01 NOTE — ED Provider Triage Note (Signed)
 Emergency Medicine Provider Triage Evaluation Note  William Gregory , a 38 y.o. male  was evaluated in triage.  Pt complains of bil leg swelling. Pt with hx esrd/hd on t/t/s  - pt was in ed yesterday and earlier today, when arrangements were made for his dialysis, but patient left ED both times - [place]. No chest pain. No sob. No fevers.   Review of Systems  Positive: Swelling.  Negative: Fevers, sob.   Physical Exam  BP (!) 147/95 (BP Location: Right Arm)   Pulse 76   Temp 98.4 F (36.9 C) (Oral)   Resp 19   Ht 1.651 m (5' 5)   Wt 88.5 kg   SpO2 99%   BMI 32.45 kg/m  Gen:   Awake, no distress   Resp:  Normal effort no increased wob MSK:   Symmetric, mild-mod bil leg edema.    Medical Decision Making  Medically screening exam initiated at 8:54 PM.  Appropriate orders placed.  Morgon E Batiz was informed that the remainder of the evaluation will be completed by another provider, this initial triage assessment does not replace that evaluation, and the importance of remaining in the ED until their evaluation is complete.  Labs ordered.   Bernard Drivers, MD 06/01/24 2056

## 2024-06-02 NOTE — ED Notes (Signed)
 Pt called for vitals x3 no response.

## 2024-06-02 NOTE — ED Notes (Signed)
Pt called for vitals no response x3 ° °

## 2024-06-03 LAB — CULTURE, BLOOD (ROUTINE X 2)
Culture: NO GROWTH
Culture: NO GROWTH
Special Requests: ADEQUATE
Special Requests: ADEQUATE

## 2024-06-05 ENCOUNTER — Emergency Department (HOSPITAL_COMMUNITY): Admission: EM | Admit: 2024-06-05 | Discharge: 2024-06-06 | Discharge status: Against Medical Advice | Payer: MEDICAID

## 2024-06-05 ENCOUNTER — Other Ambulatory Visit: Payer: Self-pay

## 2024-06-05 ENCOUNTER — Encounter (HOSPITAL_COMMUNITY): Payer: Self-pay

## 2024-06-05 DIAGNOSIS — Z5321 Procedure and treatment not carried out due to patient leaving prior to being seen by health care provider: Secondary | ICD-10-CM | POA: Diagnosis not present

## 2024-06-05 DIAGNOSIS — R0602 Shortness of breath: Secondary | ICD-10-CM | POA: Diagnosis present

## 2024-06-05 LAB — CBC WITH DIFFERENTIAL/PLATELET
Abs Immature Granulocytes: 0.03 K/uL (ref 0.00–0.07)
Basophils Absolute: 0 K/uL (ref 0.0–0.1)
Basophils Relative: 0 %
Eosinophils Absolute: 0 K/uL (ref 0.0–0.5)
Eosinophils Relative: 0 %
HCT: 30.7 % — ABNORMAL LOW (ref 39.0–52.0)
Hemoglobin: 9.8 g/dL — ABNORMAL LOW (ref 13.0–17.0)
Immature Granulocytes: 0 %
Lymphocytes Relative: 13 %
Lymphs Abs: 1 K/uL (ref 0.7–4.0)
MCH: 29.6 pg (ref 26.0–34.0)
MCHC: 31.9 g/dL (ref 30.0–36.0)
MCV: 92.7 fL (ref 80.0–100.0)
Monocytes Absolute: 0.7 K/uL (ref 0.1–1.0)
Monocytes Relative: 8 %
Neutro Abs: 6.4 K/uL (ref 1.7–7.7)
Neutrophils Relative %: 79 %
Platelets: 266 K/uL (ref 150–400)
RBC: 3.31 MIL/uL — ABNORMAL LOW (ref 4.22–5.81)
RDW: 15 % (ref 11.5–15.5)
WBC: 8.2 K/uL (ref 4.0–10.5)
nRBC: 0 % (ref 0.0–0.2)

## 2024-06-05 LAB — BASIC METABOLIC PANEL WITH GFR
Anion gap: 18 — ABNORMAL HIGH (ref 5–15)
BUN: 65 mg/dL — ABNORMAL HIGH (ref 6–20)
CO2: 20 mmol/L — ABNORMAL LOW (ref 22–32)
Calcium: 6.4 mg/dL — CL (ref 8.9–10.3)
Chloride: 106 mmol/L (ref 98–111)
Creatinine, Ser: 12.1 mg/dL — ABNORMAL HIGH (ref 0.61–1.24)
GFR, Estimated: 5 mL/min — ABNORMAL LOW
Glucose, Bld: 99 mg/dL (ref 70–99)
Potassium: 3.9 mmol/L (ref 3.5–5.1)
Sodium: 144 mmol/L (ref 135–145)

## 2024-06-05 LAB — TROPONIN T, HIGH SENSITIVITY: Troponin T High Sensitivity: 118 ng/L (ref 0–19)

## 2024-06-05 NOTE — ED Triage Notes (Signed)
 Pt coming in reporting shortness of breath. Pt reports that he was positive for the flu on 12/27. Pt reports having fevers at home. Pt denies taking any medications for his fever.

## 2024-06-05 NOTE — ED Provider Triage Note (Signed)
 Emergency Medicine Provider Triage Evaluation Note  COULTON SCHLINK , a 39 y.o. male  was evaluated in triage.  Pt complains of shortness of breath.  Patient was seen in the emergency department 12/27 and diagnosed with influenza A and states he is still feeling the same as he was a few days ago.  Patient also states that he thinks he has a history of asthma and/or COPD.  He reports he is coughing stuff up.  Patient also states that he has had a fever at home.  He denies taking any Tylenol  or ibuprofen  as he states he does not have any.  Patient is on dialysis, last dialysis session yesterday.  Review of Systems  Positive: Shortness of breath, cough, fever Negative:   Physical Exam  BP (!) 143/96 (BP Location: Right Arm)   Pulse 94   Resp 20   Ht 5' 5 (1.651 m)   Wt 88 kg   SpO2 98%   BMI 32.28 kg/m  Gen:   Awake, no distress   Resp:  Normal effort, lung sounds clear MSK:   Moves extremities without difficulty  Other:    Medical Decision Making  Medically screening exam initiated at 2:47 PM.  Appropriate orders placed.  [place] E Step[place]k was informed that the remainder of the evaluation will be completed by another provider, this initial triage assessment does not replace that evaluation, and the importance of remaining in the ED until their evaluation is complete.  Labs, chest x-ray ordered.   Torrence Marry RAMAN, PA-C 06/05/24 [phone]

## 2024-06-08 ENCOUNTER — Emergency Department (HOSPITAL_COMMUNITY): Payer: MEDICAID

## 2024-06-08 ENCOUNTER — Emergency Department (HOSPITAL_COMMUNITY): Admission: EM | Admit: 2024-06-08 | Payer: MEDICAID | Source: Home / Self Care

## 2024-06-08 ENCOUNTER — Encounter (HOSPITAL_COMMUNITY): Payer: Self-pay | Admitting: *Deleted

## 2024-06-08 ENCOUNTER — Other Ambulatory Visit: Payer: Self-pay

## 2024-06-08 DIAGNOSIS — Z5321 Procedure and treatment not carried out due to patient leaving prior to being seen by health care provider: Secondary | ICD-10-CM | POA: Insufficient documentation

## 2024-06-08 DIAGNOSIS — M79606 Pain in leg, unspecified: Secondary | ICD-10-CM | POA: Insufficient documentation

## 2024-06-08 DIAGNOSIS — R0602 Shortness of breath: Secondary | ICD-10-CM | POA: Diagnosis not present

## 2024-06-08 DIAGNOSIS — Z992 Dependence on renal dialysis: Secondary | ICD-10-CM | POA: Insufficient documentation

## 2024-06-08 NOTE — ED Provider Triage Note (Cosign Needed Addendum)
 Emergency Medicine Provider Triage Evaluation Note  William Gregory , a 39 y.o. male  was evaluated in triage.  Pt complains of SOB.  States he has missed HD, only went 1 time last week per his report.  Review of Systems  Positive: SOB, missed HD Negative: fever  Physical Exam  BP (!) 161/99   Pulse 87   Temp 97.7 F (36.5 C) (Oral)   Resp 20   SpO2 100%  Gen:   Awake, no distress   Resp:  Normal effort  MSK:   Moves extremities without difficulty  Other:    Medical Decision Making  Medically screening exam initiated at 11:08 PM.  Appropriate orders placed.  William Gregory was informed that the remainder of the evaluation will be completed by another provider, this initial triage assessment does not replace that evaluation, and the importance of remaining in the ED until their evaluation is complete.  Missed HD, feeling SOB.  VSS on RA.  EKG, labs, CXR.  11:25 PM Patient refused lab draw and CXR in triage.   William Olam HERO, PA-C 06/08/24 2309    William Olam HERO, PA-C 06/08/24 2325

## 2024-06-08 NOTE — ED Triage Notes (Signed)
 Pt arrived with [facility] from outside c/o leg pain and missed dialysis today. Pt noncomplaint in answering questions with EMS.   EMS VS 170/100, pulse 100

## 2024-06-08 NOTE — ED Notes (Signed)
 Pt refused to get chest xray, also refused blood work. Pt educated on the importance of labs and scans due to missed dialysis. Pt continue to refused. PA made aware.

## 2024-06-09 NOTE — ED Notes (Signed)
 Pt walked out - did not want to be treated

## 2024-06-12 ENCOUNTER — Emergency Department (HOSPITAL_COMMUNITY): Payer: MEDICAID

## 2024-06-12 ENCOUNTER — Other Ambulatory Visit: Payer: Self-pay

## 2024-06-12 ENCOUNTER — Encounter (HOSPITAL_COMMUNITY): Payer: Self-pay | Admitting: Emergency Medicine

## 2024-06-12 ENCOUNTER — Emergency Department (HOSPITAL_COMMUNITY)
Admission: EM | Admit: 2024-06-12 | Discharge: 2024-06-13 | Discharge status: Against Medical Advice | Payer: MEDICAID | Attending: Internal Medicine | Admitting: Internal Medicine

## 2024-06-12 DIAGNOSIS — F1411 Cocaine abuse, in remission: Secondary | ICD-10-CM | POA: Diagnosis present

## 2024-06-12 DIAGNOSIS — K219 Gastro-esophageal reflux disease without esophagitis: Secondary | ICD-10-CM | POA: Diagnosis present

## 2024-06-12 DIAGNOSIS — Z992 Dependence on renal dialysis: Secondary | ICD-10-CM | POA: Diagnosis not present

## 2024-06-12 DIAGNOSIS — I120 Hypertensive chronic kidney disease with stage 5 chronic kidney disease or end stage renal disease: Secondary | ICD-10-CM | POA: Diagnosis not present

## 2024-06-12 DIAGNOSIS — F32A Depression, unspecified: Secondary | ICD-10-CM

## 2024-06-12 DIAGNOSIS — Z5329 Procedure and treatment not carried out because of patient's decision for other reasons: Secondary | ICD-10-CM | POA: Diagnosis not present

## 2024-06-12 DIAGNOSIS — M79606 Pain in leg, unspecified: Secondary | ICD-10-CM | POA: Insufficient documentation

## 2024-06-12 DIAGNOSIS — J188 Other pneumonia, unspecified organism: Secondary | ICD-10-CM | POA: Diagnosis not present

## 2024-06-12 DIAGNOSIS — Z79899 Other long term (current) drug therapy: Secondary | ICD-10-CM | POA: Diagnosis not present

## 2024-06-12 DIAGNOSIS — N186 End stage renal disease: Secondary | ICD-10-CM | POA: Insufficient documentation

## 2024-06-12 DIAGNOSIS — F329 Major depressive disorder, single episode, unspecified: Secondary | ICD-10-CM | POA: Insufficient documentation

## 2024-06-12 DIAGNOSIS — Z720 Tobacco use: Secondary | ICD-10-CM | POA: Diagnosis present

## 2024-06-12 DIAGNOSIS — R059 Cough, unspecified: Secondary | ICD-10-CM | POA: Diagnosis present

## 2024-06-12 DIAGNOSIS — I252 Old myocardial infarction: Secondary | ICD-10-CM

## 2024-06-12 DIAGNOSIS — I10 Essential (primary) hypertension: Secondary | ICD-10-CM | POA: Diagnosis present

## 2024-06-12 LAB — BASIC METABOLIC PANEL WITH GFR
Anion gap: 18 — ABNORMAL HIGH (ref 5–15)
BUN: 90 mg/dL — ABNORMAL HIGH (ref 6–20)
CO2: 15 mmol/L — ABNORMAL LOW (ref 22–32)
Calcium: 7.1 mg/dL — ABNORMAL LOW (ref 8.9–10.3)
Chloride: 110 mmol/L (ref 98–111)
Creatinine, Ser: 14.6 mg/dL — ABNORMAL HIGH (ref 0.61–1.24)
GFR, Estimated: 4 mL/min — ABNORMAL LOW
Glucose, Bld: 79 mg/dL (ref 70–99)
Potassium: 5.5 mmol/L — ABNORMAL HIGH (ref 3.5–5.1)
Sodium: 144 mmol/L (ref 135–145)

## 2024-06-12 LAB — CBC
HCT: 29.2 % — ABNORMAL LOW (ref 39.0–52.0)
Hemoglobin: 9.1 g/dL — ABNORMAL LOW (ref 13.0–17.0)
MCH: 29.9 pg (ref 26.0–34.0)
MCHC: 31.2 g/dL (ref 30.0–36.0)
MCV: 96.1 fL (ref 80.0–100.0)
Platelets: 302 K/uL (ref 150–400)
RBC: 3.04 MIL/uL — ABNORMAL LOW (ref 4.22–5.81)
RDW: 15.1 % (ref 11.5–15.5)
WBC: 10.1 K/uL (ref 4.0–10.5)
nRBC: 0 % (ref 0.0–0.2)

## 2024-06-12 LAB — TROPONIN T, HIGH SENSITIVITY: Troponin T High Sensitivity: 131 ng/L (ref 0–19)

## 2024-06-12 MED ORDER — SODIUM CHLORIDE 0.9 % IV SOLN
500.0000 mg | Freq: Once | INTRAVENOUS | Status: AC
  Administered 2024-06-13: 500 mg via INTRAVENOUS
  Filled 2024-06-12: qty 5

## 2024-06-12 MED ORDER — SODIUM CHLORIDE 0.9 % IV SOLN
2.0000 g | Freq: Once | INTRAVENOUS | Status: AC
  Administered 2024-06-12: 2 g via INTRAVENOUS
  Filled 2024-06-12: qty 20

## 2024-06-12 NOTE — H&P (Signed)
 " History and Physical    Patient: William Gregory FMW:[id number] DOB: January 01, 1986 DOA: 06/12/2024 DOS: the patient was seen and examined on 06/12/2024 PCP: Pcp, No  Patient coming from: Home  Chief Complaint:  Chief Complaint  Patient presents with   Leg Pain   Cough   needs dialysis   HPI: William Gregory is a 39 y.o. male with medical history significant of homelessness, end-stage renal disease on hemodialysis Tuesdays Thursdays Saturdays who has missed dialysis, essential hypertension, history of tobacco abuse, history of alcohol abuse, history of cocaine  abuse in remission, osteoarthritis and asthma who presented to the ER with complaint that he has not been to dialysis for at least 2 weeks.  He is also noted cough shortness of breath.  Patient has worsening swelling of his legs bilaterally and has been limping when he walks.  Patient noted to have hyperkalemia as well as increased fluid overload.  Nephrology consulted and patient will be dialyzed.  Further workup showed multifocal pneumonia on chest x-ray.  Patient has been admitted for treatment of his multifocal pneumonia as well as hemodialysis.  Review of Systems: As mentioned in the history of present illness. All other systems reviewed and are negative. Past Medical History:  Diagnosis Date   Alcohol abuse    Arthritis    Asthma    Back pain    Chronic kidney disease (CKD)    Stage 5 Dialysis Tu/Th/Sa   Depression    GERD (gastroesophageal reflux disease)    Hypertension    Knee joint pain, left    Neck pain    Pneumonia    PONV (postoperative nausea and vomiting)    Psychiatric problem    unspecified by patient (receives injections bi-weekly)   Tobacco abuse    Past Surgical History:  Procedure Laterality Date   arm surgery     AV FISTULA PLACEMENT Left 07/19/2022   Procedure: LEFT ARM ARTERIOVENOUS (AV) FISTULA CREATION;  Surgeon: Serene Gaile ORN, MD;  Location: [facility] OR;  Service: Vascular;  Laterality: Left;    IR FLUORO GUIDE CV LINE RIGHT  03/31/2022   IR PERC TUN PERIT CATH WO PORT S&I /IMAG  03/31/2022   IR US  GUIDE VASC ACCESS RIGHT  03/31/2022   Social History:  reports that he has been smoking cigarettes. He has never been exposed to tobacco smoke. He has never used smokeless tobacco. He reports that he does not currently use alcohol. He reports current drug use. Drug: Marijuana.  Allergies[1]  Family History  Problem Relation Age of Onset   Diabetes Mother    Diabetes Father    CVA Father    Dementia Maternal Grandmother    Kidney disease Maternal Grandmother    Heart attack Neg Hx     Prior to Admission medications  Medication Sig Start Date End Date Taking? Authorizing Provider  acetaminophen  (TYLENOL ) 500 MG tablet Take 500 mg by mouth every 6 (six) hours as needed for moderate pain.    [provider]  amLODipine  (NORVASC ) 5 MG tablet Take 1 tablet (5 mg total) by mouth daily. 04/14/24   Paseda, Folashade R, FNP  benztropine  (COGENTIN ) 1 MG tablet Take 1 mg by mouth 2 (two) times daily.    [provider]  cinacalcet  (SENSIPAR ) 30 MG tablet Take 1 tablet (30 mg total) by mouth every evening. 11/16/23   Paseda, Folashade R, FNP  citalopram  (CELEXA ) 20 MG tablet Take 1 tablet (20 mg total) by mouth every morning. 11/16/23  Paseda, Folashade R, FNP  doxycycline  (VIBRAMYCIN ) 100 MG capsule Take 1 capsule (100 mg total) by mouth 2 (two) times daily. 05/13/24   Keith Sor, PA-C  ibuprofen  (ADVIL ) 600 MG tablet Take 1 tablet (600 mg total) by mouth every 8 (eight) hours as needed (severe pain 7-10). 10/23/23   Paseda, Folashade R, FNP  meclizine  (ANTIVERT ) 25 MG tablet Take 1 tablet (25 mg total) by mouth 3 (three) times daily as needed for dizziness. 04/30/24   Vicky Charleston, PA-C  sevelamer carbonate (RENVELA) 800 MG tablet Take 2,400 mg by mouth 3 (three) times daily with meals.    [provider]  valbenazine  (INGREZZA ) 80 MG capsule Samples of this drug  were given to the patient, quantity 1, Lot Number [id number] Patient taking differently: Take 80 mg by mouth daily. 11/27/22   Evonnie Asberry RAMAN, DO    Physical Exam: Vitals:   06/12/24 2238 06/12/24 2253 06/12/24 2340  BP:  (!) 189/119 (!) 155/97  Pulse: 95  94  Resp: 20  20  Temp: 99 F (37.2 C)    SpO2: 100%  100%   Constitutional: Chronically ill looking, NAD, calm, comfortable Eyes: PERRL, lids and conjunctivae normal ENMT: Mucous membranes are moist. Posterior pharynx clear of any exudate or lesions.Normal dentition.  Neck: normal, supple, no masses, no thyromegaly Respiratory: Decreased air entry bilaterally with diffuse crackles. Normal respiratory effort. No accessory muscle use.  Cardiovascular: Regular rate and rhythm, no murmurs / rubs / gallops.  1+ extremity edema. 2+ pedal pulses. No carotid bruits.  Abdomen: no tenderness, no masses palpated. No hepatosplenomegaly. Bowel sounds positive.  Musculoskeletal: Good range of motion, no joint swelling or tenderness, Skin: no rashes, lesions, ulcers. No induration Neurologic: CN 2-12 grossly intact. Sensation intact, DTR normal. Strength 5/5 in all 4.  Psychiatric: Normal judgment and insight. Alert and oriented x 3.  Labile mood  Data Reviewed:  Temperature 99, blood pressure 118/119, pulse 95, white count 10.1 hemoglobin 9.9 potassium 5.5.  Creatinine 14.6 with a BUN of 19 and CO2 of 15.  Calcium is 7.1.  Troponin 131.  Chest x-ray showed scattered mild patchy interstitial opacities in the lungs favoring multifocal infection or pneumonia.  EKG showed no new findings  Assessment and Plan:  #1 multifocal pneumonia: Patient will be admitted and started on IV antibiotics.  Will start Rocephin  and Zithromax  for multifocal pneumonia.  Will also monitor patient's pedal pulse.  Blood cultures obtained.  Pharmacy to adjust dose to renal status.  #2 end-stage renal disease: Patient supposed to be on hemodialysis Tuesdays Thursdays and  Saturdays.  Has not had dialysis apparently for 2 weeks.  Nephrology already consulted patient will be dialyzed hopefully in the morning.  #3 hyperkalemia: Patient is likely getting dialyzed.  It is mild.  Monitor.  #4 uncontrolled hypertension: Will confirm resume home regimen and follow.  #5 depression with anxiety: Confirm home regimen and resume.  Counseling to the patient  #6 elevated troponins: Secondary to end-stage renal disease.  Continue monitoring.  #7 asthma syndrome: No evidence of acute exacerbation.  Continue to monitor.  #8 GERD: Continue with PPIs.  #9 homelessness: Social work consult.    Advance Care Planning:   Code Status: Prior full code  Consults: Nephrology Dr. Jerrye  Family Communication: No family at bedside patient is homeless  Severity of Illness: The appropriate patient status for this patient is INPATIENT. Inpatient status is judged to be reasonable and necessary in order to provide the required intensity of  service to ensure the patient's safety. The patient's presenting symptoms, physical exam findings, and initial radiographic and laboratory data in the context of their chronic comorbidities is felt to place them at high risk for further clinical deterioration. Furthermore, it is not anticipated that the patient will be medically stable for discharge from the hospital within 2 midnights of admission.   * I certify that at the point of admission it is my clinical judgment that the patient will require inpatient hospital care spanning beyond 2 midnights from the point of admission due to high intensity of service, high risk for further deterioration and high frequency of surveillance required.*  AuthorBETHA SIM KNOLL, MD 06/12/2024 11:57 PM  For on call review [email or web address].     [1]  Allergies Allergen Reactions   Aspirin Swelling   Ibuprofen  Rash   "

## 2024-06-12 NOTE — ED Provider Notes (Addendum)
 " [facility] AT [facility] Provider Note   CSN: [id number] Arrival date & time: 06/12/24  2224     Patient presents with: Leg Pain, Cough, and needs dialysis   William Gregory is a 39 y.o. male.   22 yoM with a chief complaints of cough fatigue and feeling like he needs dialysis.  Tells me that he has not had dialysis in a couple weeks.  Has had cough fatigue that worsened over the past couple days.  No fevers that he knows of but some worsening difficulty breathing.   Leg Pain Cough      Prior to Admission medications  Medication Sig Start Date End Date Taking? Authorizing Provider  acetaminophen  (TYLENOL ) 500 MG tablet Take 500 mg by mouth every 6 (six) hours as needed for moderate pain.    [provider]  amLODipine  (NORVASC ) 5 MG tablet Take 1 tablet (5 mg total) by mouth daily. 04/14/24   Paseda, Folashade R, FNP  benztropine  (COGENTIN ) 1 MG tablet Take 1 mg by mouth 2 (two) times daily.    [provider]  cinacalcet  (SENSIPAR ) 30 MG tablet Take 1 tablet (30 mg total) by mouth every evening. 11/16/23   Paseda, Folashade R, FNP  citalopram  (CELEXA ) 20 MG tablet Take 1 tablet (20 mg total) by mouth every morning. 11/16/23   Paseda, Folashade R, FNP  doxycycline  (VIBRAMYCIN ) 100 MG capsule Take 1 capsule (100 mg total) by mouth 2 (two) times daily. 05/13/24   Keith Sor, PA-C  ibuprofen  (ADVIL ) 600 MG tablet Take 1 tablet (600 mg total) by mouth every 8 (eight) hours as needed (severe pain 7-10). 10/23/23   Paseda, Folashade R, FNP  meclizine  (ANTIVERT ) 25 MG tablet Take 1 tablet (25 mg total) by mouth 3 (three) times daily as needed for dizziness. 04/30/24   Vicky Charleston, PA-C  sevelamer carbonate (RENVELA) 800 MG tablet Take 2,400 mg by mouth 3 (three) times daily with meals.    [provider]  valbenazine  (INGREZZA ) 80 MG capsule Samples of this drug were given to the patient, quantity 1, Lot Number  [id number] Patient taking differently: Take 80 mg by mouth daily. 11/27/22   TatAsberry RAMAN, DO    Allergies: Aspirin and Ibuprofen     Review of Systems  Respiratory:  Positive for cough.     Updated Vital Signs BP (!) 155/97 (BP Location: Right Wrist)   Pulse 94   Temp 99 F (37.2 C)   Resp 20   SpO2 100%   Physical Exam Vitals and nursing note reviewed.  Constitutional:      Appearance: He is well-developed.  HENT:     Head: Normocephalic and atraumatic.  Eyes:     Pupils: Pupils are equal, round, and reactive to light.  Neck:     Vascular: No JVD.  Cardiovascular:     Rate and Rhythm: Normal rate and regular rhythm.     Heart sounds: No murmur heard.    No friction rub. No gallop.  Pulmonary:     Effort: No respiratory distress.     Breath sounds: No wheezing.  Abdominal:     General: There is no distension.     Tenderness: There is no abdominal tenderness. There is no guarding or rebound.  Musculoskeletal:        General: Normal range of motion.     Cervical back: Normal range of motion and neck supple.  Skin:    Coloration: Skin is not pale.  Findings: No rash.  Neurological:     Mental Status: He is alert and oriented to person, place, and time.  Psychiatric:        Behavior: Behavior normal.     (all labs ordered are listed, but only abnormal results are displayed) Labs Reviewed  BASIC METABOLIC PANEL WITH GFR - Abnormal; Notable for the following components:      Result Value   Potassium 5.5 (*)    CO2 15 (*)    BUN 90 (*)    Creatinine, Ser 14.60 (*)    Calcium 7.1 (*)    GFR, Estimated 4 (*)    Anion gap 18 (*)    All other components within normal limits  CBC - Abnormal; Notable for the following components:   RBC 3.04 (*)    Hemoglobin 9.1 (*)    HCT 29.2 (*)    All other components within normal limits  I-STAT CG4 LACTIC ACID, ED - Abnormal; Notable for the following components:   Lactic Acid, Venous 0.4 (*)    All other components  within normal limits  TROPONIN T, HIGH SENSITIVITY - Abnormal; Notable for the following components:   Troponin T High Sensitivity 131 (*)    All other components within normal limits  TROPONIN T, HIGH SENSITIVITY - Abnormal; Notable for the following components:   Troponin T High Sensitivity 112 (*)    All other components within normal limits  CULTURE, BLOOD (ROUTINE X 2)  CULTURE, BLOOD (ROUTINE X 2)  ETHANOL  I-STAT CG4 LACTIC ACID, ED    EKG: EKG Interpretation Date/Time:  Thursday June 12 2024 22:58:54 EST Ventricular Rate:  84 PR Interval:  134 QRS Duration:  82 QT Interval:  382 QTC Calculation: 451 R Axis:   66  Text Interpretation: Normal sinus rhythm Nonspecific ST abnormality Abnormal QRS-T angle, consider primary T wave abnormality Abnormal ECG No significant change since last tracing Confirmed by Emil Share [phone]) on 06/12/2024 11:31:31 PM  Radiology: [facility] Chest 2 View Result Date: 06/12/2024 EXAM: 2 VIEW(S) XRAY OF THE CHEST 06/12/2024 11:20:00 PM COMPARISON: 05/31/2024 CLINICAL HISTORY: chest pain FINDINGS: LUNGS AND PLEURA: Scattered mild patchy/interstitial opacities in the lungs bilaterally, new/progressive from prior, favoring mild multifocal infection/pneumonia. No pleural effusion. No pneumothorax. HEART AND MEDIASTINUM: No acute abnormality of the cardiac and mediastinal silhouettes. BONES AND SOFT TISSUES: No acute osseous abnormality. IMPRESSION: 1. Scattered mild patchy/interstitial opacities in the lungs bilaterally, favoring mild multifocal infection/pneumonia. Electronically signed by: Pinkie Pebbles MD MD 06/12/2024 11:24 PM EST RP Workstation: [id number]     .Critical Care  Performed by: Emil Share, DO Authorized by: Emil Share, DO   Critical care provider statement:    Critical care time (minutes):  35   Critical care time was exclusive of:  Separately billable procedures and treating other patients   Critical care was time spent personally by me  on the following activities:  Development of treatment plan with patient or surrogate, discussions with consultants, evaluation of patient's response to treatment, examination of patient, ordering and review of laboratory studies, ordering and review of radiographic studies, ordering and performing treatments and interventions, pulse oximetry, re-evaluation of patient's condition and review of old charts   Care discussed with: admitting provider      Medications Ordered in the ED  cefTRIAXone  (ROCEPHIN ) 2 g in sodium chloride  0.9 % 100 mL IVPB (0 g Intravenous Stopped 06/13/24 0022)  azithromycin  (ZITHROMAX ) 500 mg in sodium chloride  0.9 % 250 mL IVPB (500 mg Intravenous  New Bag/Given 06/13/24 0022)  ondansetron  (ZOFRAN ) 4 MG/2ML injection (  Given 06/13/24 0114)                                    Medical Decision Making Amount and/or Complexity of Data Reviewed Labs: ordered.  Risk Prescription drug management. Decision regarding hospitalization.  I signed for and saw the patient at 2330 39 yo M with a chief complaints of cough congestion shortness of breath going on for a couple weeks but worsening over the past 24 hours.  X-ray on my independent interpretation concerning for multifocal pneumonia.  Will start on antibiotics.  Patient mild hyperkalemia.  Mild fluid overload.  Discussed with nephrology for dialysis.  Will dialyze in the morning.  Medicine admit.  The patients results and plan were reviewed and discussed.   Any x-rays performed were independently reviewed by myself.   Differential diagnosis were considered with the presenting HPI.  Medications  cefTRIAXone  (ROCEPHIN ) 2 g in sodium chloride  0.9 % 100 mL IVPB (0 g Intravenous Stopped 06/13/24 0022)  azithromycin  (ZITHROMAX ) 500 mg in sodium chloride  0.9 % 250 mL IVPB (500 mg Intravenous New Bag/Given 06/13/24 0022)  ondansetron  (ZOFRAN ) 4 MG/2ML injection (  Given 06/13/24 0114)    Vitals:   06/12/24 2238 06/12/24 2253 06/12/24  2340  BP:  (!) 189/119 (!) 155/97  Pulse: 95  94  Resp: 20  20  Temp: 99 F (37.2 C)    SpO2: 100%  100%    Final diagnoses:  Essential hypertension  Depression, unspecified depression type  ESRD (end stage renal disease) (HCC)    Admission/ observation were discussed with the admitting physician, patient and/or family and they are comfortable with the plan.       Final diagnoses:  Essential hypertension  Depression, unspecified depression type  ESRD (end stage renal disease) [facility])    ED Discharge Orders     None          Emil Share, DO 06/13/24 0137    Emil Share, DO 06/13/24 9465  "

## 2024-06-12 NOTE — ED Notes (Signed)
 Security came in and spoke to him and he allowed RN to take blood pressure discussed why he is here.  He states that he has not been treated for the bronchitis and that he has not been to dialysis for two weeks.  Both of his legs are swollen to the point where he is limping as he walks.  Pt does appear to be responding to internal stimuli and is very labile in his moods.

## 2024-06-13 DIAGNOSIS — J188 Other pneumonia, unspecified organism: Secondary | ICD-10-CM | POA: Diagnosis not present

## 2024-06-13 LAB — CREATININE, SERUM
Creatinine, Ser: 14.2 mg/dL — ABNORMAL HIGH (ref 0.61–1.24)
GFR, Estimated: 4 mL/min — ABNORMAL LOW

## 2024-06-13 LAB — CBC
HCT: 29.2 % — ABNORMAL LOW (ref 39.0–52.0)
Hemoglobin: 9.1 g/dL — ABNORMAL LOW (ref 13.0–17.0)
MCH: 29.6 pg (ref 26.0–34.0)
MCHC: 31.2 g/dL (ref 30.0–36.0)
MCV: 95.1 fL (ref 80.0–100.0)
Platelets: 313 K/uL (ref 150–400)
RBC: 3.07 MIL/uL — ABNORMAL LOW (ref 4.22–5.81)
RDW: 15.2 % (ref 11.5–15.5)
WBC: 10.4 K/uL (ref 4.0–10.5)
nRBC: 0 % (ref 0.0–0.2)

## 2024-06-13 LAB — ETHANOL: Alcohol, Ethyl (B): <15 mg/dL

## 2024-06-13 LAB — HIV ANTIBODY (ROUTINE TESTING W REFLEX): HIV Screen 4th Generation wRfx: NONREACTIVE

## 2024-06-13 LAB — HEPATITIS B SURFACE ANTIGEN: Hepatitis B Surface Ag: NONREACTIVE

## 2024-06-13 LAB — TROPONIN T, HIGH SENSITIVITY: Troponin T High Sensitivity: 112 ng/L (ref 0–19)

## 2024-06-13 LAB — I-STAT CG4 LACTIC ACID, ED: Lactic Acid, Venous: 0.4 mmol/L — ABNORMAL LOW (ref 0.5–1.9)

## 2024-06-13 MED ORDER — CHLORHEXIDINE GLUCONATE CLOTH 2 % EX PADS: 6.0000 | MEDICATED_PAD | Freq: Every day | CUTANEOUS | Status: DC

## 2024-06-13 MED ORDER — ONDANSETRON HCL 4 MG/2ML IJ SOLN
INTRAMUSCULAR | Status: AC
  Filled 2024-06-13: qty 2

## 2024-06-13 MED ORDER — AZITHROMYCIN 250 MG PO TABS: 250.0000 mg | ORAL_TABLET | Freq: Every day | ORAL | 0 refills | Status: AC

## 2024-06-13 MED ORDER — AZITHROMYCIN 250 MG PO TABS: 500.0000 mg | ORAL_TABLET | Freq: Every day | ORAL | Status: DC

## 2024-06-13 MED ORDER — SODIUM CHLORIDE 0.9 % IV SOLN: 500.0000 mg | INTRAVENOUS | Status: DC

## 2024-06-13 MED ORDER — AMOXICILLIN-POT CLAVULANATE 875-125 MG PO TABS: 1.0000 | ORAL_TABLET | Freq: Two times a day (BID) | ORAL | 0 refills | Status: AC

## 2024-06-13 MED ORDER — BENZTROPINE MESYLATE 1 MG PO TABS
1.0000 mg | ORAL_TABLET | Freq: Two times a day (BID) | ORAL | Status: DC
  Filled 2024-06-13: qty 1

## 2024-06-13 MED ORDER — CITALOPRAM HYDROBROMIDE 10 MG PO TABS: 20.0000 mg | ORAL_TABLET | Freq: Every morning | ORAL | Status: DC

## 2024-06-13 MED ORDER — AMLODIPINE BESYLATE 5 MG PO TABS: 5.0000 mg | ORAL_TABLET | Freq: Every day | ORAL | Status: DC

## 2024-06-13 MED ORDER — CINACALCET HCL 30 MG PO TABS: 30.0000 mg | ORAL_TABLET | Freq: Every evening | ORAL | Status: DC

## 2024-06-13 MED ORDER — SODIUM CHLORIDE 0.9 % IV SOLN: 2.0000 g | INTRAVENOUS | Status: DC

## 2024-06-13 MED ORDER — HEPARIN SODIUM (PORCINE) 5000 UNIT/ML IJ SOLN
5000.0000 [IU] | Freq: Three times a day (TID) | INTRAMUSCULAR | Status: DC
  Administered 2024-06-13: 5000 [IU] via SUBCUTANEOUS
  Filled 2024-06-13: qty 1

## 2024-06-13 NOTE — ED Notes (Signed)
 Patient uncooperative. Refuses to allow blood draw. Stands up on side of bed putting hands in pants saying he was going to leave because he did not have bugs until he came here.

## 2024-06-13 NOTE — Discharge Summary (Signed)
 Physician Discharge Summary  CORKY BLUMSTEIN FMW:[id number] DOB: 09-11-1985 DOA: 06/12/2024  PCP: Freddrick, No  Admit date: 06/12/2024 Discharge date: 06/13/2024  Admitted From: Home Disposition: Patient left AGAINST MEDICAL ADVICE  Brief/Interim Summary: Larence E Anzalone is a 39 y.o. male with medical history significant of homelessness, end-stage renal disease on hemodialysis Tuesdays Thursdays Saturdays who has established noncompliance, essential hypertension, history of tobacco abuse, history of alcohol abuse, history of cocaine  abuse in remission, osteoarthritis and asthma who presented to the ER with complaint that he has not been to dialysis for at least 2 weeks with worsening lower extremity edema cough and shortness of breath.  In ED patient noted to be hyperkalemic, nephrology was consulted for hemodialysis, patient remained clinically stable, unfortunately prior to dialysis and further evaluation patient left hospital AGAINST MEDICAL ADVICE.  Lengthy discussion with patient at bedside in regards to his high risk for decompensation increased mortality morbidity and high likelihood of cardiac event and death should he remain hyperkalemic without treatment.  Patient replied just give me some crutches so I can walk at which time he left the hospital AGAINST MEDICAL ADVICE under his own power without any ambulatory assistance.  We encouraged the patient to return back to the hospital as soon as possible for further evaluation and treatment.  Discharge Diagnoses:  Principal Problem:   Multifocal pneumonia Active Problems:   End stage renal disease on dialysis [facility])   Essential hypertension   Tobacco abuse   MYOCARDIAL INFARCTION, HX OF   GERD (gastroesophageal reflux disease)   Cocaine  abuse in remission [facility])    Discharge Instructions   Allergies as of 06/13/2024       Reactions   Aspirin Swelling   Ibuprofen  Rash        Medication List     TAKE these medications     amoxicillin -clavulanate 875-125 MG tablet Commonly known as: AUGMENTIN  Take 1 tablet by mouth every 12 (twelve) hours.   azithromycin  250 MG tablet Commonly known as: ZITHROMAX  Take 1 tablet (250 mg total) by mouth daily. Take first 2 tablets together, then 1 every day until finished.       ASK your doctor about these medications    acetaminophen  500 MG tablet Commonly known as: TYLENOL  Take 500 mg by mouth every 6 (six) hours as needed for moderate pain.   amLODipine  5 MG tablet Commonly known as: NORVASC  Take 1 tablet (5 mg total) by mouth daily.   benztropine  1 MG tablet Commonly known as: COGENTIN  Take 1 mg by mouth 2 (two) times daily. The timing of this medication is very important.   cinacalcet  30 MG tablet Commonly known as: SENSIPAR  Take 1 tablet (30 mg total) by mouth every evening.   citalopram  20 MG tablet Commonly known as: CELEXA  Take 1 tablet (20 mg total) by mouth every morning.   doxycycline  100 MG capsule Commonly known as: VIBRAMYCIN  Take 1 capsule (100 mg total) by mouth 2 (two) times daily.   ibuprofen  600 MG tablet Commonly known as: ADVIL  Take 1 tablet (600 mg total) by mouth every 8 (eight) hours as needed (severe pain 7-10).   meclizine  25 MG tablet Commonly known as: ANTIVERT  Take 1 tablet (25 mg total) by mouth 3 (three) times daily as needed for dizziness.   sevelamer carbonate 800 MG tablet Commonly known as: RENVELA Take 2,400 mg by mouth 3 (three) times daily with meals.   valbenazine  80 MG capsule Commonly known as: Ingrezza  Samples of this drug were given to  the patient, quantity 1, Lot Number [id number]        Follow-up Information     A family doc. Call in 1 week.   Why: Family doctor Contact information: Please call the hotline provided to get a doc if you dont have one.               Allergies[1]  Consultations: Nephrology   Procedures/Studies: DG Chest 2 View Result Date: 06/12/2024 EXAM: 2 VIEW(S)  XRAY OF THE CHEST 06/12/2024 11:20:00 PM COMPARISON: 05/31/2024 CLINICAL HISTORY: chest pain FINDINGS: LUNGS AND PLEURA: Scattered mild patchy/interstitial opacities in the lungs bilaterally, new/progressive from prior, favoring mild multifocal infection/pneumonia. No pleural effusion. No pneumothorax. HEART AND MEDIASTINUM: No acute abnormality of the cardiac and mediastinal silhouettes. BONES AND SOFT TISSUES: No acute osseous abnormality. IMPRESSION: 1. Scattered mild patchy/interstitial opacities in the lungs bilaterally, favoring mild multifocal infection/pneumonia. Electronically signed by: Pinkie Pebbles MD MD 06/12/2024 11:24 PM EST RP Workstation: [id number]   DG Chest 2 View Result Date: 05/31/2024 EXAM: 2 VIEW(S) XRAY OF THE CHEST 05/31/2024 12:50:00 AM COMPARISON: 05/29/2024 CLINICAL HISTORY: chest pain FINDINGS: LUNGS AND PLEURA: Low lung volumes. Mild bibasilar atelectasis. No pleural effusion. No pneumothorax. HEART AND MEDIASTINUM: No acute abnormality of the cardiac and mediastinal silhouettes.s BONES AND SOFT TISSUES: No acute osseous abnormality. IMPRESSION: 1. No acute cardiopulmonary abnormality. 2. Low lung volumes with mild bibasilar atelectasis. Electronically signed by: Dorethia Molt MD 05/31/2024 12:58 AM EST RP Workstation: [id number]   DG Chest Portable 1 View Result Date: 05/29/2024 CLINICAL DATA:  Shortness of breath.  Cough and fever. EXAM: PORTABLE CHEST 1 VIEW COMPARISON:  Most recent comparison 05/13/2024 FINDINGS: Mild cardiomegaly. Vascular congestion. No focal pulmonary opacity. No pleural effusion or pneumothorax. Right costophrenic angle not entirely included in the field of view. No acute osseous abnormalities. IMPRESSION: Mild cardiomegaly and vascular congestion. Electronically Signed   By: Andrea Gasman M.D.   On: 05/29/2024 20:47   DG Tibia/Fibula Left Result Date: 05/29/2024 EXAM: VIEW(S) XRAY OF THE LEFT TIBIA AND FIBULA 05/29/2024 07:52:00 PM  COMPARISON: None available. CLINICAL HISTORY: pain FINDINGS: BONES AND JOINTS: No acute fracture. No malalignment. SOFT TISSUES: Diffuse subcutaneous edema throughout the calf. IMPRESSION: 1. Diffuse subcutaneous edema throughout the calf. Electronically signed by: Norman Gatlin MD 05/29/2024 08:01 PM EST RP Workstation: [id number]   VAS US  LOWER EXTREMITY VENOUS (DVT) (7a-5p) Result Date: 05/27/2024  Lower Venous DVT Study Patient Name:  SHAHAN STARKS  Date of Exam:   05/27/2024 Medical Rec #: [id number]            Accession #:    [id number] Date of Birth: 30-Jun-1985            Patient Gender: M Patient Age:   33 years Exam Location:  [facility] Procedure:      VAS US  LOWER EXTREMITY VENOUS (DVT) Referring Phys: HAMP FONDAW --------------------------------------------------------------------------------  Indications: Pain.  Risk Factors: Trauma. Limitations: Poor ultrasound/tissue interface and patient positioning, poor patient cooperation, irritability. Comparison Study: 05/12/2024 - Negative for DVT bilaterally Performing Technologist: Cordella Collet RVT  Examination Guidelines: A complete evaluation includes B-mode imaging, spectral Doppler, color Doppler, and power Doppler as needed of all accessible portions of each vessel. Bilateral testing is considered an integral part of a complete examination. Limited examinations for reoccurring indications may be performed as noted. The reflux portion of the exam is performed with the patient in reverse Trendelenburg.  +-----+---------------+---------+-----------+----------+--------------+ RIGHTCompressibilityPhasicitySpontaneityPropertiesThrombus Aging +-----+---------------+---------+-----------+----------+--------------+ CFV  Refused        +-----+---------------+---------+-----------+----------+--------------+   +---------+---------------+---------+-----------+----------+--------------+  LEFT     CompressibilityPhasicitySpontaneityPropertiesThrombus Aging +---------+---------------+---------+-----------+----------+--------------+ CFV      Full           Yes      No                                  +---------+---------------+---------+-----------+----------+--------------+ SFJ      Full                                                        +---------+---------------+---------+-----------+----------+--------------+ FV Prox  Full                                                        +---------+---------------+---------+-----------+----------+--------------+ FV Mid   Full                                                        +---------+---------------+---------+-----------+----------+--------------+ FV DistalFull                                                        +---------+---------------+---------+-----------+----------+--------------+ PFV      Full                                                        +---------+---------------+---------+-----------+----------+--------------+ POP      Full           Yes      No                                  +---------+---------------+---------+-----------+----------+--------------+ PTV      Full                                                        +---------+---------------+---------+-----------+----------+--------------+ PERO     Full                                                        +---------+---------------+---------+-----------+----------+--------------+    Summary: LEFT: - There is no evidence of deep vein thrombosis in the lower extremity.  - No cystic structure found in the popliteal fossa.  *See table(s) above for  measurements and observations. Electronically signed by Lonni Gaskins MD on 05/27/2024 at 5:00:00 PM.    Final    DG Knee Complete 4 Views Left Result Date: 05/27/2024 CLINICAL DATA:  Leg pain. EXAM: LEFT KNEE - COMPLETE 4+ VIEW COMPARISON:  Fever  radiograph 04/12/2024 FINDINGS: No evidence of fracture, dislocation, or joint effusion. No evidence of arthropathy or other focal bone abnormality. Generalized soft tissue edema. IMPRESSION: Generalized soft tissue edema. No acute osseous abnormality. Electronically Signed   By: Andrea Gasman M.D.   On: 05/27/2024 00:17   DG Knee Complete 4 Views Right Result Date: 05/27/2024 CLINICAL DATA:  Leg pain EXAM: RIGHT KNEE - COMPLETE 4+ VIEW COMPARISON:  Tibia/fibula exam 01/11/2024 FINDINGS: No evidence of fracture, dislocation, or joint effusion. Chronic fragmentation of the anterior tibial tubercle. No evidence of arthropathy or other focal bone abnormality. Mild soft tissue edema. IMPRESSION: Mild soft tissue edema. No acute osseous abnormality. Electronically Signed   By: Andrea Gasman M.D.   On: 05/27/2024 00:16   DG Pelvis Portable Result Date: 05/27/2024 CLINICAL DATA:  Hip pain. EXAM: PORTABLE PELVIS 1-2 VIEWS COMPARISON:  Pelvis radiograph 04/12/2024 FINDINGS: The cortical margins of the bony pelvis are intact. No fracture. Pubic symphysis and sacroiliac joints are congruent. Both femoral heads are well-seated in the respective acetabula. No evidence of a vascular necrosis or focal bone abnormality. IMPRESSION: Negative radiograph of the pelvis. Electronically Signed   By: Andrea Gasman M.D.   On: 05/27/2024 00:15     Subjective: No acute issues or events noted overnight, patient determined to leave the hospital immediately, did not want to participate in physical exam or review of systems.  Patient would not explain why he was in a hurry to leave the hospital, but remained awake alert and oriented of sound mind to make his own decisions, even if we explained they are poor decisions given his risks outlined above.   Discharge Exam: Vitals:   06/13/24 0845 06/13/24 1051  BP: (!) 179/91   Pulse: 68   Resp: 18   Temp:  98.4 F (36.9 C)  SpO2: 100%    Vitals:   06/13/24 0744  06/13/24 0832 06/13/24 0845 06/13/24 1051  BP: (!) 179/99  (!) 179/91   Pulse: 83  68   Resp: 19  18   Temp: 98 F (36.7 C)   98.4 F (36.9 C)  TempSrc:      SpO2: 100%  100%   Weight:  88 kg    Height:  5' 5 (1.651 m)      General: Pt is alert, awake, not in acute distress Respiratory: Without accessory muscle use or dyspnea Abdominal: Nondistended Extremities: Diffuse edema in the lower extremities notable from across the room    The results of significant diagnostics from this hospitalization (including imaging, microbiology, ancillary and laboratory) are listed below for reference.     Microbiology: Recent Results (from the past 240 hours)  Blood culture (routine x 2)     Status: None (Preliminary result)   Collection Time: 06/12/24 11:35 PM   Specimen: BLOOD  Result Value Ref Range Status   Specimen Description BLOOD RIGHT ANTECUBITAL  Final   Special Requests   Final    BOTTLES DRAWN AEROBIC AND ANAEROBIC Blood Culture adequate volume   Culture   Final    NO GROWTH < 12 HOURS Performed at [facility], [address], [place], [place] [address]    Report Status PENDING  Incomplete  Blood culture (  routine x 2)     Status: None (Preliminary result)   Collection Time: 06/12/24 11:45 PM   Specimen: BLOOD RIGHT HAND  Result Value Ref Range Status   Specimen Description BLOOD RIGHT HAND  Final   Special Requests   Final    BOTTLES DRAWN AEROBIC AND ANAEROBIC Blood Culture adequate volume   Culture   Final    NO GROWTH < 12 HOURS Performed at [facility], [address], [place], [place] [address]    Report Status PENDING  Incomplete     Labs: BNP (last 3 results) No results for input(s): BNP in the last 8760 hours. Basic Metabolic Panel: Recent Labs  Lab 06/12/24 2251 06/13/24 0400  NA 144  --   K 5.5*  --   CL 110  --   CO2 15*  --   GLUCOSE 79  --   BUN 90*  --   CREATININE 14.60* 14.20*  CALCIUM 7.1*  --    Liver Function  Tests: No results for input(s): AST, ALT, ALKPHOS, BILITOT, PROT, ALBUMIN in the last 168 hours. No results for input(s): LIPASE, AMYLASE in the last 168 hours. No results for input(s): AMMONIA in the last 168 hours. CBC: Recent Labs  Lab 06/12/24 2251 06/13/24 0400  WBC 10.1 10.4  HGB 9.1* 9.1*  HCT 29.2* 29.2*  MCV 96.1 95.1  PLT 302 313   Cardiac Enzymes: No results for input(s): CKTOTAL, CKMB, CKMBINDEX, TROPONINI in the last 168 hours. BNP: Invalid input(s): POCBNP CBG: No results for input(s): GLUCAP in the last 168 hours. D-Dimer No results for input(s): DDIMER in the last 72 hours. Hgb A1c No results for input(s): HGBA1C in the last 72 hours. Lipid Profile No results for input(s): CHOL, HDL, LDLCALC, TRIG, CHOLHDL, LDLDIRECT in the last 72 hours. Thyroid function studies No results for input(s): TSH, T4TOTAL, T3FREE, THYROIDAB in the last 72 hours.  Invalid input(s): FREET3 Anemia work up No results for input(s): VITAMINB12, FOLATE, FERRITIN, TIBC, IRON , RETICCTPCT in the last 72 hours. Urinalysis    Component Value Date/Time   COLORURINE STRAW (A) 08/02/2023 1442   APPEARANCEUR CLEAR 08/02/2023 1442   LABSPEC 1.009 08/02/2023 1442   LABSPEC 1.015 03/29/2022 1118   PHURINE 8.0 08/02/2023 1442   GLUCOSEU 150 (A) 08/02/2023 1442   HGBUR SMALL (A) 08/02/2023 1442   HGBUR negative 12/15/2008 1313   BILIRUBINUR NEGATIVE 08/02/2023 1442   BILIRUBINUR negative 03/29/2022 1118   KETONESUR NEGATIVE 08/02/2023 1442   PROTEINUR 100 (A) 08/02/2023 1442   UROBILINOGEN 1.0 04/21/2009 0315   NITRITE NEGATIVE 08/02/2023 1442   LEUKOCYTESUR NEGATIVE 08/02/2023 1442   Sepsis Labs Recent Labs  Lab 06/12/24 2251 06/13/24 0400  WBC 10.1 10.4   Microbiology Recent Results (from the past 240 hours)  Blood culture (routine x 2)     Status: None (Preliminary result)   Collection Time: 06/12/24 11:35 PM    Specimen: BLOOD  Result Value Ref Range Status   Specimen Description BLOOD RIGHT ANTECUBITAL  Final   Special Requests   Final    BOTTLES DRAWN AEROBIC AND ANAEROBIC Blood Culture adequate volume   Culture   Final    NO GROWTH < 12 HOURS Performed at [facility], [address] [place], [place] [address]    Report Status PENDING  Incomplete  Blood culture (routine x 2)     Status: None (Preliminary result)   Collection Time: 06/12/24 11:45 PM   Specimen: BLOOD RIGHT HAND  Result Value Ref Range Status   Specimen Description BLOOD RIGHT HAND  Final   Special Requests   Final    BOTTLES DRAWN AEROBIC AND ANAEROBIC Blood Culture adequate volume   Culture   Final    NO GROWTH < 12 HOURS Performed at [facility], [address], [place], [place] [address]    Report Status PENDING  Incomplete     Time coordinating discharge: Over 30 minutes  SIGNED:   Elsie JAYSON Montclair, DO [facility] 06/13/2024, 4:53 PM Pager   If 7PM-7AM, please contact night-coverage [email or web address]     [1]  Allergies Allergen Reactions   Aspirin Swelling   Ibuprofen  Rash

## 2024-06-13 NOTE — ED Notes (Signed)
 Attempted to check pts pulse ox when ambulating , pt refusing to get up. Says he cannot walk and will not walk due to leg pain. RN aware, and will attempt again.

## 2024-06-13 NOTE — Discharge Instructions (Signed)
 Please with your family doctor in the office.  Please return if you would like to be reevaluated.  Please follow-up with your dialysis center.

## 2024-06-13 NOTE — ED Notes (Signed)
 Pt had an episode of nausea. Zofran  requested by MD and administered. Pt proceeded to get out of bed, take all of his leads and monitoring devices off to throw up in a emesis bag. Pt proceeds to tell this RN he has to have a bowel movement. Pt states that he feels dizzy. I instructed pt to use a bedside commode which was in the room it was not safe to walk without grip socks or shoes and not safe as he was swaying and feeling dizzy. Pt refused, demanded to go to the bathroom and left the room to go to the bathroom. Charge RN notified and MD notified of situation. Pt after using the restroom, gets back into bed and allows the leads and monitoring devices back on pt and continue antibiotic treatment. Pt continues to refuse to put a gown on or grip socks. I gave pt a new warm blanket, emesis bag, and beverage for this pt.

## 2024-06-13 NOTE — ED Notes (Signed)
 Pt up to restroom and complaining of leg pain will not allow pulse ox to be checked. Pt also agitated due to his breakfast tray not being anything he liked.

## 2024-06-13 NOTE — ED Notes (Addendum)
 Patient had an episode of agitation earlier with security assisting with patient care. Patient wanted to leave AMA but decided to stay. Patient allowed care to continue but removed BP cuff. At this time patient in bed asleep.

## 2024-06-13 NOTE — ED Notes (Signed)
 When pt arrives in the room, behavior, he continuously scratches at his ear and body. I asked if he had bedbugs, pt declines, and none visibly seen, and proceeded to ask if pt was seeing bugs, pt says yes. Per Pt, he has been off his mental health meds for a while. Pt continues to scratch at body. Pt originally refused to wear monitoring devices or leads, but after educating pt, he agreed to leave leads on. Pt refuses to wear grip socks or a gown provided. Pt is in his under garments with a warm blanket.

## 2024-06-14 ENCOUNTER — Other Ambulatory Visit: Payer: Self-pay

## 2024-06-14 ENCOUNTER — Encounter (HOSPITAL_COMMUNITY): Payer: Self-pay | Admitting: *Deleted

## 2024-06-14 ENCOUNTER — Emergency Department (HOSPITAL_COMMUNITY): Payer: MEDICAID

## 2024-06-14 ENCOUNTER — Emergency Department (HOSPITAL_COMMUNITY)
Admission: EM | Admit: 2024-06-14 | Discharge: 2024-06-15 | Disposition: A | Payer: MEDICAID | Attending: Emergency Medicine | Admitting: Emergency Medicine

## 2024-06-14 DIAGNOSIS — N186 End stage renal disease: Secondary | ICD-10-CM | POA: Insufficient documentation

## 2024-06-14 DIAGNOSIS — M79605 Pain in left leg: Secondary | ICD-10-CM | POA: Diagnosis not present

## 2024-06-14 DIAGNOSIS — Z992 Dependence on renal dialysis: Secondary | ICD-10-CM | POA: Insufficient documentation

## 2024-06-14 DIAGNOSIS — I120 Hypertensive chronic kidney disease with stage 5 chronic kidney disease or end stage renal disease: Secondary | ICD-10-CM | POA: Diagnosis present

## 2024-06-14 DIAGNOSIS — J45909 Unspecified asthma, uncomplicated: Secondary | ICD-10-CM | POA: Diagnosis not present

## 2024-06-14 DIAGNOSIS — M79604 Pain in right leg: Secondary | ICD-10-CM | POA: Diagnosis present

## 2024-06-14 DIAGNOSIS — F17200 Nicotine dependence, unspecified, uncomplicated: Secondary | ICD-10-CM | POA: Insufficient documentation

## 2024-06-14 DIAGNOSIS — Z79899 Other long term (current) drug therapy: Secondary | ICD-10-CM | POA: Insufficient documentation

## 2024-06-14 DIAGNOSIS — R600 Localized edema: Secondary | ICD-10-CM | POA: Diagnosis not present

## 2024-06-14 DIAGNOSIS — E8770 Fluid overload, unspecified: Secondary | ICD-10-CM

## 2024-06-14 DIAGNOSIS — E875 Hyperkalemia: Secondary | ICD-10-CM | POA: Diagnosis not present

## 2024-06-14 DIAGNOSIS — N50819 Testicular pain, unspecified: Secondary | ICD-10-CM | POA: Diagnosis not present

## 2024-06-14 DIAGNOSIS — N5089 Other specified disorders of the male genital organs: Secondary | ICD-10-CM | POA: Diagnosis not present

## 2024-06-14 NOTE — ED Provider Notes (Incomplete)
 " [place] EMERGENCY DEPARTMENT AT [facility] Provider Note   CSN: [id number] Arrival date & time: 06/14/24  2114     Patient presents with: Testicle Pain   William Gregory is a 39 y.o. male with history of alcohol abuse, tobacco abuse, depression, ESRD dialysis, GERD, marijuana abuse, cocaine  abuse, hypertension, schizophrenia.  Presents to ED complaining of testicular swelling, leg swelling.  Patient reports has not done dialysis in over a week.  Patient was recently admitted to hospital but left AMA.  Patient was admitted for dialysis.  Patient reports left AMA due to having some things on my mind.  On arrival, the patient is extremely somnolent.  He will arouse to stimuli but then falls back asleep quickly.  On further questioning, he reports he is here for testicular swelling, leg swelling.  Reports that he has not had dialysis in about a week.  Unwilling to answer any questions.  Slightly uncooperative, falling asleep intermittently. Denies any drugs tonight.  Oriented however somnolent.   Testicle Pain       Prior to Admission medications  Medication Sig Start Date End Date Taking? Authorizing Provider  doxycycline  (VIBRAMYCIN ) 100 MG capsule Take 1 capsule (100 mg total) by mouth 2 (two) times daily. 06/15/24  Yes Ruthell Lonni FALCON, PA-C  acetaminophen  (TYLENOL ) 500 MG tablet Take 500 mg by mouth every 6 (six) hours as needed for moderate pain.    [provider]  amLODipine  (NORVASC ) 5 MG tablet Take 1 tablet (5 mg total) by mouth daily. 04/14/24   Paseda, Folashade R, FNP  amoxicillin -clavulanate (AUGMENTIN ) 875-125 MG tablet Take 1 tablet by mouth every 12 (twelve) hours. 06/13/24   Emil Share, DO  azithromycin  (ZITHROMAX ) 250 MG tablet Take 1 tablet (250 mg total) by mouth daily. Take first 2 tablets together, then 1 every day until finished. 06/13/24   Emil Share, DO  benztropine  (COGENTIN ) 1 MG tablet Take 1 mg by mouth 2 (two) times daily.     [provider]  cinacalcet  (SENSIPAR ) 30 MG tablet Take 1 tablet (30 mg total) by mouth every evening. 11/16/23   Paseda, Folashade R, FNP  citalopram  (CELEXA ) 20 MG tablet Take 1 tablet (20 mg total) by mouth every morning. 11/16/23   Paseda, Folashade R, FNP  ibuprofen  (ADVIL ) 600 MG tablet Take 1 tablet (600 mg total) by mouth every 8 (eight) hours as needed (severe pain 7-10). 10/23/23   Paseda, Folashade R, FNP  meclizine  (ANTIVERT ) 25 MG tablet Take 1 tablet (25 mg total) by mouth 3 (three) times daily as needed for dizziness. 04/30/24   Vicky Charleston, PA-C  sevelamer carbonate (RENVELA) 800 MG tablet Take 2,400 mg by mouth 3 (three) times daily with meals.    [provider]  valbenazine  (INGREZZA ) 80 MG capsule Samples of this drug were given to the patient, quantity 1, Lot Number [id number] Patient taking differently: Take 80 mg by mouth daily. 11/27/22   TatAsberry RAMAN, DO    Allergies: Aspirin and Ibuprofen     Review of Systems  Genitourinary:  Positive for scrotal swelling.  All other systems reviewed and are negative.   Updated Vital Signs BP (!) 154/97   Pulse 90   Resp (!) 22   SpO2 100%   Physical Exam Vitals and nursing note reviewed.  Constitutional:      General: He is not in acute distress.    Appearance: He is well-developed.  HENT:     Head: Normocephalic and atraumatic.  Eyes:     Conjunctiva/sclera: Conjunctivae normal.  Cardiovascular:     Rate and Rhythm: Normal rate and regular rhythm.     Heart sounds: No murmur heard. Pulmonary:     Effort: Pulmonary effort is normal. No respiratory distress.     Breath sounds: Normal breath sounds.  Abdominal:     Palpations: Abdomen is soft.     Tenderness: There is no abdominal tenderness.  Musculoskeletal:        General: No swelling.     Cervical back: Neck supple.  Skin:    General: Skin is warm and dry.     Capillary Refill: Capillary refill takes less than 2 seconds.  Neurological:      Mental Status: He is alert and oriented to person, place, and time.  Psychiatric:        Mood and Affect: Mood normal.     (all labs ordered are listed, but only abnormal results are displayed) Labs Reviewed  CBC - Abnormal; Notable for the following components:      Result Value   RBC 2.92 (*)    Hemoglobin 8.7 (*)    HCT 27.5 (*)    All other components within normal limits  COMPREHENSIVE METABOLIC PANEL WITH GFR - Abnormal; Notable for the following components:   Potassium 5.2 (*)    Chloride 112 (*)    CO2 14 (*)    BUN 96 (*)    Creatinine, Ser 15.30 (*)    Calcium 7.2 (*)    Total Protein 6.2 (*)    Albumin 3.2 (*)    GFR, Estimated 4 (*)    Anion gap 19 (*)    All other components within normal limits  I-STAT VENOUS BLOOD GAS, ED - Abnormal; Notable for the following components:   pCO2, Ven 32.1 (*)    pO2, Ven 58 (*)    Bicarbonate 15.2 (*)    TCO2 16 (*)    Acid-base deficit 10.0 (*)    Calcium, Ion 0.95 (*)    HCT 26.0 (*)    Hemoglobin 8.8 (*)    All other components within normal limits  ETHANOL    EKG: None  Radiology: No results found.  Procedures   Medications Ordered in the ED  sodium zirconium cyclosilicate  (LOKELMA ) packet 10 g (has no administration in time range)  doxycycline  (VIBRA -TABS) tablet 100 mg (100 mg Oral Given 06/15/24 0535)    Medical Decision Making Amount and/or Complexity of Data Reviewed Labs: ordered. Radiology: ordered.  Risk Prescription drug management.   39 year old male presents for evaluation, in need of dialysis.  Chart reviewed.  Patient was seen here a few days ago and admitted to the hospital for dialysis as well as multifocal pneumonia but ultimately left AGAINST MEDICAL ADVICE after receiving IV antibiotics.  Patient reports he left AMA because I had some things on my mind.  Patient returns this evening complaining of swelling to his testicles and legs which he attributes to being volume overloaded.   Requesting dialysis.  The patient is somnolent on exam, falling asleep intermittently.  He will arouse to painful stimuli, become agitated that we have woken him up and then fall back asleep.  He is unwilling to give much information as to what brings him here tonight besides the fact that he thinks he needs dialysis.  He is alert to time, place, situation.  He denies any drugs or alcohol.  Given doxycycline  for multifocal pneumonia noted on last chest x-ray.  Labs  drawn.  CBC is without leukocytosis, baseline hemoglobin.  Metabolic panel with potassium 5.2, BUN 96, creatinine 15.3, 4 4, anion gap 19.  Patient in need of dialysis.  I-STAT VBG unremarkable.  Ethanol detectable.  Attempted to get ultrasound of scrotum but patient defers on this, uncooperative with ultrasound tech.  Attempted to get chest x-ray but patient again defers and refuses imaging.  Will consult with nephrology for dialysis.  Update: Spoke with Dr. Geralynn, nephrology, who reports no indication for emergent dialysis at this time. Dr. Geralynn has advised me to tell the patient to come back tomorrow for dialysis.  Attempted to temporize patient hyperkalemia with Lokelma  10 g prior to discharge however patient refused this as document by RN Lawernce.  Will provide him with doxycycline  prescription and advised patient to return to ED tomorrow for dialysis. Patient given initial dose of doxycyline here in ED prior to discharge.    Final diagnoses:  Hypervolemia, unspecified hypervolemia type    ED Discharge Orders          Ordered    doxycycline  (VIBRAMYCIN ) 100 MG capsule  2 times daily        06/15/24 0545                 Ruthell Lonni FALCON, PA-C 06/15/24 0601    Midge Golas, MD 06/15/24 [phone]  "
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
## 2024-06-15 ENCOUNTER — Emergency Department (HOSPITAL_COMMUNITY): Payer: MEDICAID

## 2024-06-15 ENCOUNTER — Emergency Department (HOSPITAL_COMMUNITY)
Admission: EM | Admit: 2024-06-15 | Discharge: 2024-06-16 | Disposition: A | Payer: MEDICAID | Attending: Emergency Medicine | Admitting: Emergency Medicine

## 2024-06-15 DIAGNOSIS — E875 Hyperkalemia: Secondary | ICD-10-CM | POA: Insufficient documentation

## 2024-06-15 DIAGNOSIS — J45909 Unspecified asthma, uncomplicated: Secondary | ICD-10-CM | POA: Insufficient documentation

## 2024-06-15 DIAGNOSIS — I120 Hypertensive chronic kidney disease with stage 5 chronic kidney disease or end stage renal disease: Secondary | ICD-10-CM | POA: Insufficient documentation

## 2024-06-15 DIAGNOSIS — F17200 Nicotine dependence, unspecified, uncomplicated: Secondary | ICD-10-CM | POA: Insufficient documentation

## 2024-06-15 DIAGNOSIS — M79605 Pain in left leg: Secondary | ICD-10-CM | POA: Insufficient documentation

## 2024-06-15 DIAGNOSIS — R600 Localized edema: Secondary | ICD-10-CM | POA: Insufficient documentation

## 2024-06-15 DIAGNOSIS — N186 End stage renal disease: Secondary | ICD-10-CM | POA: Insufficient documentation

## 2024-06-15 DIAGNOSIS — Z79899 Other long term (current) drug therapy: Secondary | ICD-10-CM | POA: Insufficient documentation

## 2024-06-15 DIAGNOSIS — N5089 Other specified disorders of the male genital organs: Secondary | ICD-10-CM | POA: Insufficient documentation

## 2024-06-15 DIAGNOSIS — M79604 Pain in right leg: Secondary | ICD-10-CM | POA: Insufficient documentation

## 2024-06-15 DIAGNOSIS — Z992 Dependence on renal dialysis: Secondary | ICD-10-CM | POA: Insufficient documentation

## 2024-06-15 LAB — BASIC METABOLIC PANEL WITH GFR
Anion gap: 19 — ABNORMAL HIGH (ref 5–15)
BUN: 97 mg/dL — ABNORMAL HIGH (ref 6–20)
CO2: 15 mmol/L — ABNORMAL LOW (ref 22–32)
Calcium: 7.1 mg/dL — ABNORMAL LOW (ref 8.9–10.3)
Chloride: 109 mmol/L (ref 98–111)
Creatinine, Ser: 16.3 mg/dL — ABNORMAL HIGH (ref 0.61–1.24)
GFR, Estimated: 3 mL/min — ABNORMAL LOW
Glucose, Bld: 82 mg/dL (ref 70–99)
Potassium: 5.4 mmol/L — ABNORMAL HIGH (ref 3.5–5.1)
Sodium: 144 mmol/L (ref 135–145)

## 2024-06-15 LAB — I-STAT VENOUS BLOOD GAS, ED
Acid-base deficit: 10 mmol/L — ABNORMAL HIGH (ref 0.0–2.0)
Bicarbonate: 15.2 mmol/L — ABNORMAL LOW (ref 20.0–28.0)
Calcium, Ion: 0.95 mmol/L — ABNORMAL LOW (ref 1.15–1.40)
HCT: 26 % — ABNORMAL LOW (ref 39.0–52.0)
Hemoglobin: 8.8 g/dL — ABNORMAL LOW (ref 13.0–17.0)
O2 Saturation: 87 %
Potassium: 4.9 mmol/L (ref 3.5–5.1)
Sodium: 144 mmol/L (ref 135–145)
TCO2: 16 mmol/L — ABNORMAL LOW (ref 22–32)
pCO2, Ven: 32.1 mmHg — ABNORMAL LOW (ref 44–60)
pH, Ven: 7.284 (ref 7.25–7.43)
pO2, Ven: 58 mmHg — ABNORMAL HIGH (ref 32–45)

## 2024-06-15 LAB — CBC
HCT: 27.5 % — ABNORMAL LOW (ref 39.0–52.0)
HCT: 28.2 % — ABNORMAL LOW (ref 39.0–52.0)
Hemoglobin: 8.7 g/dL — ABNORMAL LOW (ref 13.0–17.0)
Hemoglobin: 8.8 g/dL — ABNORMAL LOW (ref 13.0–17.0)
MCH: 29.8 pg (ref 26.0–34.0)
MCH: 29.8 pg (ref 26.0–34.0)
MCHC: 31.2 g/dL (ref 30.0–36.0)
MCHC: 31.6 g/dL (ref 30.0–36.0)
MCV: 94.2 fL (ref 80.0–100.0)
MCV: 95.6 fL (ref 80.0–100.0)
Platelets: 286 K/uL (ref 150–400)
Platelets: 288 K/uL (ref 150–400)
RBC: 2.92 MIL/uL — ABNORMAL LOW (ref 4.22–5.81)
RBC: 2.95 MIL/uL — ABNORMAL LOW (ref 4.22–5.81)
RDW: 15.1 % (ref 11.5–15.5)
RDW: 15.4 % (ref 11.5–15.5)
WBC: 8.9 K/uL (ref 4.0–10.5)
WBC: 9.2 K/uL (ref 4.0–10.5)
nRBC: 0 % (ref 0.0–0.2)
nRBC: 0 % (ref 0.0–0.2)

## 2024-06-15 LAB — COMPREHENSIVE METABOLIC PANEL WITH GFR
ALT: 27 U/L (ref 0–44)
AST: 26 U/L (ref 15–41)
Albumin: 3.2 g/dL — ABNORMAL LOW (ref 3.5–5.0)
Alkaline Phosphatase: 106 U/L (ref 38–126)
Anion gap: 19 — ABNORMAL HIGH (ref 5–15)
BUN: 96 mg/dL — ABNORMAL HIGH (ref 6–20)
CO2: 14 mmol/L — ABNORMAL LOW (ref 22–32)
Calcium: 7.2 mg/dL — ABNORMAL LOW (ref 8.9–10.3)
Chloride: 112 mmol/L — ABNORMAL HIGH (ref 98–111)
Creatinine, Ser: 15.3 mg/dL — ABNORMAL HIGH (ref 0.61–1.24)
GFR, Estimated: 4 mL/min — ABNORMAL LOW
Glucose, Bld: 93 mg/dL (ref 70–99)
Potassium: 5.2 mmol/L — ABNORMAL HIGH (ref 3.5–5.1)
Sodium: 145 mmol/L (ref 135–145)
Total Bilirubin: 0.3 mg/dL (ref 0.0–1.2)
Total Protein: 6.2 g/dL — ABNORMAL LOW (ref 6.5–8.1)

## 2024-06-15 LAB — ETHANOL: Alcohol, Ethyl (B): <15 mg/dL

## 2024-06-15 MED ORDER — SODIUM ZIRCONIUM CYCLOSILICATE 10 G PO PACK
10.0000 g | PACK | ORAL | Status: DC
  Filled 2024-06-15: qty 1

## 2024-06-15 MED ORDER — DOXYCYCLINE HYCLATE 100 MG PO TABS
100.0000 mg | ORAL_TABLET | Freq: Once | ORAL | Status: AC
  Administered 2024-06-15: 100 mg via ORAL
  Filled 2024-06-15: qty 1

## 2024-06-15 MED ORDER — SODIUM ZIRCONIUM CYCLOSILICATE 10 G PO PACK
10.0000 g | PACK | ORAL | Status: AC
  Administered 2024-06-15: 10 g via ORAL
  Filled 2024-06-15: qty 1

## 2024-06-15 MED ORDER — DOXYCYCLINE HYCLATE 100 MG PO CAPS: 100.0000 mg | ORAL_CAPSULE | Freq: Two times a day (BID) | ORAL | 0 refills | Status: AC

## 2024-06-15 NOTE — ED Provider Notes (Incomplete)
 " [place] Gregory AT [facility] Provider Note   CSN: [id number] Arrival date & time: 06/15/24  1806     Patient presents with: Leg Pain   Octavis E Gendron is a 39 y.o. male with history of alcohol abuse, chronic kidney disease ESRD, cocaine  abuse in remission, bilateral leg edema, asthma, marijuana use, tobacco abuse, hypertension.  Presents to ED complaining of leg pain, scrotal swelling, swollen legs.  Patient reports he is here for dialysis.  States that he has not had dialysis in quite some time.  Patient was recently mated to hospital for multifocal pneumonia as well as needing dialysis but left AMA.  Patient was seen last night at [facility].  Nephrology was consulted but patient had no emergent indications for dialysis and he was discharged with instructions to return Monday morning to [facility] for dialysis.  Denies shortness of breath, lightheadedness or dizziness.   Leg Pain      Prior to Admission medications  Medication Sig Start Date End Date Taking? Authorizing Provider  acetaminophen  (TYLENOL ) 500 MG tablet Take 500 mg by mouth every 6 (six) hours as needed for moderate pain.    [provider]  amLODipine  (NORVASC ) 5 MG tablet Take 1 tablet (5 mg total) by mouth daily. 04/14/24   Paseda, Folashade R, FNP  amoxicillin -clavulanate (AUGMENTIN ) 875-125 MG tablet Take 1 tablet by mouth every 12 (twelve) hours. 06/13/24   Emil Share, DO  azithromycin  (ZITHROMAX ) 250 MG tablet Take 1 tablet (250 mg total) by mouth daily. Take first 2 tablets together, then 1 every day until finished. 06/13/24   Emil Share, DO  benztropine  (COGENTIN ) 1 MG tablet Take 1 mg by mouth 2 (two) times daily.    [provider]  cinacalcet  (SENSIPAR ) 30 MG tablet Take 1 tablet (30 mg total) by mouth every evening. 11/16/23   Paseda, Folashade R, FNP  citalopram  (CELEXA ) 20 MG tablet Take 1 tablet (20 mg total) by mouth every morning. 11/16/23   Paseda, Folashade R,  FNP  doxycycline  (VIBRAMYCIN ) 100 MG capsule Take 1 capsule (100 mg total) by mouth 2 (two) times daily. 06/15/24   Ruthell Lonni FALCON, PA-C  ibuprofen  (ADVIL ) 600 MG tablet Take 1 tablet (600 mg total) by mouth every 8 (eight) hours as needed (severe pain 7-10). 10/23/23   Paseda, Folashade R, FNP  meclizine  (ANTIVERT ) 25 MG tablet Take 1 tablet (25 mg total) by mouth 3 (three) times daily as needed for dizziness. 04/30/24   Vicky Charleston, PA-C  sevelamer carbonate (RENVELA) 800 MG tablet Take 2,400 mg by mouth 3 (three) times daily with meals.    [provider]  valbenazine  (INGREZZA ) 80 MG capsule Samples of this drug were given to the patient, quantity 1, Lot Number [id number] Patient taking differently: Take 80 mg by mouth daily. 11/27/22   Tat, Asberry RAMAN, DO    Allergies: Aspirin and Ibuprofen     Review of Systems  Updated Vital Signs BP (!) 169/121 (BP Location: Right Arm)   Pulse 79   Temp 97.9 F (36.6 C) (Axillary)   Resp 20   SpO2 95%   Physical Exam  (all labs ordered are listed, but only abnormal results are displayed) Labs Reviewed  CBC  BASIC METABOLIC PANEL WITH GFR    EKG: None  Radiology: No results found.  {Document cardiac monitor, telemetry assessment procedure when appropriate:32947} Procedures   Medications Ordered in the ED  doxycycline  (VIBRA -TABS) tablet 100 mg (has no administration in time  range)      {Click here for ABCD2, HEART and other calculators REFRESH Note before signing:1}                              Medical Decision Making Amount and/or Complexity of Data Reviewed Labs: ordered.  Risk Prescription drug management.   ***  {Document critical care time when appropriate  Document review of labs and clinical decision tools ie CHADS2VASC2, etc  Document your independent review of radiology images and any outside records  Document your discussion with family members, caretakers and with consultants  Document social  determinants of health affecting pt's care  Document your decision making why or why not admission, treatments were needed:32947:::1}   Final diagnoses:  None    ED Discharge Orders     None        "

## 2024-06-15 NOTE — ED Notes (Signed)
 Patient transported to ultrasound.
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
## 2024-06-15 NOTE — ED Notes (Signed)
 Pt refusing lokelma . Attempted pt education, pt still refused.

## 2024-06-15 NOTE — ED Provider Notes (Signed)
 " [place] EMERGENCY DEPARTMENT AT [facility] Provider Note   CSN: [id number] Arrival date & time: 06/15/24  1806     Patient presents with: Leg Pain   William Gregory is a 39 y.o. male with history of alcohol abuse, chronic kidney disease ESRD, cocaine  abuse in remission, bilateral leg edema, asthma, marijuana use, tobacco abuse, hypertension.  Presents to ED complaining of leg pain, scrotal swelling, swollen legs.  Patient reports he is here for dialysis.  States that he has not had dialysis in quite some time.  Patient was recently admitted to hospital for multifocal pneumonia as well as needing dialysis but left AMA.  Patient was seen last night at [facility].  Nephrology was consulted but patient had no emergent indications for dialysis based on lab evaluation and he was discharged with instructions to return Monday morning to [facility] for dialysis. Patient presents to [facility] requesting dialysis.  Denies shortness of breath, lightheadedness or dizziness.    Leg Pain      Prior to Admission medications  Medication Sig Start Date End Date Taking? Authorizing Provider  acetaminophen  (TYLENOL ) 500 MG tablet Take 500 mg by mouth every 6 (six) hours as needed for moderate pain.    [provider]  amLODipine  (NORVASC ) 5 MG tablet Take 1 tablet (5 mg total) by mouth daily. 04/14/24   Paseda, Folashade R, FNP  amoxicillin -clavulanate (AUGMENTIN ) 875-125 MG tablet Take 1 tablet by mouth every 12 (twelve) hours. 06/13/24   Emil Share, DO  azithromycin  (ZITHROMAX ) 250 MG tablet Take 1 tablet (250 mg total) by mouth daily. Take first 2 tablets together, then 1 every day until finished. 06/13/24   Emil Share, DO  benztropine  (COGENTIN ) 1 MG tablet Take 1 mg by mouth 2 (two) times daily.    [provider]  cinacalcet  (SENSIPAR ) 30 MG tablet Take 1 tablet (30 mg total) by mouth every evening. 11/16/23   Paseda, Folashade R, FNP  citalopram  (CELEXA ) 20 MG tablet  Take 1 tablet (20 mg total) by mouth every morning. 11/16/23   Paseda, Folashade R, FNP  doxycycline  (VIBRAMYCIN ) 100 MG capsule Take 1 capsule (100 mg total) by mouth 2 (two) times daily. 06/15/24   Ruthell Lonni FALCON, PA-C  ibuprofen  (ADVIL ) 600 MG tablet Take 1 tablet (600 mg total) by mouth every 8 (eight) hours as needed (severe pain 7-10). 10/23/23   Paseda, Folashade R, FNP  meclizine  (ANTIVERT ) 25 MG tablet Take 1 tablet (25 mg total) by mouth 3 (three) times daily as needed for dizziness. 04/30/24   Vicky Charleston, PA-C  sevelamer carbonate (RENVELA) 800 MG tablet Take 2,400 mg by mouth 3 (three) times daily with meals.    [provider]  valbenazine  (INGREZZA ) 80 MG capsule Samples of this drug were given to the patient, quantity 1, Lot Number [id number] Patient taking differently: Take 80 mg by mouth daily. 11/27/22   TatAsberry RAMAN, DO    Allergies: Aspirin and Ibuprofen     Review of Systems  All other systems reviewed and are negative.   Updated Vital Signs BP (!) 169/121 (BP Location: Right Arm)   Pulse 79   Temp 97.9 F (36.6 C) (Axillary)   Resp 20   SpO2 95%   Physical Exam Vitals and nursing note reviewed.  Constitutional:      General: He is not in acute distress.    Appearance: He is well-developed.  HENT:     Head: Normocephalic and atraumatic.  Eyes:  Conjunctiva/sclera: Conjunctivae normal.  Cardiovascular:     Rate and Rhythm: Normal rate and regular rhythm.     Heart sounds: No murmur heard. Pulmonary:     Effort: Pulmonary effort is normal. No respiratory distress.     Breath sounds: Normal breath sounds.  Abdominal:     Palpations: Abdomen is soft.     Tenderness: There is no abdominal tenderness.  Genitourinary:    Comments: No significant scrotal swelling appreciated. Scrotum nontender Musculoskeletal:        General: No swelling.     Cervical back: Neck supple.     Right lower leg: Edema present.     Left lower leg: Edema present.      Comments: 1+ pitting edema bilateral lower extremities  Skin:    General: Skin is warm and dry.     Capillary Refill: Capillary refill takes less than 2 seconds.  Neurological:     Mental Status: He is alert and oriented to person, place, and time. Mental status is at baseline.  Psychiatric:        Mood and Affect: Mood normal.     (all labs ordered are listed, but only abnormal results are displayed) Labs Reviewed  CBC - Abnormal; Notable for the following components:      Result Value   RBC 2.95 (*)    Hemoglobin 8.8 (*)    HCT 28.2 (*)    All other components within normal limits  BASIC METABOLIC PANEL WITH GFR - Abnormal; Notable for the following components:   Potassium 5.4 (*)    CO2 15 (*)    BUN 97 (*)    Creatinine, Ser 16.30 (*)    Calcium 7.1 (*)    GFR, Estimated 3 (*)    Anion gap 19 (*)    All other components within normal limits    EKG: None  Radiology: No results found.  Procedures   Medications Ordered in the ED  doxycycline  (VIBRA -TABS) tablet 100 mg (100 mg Oral Given 06/15/24 2330)  sodium zirconium cyclosilicate  (LOKELMA ) packet 10 g (10 g Oral Given 06/15/24 2349)     Medical Decision Making Amount and/or Complexity of Data Reviewed Labs: ordered.  Risk Prescription drug management.   This is a 39 year old male presenting to the ED due to leg swelling, scrotal swelling.  On exam, hypertensive to 169/121.  Afebrile, nontachycardic.  Lung sounds are clear bilaterally, no hypoxia.  Abdomen soft and compressible.  Neuroexam at baseline.  1+ pitting edema to bilateral lower extremities.  No significant scrotal swelling, no tenderness.  Overall patient nontoxic in appearance.  Ambulating throughout the department without difficulty.  Patient seen last night at [facility].  Patient did have consult with nephrology, Dr. Geralynn, who reported no indication at this time for emergent dialysis.  He requested patient return on Monday for dialysis.   This was communicated the patient.  Patient was offered Lokelma , doxycycline  but refused and discharged.  Patient returns this evening.  Labs were drawn.  His CBC is stable with stable hemoglobin, no leukocytosis.  Metabolic panel with potassium 5.4 which was treated with 10 g of Lokelma .  His creatinine is 16.3, BUN 97, GFR 3 and anion gap 19.  Patient has no emergent indications for dialysis at this time.  Patient was once again advised to return to [facility], ED in the morning for dialysis.  He was provided with a bus pass.  He was also reminded that his doxycycline  for his community-acquired pneumonia was  waiting for him at his pharmacy on file and he voiced understanding.  He was given a dose of doxycycline  prior to discharge deceiving.  Given food.  Patient discharged at this time, advised to return to [facility] in the morning for dialysis.    Final diagnoses:  Pain in both lower extremities    ED Discharge Orders     None          Ruthell Lonni JULIANNA DEVONNA 06/16/24 FONTAINE Land, April, MD 06/16/24 0330  "

## 2024-06-15 NOTE — ED Notes (Signed)
 Pt sleeping at this time.

## 2024-06-15 NOTE — ED Notes (Signed)
 Per EDP, pt will remain in the department resting until around 0530, and then he will be dialyzed.

## 2024-06-15 NOTE — Discharge Instructions (Addendum)
 As we discussed, you were seen tonight requesting dialysis.  We spoke with the on-call nephrologist, Dr. Geralynn, who reports that unfortunately there are no dialysis nurses available here on Sundays.  He reports that due to your potassium being only 5.2, you will need to return to the ED tomorrow for dialysis on Monday.  We have provided you with Lokelma  to help lower your potassium.  We are sending you home with a doxycycline  prescription for your community-acquired pneumonia that was noted on your chest x-ray on 1/8.  This was sent to your pharmacy on file, the [facility] on [address].  Please return to the ED tomorrow morning for dialysis on Monday.

## 2024-06-15 NOTE — ED Notes (Signed)
 Pt refused imaging and ultrasound.

## 2024-06-16 ENCOUNTER — Emergency Department (HOSPITAL_COMMUNITY)
Admission: EM | Admit: 2024-06-16 | Discharge: 2024-06-17 | Payer: MEDICAID | Attending: Emergency Medicine | Admitting: Emergency Medicine

## 2024-06-16 ENCOUNTER — Encounter (HOSPITAL_COMMUNITY): Payer: Self-pay

## 2024-06-16 ENCOUNTER — Other Ambulatory Visit: Payer: Self-pay

## 2024-06-16 DIAGNOSIS — Z5329 Procedure and treatment not carried out because of patient's decision for other reasons: Secondary | ICD-10-CM | POA: Diagnosis not present

## 2024-06-16 DIAGNOSIS — J45909 Unspecified asthma, uncomplicated: Secondary | ICD-10-CM | POA: Diagnosis not present

## 2024-06-16 DIAGNOSIS — Z992 Dependence on renal dialysis: Secondary | ICD-10-CM | POA: Diagnosis not present

## 2024-06-16 DIAGNOSIS — I120 Hypertensive chronic kidney disease with stage 5 chronic kidney disease or end stage renal disease: Secondary | ICD-10-CM | POA: Diagnosis present

## 2024-06-16 DIAGNOSIS — N186 End stage renal disease: Secondary | ICD-10-CM | POA: Diagnosis not present

## 2024-06-16 LAB — I-STAT CHEM 8, ED
BUN: 115 mg/dL — ABNORMAL HIGH (ref 6–20)
Calcium, Ion: 0.9 mmol/L — ABNORMAL LOW (ref 1.15–1.40)
Chloride: 116 mmol/L — ABNORMAL HIGH (ref 98–111)
Creatinine, Ser: 17.4 mg/dL — ABNORMAL HIGH (ref 0.61–1.24)
Glucose, Bld: 63 mg/dL — ABNORMAL LOW (ref 70–99)
HCT: 28 % — ABNORMAL LOW (ref 39.0–52.0)
Hemoglobin: 9.5 g/dL — ABNORMAL LOW (ref 13.0–17.0)
Potassium: 4.8 mmol/L (ref 3.5–5.1)
Sodium: 143 mmol/L (ref 135–145)
TCO2: 13 mmol/L — ABNORMAL LOW (ref 22–32)

## 2024-06-16 LAB — CBC
HCT: 29.6 % — ABNORMAL LOW (ref 39.0–52.0)
Hemoglobin: 9.1 g/dL — ABNORMAL LOW (ref 13.0–17.0)
MCH: 29.4 pg (ref 26.0–34.0)
MCHC: 30.7 g/dL (ref 30.0–36.0)
MCV: 95.5 fL (ref 80.0–100.0)
Platelets: 280 K/uL (ref 150–400)
RBC: 3.1 MIL/uL — ABNORMAL LOW (ref 4.22–5.81)
RDW: 15.5 % (ref 11.5–15.5)
WBC: 8.2 K/uL (ref 4.0–10.5)
nRBC: 0 % (ref 0.0–0.2)

## 2024-06-16 LAB — COMPREHENSIVE METABOLIC PANEL WITH GFR
ALT: 54 U/L — ABNORMAL HIGH (ref 0–44)
AST: 50 U/L — ABNORMAL HIGH (ref 15–41)
Albumin: 3.5 g/dL (ref 3.5–5.0)
Alkaline Phosphatase: 118 U/L (ref 38–126)
Anion gap: 22 — ABNORMAL HIGH (ref 5–15)
BUN: 104 mg/dL — ABNORMAL HIGH (ref 6–20)
CO2: 12 mmol/L — ABNORMAL LOW (ref 22–32)
Calcium: 7 mg/dL — ABNORMAL LOW (ref 8.9–10.3)
Chloride: 110 mmol/L (ref 98–111)
Creatinine, Ser: 15.8 mg/dL — ABNORMAL HIGH (ref 0.61–1.24)
GFR, Estimated: 4 mL/min — ABNORMAL LOW
Glucose, Bld: 61 mg/dL — ABNORMAL LOW (ref 70–99)
Potassium: 5 mmol/L (ref 3.5–5.1)
Sodium: 144 mmol/L (ref 135–145)
Total Bilirubin: 0.3 mg/dL (ref 0.0–1.2)
Total Protein: 6.7 g/dL (ref 6.5–8.1)

## 2024-06-16 LAB — HEPATITIS B SURFACE ANTIBODY, QUANTITATIVE: Hep B S AB Quant (Post): 129 m[IU]/mL

## 2024-06-16 MED ORDER — CHLORHEXIDINE GLUCONATE CLOTH 2 % EX PADS: 6.0000 | MEDICATED_PAD | Freq: Every day | CUTANEOUS | Status: DC

## 2024-06-16 MED ORDER — ACETAMINOPHEN 325 MG PO TABS
650.0000 mg | ORAL_TABLET | Freq: Once | ORAL | Status: DC
  Filled 2024-06-16: qty 2

## 2024-06-16 NOTE — ED Notes (Signed)
 Called PT twice for vitals check and no response.SABRASABRA

## 2024-06-16 NOTE — ED Notes (Signed)
 Pt called for vitals x3 no response.

## 2024-06-16 NOTE — ED Triage Notes (Signed)
 Pt came in via POV d/t missing his last 2 dialysis Tx & now has swelling in his testicles & has a rash around his penis. Does states that he has some SOB/CP. Endorses bil leg & scrotal pain 6/10 in triage.

## 2024-06-16 NOTE — Discharge Instructions (Signed)
 As we discussed, please arrive to [facility], ED in the morning for dialysis.  Please pick up your prescription of doxycycline  sent to your pharmacy.  Utilize bus pass provided to you for transportation to [facility].

## 2024-06-16 NOTE — ED Notes (Signed)
 Pt refused vital signs.

## 2024-06-16 NOTE — ED Notes (Signed)
 Called PT twice no answer or response.SABRA OTF

## 2024-06-16 NOTE — ED Provider Triage Note (Addendum)
 Emergency Medicine Provider Triage Evaluation Note  BARKLEY KRATOCHVIL , a 39 y.o. male  was evaluated in triage.  Pt complains of needing dialysis. He has missed several sessions. Was just seen at [facility] a little over 12 hours ago. Plan for for dialysis today. He denies any new or worsening symptoms since being seen in the ER.   Review of Systems  Positive:  Negative:  Physical Exam  There were no vitals taken for this visit. Gen:   Awake, no distress   Resp:  Normal effort  MSK:   Moves extremities without difficulty  Other:    Medical Decision Making  Medically screening exam initiated at 1:15 PM.  Appropriate orders placed.  Curly E Lamadrid was informed that the remainder of the evaluation will be completed by another provider, this initial triage assessment does not replace that evaluation, and the importance of remaining in the ED until their evaluation is complete.  Patient refused temperature. Reports he is here for dialysis   Bernis Ernst, PA-C 06/16/24 1316   3:13 PM Potassium WNL. Spoke with Dr. Dennise for dilaysis for the patient.    Bernis Ernst, PA-C 06/16/24 1514

## 2024-06-16 NOTE — ED Notes (Signed)
 Tech attempted to collect temperature and pt. Refused. Other vitals obtained

## 2024-06-16 NOTE — ED Triage Notes (Signed)
 Pt states he needs dialysis, has missed two treatments last week and states his legs and privates are swollen. Pt ignoring this nurse and had to be asked multiple times, yelling at registration.

## 2024-06-17 LAB — RENAL FUNCTION PANEL
Albumin: 3.3 g/dL — ABNORMAL LOW (ref 3.5–5.0)
Anion gap: 19 — ABNORMAL HIGH (ref 5–15)
BUN: 104 mg/dL — ABNORMAL HIGH (ref 6–20)
CO2: 12 mmol/L — ABNORMAL LOW (ref 22–32)
Calcium: 6.9 mg/dL — ABNORMAL LOW (ref 8.9–10.3)
Chloride: 113 mmol/L — ABNORMAL HIGH (ref 98–111)
Creatinine, Ser: 15.8 mg/dL — ABNORMAL HIGH (ref 0.61–1.24)
GFR, Estimated: 4 mL/min — ABNORMAL LOW
Glucose, Bld: 98 mg/dL (ref 70–99)
Phosphorus: 9.9 mg/dL — ABNORMAL HIGH (ref 2.5–4.6)
Potassium: 5.3 mmol/L — ABNORMAL HIGH (ref 3.5–5.1)
Sodium: 144 mmol/L (ref 135–145)

## 2024-06-17 LAB — CBC
HCT: 27.6 % — ABNORMAL LOW (ref 39.0–52.0)
Hemoglobin: 8.6 g/dL — ABNORMAL LOW (ref 13.0–17.0)
MCH: 29.4 pg (ref 26.0–34.0)
MCHC: 31.2 g/dL (ref 30.0–36.0)
MCV: 94.2 fL (ref 80.0–100.0)
Platelets: 280 K/uL (ref 150–400)
RBC: 2.93 MIL/uL — ABNORMAL LOW (ref 4.22–5.81)
RDW: 15.5 % (ref 11.5–15.5)
WBC: 7.7 K/uL (ref 4.0–10.5)
nRBC: 0 % (ref 0.0–0.2)

## 2024-06-17 MED ORDER — LIDOCAINE HCL (PF) 1 % IJ SOLN: 5.0000 mL | INTRAMUSCULAR | Status: DC | PRN

## 2024-06-17 MED ORDER — ALTEPLASE 2 MG IJ SOLR: 2.0000 mg | Freq: Once | INTRAMUSCULAR | Status: DC | PRN

## 2024-06-17 MED ORDER — ANTICOAGULANT SODIUM CITRATE 4% (200MG/5ML) IV SOLN: 5.0000 mL | Status: DC | PRN

## 2024-06-17 MED ORDER — HEPARIN SODIUM (PORCINE) 1000 UNIT/ML IJ SOLN
INTRAMUSCULAR | Status: AC
  Filled 2024-06-17: qty 3

## 2024-06-17 MED ORDER — HEPARIN SODIUM (PORCINE) 1000 UNIT/ML DIALYSIS: 1000.0000 [IU] | INTRAMUSCULAR | Status: DC | PRN

## 2024-06-17 MED ORDER — LIDOCAINE-PRILOCAINE 2.5-2.5 % EX CREA: 1.0000 | TOPICAL_CREAM | CUTANEOUS | Status: DC | PRN

## 2024-06-17 MED ORDER — PENTAFLUOROPROP-TETRAFLUOROETH EX AERO: 1.0000 | INHALATION_SPRAY | CUTANEOUS | Status: DC | PRN

## 2024-06-17 MED ORDER — HEPARIN SODIUM (PORCINE) 1000 UNIT/ML DIALYSIS: 1500.0000 [IU] | INTRAMUSCULAR | Status: DC | PRN

## 2024-06-17 MED ORDER — HEPARIN SODIUM (PORCINE) 1000 UNIT/ML DIALYSIS: 2500.0000 [IU] | Freq: Once | INTRAMUSCULAR | Status: DC

## 2024-06-17 MED ORDER — NEPRO/CARBSTEADY PO LIQD: 237.0000 mL | ORAL | Status: DC | PRN

## 2024-06-17 MED ORDER — HEPARIN SODIUM (PORCINE) 1000 UNIT/ML DIALYSIS: 3500.0000 [IU] | INTRAMUSCULAR | Status: DC | PRN

## 2024-06-17 MED ORDER — HEPARIN SODIUM (PORCINE) 1000 UNIT/ML DIALYSIS: 3000.0000 [IU] | Freq: Once | INTRAMUSCULAR | Status: DC

## 2024-06-17 NOTE — Progress Notes (Signed)
 Hemo comment:              Thirty minutes after the start of the treatment,patient awakened from sleep,complaining of leg cramping, UF  on hold for a while ,but every time we pulled fluid off from him,patient is yelling hard and cursing to the staffs,keep uf off for rest of his treatment.His behavior continue like this for more than an hour. His behavior was not directable,he became physically aggressive when he pulled forcefully his vitals signs tube like blood pressure cuff and pulse oximeter. At this time treatment terminated for patient's and staffs safety.Security was called in to ensure safety of everyone.

## 2024-06-17 NOTE — ED Notes (Addendum)
 Informed consent signed by patient and primary RN and at bedside.

## 2024-06-17 NOTE — Progress Notes (Signed)
" °   06/17/24 1100  Fistula / Graft Left Upper arm Arteriovenous fistula  No placement date or time found.   Placed prior to admission: Yes  Orientation: Left  Access Location: Upper arm  Access Type: Arteriovenous fistula  Site Condition No complications  Fistula / Graft Assessment Present;Thrill;Bruit  Status Deaccessed  Drainage Description None  Neurological  Level of Consciousness Alert  Respiratory  Respiratory Pattern Regular  Chest Assessment Chest expansion symmetrical  Bilateral Breath Sounds Clear;Diminished  R Upper  Breath Sounds Clear  L Upper Breath Sounds Clear  R Lower Breath Sounds Diminished  L Lower Breath Sounds Diminished  Cardiac  Pulse Regular  Heart Sounds S1, S2  Jugular Venous Distention (JVD) No  ECG Monitor Yes  Cardiac Rhythm NSR  Psychosocial  Psychosocial (WDL) X  Patient Behaviors Agitated;Irritable;Aggressive verbally;Aggressive physically  Needs Expressed Denies  Emotional support given Given to patient    "

## 2024-06-17 NOTE — ED Notes (Signed)
 Patient was violent in dialysis and was sent back to ER and was verbally abusive to staff and PA Beola said he could be escorted out

## 2024-06-17 NOTE — ED Provider Notes (Signed)
 " [place] [facility] AT [facility] Provider Note   CSN: [id number] Arrival date & time: 06/16/24  1253     Patient presents with: Needs Dialysis   William Gregory is a 39 y.o. male.    Illness Quality:  Has not gone to dialysis in 2 weeks on [address] I just don't trust them Severity:  Moderate Onset quality:  Gradual Duration:  2 weeks Timing:  Constant Progression:  Worsening Context:  ESRD, no dialysis in 2 weeks Relieved by:  Nothing Worsened by:  Nothing Ineffective treatments:  None Associated symptoms: no fever and no wheezing   Patient with ESRD with dialysis TTS not has not been in 2 week.     Past Medical History:  Diagnosis Date   Alcohol abuse    Arthritis    Asthma    Back pain    Chronic kidney disease (CKD)    Stage 5 Dialysis Tu/Th/Sa   Depression    GERD (gastroesophageal reflux disease)    Hypertension    Knee joint pain, left    Neck pain    Pneumonia    PONV (postoperative nausea and vomiting)    Psychiatric problem    unspecified by patient (receives injections bi-weekly)   Tobacco abuse      Prior to Admission medications  Medication Sig Start Date End Date Taking? Authorizing Provider  acetaminophen  (TYLENOL ) 500 MG tablet Take 500 mg by mouth every 6 (six) hours as needed for moderate pain.    [provider]  amLODipine  (NORVASC ) 5 MG tablet Take 1 tablet (5 mg total) by mouth daily. 04/14/24   Paseda, Folashade R, FNP  amoxicillin -clavulanate (AUGMENTIN ) 875-125 MG tablet Take 1 tablet by mouth every 12 (twelve) hours. 06/13/24   Emil Share, DO  azithromycin  (ZITHROMAX ) 250 MG tablet Take 1 tablet (250 mg total) by mouth daily. Take first 2 tablets together, then 1 every day until finished. 06/13/24   Emil Share, DO  benztropine  (COGENTIN ) 1 MG tablet Take 1 mg by mouth 2 (two) times daily.    [provider]  cinacalcet  (SENSIPAR ) 30 MG tablet Take 1 tablet (30 mg total) by mouth every  evening. 11/16/23   Paseda, Folashade R, FNP  citalopram  (CELEXA ) 20 MG tablet Take 1 tablet (20 mg total) by mouth every morning. 11/16/23   Paseda, Folashade R, FNP  doxycycline  (VIBRAMYCIN ) 100 MG capsule Take 1 capsule (100 mg total) by mouth 2 (two) times daily. 06/15/24   Ruthell Lonni FALCON, PA-C  ibuprofen  (ADVIL ) 600 MG tablet Take 1 tablet (600 mg total) by mouth every 8 (eight) hours as needed (severe pain 7-10). 10/23/23   Paseda, Folashade R, FNP  meclizine  (ANTIVERT ) 25 MG tablet Take 1 tablet (25 mg total) by mouth 3 (three) times daily as needed for dizziness. 04/30/24   Vicky Charleston, PA-C  sevelamer carbonate (RENVELA) 800 MG tablet Take 2,400 mg by mouth 3 (three) times daily with meals.    [provider]  valbenazine  (INGREZZA ) 80 MG capsule Samples of this drug were given to the patient, quantity 1, Lot Number [id number] Patient taking differently: Take 80 mg by mouth daily. 11/27/22   TatAsberry RAMAN, DO    Allergies: Aspirin and Ibuprofen     Review of Systems  Constitutional:  Negative for fever.  Respiratory:  Negative for wheezing and stridor.   All other systems reviewed and are negative.   Updated Vital Signs BP (!) 165/100 (BP Location: Right Arm)  Pulse 75   Temp 98 F (36.7 C)   Resp 20   Ht 5' 6 (1.676 m)   Wt 91.2 kg   SpO2 96%   BMI 32.44 kg/m   Physical Exam Vitals and nursing note reviewed.  Constitutional:      General: He is not in acute distress.    Appearance: He is well-developed. He is not diaphoretic.  HENT:     Head: Normocephalic and atraumatic.     Nose: Nose normal.  Eyes:     Conjunctiva/sclera: Conjunctivae normal.     Pupils: Pupils are equal, round, and reactive to light.  Cardiovascular:     Rate and Rhythm: Normal rate and regular rhythm.     Pulses: Normal pulses.     Heart sounds: Normal heart sounds.  Pulmonary:     Effort: Pulmonary effort is normal.     Breath sounds: Normal breath sounds. No wheezing or  rales.  Abdominal:     General: Abdomen is flat. Bowel sounds are normal.     Palpations: Abdomen is soft.     Tenderness: There is no abdominal tenderness. There is no guarding or rebound.  Musculoskeletal:        General: Normal range of motion.     Cervical back: Normal range of motion and neck supple.  Skin:    General: Skin is warm and dry.     Capillary Refill: Capillary refill takes less than 2 seconds.  Neurological:     General: No focal deficit present.     Mental Status: He is alert.     (all labs ordered are listed, but only abnormal results are displayed) Results for orders placed or performed during the hospital encounter of 06/16/24  Comprehensive metabolic panel   Collection Time: 06/16/24  2:02 PM  Result Value Ref Range   Sodium 144 135 - 145 mmol/L   Potassium 5.0 3.5 - 5.1 mmol/L   Chloride 110 98 - 111 mmol/L   CO2 12 (L) 22 - 32 mmol/L   Glucose, Bld 61 (L) 70 - 99 mg/dL   BUN 895 (H) 6 - 20 mg/dL   Creatinine, Ser 84.19 (H) 0.61 - 1.24 mg/dL   Calcium 7.0 (L) 8.9 - 10.3 mg/dL   Total Protein 6.7 6.5 - 8.1 g/dL   Albumin 3.5 3.5 - 5.0 g/dL   AST 50 (H) 15 - 41 U/L   ALT 54 (H) 0 - 44 U/L   Alkaline Phosphatase 118 38 - 126 U/L   Total Bilirubin 0.3 0.0 - 1.2 mg/dL   GFR, Estimated 4 (L) >60 mL/min   Anion gap 22 (H) 5 - 15  CBC   Collection Time: 06/16/24  2:02 PM  Result Value Ref Range   WBC 8.2 4.0 - 10.5 K/uL   RBC 3.10 (L) 4.22 - 5.81 MIL/uL   Hemoglobin 9.1 (L) 13.0 - 17.0 g/dL   HCT 70.3 (L) 60.9 - 47.9 %   MCV 95.5 80.0 - 100.0 fL   MCH 29.4 26.0 - 34.0 pg   MCHC 30.7 30.0 - 36.0 g/dL   RDW 84.4 88.4 - 84.4 %   Platelets 280 150 - 400 K/uL   nRBC 0.0 0.0 - 0.2 %  I-stat chem 8, ED (not at [facility], [facility] or [facility])   Collection Time: 06/16/24  2:25 PM  Result Value Ref Range   Sodium 143 135 - 145 mmol/L   Potassium 4.8 3.5 - 5.1 mmol/L   Chloride 116 (H) 98 -  111 mmol/L   BUN 115 (H) 6 - 20 mg/dL   Creatinine, Ser 82.59 (H) 0.61 - 1.24  mg/dL   Glucose, Bld 63 (L) 70 - 99 mg/dL   Calcium, Ion 9.09 (L) 1.15 - 1.40 mmol/L   TCO2 13 (L) 22 - 32 mmol/L   Hemoglobin 9.5 (L) 13.0 - 17.0 g/dL   HCT 71.9 (L) 60.9 - 47.9 %   DG Chest 2 View Result Date: 06/12/2024 EXAM: 2 VIEW(S) XRAY OF THE CHEST 06/12/2024 11:20:00 PM COMPARISON: 05/31/2024 CLINICAL HISTORY: chest pain FINDINGS: LUNGS AND PLEURA: Scattered mild patchy/interstitial opacities in the lungs bilaterally, new/progressive from prior, favoring mild multifocal infection/pneumonia. No pleural effusion. No pneumothorax. HEART AND MEDIASTINUM: No acute abnormality of the cardiac and mediastinal silhouettes. BONES AND SOFT TISSUES: No acute osseous abnormality. IMPRESSION: 1. Scattered mild patchy/interstitial opacities in the lungs bilaterally, favoring mild multifocal infection/pneumonia. Electronically signed by: Pinkie Pebbles MD MD 06/12/2024 11:24 PM EST RP Workstation: [id number]   DG Chest 2 View Result Date: 05/31/2024 EXAM: 2 VIEW(S) XRAY OF THE CHEST 05/31/2024 12:50:00 AM COMPARISON: 05/29/2024 CLINICAL HISTORY: chest pain FINDINGS: LUNGS AND PLEURA: Low lung volumes. Mild bibasilar atelectasis. No pleural effusion. No pneumothorax. HEART AND MEDIASTINUM: No acute abnormality of the cardiac and mediastinal silhouettes.s BONES AND SOFT TISSUES: No acute osseous abnormality. IMPRESSION: 1. No acute cardiopulmonary abnormality. 2. Low lung volumes with mild bibasilar atelectasis. Electronically signed by: Dorethia Molt MD 05/31/2024 12:58 AM EST RP Workstation: [id number]   DG Chest Portable 1 View Result Date: 05/29/2024 CLINICAL DATA:  Shortness of breath.  Cough and fever. EXAM: PORTABLE CHEST 1 VIEW COMPARISON:  Most recent comparison 05/13/2024 FINDINGS: Mild cardiomegaly. Vascular congestion. No focal pulmonary opacity. No pleural effusion or pneumothorax. Right costophrenic angle not entirely included in the field of view. No acute osseous abnormalities. IMPRESSION:  Mild cardiomegaly and vascular congestion. Electronically Signed   By: Andrea Gasman M.D.   On: 05/29/2024 20:47   DG Tibia/Fibula Left Result Date: 05/29/2024 EXAM: VIEW(S) XRAY OF THE LEFT TIBIA AND FIBULA 05/29/2024 07:52:00 PM COMPARISON: None available. CLINICAL HISTORY: pain FINDINGS: BONES AND JOINTS: No acute fracture. No malalignment. SOFT TISSUES: Diffuse subcutaneous edema throughout the calf. IMPRESSION: 1. Diffuse subcutaneous edema throughout the calf. Electronically signed by: Norman Gatlin MD 05/29/2024 08:01 PM EST RP Workstation: [id number]   VAS US  LOWER EXTREMITY VENOUS (DVT) (7a-5p) Result Date: 05/27/2024  Lower Venous DVT Study Patient Name:  GRECO GASTELUM  Date of Exam:   05/27/2024 Medical Rec #: [id number]            Accession #:    [id number] Date of Birth: [birth year]            Patient Gender: M Patient Age:   33 years Exam Location:  [facility] Procedure:      VAS US  LOWER EXTREMITY VENOUS (DVT) Referring Phys: HAMP FONDAW --------------------------------------------------------------------------------  Indications: Pain.  Risk Factors: Trauma. Limitations: Poor ultrasound/tissue interface and patient positioning, poor patient cooperation, irritability. Comparison Study: 05/12/2024 - Negative for DVT bilaterally Performing Technologist: Cordella Collet RVT  Examination Guidelines: A complete evaluation includes B-mode imaging, spectral Doppler, color Doppler, and power Doppler as needed of all accessible portions of each vessel. Bilateral testing is considered an integral part of a complete examination. Limited examinations for reoccurring indications may be performed as noted. The reflux portion of the exam is performed with the patient in reverse Trendelenburg.  +-----+---------------+---------+-----------+----------+--------------+ RIGHTCompressibilityPhasicitySpontaneityPropertiesThrombus Aging  +-----+---------------+---------+-----------+----------+--------------+ CFV  Refused        +-----+---------------+---------+-----------+----------+--------------+   +---------+---------------+---------+-----------+----------+--------------+ LEFT     CompressibilityPhasicitySpontaneityPropertiesThrombus Aging +---------+---------------+---------+-----------+----------+--------------+ CFV      Full           Yes      No                                  +---------+---------------+---------+-----------+----------+--------------+ SFJ      Full                                                        +---------+---------------+---------+-----------+----------+--------------+ FV Prox  Full                                                        +---------+---------------+---------+-----------+----------+--------------+ FV Mid   Full                                                        +---------+---------------+---------+-----------+----------+--------------+ FV DistalFull                                                        +---------+---------------+---------+-----------+----------+--------------+ PFV      Full                                                        +---------+---------------+---------+-----------+----------+--------------+ POP      Full           Yes      No                                  +---------+---------------+---------+-----------+----------+--------------+ PTV      Full                                                        +---------+---------------+---------+-----------+----------+--------------+ PERO     Full                                                        +---------+---------------+---------+-----------+----------+--------------+    Summary: LEFT: - There is no evidence of deep vein thrombosis in the lower extremity.  - No cystic structure found in the popliteal  fossa.  *See table(s) above for  measurements and observations. Electronically signed by Lonni Gaskins MD on 05/27/2024 at 5:00:00 PM.    Final    DG Knee Complete 4 Views Left Result Date: 05/27/2024 CLINICAL DATA:  Leg pain. EXAM: LEFT KNEE - COMPLETE 4+ VIEW COMPARISON:  Fever radiograph 04/12/2024 FINDINGS: No evidence of fracture, dislocation, or joint effusion. No evidence of arthropathy or other focal bone abnormality. Generalized soft tissue edema. IMPRESSION: Generalized soft tissue edema. No acute osseous abnormality. Electronically Signed   By: Andrea Gasman M.D.   On: 05/27/2024 00:17   DG Knee Complete 4 Views Right Result Date: 05/27/2024 CLINICAL DATA:  Leg pain EXAM: RIGHT KNEE - COMPLETE 4+ VIEW COMPARISON:  Tibia/fibula exam 01/11/2024 FINDINGS: No evidence of fracture, dislocation, or joint effusion. Chronic fragmentation of the anterior tibial tubercle. No evidence of arthropathy or other focal bone abnormality. Mild soft tissue edema. IMPRESSION: Mild soft tissue edema. No acute osseous abnormality. Electronically Signed   By: Andrea Gasman M.D.   On: 05/27/2024 00:16   DG Pelvis Portable Result Date: 05/27/2024 CLINICAL DATA:  Hip pain. EXAM: PORTABLE PELVIS 1-2 VIEWS COMPARISON:  Pelvis radiograph 04/12/2024 FINDINGS: The cortical margins of the bony pelvis are intact. No fracture. Pubic symphysis and sacroiliac joints are congruent. Both femoral heads are well-seated in the respective acetabula. No evidence of a vascular necrosis or focal bone abnormality. IMPRESSION: Negative radiograph of the pelvis. Electronically Signed   By: Andrea Gasman M.D.   On: 05/27/2024 00:15    EKG: None  Radiology: No results found.   Procedures   Medications Ordered in the ED  Chlorhexidine  Gluconate Cloth 2 % PADS 6 each (has no administration in time range)                                    Medical Decision Making Patient has been seen multiple times this week for  missed dialysis   Amount and/or Complexity of Data Reviewed External Data Reviewed: labs and notes.    Details: Previous notes and labs reviewed  Labs: ordered.    Details: Normal sodium 144, normal potassium 5, elevated BUN 104 and creatinine 15.8 normal white count 8.2, low hemoglobin 9.1, normal platelets   Risk Risk Details: PO challenged in the department, awaiting transfer to dialysis     Final diagnoses:  None   Signed out to Dr. Garrick pending transfer to dialysis .    ED Discharge Orders     None          Tura Roller, MD 06/17/24 9490  "

## 2024-06-17 NOTE — Progress Notes (Addendum)
 Pt receives out-pt HD at [facility], TTS, 1140am chair time. Clinic confirmed his last tx with them was 12/31. Will continue to assist as needed.    Lavanda Triston Lisanti Dialysis Navigator [phone]   Addendum/late note entry. 2:29pm Noted that pt was escorted out of [facility]. Contacted [facility] and informed pt did not receive full HD tx. No further support needed.

## 2024-06-18 LAB — CULTURE, BLOOD (ROUTINE X 2)
Culture: NO GROWTH
Culture: NO GROWTH
Special Requests: ADEQUATE
Special Requests: ADEQUATE

## 2024-07-03 ENCOUNTER — Other Ambulatory Visit: Payer: Self-pay

## 2024-07-03 ENCOUNTER — Emergency Department (HOSPITAL_COMMUNITY)
Admission: EM | Admit: 2024-07-03 | Discharge: 2024-07-03 | Discharge status: Against Medical Advice | Payer: MEDICAID | Attending: Emergency Medicine | Admitting: Emergency Medicine

## 2024-07-03 ENCOUNTER — Emergency Department (HOSPITAL_COMMUNITY): Payer: MEDICAID

## 2024-07-03 ENCOUNTER — Encounter (HOSPITAL_COMMUNITY): Payer: Self-pay

## 2024-07-03 DIAGNOSIS — Z5321 Procedure and treatment not carried out due to patient leaving prior to being seen by health care provider: Secondary | ICD-10-CM | POA: Insufficient documentation

## 2024-07-03 DIAGNOSIS — N50819 Testicular pain, unspecified: Secondary | ICD-10-CM | POA: Insufficient documentation

## 2024-07-03 LAB — CBG MONITORING, ED: Glucose-Capillary: 77 mg/dL (ref 70–99)

## 2024-07-03 LAB — CBC WITH DIFFERENTIAL/PLATELET
Abs Immature Granulocytes: 0.03 10*3/uL (ref 0.00–0.07)
Basophils Absolute: 0.1 10*3/uL (ref 0.0–0.1)
Basophils Relative: 1 %
Eosinophils Absolute: 0.1 10*3/uL (ref 0.0–0.5)
Eosinophils Relative: 2 %
HCT: 25.8 % — ABNORMAL LOW (ref 39.0–52.0)
Hemoglobin: 8.2 g/dL — ABNORMAL LOW (ref 13.0–17.0)
Immature Granulocytes: 0 %
Lymphocytes Relative: 15 %
Lymphs Abs: 1.3 10*3/uL (ref 0.7–4.0)
MCH: 29.6 pg (ref 26.0–34.0)
MCHC: 31.8 g/dL (ref 30.0–36.0)
MCV: 93.1 fL (ref 80.0–100.0)
Monocytes Absolute: 0.9 10*3/uL (ref 0.1–1.0)
Monocytes Relative: 11 %
Neutro Abs: 6.2 10*3/uL (ref 1.7–7.7)
Neutrophils Relative %: 71 %
Platelets: 245 10*3/uL (ref 150–400)
RBC: 2.77 MIL/uL — ABNORMAL LOW (ref 4.22–5.81)
RDW: 16 % — ABNORMAL HIGH (ref 11.5–15.5)
WBC: 8.7 10*3/uL (ref 4.0–10.5)
nRBC: 0 % (ref 0.0–0.2)

## 2024-07-03 LAB — BASIC METABOLIC PANEL WITH GFR
Anion gap: 20 — ABNORMAL HIGH (ref 5–15)
BUN: 116 mg/dL — ABNORMAL HIGH (ref 6–20)
CO2: 14 mmol/L — ABNORMAL LOW (ref 22–32)
Calcium: 7.3 mg/dL — ABNORMAL LOW (ref 8.9–10.3)
Chloride: 108 mmol/L (ref 98–111)
Creatinine, Ser: 15.6 mg/dL — ABNORMAL HIGH (ref 0.61–1.24)
GFR, Estimated: 4 mL/min — ABNORMAL LOW
Glucose, Bld: 66 mg/dL — ABNORMAL LOW (ref 70–99)
Potassium: 5.3 mmol/L — ABNORMAL HIGH (ref 3.5–5.1)
Sodium: 142 mmol/L (ref 135–145)

## 2024-07-03 MED ORDER — ANTICOAGULANT SODIUM CITRATE 4% (200MG/5ML) IV SOLN: 5.0000 mL | Status: DC | PRN

## 2024-07-03 MED ORDER — SODIUM CHLORIDE 0.9 % IV SOLN
8.0000 mg | Freq: Three times a day (TID) | INTRAVENOUS | Status: DC | PRN
  Administered 2024-07-03: 8 mg via INTRAVENOUS
  Filled 2024-07-03: qty 4

## 2024-07-03 MED ORDER — DARBEPOETIN ALFA 100 MCG/0.5ML IJ SOSY
100.0000 ug | PREFILLED_SYRINGE | Freq: Once | INTRAMUSCULAR | Status: DC
  Filled 2024-07-03: qty 0.5

## 2024-07-03 MED ORDER — LIDOCAINE HCL (PF) 1 % IJ SOLN: 5.0000 mL | INTRAMUSCULAR | Status: DC | PRN

## 2024-07-03 MED ORDER — CHLORHEXIDINE GLUCONATE CLOTH 2 % EX PADS: 6.0000 | MEDICATED_PAD | Freq: Every day | CUTANEOUS | Status: DC

## 2024-07-03 MED ORDER — HEPARIN SODIUM (PORCINE) 1000 UNIT/ML DIALYSIS: 1000.0000 [IU] | INTRAMUSCULAR | Status: DC | PRN

## 2024-07-03 MED ORDER — ALTEPLASE 2 MG IJ SOLR: 2.0000 mg | Freq: Once | INTRAMUSCULAR | Status: DC | PRN

## 2024-07-03 MED ORDER — PENTAFLUOROPROP-TETRAFLUOROETH EX AERO: 1.0000 | INHALATION_SPRAY | CUTANEOUS | Status: DC | PRN

## 2024-07-03 MED ORDER — LIDOCAINE-PRILOCAINE 2.5-2.5 % EX CREA: 1.0000 | TOPICAL_CREAM | CUTANEOUS | Status: DC | PRN

## 2024-07-03 NOTE — ED Notes (Signed)
 Sleeping, arousable to physical stimuli, awakened, updated, fell back to sleep. Denies sx or complaints. Preoccupied with sleep. VSS.

## 2024-07-03 NOTE — Consult Note (Addendum)
 Reason for Consult: ESRD Referring Physician: Dr. Haze  Chief Complaint: Scrotal swelling  OP HD: MWF [facility]   4h   B400  79.8 kg  2K bath  lt BCF Heparin  3400  Last outpt tx on 12/31  leaving early at 81.6 kg Mircera last outpt dose on 11/29 Calcitriol  1.61mcg  Assessment/Plan: Scrotal swelling: secondary to severe Beltrami with dialysis treatments missing as well as SO very early in his treatment. ESRD: on HD TTS. Per pt last OP HD was 12/31 and he signed off early as well (under 2hrs). Current EDW listed as 79.8kg. He's basically been coming to the ED for treatments. Plan on dialysis today   Additional recommendations - Dose all meds for creatinine clearance < 10 ml/min  - Unless absolutely necessary, no MRIs with gadolinium.  - Prefer needle sticks in the dorsum of the hands or wrists.  No blood pressure measurements in right arm. - If blood transfusion is requested during hemodialysis sessions, please alert us  prior to the session.    Avoid nephrotoxic medications including NSAIDs and iodinated intravenous contrast exposure unless the latter is absolutely indicated.  Preferred narcotic agents for pain control are hydromorphone , fentanyl , and methadone. Morphine  should not be used. Avoid Baclofen and avoid oral sodium phosphate  and magnesium citrate based laxatives / bowel preps. Continue strict Input and Output monitoring. Will monitor the patient closely with you and intervene or adjust therapy as indicated by changes in clinical status/labs    HTN: Blood pressures slightly high, follow. Volume: Max UF 3-4 liters with HD. 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 male with EOH abuse, LBP, depression, ESRD patient who misses treatments routinely citing issues with transportation and not being given bus passes. Here's here with SOB, cough, last outpt treatment on 12/31. Also signs off  early routinely and he left outpt dialysis on 12/31 at 81.6 kg which is above his EDW. Patient here with scrotal swelling as well as lower extremity swelling after missing dialysis with last dialysis here in the hospital on Thursday a week ago.  Patient's last outpatient dialysis was on 06/04/2024.  He notoriously misses his dialysis as well as signs off early sometimes even after under 2 hours of treatment.  He is homeless and often leaves AMA as well, history of cocaine  abuse, osteoarthritis, tobacco abuse, alcohol abuse.  ROS Pertinent items are noted in HPI.  Chemistry and CBC: Creatinine, Ser  Date/Time Value Ref Range Status  07/03/2024 06:25 AM 15.60 (H) 0.61 - 1.24 mg/dL Final  98/86/7973 91:45 AM 15.80 (H) 0.61 - 1.24 mg/dL Final  98/87/7973 97:74 PM 17.40 (H) 0.61 - 1.24 mg/dL Final  98/87/7973 97:97 PM 15.80 (H) 0.61 - 1.24 mg/dL Final  98/88/7973 89:56 PM 16.30 (H) 0.61 - 1.24 mg/dL Final  98/89/7973 88:42 PM 15.30 (H) 0.61 - 1.24 mg/dL Final  98/90/7973 95:99 AM 14.20 (H) 0.61 - 1.24 mg/dL Final  98/91/7973 89:48 PM 14.60 (H) 0.61 - 1.24 mg/dL Final  98/98/7973 97:49 PM 12.10 (H) 0.61 - 1.24 mg/dL Final  87/71/7974 90:96 PM 15.30 (H) 0.61 - 1.24 mg/dL Final  87/72/7974 98:94 AM 14.80 (H) 0.61 - 1.24 mg/dL Final  87/74/7974 91:50 PM 15.80 (H) 0.61 - 1.24 mg/dL Final  87/74/7974 91:73 PM 14.50 (H) 0.61 - 1.24 mg/dL Final  87/77/7974 88:58 PM 13.50 (H) 0.61 - 1.24 mg/dL Final  87/91/7974 92:76 PM 12.86 (H)  0.61 - 1.24 mg/dL Final  87/91/7974 87:64 AM 12.50 (H) 0.61 - 1.24 mg/dL Final  87/91/7974 87:93 AM 12.25 (H) 0.61 - 1.24 mg/dL Final  87/98/7974 90:61 PM 13.07 (H) 0.61 - 1.24 mg/dL Final  88/74/7974 88:46 PM 11.59 (H) 0.61 - 1.24 mg/dL Final  88/75/7974 90:90 PM 13.95 (H) 0.61 - 1.24 mg/dL Final  88/77/7974 89:80 PM 14.13 (H) 0.61 - 1.24 mg/dL Final  88/79/7974 96:72 PM 13.72 (H) 0.61 - 1.24 mg/dL Final  88/91/7974 89:89 AM 14.00 (H) 0.61 - 1.24 mg/dL Final  88/91/7974  89:97 AM 13.19 (H) 0.61 - 1.24 mg/dL Final  89/78/7974 92:72 PM 13.76 (H) 0.61 - 1.24 mg/dL Final  89/79/7974 90:81 PM 13.30 (H) 0.61 - 1.24 mg/dL Final  89/81/7974 91:46 PM 14.09 (H) 0.61 - 1.24 mg/dL Final  93/90/7974 88:47 AM 13.74 (H) 0.61 - 1.24 mg/dL Final  96/70/7974 91:86 AM 11.43 (H) 0.61 - 1.24 mg/dL Final  96/71/7974 95:74 PM 11.35 (H) 0.61 - 1.24 mg/dL Final  96/71/7974 94:79 AM 10.81 (H) 0.61 - 1.24 mg/dL Final  97/72/7974 95:88 AM 13.50 (H) 0.61 - 1.24 mg/dL Final  97/72/7974 95:95 AM 11.70 (H) 0.61 - 1.24 mg/dL Final  90/69/7975 87:82 AM 10.53 (H) 0.61 - 1.24 mg/dL Final  97/85/7975 92:77 AM 10.00 (H) 0.61 - 1.24 mg/dL Final  98/78/7975 98:50 PM 9.36 (H) 0.61 - 1.24 mg/dL Final  87/91/7976 97:61 PM 10.51 (H) 0.76 - 1.27 mg/dL Final    Comment:    **Verified by repeat analysis**  04/04/2022 04:28 AM 8.74 (H) 0.61 - 1.24 mg/dL Final  89/69/7976 96:53 AM 9.85 (H) 0.61 - 1.24 mg/dL Final  89/70/7976 96:50 AM 9.27 (H) 0.61 - 1.24 mg/dL Final  89/71/7976 97:79 AM 11.27 (H) 0.61 - 1.24 mg/dL Final  89/72/7976 95:79 AM 10.59 (H) 0.61 - 1.24 mg/dL Final  89/73/7976 96:51 PM 10.87 (H) 0.61 - 1.24 mg/dL Final  89/74/7976 89:48 AM 10.03 (H) 0.76 - 1.27 mg/dL Final    Comment:    **Verified by repeat analysis**  03/21/2022 12:19 AM 9.47 (H) 0.61 - 1.24 mg/dL Final  89/83/7976 94:58 PM 0.80 0.61 - 1.24 mg/dL Final  89/83/7976 94:81 PM 10.27 (H) 0.61 - 1.24 mg/dL Final  89/83/7976 89:45 AM 9.54 (HH) 0.40 - 1.50 mg/dL Final  88/73/7980 98:95 AM 2.90 (H) 0.61 - 1.24 mg/dL Final  88/80/7980 95:88 AM 3.06 (H) 0.61 - 1.24 mg/dL Final  88/82/7980 92:66 PM 2.96 (H) 0.61 - 1.24 mg/dL Final  87/69/7981 90:82 PM 2.80 (H) 0.61 - 1.24 mg/dL Final   Recent Labs  Lab 07/03/24 0625  NA 142  K 5.3*  CL 108  CO2 14*  GLUCOSE 66*  BUN 116*  CREATININE 15.60*  CALCIUM 7.3*   Recent Labs  Lab 07/03/24 0625  WBC 8.7  NEUTROABS 6.2  HGB 8.2*  HCT 25.8*  MCV 93.1  PLT 245   Liver  Function Tests: No results for input(s): AST, ALT, ALKPHOS, BILITOT, PROT, ALBUMIN in the last 168 hours. No results for input(s): LIPASE, AMYLASE in the last 168 hours. No results for input(s): AMMONIA in the last 168 hours. Cardiac Enzymes: No results for input(s): CKTOTAL, CKMB, CKMBINDEX, TROPONINI in the last 168 hours. Iron  Studies: No results for input(s): IRON , TIBC, TRANSFERRIN, FERRITIN in the last 72 hours. PT/INR: @LABRCNTIP (inr:5)  Xrays/Other Studies: ) Results for orders placed or performed during the hospital encounter of 07/03/24 (from the past 48 hours)  Basic metabolic panel     Status: Abnormal   Collection  Time: 07/03/24  6:25 AM  Result Value Ref Range   Sodium 142 135 - 145 mmol/L   Potassium 5.3 (H) 3.5 - 5.1 mmol/L   Chloride 108 98 - 111 mmol/L   CO2 14 (L) 22 - 32 mmol/L   Glucose, Bld 66 (L) 70 - 99 mg/dL    Comment: Glucose reference range applies only to samples taken after fasting for at least 8 hours.   BUN 116 (H) 6 - 20 mg/dL   Creatinine, Ser 84.39 (H) 0.61 - 1.24 mg/dL   Calcium 7.3 (L) 8.9 - 10.3 mg/dL   GFR, Estimated 4 (L) >60 mL/min    Comment: (NOTE) Calculated using the CKD-EPI Creatinine Equation (2021)    Anion gap 20 (H) 5 - 15    Comment: Performed at [facility], [address], [place], [place] [address]  CBC with Differential     Status: Abnormal   Collection Time: 07/03/24  6:25 AM  Result Value Ref Range   WBC 8.7 4.0 - 10.5 K/uL   RBC 2.77 (L) 4.22 - 5.81 MIL/uL   Hemoglobin 8.2 (L) 13.0 - 17.0 g/dL   HCT 74.1 (L) 60.9 - 47.9 %   MCV 93.1 80.0 - 100.0 fL   MCH 29.6 26.0 - 34.0 pg   MCHC 31.8 30.0 - 36.0 g/dL   RDW 83.9 (H) 88.4 - 84.4 %   Platelets 245 150 - 400 K/uL   nRBC 0.0 0.0 - 0.2 %   Neutrophils Relative % 71 %   Neutro Abs 6.2 1.7 - 7.7 K/uL   Lymphocytes Relative 15 %   Lymphs Abs 1.3 0.7 - 4.0 K/uL   Monocytes Relative 11 %   Monocytes Absolute 0.9 0.1 - 1.0 K/uL    Eosinophils Relative 2 %   Eosinophils Absolute 0.1 0.0 - 0.5 K/uL   Basophils Relative 1 %   Basophils Absolute 0.1 0.0 - 0.1 K/uL   Immature Granulocytes 0 %   Abs Immature Granulocytes 0.03 0.00 - 0.07 K/uL    Comment: Performed at [facility], [address], [place], [place] [address]   US  SCROTUM W/DOPPLER Result Date: 07/03/2024 EXAM: ULTRASOUND SCROTUM/TESTICLES WITH DOPPLER FLOW EVALUATION 07/03/2024 07:17:16 AM TECHNIQUE: Ultrasound using B-mode/gray scaled imaging, Doppler spectral analysis and color flow Doppler was obtained of the scrotum. COMPARISON: CT Abdomen Pelvis 08/14/2015. CLINICAL HISTORY: Pain and swelling. FINDINGS: RIGHT: GREY SCALE: The right testicle measures 3.7 x 3.0 x 2.5 cm. It demonstrates normal homogeneous echotexture without focal lesion. No testicular microlithiasis. DOPPLER EVALUATION: There is normal arterial and venous Doppler flow within the testicle. SCROTUM: No varicocele. No hydrocele. EPIDIDYMIS: No acute abnormality. LEFT: GREY SCALE: The left testicle measures 3.6 x 2.6 x 2.8 cm. It demonstrates normal homogeneous echotexture without focal lesion. No testicular microlithiasis. DOPPLER EVALUATION: There is normal arterial and venous Doppler flow within the testicle. SCROTUM: No varicocele. No hydrocele. EPIDIDYMIS: No acute abnormality. IMPRESSION: 1. No acute findings. Electronically signed by: Evalene Coho MD 07/03/2024 07:27 AM EST RP Workstation: [id number]    PMH:   Past Medical History:  Diagnosis Date   Alcohol abuse    Arthritis    Asthma    Back pain    Chronic kidney disease (CKD)    Stage 5 Dialysis Tu/Th/Sa   Depression    GERD (gastroesophageal reflux disease)    Hypertension    Knee joint pain, left    Neck pain    Pneumonia    PONV (postoperative  nausea and vomiting)    Psychiatric problem    unspecified by patient (receives injections bi-weekly)   Tobacco abuse     PSH:   Past Surgical History:  Procedure  Laterality Date   arm surgery     AV FISTULA PLACEMENT Left 07/19/2022   Procedure: LEFT ARM ARTERIOVENOUS (AV) FISTULA CREATION;  Surgeon: Serene Gaile ORN, MD;  Location: [facility] OR;  Service: Vascular;  Laterality: Left;   IR FLUORO GUIDE CV LINE RIGHT  03/31/2022   IR PERC TUN PERIT CATH WO PORT S&I /IMAG  03/31/2022   IR US  GUIDE VASC ACCESS RIGHT  03/31/2022    Allergies: Allergies[1]  Medications:   Prior to Admission medications  Medication Sig Start Date End Date Taking? Authorizing Provider  acetaminophen  (TYLENOL ) 500 MG tablet Take 500 mg by mouth every 6 (six) hours as needed for moderate pain.    [provider]  amLODipine  (NORVASC ) 5 MG tablet Take 1 tablet (5 mg total) by mouth daily. 04/14/24   Paseda, Folashade R, FNP  amoxicillin -clavulanate (AUGMENTIN ) 875-125 MG tablet Take 1 tablet by mouth every 12 (twelve) hours. 06/13/24   Emil Share, DO  azithromycin  (ZITHROMAX ) 250 MG tablet Take 1 tablet (250 mg total) by mouth daily. Take first 2 tablets together, then 1 every day until finished. 06/13/24   Emil Share, DO  benztropine  (COGENTIN ) 1 MG tablet Take 1 mg by mouth 2 (two) times daily.    [provider]  cinacalcet  (SENSIPAR ) 30 MG tablet Take 1 tablet (30 mg total) by mouth every evening. 11/16/23   Paseda, Folashade R, FNP  citalopram  (CELEXA ) 20 MG tablet Take 1 tablet (20 mg total) by mouth every morning. 11/16/23   Paseda, Folashade R, FNP  doxycycline  (VIBRAMYCIN ) 100 MG capsule Take 1 capsule (100 mg total) by mouth 2 (two) times daily. 06/15/24   Ruthell Lonni FALCON, PA-C  ibuprofen  (ADVIL ) 600 MG tablet Take 1 tablet (600 mg total) by mouth every 8 (eight) hours as needed (severe pain 7-10). 10/23/23   Paseda, Folashade R, FNP  meclizine  (ANTIVERT ) 25 MG tablet Take 1 tablet (25 mg total) by mouth 3 (three) times daily as needed for dizziness. 04/30/24   Vicky Charleston, PA-C  sevelamer carbonate (RENVELA) 800 MG tablet Take 2,400 mg by mouth 3 (three)  times daily with meals.    [provider]  valbenazine  (INGREZZA ) 80 MG capsule Samples of this drug were given to the patient, quantity 1, Lot Number [id number] Patient taking differently: Take 80 mg by mouth daily. 11/27/22   TatAsberry RAMAN, DO    Discontinued Meds:  There are no discontinued medications.  Social History:  reports that he has been smoking cigarettes. He has never been exposed to tobacco smoke. He has never used smokeless tobacco. He reports that he does not currently use alcohol. He reports current drug use. Drug: Marijuana.  Family History:   Family History  Problem Relation Age of Onset   Diabetes Mother    Diabetes Father    CVA Father    Dementia Maternal Grandmother    Kidney disease Maternal Grandmother    Heart attack Neg Hx     Blood pressure (!) 161/102, pulse 85, temperature 98.2 F (36.8 C), resp. rate 14, height 5' 6 (1.676 m), weight 95.3 kg, SpO2 100%. Gen very somnolent but arousable, no distress Sclera anicteric, throat clear  Neck: No bruits Chest clear bilat to bases but poor air movement RRR no MRG Abd soft ntnd no  mass or ascites +bs Ext tr pitting LE/ UE edema Neuro is alert, Ox 3 , nf LUA BCF +bruit        Zekiah Caruth, LYNWOOD ORN, MD 07/03/2024, 9:49 AM      [1]  Allergies Allergen Reactions   Aspirin Swelling   Ibuprofen  Rash

## 2024-07-03 NOTE — ED Triage Notes (Signed)
 Pt BIB [facility] c/o of testicle swelling and pain since last week. EMS and pt states he has missed dialysis x3 days. A&Ox4.   EMS vitals  178/100 82 HR  98% sp02 room air  18 rr   114 cbg   Dialysis T, TH, Sat

## 2024-07-03 NOTE — ED Provider Notes (Signed)
" °  Physical Exam  BP (!) 161/102   Pulse 85   Temp 98.2 F (36.8 C)   Resp 14   Ht 5' 6 (1.676 m)   Wt 95.3 kg   SpO2 100%   BMI 33.89 kg/m   Physical Exam Vitals and nursing note reviewed.  Constitutional:      Appearance: Normal appearance.  HENT:     Head: Normocephalic and atraumatic.     Nose: Nose normal.     Mouth/Throat:     Mouth: Mucous membranes are moist.  Cardiovascular:     Rate and Rhythm: Normal rate.  Pulmonary:     Effort: Pulmonary effort is normal.  Abdominal:     General: Abdomen is flat.  Musculoskeletal:     Cervical back: Normal range of motion and neck supple.  Neurological:     Mental Status: He is alert.     Procedures  Procedures  ED Course / MDM    Medical Decision Making Amount and/or Complexity of Data Reviewed Labs: ordered. Radiology: ordered.   Patient care assumed from Kayla K. PA at shift change, please see her note for a full HPI. Briefly, patient here with with scrotal swelling, has missed dialysis approximately 3 sessions of them last dialyzed on Thursday due to lack of compliance.  Patient's plan is pending lab work along with ultrasound for further evaluation.  Patient's lab with no leukocytosis, hemoglobin is slightly decreased.  BMP with some hyperkalemia, creatinine is elevated.  Patient will need dialysis.  Ultrasound is benign.  Discussed these results with patient.  9:38 AM spoke to nephrology on-call who will dialyze patient in discharge from there.  I do not feel that patient warrants admission at this time.   Portions of this note were generated with Scientist, clinical (histocompatibility and immunogenetics). Dictation errors may occur despite best attempts at proofreading.         Josphine Laffey, PA-C 07/03/24 9061    Haze Lonni PARAS, MD 07/04/24 [phone]  "

## 2024-07-03 NOTE — ED Notes (Signed)
 Pt sleeping, resting, sonorous resps, repositions self, NAD, calm, arousable to light physical stimuli, VSS. Pending US  results.

## 2024-07-03 NOTE — Progress Notes (Signed)
 Patient has been restless while on treatment  and observed picking at and brushing off things from his head, and stretcher. Patients venous pressures were high, this nurse was checking the cannulation when he became up set and was hitting the strecher rail and raising voice. Attempted to calm patient and educate on his need for dialysis. He was cont to be loud. In fear for my safety took patient off treatment, all of his blood returned. Cn called security before decannulation happen. Was able to remove needles safely. Provider and NM made aware

## 2024-07-03 NOTE — ED Provider Notes (Addendum)
 "  EMERGENCY DEPARTMENT AT [facility] Provider Note   CSN: [id number] Arrival date & time: 07/03/24  9564     Patient presents with: Testicle Pain (Need dialysis, testicle swelling )   William Gregory is a 39 y.o. male.  Presents today for scrotal edema and pain x 1 week.  Patient normally receives dialysis Tuesday, Thursday, and Saturdays.  Patient has missed his last 3 sessions.  Patient denies fever, chills, nausea, vomiting, penile discharge, dysuria, or any other complaints at this time.    Testicle Pain       Prior to Admission medications  Medication Sig Start Date End Date Taking? Authorizing Provider  acetaminophen  (TYLENOL ) 500 MG tablet Take 500 mg by mouth every 6 (six) hours as needed for moderate pain.    [provider]  amLODipine  (NORVASC ) 5 MG tablet Take 1 tablet (5 mg total) by mouth daily. 04/14/24   Paseda, Folashade R, FNP  amoxicillin -clavulanate (AUGMENTIN ) 875-125 MG tablet Take 1 tablet by mouth every 12 (twelve) hours. 06/13/24   Emil Share, DO  azithromycin  (ZITHROMAX ) 250 MG tablet Take 1 tablet (250 mg total) by mouth daily. Take first 2 tablets together, then 1 every day until finished. 06/13/24   Emil Share, DO  benztropine  (COGENTIN ) 1 MG tablet Take 1 mg by mouth 2 (two) times daily.    [provider]  cinacalcet  (SENSIPAR ) 30 MG tablet Take 1 tablet (30 mg total) by mouth every evening. 11/16/23   Paseda, Folashade R, FNP  citalopram  (CELEXA ) 20 MG tablet Take 1 tablet (20 mg total) by mouth every morning. 11/16/23   Paseda, Folashade R, FNP  doxycycline  (VIBRAMYCIN ) 100 MG capsule Take 1 capsule (100 mg total) by mouth 2 (two) times daily. 06/15/24   Ruthell Lonni FALCON, PA-C  ibuprofen  (ADVIL ) 600 MG tablet Take 1 tablet (600 mg total) by mouth every 8 (eight) hours as needed (severe pain 7-10). 10/23/23   Paseda, Folashade R, FNP  meclizine  (ANTIVERT ) 25 MG tablet Take 1 tablet (25 mg total) by mouth 3 (three)  times daily as needed for dizziness. 04/30/24   Vicky Charleston, PA-C  sevelamer carbonate (RENVELA) 800 MG tablet Take 2,400 mg by mouth 3 (three) times daily with meals.    [provider]  valbenazine  (INGREZZA ) 80 MG capsule Samples of this drug were given to the patient, quantity 1, Lot Number [id number] Patient taking differently: Take 80 mg by mouth daily. 11/27/22   TatAsberry RAMAN, DO    Allergies: Aspirin and Ibuprofen     Review of Systems  Genitourinary:  Positive for testicular pain.    Updated Vital Signs BP (!) 198/107   Pulse 85   Temp 98.1 F (36.7 C) (Oral)   Resp 15   Ht 5' 6 (1.676 m)   Wt 95.3 kg   SpO2 99%   BMI 33.89 kg/m   Physical Exam Vitals and nursing note reviewed. Exam conducted with a chaperone present.  Constitutional:      General: He is not in acute distress.    Appearance: He is well-developed. He is not toxic-appearing.  HENT:     Head: Normocephalic and atraumatic.     Right Ear: External ear normal.     Left Ear: External ear normal.  Eyes:     Extraocular Movements: Extraocular movements intact.     Conjunctiva/sclera: Conjunctivae normal.  Cardiovascular:     Rate and Rhythm: Regular rhythm. Tachycardia present.     Pulses: Normal pulses.  Pulmonary:     Effort: Pulmonary effort is normal. No respiratory distress.     Breath sounds: Normal breath sounds.  Abdominal:     Palpations: Abdomen is soft.     Tenderness: There is no abdominal tenderness.  Genitourinary:    Comments: Extremely swollen bilateral testicles, difficult to perform detailed assessment secondary to swelling Musculoskeletal:        General: Swelling present.     Cervical back: Neck supple.     Comments: Mild swelling to bilateral lower extremities with minimal pitting.  Patient neurovascularly intact with +2 dorsalis pedis pulses bilaterally  Skin:    General: Skin is warm and dry.     Capillary Refill: Capillary refill takes less than 2 seconds.   Neurological:     General: No focal deficit present.     Mental Status: He is alert and oriented to person, place, and time.  Psychiatric:        Mood and Affect: Mood normal.     (all labs ordered are listed, but only abnormal results are displayed) Labs Reviewed  BASIC METABOLIC PANEL WITH GFR  CBC WITH DIFFERENTIAL/PLATELET  URINALYSIS, ROUTINE W REFLEX MICROSCOPIC    EKG: None  Radiology: No results found.   Procedures   Medications Ordered in the ED - No data to display                                  Medical Decision Making Amount and/or Complexity of Data Reviewed Labs: ordered. Radiology: ordered.   This patient presents to the ED for concern of dialysis need and scrotum swelling differential diagnosis includes Fournier's gangrene, hyperkalemia, scrotal swelling, calciphylaxis  Lab Tests:  I Ordered, and personally interpreted labs.  The pertinent results include: Pending   Imaging Studies ordered:  I ordered imaging studies including ultrasound torsionally rule out I independently visualized and interpreted imaging which showed pending I agree with the radiologist interpretation  Patient signed out to [place], PA-C pending labs and imaging.  Please refer to their note for full ED course.       Final diagnoses:  None    ED Discharge Orders     None          Francis Ileana SAILOR, PA-C 07/03/24 0629    Francis Ileana SAILOR, PA-C 07/03/24 9364    Haze Lonni PARAS, MD 07/04/24 [phone]  "

## 2024-07-03 NOTE — Progress Notes (Signed)
 Observed patient being aggressive towards Misty who is nurse caring for pt during dialysis. Pt was intentionally moving while receiving clear instructions not to move during venous needle adjustment, pt was also raising voice. Called security for assistance with patient during rinseback as pt was not following directions of staff and pt was displaying aggressive behavior including taunting gesturing, gang symbol flashing. Notified MD-Lin of pt treatment termination due to aggressive behavior and total treatment time. Pt escorted by security back to ED. Pt originally suppose to transport back to ED through transport service but pt refused to remain in stretcher and instead walked down with security independently to emergency room.

## 2024-07-03 NOTE — Progress Notes (Signed)
 Patient upon arrival to unit was coughing up a thick white foam. Patient ate a sandwich and drank 2 cans of shasta. No emesis noted just the thick white foam when coughs. Zofran  ran into HD lines due to lack of iv access.

## 2024-07-04 NOTE — Progress Notes (Signed)
 Late Note Entry- Jul 04, 2024  Pt was d/c from ED. Contacted [facility] this morning to be made aware of this info.   Randine Mungo Dialysis Navigator [phone]

## 2024-07-06 ENCOUNTER — Emergency Department (HOSPITAL_COMMUNITY)
Admission: EM | Admit: 2024-07-06 | Discharge: 2024-07-06 | Discharge status: Against Medical Advice | Payer: MEDICAID | Attending: Emergency Medicine | Admitting: Emergency Medicine

## 2024-07-06 ENCOUNTER — Emergency Department (HOSPITAL_COMMUNITY): Payer: MEDICAID

## 2024-07-06 ENCOUNTER — Encounter (HOSPITAL_COMMUNITY): Payer: Self-pay | Admitting: Emergency Medicine

## 2024-07-06 ENCOUNTER — Emergency Department (HOSPITAL_COMMUNITY)
Admission: EM | Admit: 2024-07-06 | Discharge: 2024-07-07 | Payer: MEDICAID | Attending: Emergency Medicine | Admitting: Emergency Medicine

## 2024-07-06 ENCOUNTER — Encounter (HOSPITAL_COMMUNITY): Payer: Self-pay

## 2024-07-06 ENCOUNTER — Other Ambulatory Visit: Payer: Self-pay

## 2024-07-06 DIAGNOSIS — J810 Acute pulmonary edema: Secondary | ICD-10-CM | POA: Insufficient documentation

## 2024-07-06 DIAGNOSIS — J45909 Unspecified asthma, uncomplicated: Secondary | ICD-10-CM | POA: Insufficient documentation

## 2024-07-06 DIAGNOSIS — N186 End stage renal disease: Secondary | ICD-10-CM | POA: Insufficient documentation

## 2024-07-06 DIAGNOSIS — Z992 Dependence on renal dialysis: Secondary | ICD-10-CM | POA: Insufficient documentation

## 2024-07-06 DIAGNOSIS — M79606 Pain in leg, unspecified: Secondary | ICD-10-CM | POA: Insufficient documentation

## 2024-07-06 DIAGNOSIS — N492 Inflammatory disorders of scrotum: Secondary | ICD-10-CM | POA: Insufficient documentation

## 2024-07-06 DIAGNOSIS — R0602 Shortness of breath: Secondary | ICD-10-CM

## 2024-07-06 DIAGNOSIS — F17200 Nicotine dependence, unspecified, uncomplicated: Secondary | ICD-10-CM | POA: Insufficient documentation

## 2024-07-06 DIAGNOSIS — Z5329 Procedure and treatment not carried out because of patient's decision for other reasons: Secondary | ICD-10-CM | POA: Insufficient documentation

## 2024-07-06 DIAGNOSIS — Z79899 Other long term (current) drug therapy: Secondary | ICD-10-CM | POA: Insufficient documentation

## 2024-07-06 DIAGNOSIS — E8770 Fluid overload, unspecified: Secondary | ICD-10-CM | POA: Insufficient documentation

## 2024-07-06 DIAGNOSIS — I120 Hypertensive chronic kidney disease with stage 5 chronic kidney disease or end stage renal disease: Secondary | ICD-10-CM | POA: Insufficient documentation

## 2024-07-06 DIAGNOSIS — J811 Chronic pulmonary edema: Secondary | ICD-10-CM

## 2024-07-06 DIAGNOSIS — Z5321 Procedure and treatment not carried out due to patient leaving prior to being seen by health care provider: Secondary | ICD-10-CM | POA: Insufficient documentation

## 2024-07-06 DIAGNOSIS — N5089 Other specified disorders of the male genital organs: Secondary | ICD-10-CM | POA: Insufficient documentation

## 2024-07-06 LAB — CBC WITH DIFFERENTIAL/PLATELET
Abs Immature Granulocytes: 0.03 10*3/uL (ref 0.00–0.07)
Basophils Absolute: 0.1 10*3/uL (ref 0.0–0.1)
Basophils Relative: 1 %
Eosinophils Absolute: 0.3 10*3/uL (ref 0.0–0.5)
Eosinophils Relative: 3 %
HCT: 25.1 % — ABNORMAL LOW (ref 39.0–52.0)
Hemoglobin: 7.9 g/dL — ABNORMAL LOW (ref 13.0–17.0)
Immature Granulocytes: 0 %
Lymphocytes Relative: 17 %
Lymphs Abs: 1.3 10*3/uL (ref 0.7–4.0)
MCH: 29.4 pg (ref 26.0–34.0)
MCHC: 31.5 g/dL (ref 30.0–36.0)
MCV: 93.3 fL (ref 80.0–100.0)
Monocytes Absolute: 0.9 10*3/uL (ref 0.1–1.0)
Monocytes Relative: 11 %
Neutro Abs: 5.4 10*3/uL (ref 1.7–7.7)
Neutrophils Relative %: 68 %
Platelets: 250 10*3/uL (ref 150–400)
RBC: 2.69 MIL/uL — ABNORMAL LOW (ref 4.22–5.81)
RDW: 16.1 % — ABNORMAL HIGH (ref 11.5–15.5)
WBC: 8 10*3/uL (ref 4.0–10.5)
nRBC: 0 % (ref 0.0–0.2)

## 2024-07-06 LAB — BLOOD GAS, VENOUS
Acid-base deficit: 8.6 mmol/L — ABNORMAL HIGH (ref 0.0–2.0)
Bicarbonate: 18.4 mmol/L — ABNORMAL LOW (ref 20.0–28.0)
O2 Saturation: 34.4 %
Patient temperature: 37
pCO2, Ven: 44 mmHg (ref 44–60)
pH, Ven: 7.23 — ABNORMAL LOW (ref 7.25–7.43)
pO2, Ven: 32 mmHg (ref 32–45)

## 2024-07-06 LAB — COMPREHENSIVE METABOLIC PANEL WITH GFR
ALT: 75 U/L — ABNORMAL HIGH (ref 0–44)
AST: 57 U/L — ABNORMAL HIGH (ref 15–41)
Albumin: 3.2 g/dL — ABNORMAL LOW (ref 3.5–5.0)
Alkaline Phosphatase: 109 U/L (ref 38–126)
Anion gap: 17 — ABNORMAL HIGH (ref 5–15)
BUN: 107 mg/dL — ABNORMAL HIGH (ref 6–20)
CO2: 17 mmol/L — ABNORMAL LOW (ref 22–32)
Calcium: 7.7 mg/dL — ABNORMAL LOW (ref 8.9–10.3)
Chloride: 109 mmol/L (ref 98–111)
Creatinine, Ser: 13.8 mg/dL — ABNORMAL HIGH (ref 0.61–1.24)
GFR, Estimated: 4 mL/min — ABNORMAL LOW
Glucose, Bld: 90 mg/dL (ref 70–99)
Potassium: 5 mmol/L (ref 3.5–5.1)
Sodium: 143 mmol/L (ref 135–145)
Total Bilirubin: 0.3 mg/dL (ref 0.0–1.2)
Total Protein: 5.9 g/dL — ABNORMAL LOW (ref 6.5–8.1)

## 2024-07-06 LAB — I-STAT CHEM 8, ED
BUN: 119 mg/dL — ABNORMAL HIGH (ref 6–20)
Calcium, Ion: 1.01 mmol/L — ABNORMAL LOW (ref 1.15–1.40)
Chloride: 113 mmol/L — ABNORMAL HIGH (ref 98–111)
Creatinine, Ser: 15.3 mg/dL — ABNORMAL HIGH (ref 0.61–1.24)
Glucose, Bld: 79 mg/dL (ref 70–99)
HCT: 24 % — ABNORMAL LOW (ref 39.0–52.0)
Hemoglobin: 8.2 g/dL — ABNORMAL LOW (ref 13.0–17.0)
Potassium: 4.9 mmol/L (ref 3.5–5.1)
Sodium: 143 mmol/L (ref 135–145)
TCO2: 18 mmol/L — ABNORMAL LOW (ref 22–32)

## 2024-07-06 NOTE — ED Triage Notes (Signed)
 Patient BIB [facility] c/o leg, butt, and testicle swelling.  Patient last had dialysis 2-3 days ago per patient.  Patient reports he did not finish dialysis.  Patient seen 2 days ago for the same thing.

## 2024-07-06 NOTE — ED Triage Notes (Signed)
 Pt BIB EMS due to leg swelling, SOB, cold exposure and stiffness. Pt reports SOB with exertion and not getting full dialysis treatment 3 days. Pt reports testicles swollen as well. AAOx4  BP 160/70 HR 88 SpO2 99%

## 2024-07-07 ENCOUNTER — Encounter (HOSPITAL_COMMUNITY): Payer: Self-pay

## 2024-07-07 ENCOUNTER — Other Ambulatory Visit: Payer: Self-pay

## 2024-07-07 ENCOUNTER — Emergency Department (HOSPITAL_COMMUNITY)
Admission: EM | Admit: 2024-07-07 | Discharge: 2024-07-08 | Payer: MEDICAID | Attending: Emergency Medicine | Admitting: Emergency Medicine

## 2024-07-07 DIAGNOSIS — Z992 Dependence on renal dialysis: Secondary | ICD-10-CM | POA: Insufficient documentation

## 2024-07-07 DIAGNOSIS — N5089 Other specified disorders of the male genital organs: Secondary | ICD-10-CM | POA: Insufficient documentation

## 2024-07-07 DIAGNOSIS — M79661 Pain in right lower leg: Secondary | ICD-10-CM | POA: Insufficient documentation

## 2024-07-07 DIAGNOSIS — Z5321 Procedure and treatment not carried out due to patient leaving prior to being seen by health care provider: Secondary | ICD-10-CM | POA: Insufficient documentation

## 2024-07-07 DIAGNOSIS — M79662 Pain in left lower leg: Secondary | ICD-10-CM | POA: Insufficient documentation

## 2024-07-07 LAB — CBC WITH DIFFERENTIAL/PLATELET
Abs Immature Granulocytes: 0.02 10*3/uL (ref 0.00–0.07)
Basophils Absolute: 0.1 10*3/uL (ref 0.0–0.1)
Basophils Relative: 1 %
Eosinophils Absolute: 0.2 10*3/uL (ref 0.0–0.5)
Eosinophils Relative: 2 %
HCT: 27 % — ABNORMAL LOW (ref 39.0–52.0)
Hemoglobin: 8.1 g/dL — ABNORMAL LOW (ref 13.0–17.0)
Immature Granulocytes: 0 %
Lymphocytes Relative: 17 %
Lymphs Abs: 1.4 10*3/uL (ref 0.7–4.0)
MCH: 29 pg (ref 26.0–34.0)
MCHC: 30 g/dL (ref 30.0–36.0)
MCV: 96.8 fL (ref 80.0–100.0)
Monocytes Absolute: 0.7 10*3/uL (ref 0.1–1.0)
Monocytes Relative: 9 %
Neutro Abs: 5.7 10*3/uL (ref 1.7–7.7)
Neutrophils Relative %: 71 %
Platelets: 238 10*3/uL (ref 150–400)
RBC: 2.79 MIL/uL — ABNORMAL LOW (ref 4.22–5.81)
RDW: 16 % — ABNORMAL HIGH (ref 11.5–15.5)
WBC: 8.1 10*3/uL (ref 4.0–10.5)
nRBC: 0 % (ref 0.0–0.2)

## 2024-07-07 LAB — RENAL FUNCTION PANEL
Albumin: 3.3 g/dL — ABNORMAL LOW (ref 3.5–5.0)
Anion gap: 17 — ABNORMAL HIGH (ref 5–15)
BUN: 104 mg/dL — ABNORMAL HIGH (ref 6–20)
CO2: 18 mmol/L — ABNORMAL LOW (ref 22–32)
Calcium: 7.9 mg/dL — ABNORMAL LOW (ref 8.9–10.3)
Chloride: 110 mmol/L (ref 98–111)
Creatinine, Ser: 14.3 mg/dL — ABNORMAL HIGH (ref 0.61–1.24)
GFR, Estimated: 4 mL/min — ABNORMAL LOW
Glucose, Bld: 89 mg/dL (ref 70–99)
Phosphorus: 8.7 mg/dL — ABNORMAL HIGH (ref 2.5–4.6)
Potassium: 5.1 mmol/L (ref 3.5–5.1)
Sodium: 145 mmol/L (ref 135–145)

## 2024-07-07 LAB — RESP PANEL BY RT-PCR (RSV, FLU A&B, COVID)  RVPGX2
Influenza A by PCR: NEGATIVE
Influenza B by PCR: NEGATIVE
Resp Syncytial Virus by PCR: NEGATIVE
SARS Coronavirus 2 by RT PCR: NEGATIVE

## 2024-07-07 MED ORDER — HEPARIN SODIUM (PORCINE) 1000 UNIT/ML DIALYSIS: 1000.0000 [IU] | INTRAMUSCULAR | Status: DC | PRN

## 2024-07-07 MED ORDER — CHLORHEXIDINE GLUCONATE CLOTH 2 % EX PADS: 6.0000 | MEDICATED_PAD | Freq: Every day | CUTANEOUS | Status: DC

## 2024-07-07 MED ORDER — ANTICOAGULANT SODIUM CITRATE 4% (200MG/5ML) IV SOLN: 5.0000 mL | Status: DC | PRN

## 2024-07-07 MED ORDER — AMLODIPINE BESYLATE 5 MG PO TABS
5.0000 mg | ORAL_TABLET | Freq: Once | ORAL | Status: AC
  Administered 2024-07-07: 5 mg via ORAL
  Filled 2024-07-07: qty 1

## 2024-07-07 MED ORDER — ALTEPLASE 2 MG IJ SOLR: 2.0000 mg | Freq: Once | INTRAMUSCULAR | Status: DC | PRN

## 2024-07-07 MED ORDER — PENTAFLUOROPROP-TETRAFLUOROETH EX AERO: 1.0000 | INHALATION_SPRAY | CUTANEOUS | Status: DC | PRN

## 2024-07-07 MED ORDER — LIDOCAINE-PRILOCAINE 2.5-2.5 % EX CREA: 1.0000 | TOPICAL_CREAM | CUTANEOUS | Status: DC | PRN

## 2024-07-07 MED ORDER — LIDOCAINE HCL (PF) 1 % IJ SOLN: 5.0000 mL | INTRAMUSCULAR | Status: DC | PRN

## 2024-07-07 NOTE — ED Triage Notes (Addendum)
 Patient here for bilateral leg pain and reported testicle swelling. Patient seen here frequently for same, hx of dialysis and has not missed any treatments. Patient has hx of being violent towards staff. BP 168/80 HR 88 100% RA RR 18

## 2024-07-07 NOTE — ED Provider Notes (Addendum)
" °  Physical Exam  BP (!) 171/95 (BP Location: Right Arm)   Pulse 84   Temp 98.1 F (36.7 C) (Oral)   Resp 18   SpO2 99%   Physical Exam  Procedures  Procedures  ED Course / MDM    Medical Decision Making Amount and/or Complexity of Data Reviewed Labs: ordered. Radiology: ordered.  Risk Prescription drug management.   Patient now refusing to go to [facility] for dialysis.  States he just dialyzed last week.  Awake and appears to have ability to make decisions.  Informed of need for dialysis but patient refuses.  Will discharge AMA.  Aware of risks including death.       Patsey Lot, MD 07/07/24 [phone]  Patient now has changed his mind and says he will go.  Unsure if [facility] has left however.   Patsey Lot, MD 07/07/24 [phone]  "

## 2024-07-07 NOTE — ED Notes (Signed)
 Patient refused to sit on stretcher yo have PIV removed. Cursed and walked out-

## 2024-07-07 NOTE — ED Notes (Signed)
 Patient requested to go to the waiting room, refusing to answer nurses questions at this time, pre-occupied with his snacks and shirt.

## 2024-07-07 NOTE — ED Notes (Signed)
 Pt refused to go to [facility] for higher level of care (dialysis) with [facility]. Pt stated I'm not going with yall and leave my shit alone!SABRA Oas RN and MD made aware.

## 2024-07-07 NOTE — ED Notes (Signed)
 Pt continues to take BP cuff and spo2 sensor off while sleeping in room

## 2024-07-07 NOTE — ED Notes (Signed)
 Security escorted pt outside where he said he would wait for his name to be called.

## 2024-07-08 ENCOUNTER — Emergency Department (HOSPITAL_COMMUNITY)
Admission: EM | Admit: 2024-07-08 | Discharge: 2024-07-08 | Disposition: A | Discharge status: Home / Self Care | Payer: MEDICAID | Attending: Emergency Medicine | Admitting: Emergency Medicine

## 2024-07-08 ENCOUNTER — Encounter (HOSPITAL_COMMUNITY): Payer: Self-pay

## 2024-07-08 DIAGNOSIS — N186 End stage renal disease: Secondary | ICD-10-CM | POA: Insufficient documentation

## 2024-07-08 DIAGNOSIS — Z992 Dependence on renal dialysis: Secondary | ICD-10-CM | POA: Insufficient documentation

## 2024-07-08 DIAGNOSIS — R451 Restlessness and agitation: Secondary | ICD-10-CM | POA: Insufficient documentation

## 2024-07-08 DIAGNOSIS — R600 Localized edema: Secondary | ICD-10-CM

## 2024-07-08 DIAGNOSIS — D649 Anemia, unspecified: Secondary | ICD-10-CM | POA: Insufficient documentation

## 2024-07-08 DIAGNOSIS — Z79899 Other long term (current) drug therapy: Secondary | ICD-10-CM | POA: Insufficient documentation

## 2024-07-08 DIAGNOSIS — I120 Hypertensive chronic kidney disease with stage 5 chronic kidney disease or end stage renal disease: Secondary | ICD-10-CM | POA: Insufficient documentation

## 2024-07-08 LAB — CBC
HCT: 23.7 % — ABNORMAL LOW (ref 39.0–52.0)
Hemoglobin: 7.6 g/dL — ABNORMAL LOW (ref 13.0–17.0)
MCH: 29.6 pg (ref 26.0–34.0)
MCHC: 32.1 g/dL (ref 30.0–36.0)
MCV: 92.2 fL (ref 80.0–100.0)
Platelets: 225 10*3/uL (ref 150–400)
RBC: 2.57 MIL/uL — ABNORMAL LOW (ref 4.22–5.81)
RDW: 16.1 % — ABNORMAL HIGH (ref 11.5–15.5)
WBC: 7.9 10*3/uL (ref 4.0–10.5)
nRBC: 0 % (ref 0.0–0.2)

## 2024-07-08 LAB — BASIC METABOLIC PANEL WITH GFR
Anion gap: 18 — ABNORMAL HIGH (ref 5–15)
BUN: 109 mg/dL — ABNORMAL HIGH (ref 6–20)
CO2: 15 mmol/L — ABNORMAL LOW (ref 22–32)
Calcium: 7 mg/dL — ABNORMAL LOW (ref 8.9–10.3)
Chloride: 110 mmol/L (ref 98–111)
Creatinine, Ser: 14.3 mg/dL — ABNORMAL HIGH (ref 0.61–1.24)
GFR, Estimated: 4 mL/min — ABNORMAL LOW
Glucose, Bld: 109 mg/dL — ABNORMAL HIGH (ref 70–99)
Potassium: 5 mmol/L (ref 3.5–5.1)
Sodium: 143 mmol/L (ref 135–145)

## 2024-07-08 MED ORDER — CHLORHEXIDINE GLUCONATE CLOTH 2 % EX PADS: 6.0000 | MEDICATED_PAD | Freq: Every day | CUTANEOUS | Status: DC

## 2024-07-08 MED ORDER — ACETAMINOPHEN 500 MG PO TABS: 1000.0000 mg | ORAL_TABLET | Freq: Once | ORAL | Status: DC

## 2024-07-08 NOTE — ED Provider Notes (Signed)
 " [facility] EMERGENCY DEPARTMENT AT [facility] Provider Note   CSN: [id number] Arrival date & time: 07/08/24  9668     Patient presents with: Leg Swelling and dailysis    William Gregory is a 39 y.o. male.   39 year old male with a history of substance abuse, ESRD on [facility] dialysis, HTN, GERD presents to the emergency department complaining of swelling in bilateral lower extremities as well as his scrotum.  These are ongoing issues reported for the patient.  He was last assessed for this yesterday.  Was advised to be transferred to [facility] for dialysis, but ended up leaving AMA.  He cannot recall the last time he was dialyzed in his dialysis center.  States that he does not go there because he does not trust them.  Dialyzed in hospital on 07/03/24.  Denies chest pain, SOB, vomiting, fever.  The history is provided by the patient. No language interpreter was used.       Prior to Admission medications  Medication Sig Start Date End Date Taking? Authorizing Provider  acetaminophen  (TYLENOL ) 500 MG tablet Take 500 mg by mouth every 6 (six) hours as needed for moderate pain.    [provider]  amLODipine  (NORVASC ) 5 MG tablet Take 1 tablet (5 mg total) by mouth daily. 04/14/24   Paseda, Folashade R, FNP  amoxicillin -clavulanate (AUGMENTIN ) 875-125 MG tablet Take 1 tablet by mouth every 12 (twelve) hours. 06/13/24   Emil Share, DO  azithromycin  (ZITHROMAX ) 250 MG tablet Take 1 tablet (250 mg total) by mouth daily. Take first 2 tablets together, then 1 every day until finished. 06/13/24   Emil Share, DO  benztropine  (COGENTIN ) 1 MG tablet Take 1 mg by mouth 2 (two) times daily.    [provider]  cinacalcet  (SENSIPAR ) 30 MG tablet Take 1 tablet (30 mg total) by mouth every evening. 11/16/23   Paseda, Folashade R, FNP  citalopram  (CELEXA ) 20 MG tablet Take 1 tablet (20 mg total) by mouth every morning. 11/16/23   Paseda, Folashade R, FNP  doxycycline  (VIBRAMYCIN )  100 MG capsule Take 1 capsule (100 mg total) by mouth 2 (two) times daily. 06/15/24   Ruthell Lonni FALCON, PA-C  ibuprofen  (ADVIL ) 600 MG tablet Take 1 tablet (600 mg total) by mouth every 8 (eight) hours as needed (severe pain 7-10). 10/23/23   Paseda, Folashade R, FNP  meclizine  (ANTIVERT ) 25 MG tablet Take 1 tablet (25 mg total) by mouth 3 (three) times daily as needed for dizziness. 04/30/24   Vicky Charleston, PA-C  sevelamer carbonate (RENVELA) 800 MG tablet Take 2,400 mg by mouth 3 (three) times daily with meals.    [provider]  valbenazine  (INGREZZA ) 80 MG capsule Samples of this drug were given to the patient, quantity 1, Lot Number [id number] Patient taking differently: Take 80 mg by mouth daily. 11/27/22   TatAsberry RAMAN, DO    Allergies: Aspirin and Ibuprofen     Review of Systems Ten systems reviewed and are negative for acute change, except as noted in the HPI.    Updated Vital Signs BP 134/77   Pulse 94   Temp (!) 97.4 F (36.3 C) (Axillary)   Resp 20   Ht 5' 6 (1.676 m)   Wt 94 kg   SpO2 100%   BMI 33.45 kg/m   Physical Exam Vitals and nursing note reviewed.  Constitutional:      General: He is not in acute distress.    Appearance: He is well-developed.  He is not diaphoretic.  HENT:     Head: Normocephalic and atraumatic.  Eyes:     General: No scleral icterus.    Conjunctiva/sclera: Conjunctivae normal.  Cardiovascular:     Rate and Rhythm: Normal rate and regular rhythm.     Pulses: Normal pulses.  Pulmonary:     Effort: Pulmonary effort is normal. No respiratory distress.  Musculoskeletal:        General: Normal range of motion.     Cervical back: Normal range of motion.     Comments: Palpable thrill left upper extremity  Skin:    General: Skin is warm and dry.     Coloration: Skin is not pale.     Findings: No erythema or rash.  Neurological:     Mental Status: He is alert and oriented to person, place, and time.  Psychiatric:         Attention and Perception: He is inattentive.        Behavior: Behavior is agitated.        Judgment: Judgment is impulsive.     (all labs ordered are listed, but only abnormal results are displayed) Labs Reviewed  CBC - Abnormal; Notable for the following components:      Result Value   RBC 2.57 (*)    Hemoglobin 7.6 (*)    HCT 23.7 (*)    RDW 16.1 (*)    All other components within normal limits  BASIC METABOLIC PANEL WITH GFR - Abnormal; Notable for the following components:   CO2 15 (*)    Glucose, Bld 109 (*)    BUN 109 (*)    Creatinine, Ser 14.30 (*)    Calcium 7.0 (*)    GFR, Estimated 4 (*)    Anion gap 18 (*)    All other components within normal limits    EKG: None  Radiology: DG Chest Port 1 View Result Date: 07/06/2024 EXAM: 1 VIEW(S) XRAY OF THE CHEST 07/06/2024 11:29:26 PM COMPARISON: 06/12/2024 CLINICAL HISTORY: Shortness of breath. FINDINGS: LUNGS AND PLEURA: Hazy airspace and interstitial opacities greatest in the bilateral lower lungs. No pleural effusion. No pneumothorax. HEART AND MEDIASTINUM: Cardiomegaly. BONES AND SOFT TISSUES: No acute osseous abnormality. IMPRESSION: 1. Progressive airspace and interstitial opacities in the lower lungs may be due to worsening infection or edema. Electronically signed by: Norman Gatlin MD 07/06/2024 11:35 PM EST RP Workstation: [id number]     Procedures   Medications Ordered in the ED  acetaminophen  (TYLENOL ) tablet 1,000 mg (1,000 mg Oral Not Given 07/08/24 0518)    Clinical Course as of 07/08/24 0628  Tue Jul 08, 2024  0627 Labs are consistent with baseline.  Acidosis is chronic and related to ESRD.  No hyperkalemia.  Anemia of chronic disease is stable.  Patient resting comfortably.  Reports that he is willing to stay for dialysis prior to discharge. [KH]    Clinical Course User Index [KH] Keith Sor, PA-C                                 Medical Decision Making Amount and/or Complexity of Data  Reviewed Labs: ordered.  Risk OTC drugs.   This patient presents to the ED for concern of lower extremity and scrotal swelling, this involves an extensive number of treatment options, and is a complaint that carries with it a high risk of complications and morbidity.  The differential diagnosis includes peripheral edema vs  vs cellulitis/infection   Co morbidities that complicate the patient evaluation  Substance abuse ESRD HTN   Additional history obtained:  Additional history obtained from medical records External records from outside source obtained and reviewed including piror discharge summaries.   Lab Tests:  I Ordered, and personally interpreted labs.  The pertinent results include:  Hgb 7.6, CO2 15, Anion gap 18. BUN 109/Creatinine 14.3   Cardiac Monitoring:  The patient was maintained on a cardiac monitor.  I personally viewed and interpreted the cardiac monitored which showed an underlying rhythm of: NSR   Medicines ordered and prescription drug management:  I ordered medication including tylenol  for pain  Reevaluation of the patient after these medicines showed that the patient stayed the same I have reviewed the patients home medicines and have made adjustments as needed   Test Considered:  Renal function panel   Problem List / ED Course:  As above   Reevaluation:  After the interventions noted above, I reevaluated the patient and found that they have :stayed the same   Social Determinants of Health:  Polysubstance abuse   Dispostion:  Care signed out to DuFour, PA-C at shift change.      Final diagnoses:  ESRD on dialysis [facility])  Peripheral edema    ED Discharge Orders     None          Keith Sor, PA-C 07/08/24 [id number]    Trine Raynell Moder, MD 07/08/24 [phone]  "

## 2024-07-08 NOTE — Progress Notes (Signed)
 Psychiatric Nurse Liaison (PNL) Note    Patient Mood/Affect: Irritable/agitated  Noted Patient Behaviors: Patient is restless, agitated, and irritable. Avoids eye contact and answering questions.  Interventions Initiated by Psychiatric Nurse Liaison: Therapeutic communication  Recommendations for Patient Care: Utilize verbal de-escalation  Patients Response to Treatment:  Unchanged Time Spent with Patient:   5 mins

## 2024-07-08 NOTE — ED Triage Notes (Signed)
 Pt reports that his testicle swelling is getting worse and his blood pressure is high and he needs dialysis.

## 2024-07-08 NOTE — ED Notes (Signed)
 Patient is highly agitated, scratching his head and genital areas stated feels like something is crawling on me. Difficult for patient to remain still in bed. Allowed blood work to be drawn with period of stillness and snoring during interaction.

## 2024-07-08 NOTE — ED Notes (Signed)
 Patient asleep in bed.

## 2024-07-08 NOTE — ED Notes (Signed)
 Patient requested pulse ox be removed because it was burning his finger.

## 2024-07-08 NOTE — ED Notes (Signed)
 Patient is resting comfortably. Asleep peacefully.

## 2024-07-08 NOTE — ED Notes (Signed)
 Patient continues to rest peacefully in bed without signs of agitation.
